# Patient Record
Sex: Male | Born: 1945
Health system: Southern US, Community
[De-identification: ages and names within clinical notes are randomized; demographics above are authoritative.]

## PROBLEM LIST (undated history)

## (undated) DIAGNOSIS — G629 Polyneuropathy, unspecified: Secondary | ICD-10-CM

## (undated) DIAGNOSIS — E7800 Pure hypercholesterolemia, unspecified: Secondary | ICD-10-CM

## (undated) DIAGNOSIS — R809 Proteinuria, unspecified: Secondary | ICD-10-CM

## (undated) DIAGNOSIS — E11319 Type 2 diabetes mellitus with unspecified diabetic retinopathy without macular edema: Secondary | ICD-10-CM

## (undated) DIAGNOSIS — E119 Type 2 diabetes mellitus without complications: Secondary | ICD-10-CM

## (undated) DIAGNOSIS — H269 Unspecified cataract: Secondary | ICD-10-CM

## (undated) DIAGNOSIS — K635 Polyp of colon: Secondary | ICD-10-CM

## (undated) DIAGNOSIS — E039 Hypothyroidism, unspecified: Secondary | ICD-10-CM

## (undated) DIAGNOSIS — I350 Nonrheumatic aortic (valve) stenosis: Secondary | ICD-10-CM

## (undated) DIAGNOSIS — N189 Chronic kidney disease, unspecified: Secondary | ICD-10-CM

## (undated) DIAGNOSIS — I10 Essential (primary) hypertension: Secondary | ICD-10-CM

## (undated) DIAGNOSIS — H35039 Hypertensive retinopathy, unspecified eye: Secondary | ICD-10-CM

## (undated) DIAGNOSIS — K219 Gastro-esophageal reflux disease without esophagitis: Secondary | ICD-10-CM

## (undated) HISTORY — DX: Chronic kidney disease, unspecified: N18.9

## (undated) HISTORY — DX: Pure hypercholesterolemia, unspecified: E78.00

## (undated) HISTORY — DX: Type 2 diabetes mellitus without complications: E11.9

## (undated) HISTORY — DX: Nonrheumatic aortic (valve) stenosis: I35.0

## (undated) HISTORY — DX: Hypertensive retinopathy, unspecified eye: H35.039

## (undated) HISTORY — DX: Hypothyroidism, unspecified: E03.9

## (undated) HISTORY — DX: Essential (primary) hypertension: I10

## (undated) HISTORY — DX: Polyneuropathy, unspecified: G62.9

## (undated) HISTORY — DX: Proteinuria, unspecified: R80.9

## (undated) HISTORY — DX: Type 2 diabetes mellitus with unspecified diabetic retinopathy without macular edema: E11.319

## (undated) HISTORY — DX: Unspecified cataract: H26.9

## (undated) HISTORY — DX: Gastro-esophageal reflux disease without esophagitis: K21.9

## (undated) HISTORY — PX: CARDIAC SURGERY: SHX584

## (undated) HISTORY — DX: Polyp of colon: K63.5

---

## 2002-03-20 ENCOUNTER — Ambulatory Visit (HOSPITAL_COMMUNITY): Admission: RE | Admit: 2002-03-20 | Discharge: 2002-03-20 | Payer: Self-pay | Admitting: Gastroenterology

## 2014-10-11 ENCOUNTER — Other Ambulatory Visit: Payer: Self-pay | Admitting: Nephrology

## 2014-10-11 DIAGNOSIS — R809 Proteinuria, unspecified: Secondary | ICD-10-CM

## 2014-10-15 ENCOUNTER — Other Ambulatory Visit: Payer: Self-pay

## 2014-10-18 ENCOUNTER — Ambulatory Visit
Admission: RE | Admit: 2014-10-18 | Discharge: 2014-10-18 | Disposition: A | Discharge status: Home / Self Care | Payer: Medicare Other | Source: Ambulatory Visit | Attending: Nephrology | Admitting: Nephrology

## 2014-10-18 DIAGNOSIS — R809 Proteinuria, unspecified: Secondary | ICD-10-CM

## 2015-03-15 ENCOUNTER — Other Ambulatory Visit: Payer: Self-pay | Admitting: Nephrology

## 2015-03-15 DIAGNOSIS — R809 Proteinuria, unspecified: Secondary | ICD-10-CM

## 2015-03-21 ENCOUNTER — Other Ambulatory Visit: Payer: Medicare Other

## 2016-08-19 DIAGNOSIS — E039 Hypothyroidism, unspecified: Secondary | ICD-10-CM | POA: Diagnosis not present

## 2016-10-01 DIAGNOSIS — E038 Other specified hypothyroidism: Secondary | ICD-10-CM | POA: Diagnosis not present

## 2016-10-14 DIAGNOSIS — M79645 Pain in left finger(s): Secondary | ICD-10-CM | POA: Diagnosis not present

## 2016-10-14 DIAGNOSIS — M65322 Trigger finger, left index finger: Secondary | ICD-10-CM | POA: Diagnosis not present

## 2016-11-17 ENCOUNTER — Encounter: Payer: Self-pay | Admitting: Neurology

## 2016-12-14 DIAGNOSIS — L821 Other seborrheic keratosis: Secondary | ICD-10-CM | POA: Diagnosis not present

## 2016-12-14 DIAGNOSIS — L814 Other melanin hyperpigmentation: Secondary | ICD-10-CM | POA: Diagnosis not present

## 2016-12-14 DIAGNOSIS — D225 Melanocytic nevi of trunk: Secondary | ICD-10-CM | POA: Diagnosis not present

## 2016-12-17 DIAGNOSIS — J069 Acute upper respiratory infection, unspecified: Secondary | ICD-10-CM | POA: Diagnosis not present

## 2017-01-15 ENCOUNTER — Ambulatory Visit (INDEPENDENT_AMBULATORY_CARE_PROVIDER_SITE_OTHER): Payer: PPO | Admitting: Neurology

## 2017-01-15 ENCOUNTER — Other Ambulatory Visit (INDEPENDENT_AMBULATORY_CARE_PROVIDER_SITE_OTHER): Payer: PPO

## 2017-01-15 ENCOUNTER — Encounter: Payer: Self-pay | Admitting: Neurology

## 2017-01-15 VITALS — BP 120/78 | HR 66 | Ht 69.0 in | Wt 220.0 lb

## 2017-01-15 DIAGNOSIS — E1142 Type 2 diabetes mellitus with diabetic polyneuropathy: Secondary | ICD-10-CM

## 2017-01-15 HISTORY — DX: Type 2 diabetes mellitus with diabetic polyneuropathy: E11.42

## 2017-01-15 LAB — FOLATE: Folate: >24 ng/mL (ref >5.9)

## 2017-01-15 LAB — VITAMIN B12: VITAMIN B 12: 389 pg/mL (ref 211–911)

## 2017-01-15 MED ORDER — PREGABALIN 200 MG PO CAPS: 200.0000 mg | ORAL_CAPSULE | Freq: Two times a day (BID) | ORAL | 5 refills | Status: DC

## 2017-01-15 NOTE — Patient Instructions (Signed)
Start Lyrica 200mg  twice daily Check labs   Return to clinic 6 months

## 2017-01-15 NOTE — Progress Notes (Signed)
[facility] Clinic Note - Initial Visit   Date: 01/15/17  Chad [name] MRN: [id number] DOB: [birth year]   Dear Dr. Delfina Redwood:  Thank you for your kind referral of Chad [name] [facility] for consultation of neuropathy. Although his history is well known to you, please allow Chad [name] to reiterate it for the purpose of our medical record. The patient was accompanied to the clinic by wife who also provides collateral information.     History of Present Illness: Chad [name] is a 71 y.o. right-handed Caucasian male with well-controlled diabetes mellitus, hypertension, hypothyroidism, and hyperlipidemia presenting for evaluation of bilateral foot paresthesias.    Starting around 2016, he starting having pins and needle sensation over the soles and toes.  Symptoms are constant and worse with walking.  He is not aware of symptoms during the day, but is most bothered by it in the morning and night time.  He denies any weakness or imbalance.  He walks unassisted and has not had any falls. He has NCS/EMG of the legs performed by Dr. Domingo Cocking in June 2017 which showed severe sensorimotor polyneuropathy.  He has previously tried gabapentin, but does not recall the dose.  He takes Lyrica 75mg  twice daily and denies any side effects.   He was diagnosed with diabetes in 2008 and has been managed with oral medications.  His last HbA1c 6.2.    There is no personal history of alcoholism or family history of neuropathy.    Out-side paper records, electronic medical record, and images have been reviewed where available and summarized as:  NCS/EMG of the legs 01/09/2016:  Severe sensory and motor polyneuropathy with chronic denervation consistent with patient's known history of diabetes mellitus.     Past Medical History:  Diagnosis Date  . CKD (chronic kidney disease)   . Colon polyps   . Diabetes ([place])   . GERD (gastroesophageal reflux disease)   . Hypercholesterolemia   . Hypertension   .  Polyneuropathy   . Proteinuria    Past Surgeries: None  Medications:  Outpatient Encounter Prescriptions as of 01/15/2017  Medication Sig  . glimepiride (AMARYL) 1 MG tablet   . hydrochlorothiazide (HYDRODIURIL) 25 MG tablet   . levothyroxine (SYNTHROID, LEVOTHROID) 75 MCG tablet   . lisinopril (PRINIVIL,ZESTRIL) 40 MG tablet TAKE 1 TABLET BY MOUTH DAILY  . metFORMIN (GLUCOPHAGE) 1000 MG tablet   . simvastatin (ZOCOR) 80 MG tablet   . [DISCONTINUED] levothyroxine (SYNTHROID) 75 MCG tablet 1 tablet  . [DISCONTINUED] LYRICA 150 MG capsule   . [DISCONTINUED] pregabalin (LYRICA) 150 MG capsule 1 capsule  . pregabalin (LYRICA) 200 MG capsule Take 1 capsule (200 mg total) by mouth 2 (two) times daily.  . [DISCONTINUED] aspirin 81 MG chewable tablet 1 tablet  . [DISCONTINUED] aspirin 81 MG chewable tablet 1 tablet  . [DISCONTINUED] azithromycin (ZITHROMAX) 250 MG tablet   . [DISCONTINUED] Calcium Carbonate-Vitamin D (CALCIUM 500 + D) 500-125 MG-UNIT TABS 1 tablet  . [DISCONTINUED] Calcium Carbonate-Vitamin D (CALCIUM 500 + D) 500-125 MG-UNIT TABS 1 tablet  . [DISCONTINUED] lisinopril (PRINIVIL,ZESTRIL) 40 MG tablet   . [DISCONTINUED] metFORMIN (GLUCOPHAGE) 1000 MG tablet 1 tablet  . [DISCONTINUED] vitamin C (ASCORBIC ACID) 500 MG tablet as directed   No facility-administered encounter medications on file as of 01/15/2017.      Allergies: No Known Allergies  Family History: Father died from stroke and had diabetes. Mother died of natural causes. Sister is healthy.  Social History:   Social History  Substance  Use Topics  . Smoking status: Former Research scientist (life sciences)  . Smokeless tobacco: Never Used  . Alcohol use Yes   Social History   Social History Narrative   Patient lives with wife in a 2 story home.  Has 2 children.  Works as a Associate Professor.  Education: college.     Review of Systems:  CONSTITUTIONAL: No fevers, chills, night sweats, or weight loss.   EYES: No visual  changes or eye pain ENT: No hearing changes.  No history of nose bleeds.   RESPIRATORY: No cough, wheezing and shortness of breath.   CARDIOVASCULAR: Negative for chest pain, and palpitations.   GI: Negative for abdominal discomfort, blood in stools or black stools.  No recent change in bowel habits.   GU:  No history of incontinence.   MUSCLOSKELETAL: No history of joint pain or swelling.  No myalgias.   SKIN: Negative for lesions, rash, and itching.   HEMATOLOGY/ONCOLOGY: Negative for prolonged bleeding, bruising easily, and swollen nodes.  No history of cancer.   ENDOCRINE: Negative for cold or heat intolerance, polydipsia or goiter.   PSYCH:  No depression or anxiety symptoms.   NEURO: As Above.   Vital Signs:  BP 120/78   Pulse 66   Ht 5\' 9"  (1.753 m)   Wt 220 lb (99.8 kg)   SpO2 97%   BMI 32.49 kg/m    General Medical Exam:   General:  Well appearing, comfortable.   Eyes/ENT: see cranial nerve examination.   Neck: No masses appreciated.  Full range of motion without tenderness.  No carotid bruits. Respiratory:  Clear to auscultation, good air entry bilaterally.   Cardiac:  Regular rate and rhythm, no murmur.   Extremities:  No deformities, edema, or skin discoloration.  Skin:  No rashes or lesions.  Neurological Exam: MENTAL STATUS including orientation to time, place, person, recent and remote memory, attention span and concentration, language, and fund of knowledge is normal.  Speech is not dysarthric.  CRANIAL NERVES: II:  No visual field defects.  Unremarkable fundi.   III-IV-VI: Pupils equal round and reactive to light.  Normal conjugate, extra-ocular eye movements in all directions of gaze.  No nystagmus.  No ptosis.   V:  Normal facial sensation.   VII:  Normal facial symmetry and movements.  VIII:  Normal hearing and vestibular function.   IX-X:  Normal palatal movement.   XI:  Normal shoulder shrug and head rotation.   XII:  Normal tongue strength and range  of motion, no deviation or fasciculation.  MOTOR:  No atrophy, fasciculations or abnormal movements.  No pronator drift.  Tone is normal.    Right Upper Extremity:    Left Upper Extremity:    Deltoid  5/5   Deltoid  5/5   Biceps  5/5   Biceps  5/5   Triceps  5/5   Triceps  5/5   Wrist extensors  5/5   Wrist extensors  5/5   Wrist flexors  5/5   Wrist flexors  5/5   Finger extensors  5/5   Finger extensors  5/5   Finger flexors  5/5   Finger flexors  5/5   Dorsal interossei  5/5   Dorsal interossei  5/5   Abductor pollicis  5/5   Abductor pollicis  5/5   Tone (Ashworth scale)  0  Tone (Ashworth scale)  0   Right Lower Extremity:    Left Lower Extremity:    Hip flexors  5/5  Hip flexors  5/5   Hip extensors  5/5   Hip extensors  5/5   Knee flexors  5/5   Knee flexors  5/5   Knee extensors  5/5   Knee extensors  5/5   Dorsiflexors  5/5   Dorsiflexors  5/5   Plantarflexors  5/5   Plantarflexors  5/5   Toe extensors  5/5   Toe extensors  5/5   Toe flexors  5/5   Toe flexors  5/5   Tone (Ashworth scale)  0  Tone (Ashworth scale)  0   MSRs: Reflexes are 1+/4 throughout and absent at the ankles.  Plantars are mute.  SENSORY:  Reduced temperature over the feet; vibration is trace at the ankles and absent at the great toe.  Proprioception and pin prick intact.  Romberg's sign shows very mild sway.   COORDINATION/GAIT: Normal finger-to- nose-finger.  Intact rapid alternating movements bilaterally.  Able to rise from a chair without using arms.  Gait narrow based and stable. Toe and tandem gait intact.   IMPRESSION: Peripheral neuropathy contributed by diabetes mellitus which is well-controlled. His neurological examination shows a distal predominant large fiber peripheral neuropathy. I had extensive discussion with the patient regarding the pathogenesis, etiology, management, and natural course of neuropathy. Neuropathy tends to be slowly progressive, even with well-controlled diabetes. To  be complete, I would like to test for other treatable causes of neuropathy. It was explained that management is symptomatic with pain control.  There is no associated weakness or imbalance.  He had many questions which I answered to the best of my ability.   - Increase Lyrica to 200mg  twice daily  - Check vitamin B12, copper, SPEP with IFE, folate  Return to clinic in 6 months.   The duration of this appointment visit was 45 minutes of face-to-face time with the patient.  Greater than 50% of this time was spent in counseling, explanation of diagnosis, planning of further management, and coordination of care.   Thank you for allowing me to participate in patient's care.  If I can answer any additional questions, I would be pleased to do so.    Sincerely,    Sue Fernicola K. Posey Pronto, DO

## 2017-01-19 LAB — PROTEIN ELECTROPHORESIS, SERUM
ALBUMIN ELP: 3.9 g/dL (ref 3.8–4.8)
ALPHA-2-GLOBULIN: 0.9 g/dL (ref 0.5–0.9)
Alpha-1-Globulin: 0.3 g/dL (ref 0.2–0.3)
BETA 2: 0.3 g/dL (ref 0.2–0.5)
BETA GLOBULIN: 0.4 g/dL (ref 0.4–0.6)
Gamma Globulin: 0.6 g/dL — ABNORMAL LOW (ref 0.8–1.7)
Total Protein, Serum Electrophoresis: 6.4 g/dL (ref 6.1–8.1)

## 2017-01-19 LAB — IMMUNOFIXATION ELECTROPHORESIS
IGM, SERUM: 42 mg/dL — AB (ref 48–271)
IgA: 110 mg/dL (ref 81–463)
IgG (Immunoglobin G), Serum: 648 mg/dL — ABNORMAL LOW (ref 694–1618)

## 2017-01-19 LAB — COPPER, SERUM: COPPER: 95 ug/dL (ref 70–175)

## 2017-02-02 DIAGNOSIS — Z125 Encounter for screening for malignant neoplasm of prostate: Secondary | ICD-10-CM | POA: Diagnosis not present

## 2017-02-02 DIAGNOSIS — E039 Hypothyroidism, unspecified: Secondary | ICD-10-CM | POA: Diagnosis not present

## 2017-02-02 DIAGNOSIS — E7800 Pure hypercholesterolemia, unspecified: Secondary | ICD-10-CM | POA: Diagnosis not present

## 2017-02-02 DIAGNOSIS — E0840 Diabetes mellitus due to underlying condition with diabetic neuropathy, unspecified: Secondary | ICD-10-CM | POA: Diagnosis not present

## 2017-02-02 DIAGNOSIS — Z0000 Encounter for general adult medical examination without abnormal findings: Secondary | ICD-10-CM | POA: Diagnosis not present

## 2017-02-02 DIAGNOSIS — E1140 Type 2 diabetes mellitus with diabetic neuropathy, unspecified: Secondary | ICD-10-CM | POA: Diagnosis not present

## 2017-02-02 DIAGNOSIS — N182 Chronic kidney disease, stage 2 (mild): Secondary | ICD-10-CM | POA: Diagnosis not present

## 2017-02-02 DIAGNOSIS — Z1389 Encounter for screening for other disorder: Secondary | ICD-10-CM | POA: Diagnosis not present

## 2017-02-02 DIAGNOSIS — I10 Essential (primary) hypertension: Secondary | ICD-10-CM | POA: Diagnosis not present

## 2017-07-08 NOTE — Progress Notes (Signed)
Follow-up Visit   Date: 07/09/17    Chad [name] MRN: [id number] DOB: 14-May-1946   [name] is a 71 y.o. right-handed Caucasian male with well-controlled diabetes mellitus, hypertension, hypothyroidism, and hyperlipidemia returning to the clinic for follow-up of neuropathy.  The patient was accompanied to the clinic by self.  History of present illness: Initial visit 12/2016:  Starting around 2016, he starting having pins and needle sensation over the soles and toes.  Symptoms are constant and worse with walking.  He is not aware of symptoms during the day, but is most bothered by it in the morning and night time.  He denies any weakness or imbalance.  He walks unassisted and has not had any falls. He has NCS/EMG of the legs performed by Dr. Domingo Cocking in June 2017 which showed severe sensorimotor polyneuropathy.  He has previously tried gabapentin, but does not recall the dose.  He takes Lyrica 75mg  twice daily and denies any side effects.   He was diagnosed with diabetes in 2008 and has been managed with oral medications.  His last HbA1c 6.2.    There is no personal history of alcoholism or family history of neuropathy.   UPDATE 07/09/2017:  He is here for follow-up visit and overall has been in good health since he was last here.  For his neuropathy, he was taking Lyrica 200mg  twice daily, but is only taking it once daily due to being in the donut hole.  Unfortunately, there has been no significant benefit with being on this dose and he continues to have tingling sensation of the feet. He is tolerating Lyrica well without any side effects.  He has tried about 9 sessions of accupuncture since he was last here and had transient benefit with this.  He tripped on his kitchen counter while changing the lightbulb, and otherwise has not had any falls.  He is heading to [facility] for the weekend with his friends.    Medications:  Current Outpatient Medications on File  Prior to Visit  Medication Sig Dispense Refill  . glimepiride (AMARYL) 1 MG tablet     . hydrochlorothiazide (HYDRODIURIL) 25 MG tablet     . levothyroxine (SYNTHROID, LEVOTHROID) 75 MCG tablet     . lisinopril (PRINIVIL,ZESTRIL) 40 MG tablet TAKE 1 TABLET BY MOUTH DAILY    . metFORMIN (GLUCOPHAGE) 1000 MG tablet     . pregabalin (LYRICA) 200 MG capsule Take 1 capsule (200 mg total) by mouth 2 (two) times daily. 60 capsule 5  . simvastatin (ZOCOR) 80 MG tablet      No current facility-administered medications on file prior to visit.     Allergies: No Known Allergies  Review of Systems:  CONSTITUTIONAL: No fevers, chills, night sweats, or weight loss.  EYES: No visual changes or eye pain ENT: No hearing changes.  No history of nose bleeds.   RESPIRATORY: No cough, wheezing and shortness of breath.   CARDIOVASCULAR: Negative for chest pain, and palpitations.   GI: Negative for abdominal discomfort, blood in stools or black stools.  No recent change in bowel habits.   GU:  No history of incontinence.   MUSCLOSKELETAL: No history of joint pain or swelling.  No myalgias.   SKIN: Negative for lesions, rash, and itching.   ENDOCRINE: Negative for cold or heat intolerance, polydipsia or goiter.   PSYCH:  No depression or anxiety symptoms.   NEURO: As Above.   Vital Signs:  BP 128/78  Pulse 80   Ht 5\' 9"  (1.753 m)   Wt 219 lb 2 oz (99.4 kg)   SpO2 98%   BMI 32.36 kg/m   General: Well appearing, comfortable  Neurological Exam: MENTAL STATUS including orientation to time, place, person, recent and remote memory, attention span and concentration, language, and fund of knowledge is normal.  Speech is not dysarthric.  CRANIAL NERVES:  Pupils equal round and reactive to light.  Normal conjugate, extra-ocular eye movements in all directions of gaze.    Face is symmetric.   MOTOR:  Motor strength is 5/5 in all extremities.  No pronator drift.  Tone is normal.    MSRs:  Reflexes are  2+/4 throughout, except absent Achilles.  SENSORY:  Reduced vibration distal to ankles bilaterally.  Rhomberg is negative.  COORDINATION/GAIT:  Normal finger-to- nose-finger.  Gait narrow based and stable, unassisted.  Tandem gait intact.  Data: NCS/EMG of the legs 01/09/2016 performed elsewhere:  Severe sensory and motor polyneuropathy with chronic denervation consistent with patient's known history of diabetes mellitus.   Labs 12/2016:  vitamin B12 298, copper 95, SPEP with IFE A poorly-defined area of restricted protein mobility is detected and is reactive with IgG and Kappa antisera, folate >24.0  IMPRESSION/PLAN: Painful peripheral neuropathy contributed by diabetes mellitus, despite being well-controlled. Clinically, he continues to have painful paresthesias, no evidence of weakness, sensory ataxia, or imbalance. He has previously tried gabapentin without any benefit.      Recheck SPEP and UPEP with IFE due to faint band at IgG and Kappa on last SPEP, although explained that my overall suspicion for paraproteinemia is low.   Continue Lyrica 200 mg twice daily.  Samples for 50mg  and 75mg  provided to adjust dose of Lyrica to offset him being in the donut hole.  Check EKG for QTc interval, if we need to try TCA going forward  Patient instructed to send me a MyChart message with an update in 4-6 weeks.  Return to clinic in 6 months  Greater than 50% of this 30 minute visit was spent in counseling, explanation of diagnosis, planning of further management, and coordination of care.   Thank you for allowing me to participate in patient's care.  If I can answer any additional questions, I would be pleased to do so.    Sincerely,    Aneira Cavitt K. Posey Pronto, DO

## 2017-07-09 ENCOUNTER — Encounter: Payer: Self-pay | Admitting: Neurology

## 2017-07-09 ENCOUNTER — Ambulatory Visit (INDEPENDENT_AMBULATORY_CARE_PROVIDER_SITE_OTHER): Payer: PPO | Admitting: Neurology

## 2017-07-09 ENCOUNTER — Other Ambulatory Visit: Payer: PPO

## 2017-07-09 VITALS — BP 128/78 | HR 80 | Ht 69.0 in | Wt 219.1 lb

## 2017-07-09 DIAGNOSIS — E1142 Type 2 diabetes mellitus with diabetic polyneuropathy: Secondary | ICD-10-CM | POA: Diagnosis not present

## 2017-07-09 DIAGNOSIS — Z79899 Other long term (current) drug therapy: Secondary | ICD-10-CM | POA: Diagnosis not present

## 2017-07-09 MED ORDER — PREGABALIN 50 MG PO CAPS: 50.0000 mg | ORAL_CAPSULE | Freq: Two times a day (BID) | ORAL | 2 refills | Status: DC

## 2017-07-09 MED ORDER — PREGABALIN 75 MG PO CAPS
75.0000 mg | ORAL_CAPSULE | Freq: Two times a day (BID) | ORAL | 0 refills | Status: DC
Start: 2017-07-09 — End: 2017-09-27

## 2017-07-09 NOTE — Addendum Note (Signed)
Addended by: Chester Holstein on: 07/09/2017 10:34 AM   Modules accepted: Orders

## 2017-07-09 NOTE — Patient Instructions (Addendum)
Check EKG Check labs Continue Lyrica 200mg  twice daily Send me a MyChart message in 4-6 weeks with an update.  Please go to [email or web address] to log in to your account  Return to clinic in 6 months

## 2017-07-09 NOTE — Addendum Note (Signed)
Addended by: Chester Holstein on: 07/09/2017 10:38 AM   Modules accepted: Orders

## 2017-07-13 ENCOUNTER — Encounter: Payer: Self-pay | Admitting: Neurology

## 2017-07-14 LAB — PROTEIN ELECTROPHORESIS, SERUM
ALPHA 1: 0.3 g/dL (ref 0.2–0.3)
ALPHA 2: 0.9 g/dL (ref 0.5–0.9)
Abnormal Protein Band1: 0.3 g/dL — ABNORMAL HIGH
Albumin ELP: 3.9 g/dL (ref 3.8–4.8)
Beta 2: 0.3 g/dL (ref 0.2–0.5)
Beta Globulin: 0.4 g/dL (ref 0.4–0.6)
Gamma Globulin: 0.6 g/dL — ABNORMAL LOW (ref 0.8–1.7)
Total Protein: 6.5 g/dL (ref 6.1–8.1)

## 2017-07-14 LAB — PROTEIN ELECTROPHORESIS,RANDOM URN
ALBUMIN UR 24 HR ELECTRO: 77 %
ALPHA-1-GLOBULIN, U: 2 %
Alpha-2-Globulin, U: 5 %
Beta Globulin, U: 10 %
Creatinine, Urine: 86 mg/dL (ref 20–320)
Gamma Globulin, U: 6 %
PROTEIN/CREAT RATIO: 349 mg/g{creat} — AB (ref 22–128)
Total Protein, Urine: 30 mg/dL — ABNORMAL HIGH (ref 5–25)

## 2017-07-14 LAB — IMMUNOFIXATION ELECTROPHORESIS
IMMUNOGLOBULIN A: 120 mg/dL (ref 81–463)
IgG (Immunoglobin G), Serum: 636 mg/dL — ABNORMAL LOW (ref 694–1618)
IgM, Serum: 41 mg/dL — ABNORMAL LOW (ref 48–271)

## 2017-07-14 LAB — IMMUNOFIXATION INTE

## 2017-08-04 DIAGNOSIS — N182 Chronic kidney disease, stage 2 (mild): Secondary | ICD-10-CM | POA: Diagnosis not present

## 2017-08-04 DIAGNOSIS — E1140 Type 2 diabetes mellitus with diabetic neuropathy, unspecified: Secondary | ICD-10-CM | POA: Diagnosis not present

## 2017-08-04 DIAGNOSIS — E039 Hypothyroidism, unspecified: Secondary | ICD-10-CM | POA: Diagnosis not present

## 2017-08-04 DIAGNOSIS — Z7984 Long term (current) use of oral hypoglycemic drugs: Secondary | ICD-10-CM | POA: Diagnosis not present

## 2017-08-04 DIAGNOSIS — E7800 Pure hypercholesterolemia, unspecified: Secondary | ICD-10-CM | POA: Diagnosis not present

## 2017-08-11 ENCOUNTER — Telehealth: Payer: Self-pay

## 2017-08-11 NOTE — Telephone Encounter (Signed)
Requested notes from [place], Dr. Lina Sar office.

## 2017-08-20 DIAGNOSIS — E119 Type 2 diabetes mellitus without complications: Secondary | ICD-10-CM | POA: Diagnosis not present

## 2017-08-24 NOTE — Progress Notes (Signed)
Cardiology Office Note   Date:  08/27/2017   ID:  Chad [name], DOB 1946/01/18, MRN [id number]  PCP:  Seward Carol, MD  Cardiologist:   Jenkins Rouge, MD   No chief complaint on file.     History of Present Illness: Chad [name] is a 72 y.o. male who presents for consultation regarding CAD risk.  Referred by Dr Delfina Redwood Reviewed his office note from 08/04/17 History of DM, HTN, hypothyroidism and HLD Has polyneuropathy especially feet not helped with gabapentin. On lyrica as well Been diabetic for 11 years on oral meds last A1c 6.2 Neurologist considering starting TCA for his neuropathy. Needs QT checked   Labs LDL 71 A1c 7.0 Cr 1.1 K 4.4   His activity is limited by neuropathy in feet No chest pain Exertional dyspnea No palpitations or dypspnea Wife indicates history of murmur patient not aware. Has not had echo or stress testing in over a decade    Past Medical History:  Diagnosis Date  . CKD (chronic kidney disease)   . Colon polyps   . Diabetes ([place])   . GERD (gastroesophageal reflux disease)   . Hypercholesterolemia   . Hypertension   . Hypothyroidism   . Polyneuropathy   . Proteinuria     History reviewed. No pertinent surgical history.   Current Outpatient Medications  Medication Sig Dispense Refill  . glimepiride (AMARYL) 1 MG tablet     . hydrochlorothiazide (HYDRODIURIL) 25 MG tablet     . levothyroxine (SYNTHROID, LEVOTHROID) 75 MCG tablet     . lisinopril (PRINIVIL,ZESTRIL) 40 MG tablet TAKE 1 TABLET BY MOUTH DAILY    . metFORMIN (GLUCOPHAGE) 1000 MG tablet     . pregabalin (LYRICA) 200 MG capsule Take 1 capsule (200 mg total) by mouth 2 (two) times daily. 60 capsule 5  . pregabalin (LYRICA) 50 MG capsule Take 1 capsule (50 mg total) by mouth 2 (two) times daily. Lot: [id number] Exp: 04-2019 90 capsule 2  . pregabalin (LYRICA) 75 MG capsule Take 1 capsule (75 mg total) by mouth 2 (two) times daily. 20 capsule 0  . simvastatin (ZOCOR) 80 MG tablet      No  current facility-administered medications for this visit.     Allergies:   Patient has no known allergies.    Social History:  The patient  reports that he has quit smoking. he has never used smokeless tobacco. He reports that he drinks alcohol. He reports that he does not use drugs.   Family History:  The patient's family history is not on file.    ROS:  Please see the history of present illness.   Otherwise, review of systems are positive for none.   All other systems are reviewed and negative.    PHYSICAL EXAM: VS:  There were no vitals taken for this visit. , BMI There is no height or weight on file to calculate BMI. Affect appropriate Healthy:  appears stated age 6: normal Neck supple with no adenopathy JVP normal no bruits no thyromegaly Lungs clear with no wheezing and good diaphragmatic motion Heart:  S1/S2 no murmur, no rub, gallop or click PMI normal Abdomen: benighn, BS positve, no tenderness, no AAA no bruit.  No HSM or HJR Distal pulses intact with no bruits No edema Neuro non-focal Skin warm and dry No muscular weakness    EKG:  NSR normal ECG including QT    Recent Labs: No results found for requested labs within last 8760 hours.  Lipid Panel No results found for: CHOL, TRIG, HDL, CHOLHDL, VLDL, LDLCALC, LDLDIRECT    Wt Readings from Last 3 Encounters:  07/09/17 219 lb 2 oz (99.4 kg)  01/15/17 220 lb (99.8 kg)      Other studies Reviewed: Additional studies/ records that were reviewed today include: Office notes neurology and primary .    ASSESSMENT AND PLAN:  1.  HTN Well controlled.  Continue current medications and low sodium Dash type diet.   2. DM Discussed low carb diet.  Target hemoglobin A1c is 6.5 or less.  Continue current medications. 3. HLD on statin labs with primary  4. Neuropathy f/u neuro encouraged him to get head CT to r/o NPH or other pathology  5. Thyroid on replacement TSH normal  6. CAD:  Risk given age and long  standing DM with secondary end organ damge. ECG is normal including QT so ok to use TCA for Rx neuropathy. Will order calcium score and ETT to r/o CAD 7. Murmur: AV sclerosis murmur will order echo    Current medicines are reviewed at length with the patient today.  The patient does not have concerns regarding medicines.  The following changes have been made:  no change  Labs/ tests ordered today include: Calcium score, Echo and ETT  No orders of the defined types were placed in this encounter.    Disposition:   FU with cardiology PRN      Signed, Jenkins Rouge, MD  08/27/2017 8:30 AM    [address], [place], [place]  [address] Phone: [phone]; Fax: [phone]

## 2017-08-26 ENCOUNTER — Encounter: Payer: Self-pay | Admitting: Cardiovascular Disease

## 2017-08-27 ENCOUNTER — Encounter: Payer: Self-pay | Admitting: Cardiovascular Disease

## 2017-08-27 ENCOUNTER — Ambulatory Visit: Payer: PPO | Admitting: Cardiovascular Disease

## 2017-08-27 ENCOUNTER — Ambulatory Visit (INDEPENDENT_AMBULATORY_CARE_PROVIDER_SITE_OTHER)
Admission: RE | Admit: 2017-08-27 | Discharge: 2017-08-27 | Disposition: A | Discharge status: Home / Self Care | Payer: Self-pay | Source: Ambulatory Visit | Attending: Cardiovascular Disease | Admitting: Cardiovascular Disease

## 2017-08-27 VITALS — BP 152/74 | HR 66 | Ht 68.0 in | Wt 221.2 lb

## 2017-08-27 DIAGNOSIS — I2510 Atherosclerotic heart disease of native coronary artery without angina pectoris: Secondary | ICD-10-CM

## 2017-08-27 DIAGNOSIS — E785 Hyperlipidemia, unspecified: Secondary | ICD-10-CM | POA: Diagnosis not present

## 2017-08-27 DIAGNOSIS — R011 Cardiac murmur, unspecified: Secondary | ICD-10-CM

## 2017-08-27 DIAGNOSIS — I10 Essential (primary) hypertension: Secondary | ICD-10-CM

## 2017-08-27 NOTE — Patient Instructions (Addendum)
Medication Instructions:  Your physician recommends that you continue on your current medications as directed. Please refer to the Current Medication list given to you today.  Labwork: NONE  Testing/Procedures: Cardiac CT scanning for calcium score today, (CAT scanning), is a noninvasive, special x-ray that produces cross-sectional images of the body using x-rays and a computer.   Your physician has requested that you have an echocardiogram. Echocardiography is a painless test that uses sound waves to create images of your heart. It provides your doctor with information about the size and shape of your heart and how well your heart's chambers and valves are working. This procedure takes approximately one hour. There are no restrictions for this procedure.  Your physician has requested that you have an exercise tolerance test. For further information please visit [email or web address]. Please also follow instruction sheet, as given.  Follow-Up: Your physician wants you to follow-up as needed with Dr. Johnsie Cancel.   If you need a refill on your cardiac medications before your next appointment, please call your pharmacy.

## 2017-09-03 ENCOUNTER — Ambulatory Visit (INDEPENDENT_AMBULATORY_CARE_PROVIDER_SITE_OTHER): Payer: PPO

## 2017-09-03 ENCOUNTER — Other Ambulatory Visit: Payer: Self-pay

## 2017-09-03 ENCOUNTER — Ambulatory Visit (HOSPITAL_COMMUNITY): Payer: PPO | Attending: Cardiovascular Disease

## 2017-09-03 DIAGNOSIS — R011 Cardiac murmur, unspecified: Secondary | ICD-10-CM | POA: Diagnosis not present

## 2017-09-03 DIAGNOSIS — I2510 Atherosclerotic heart disease of native coronary artery without angina pectoris: Secondary | ICD-10-CM

## 2017-09-03 DIAGNOSIS — E785 Hyperlipidemia, unspecified: Secondary | ICD-10-CM | POA: Insufficient documentation

## 2017-09-03 DIAGNOSIS — I10 Essential (primary) hypertension: Secondary | ICD-10-CM | POA: Insufficient documentation

## 2017-09-03 LAB — EXERCISE TOLERANCE TEST
CHL CUP MPHR: 150 {beats}/min
CHL CUP RESTING HR STRESS: 64 {beats}/min
CSEPEDS: 44 s
CSEPEW: 6.6 METS
CSEPHR: 73 %
CSEPPHR: 110 {beats}/min
Exercise duration (min): 4 min
RPE: 15

## 2017-09-06 ENCOUNTER — Encounter: Payer: Self-pay | Admitting: Neurology

## 2017-09-09 ENCOUNTER — Other Ambulatory Visit: Payer: Self-pay | Admitting: *Deleted

## 2017-09-09 MED ORDER — NORTRIPTYLINE HCL 10 MG PO CAPS: 10.0000 mg | ORAL_CAPSULE | Freq: Every day | ORAL | 3 refills | Status: DC

## 2017-09-15 ENCOUNTER — Telehealth: Payer: Self-pay

## 2017-09-15 DIAGNOSIS — I350 Nonrheumatic aortic (valve) stenosis: Secondary | ICD-10-CM

## 2017-09-15 NOTE — Telephone Encounter (Signed)
-----   Message from Josue Hector, MD sent at 09/03/2017  5:02 PM EST ----- EF normal mild AS f/u echo in a year

## 2017-09-15 NOTE — Telephone Encounter (Signed)
Order in for follow-up echo in one year. Patient aware of echo results.

## 2017-09-25 ENCOUNTER — Encounter: Payer: Self-pay | Admitting: Neurology

## 2017-09-27 ENCOUNTER — Encounter: Payer: Self-pay | Admitting: Neurology

## 2017-09-27 ENCOUNTER — Other Ambulatory Visit: Payer: Self-pay | Admitting: Neurology

## 2017-09-27 ENCOUNTER — Other Ambulatory Visit: Payer: Self-pay | Admitting: *Deleted

## 2017-09-27 MED ORDER — PREGABALIN 200 MG PO CAPS: 200.0000 mg | ORAL_CAPSULE | Freq: Two times a day (BID) | ORAL | 5 refills | Status: DC

## 2017-09-27 MED ORDER — NORTRIPTYLINE HCL 10 MG PO CAPS: 20.0000 mg | ORAL_CAPSULE | Freq: Every day | ORAL | 3 refills | Status: DC

## 2017-10-03 ENCOUNTER — Encounter: Payer: Self-pay | Admitting: Neurology

## 2017-12-10 DIAGNOSIS — J069 Acute upper respiratory infection, unspecified: Secondary | ICD-10-CM | POA: Diagnosis not present

## 2017-12-10 DIAGNOSIS — R5383 Other fatigue: Secondary | ICD-10-CM | POA: Diagnosis not present

## 2017-12-10 DIAGNOSIS — J029 Acute pharyngitis, unspecified: Secondary | ICD-10-CM | POA: Diagnosis not present

## 2017-12-14 DIAGNOSIS — L821 Other seborrheic keratosis: Secondary | ICD-10-CM | POA: Diagnosis not present

## 2017-12-14 DIAGNOSIS — D1801 Hemangioma of skin and subcutaneous tissue: Secondary | ICD-10-CM | POA: Diagnosis not present

## 2017-12-14 DIAGNOSIS — L814 Other melanin hyperpigmentation: Secondary | ICD-10-CM | POA: Diagnosis not present

## 2017-12-14 DIAGNOSIS — D229 Melanocytic nevi, unspecified: Secondary | ICD-10-CM | POA: Diagnosis not present

## 2018-01-07 ENCOUNTER — Ambulatory Visit: Payer: PPO | Admitting: Neurology

## 2018-01-19 ENCOUNTER — Other Ambulatory Visit: Payer: PPO

## 2018-01-19 ENCOUNTER — Encounter: Payer: Self-pay | Admitting: Neurology

## 2018-01-19 ENCOUNTER — Ambulatory Visit (INDEPENDENT_AMBULATORY_CARE_PROVIDER_SITE_OTHER): Payer: PPO | Admitting: Neurology

## 2018-01-19 VITALS — BP 138/70 | HR 77 | Ht 68.0 in | Wt 220.0 lb

## 2018-01-19 DIAGNOSIS — Z79899 Other long term (current) drug therapy: Secondary | ICD-10-CM | POA: Diagnosis not present

## 2018-01-19 DIAGNOSIS — E1142 Type 2 diabetes mellitus with diabetic polyneuropathy: Secondary | ICD-10-CM

## 2018-01-19 MED ORDER — PREGABALIN 75 MG PO CAPS: 75.0000 mg | ORAL_CAPSULE | Freq: Two times a day (BID) | ORAL | 0 refills | Status: DC

## 2018-01-19 MED ORDER — GABAPENTIN 300 MG PO CAPS: 300.0000 mg | ORAL_CAPSULE | Freq: Three times a day (TID) | ORAL | 5 refills | Status: DC

## 2018-01-19 NOTE — Patient Instructions (Addendum)
Increase nortriptyline 30mg  at bedtime   Start to take gabapentin and reduce Lyrica as follows:  Gabapentin 300 mg tablets    Morning       Afternoon        Evening  Week 1                                  1 tab              Week 2 1 tab                   1 tab              Continue  1 tab          1 tab            1 tab         As you are doing this, also make changes to Lyrica as follows:       Lyrica  Morning Afternoon Evening  Week 1 200mg     75mg   Week 2 75mg   75mg   75mg   Week 3 75mg     75mg   Week 4 0    75mg , then stop   Send me a MyChart message in 4-6 weeks with an update  Return to clinic in 6 months

## 2018-01-19 NOTE — Progress Notes (Signed)
Follow-up Visit   Date: 01/19/18    Chad [name] MRN: [id number] DOB: May 12, 1946   [name] is a 72 y.o. right-handed Caucasian male with well-controlled diabetes mellitus, hypertension, hypothyroidism, and hyperlipidemia returning to the clinic for follow-up of neuropathy.  The patient was accompanied to the clinic by self.  History of present illness: Initial visit 12/2016:  Starting around 2016, he starting having pins and needle sensation over the soles and toes.  Symptoms are constant and worse with walking.  He is not aware of symptoms during the day, but is most bothered by it in the morning and night time.  He denies any weakness or imbalance.  He walks unassisted and has not had any falls. He has NCS/EMG of the legs performed by Dr. Domingo Cocking in June 2017 which showed severe sensorimotor polyneuropathy.  He has previously tried gabapentin, but does not recall the dose.  He takes Lyrica 75mg  twice daily and denies any side effects.   He was diagnosed with diabetes in 2008 and has been managed with oral medications.  His last HbA1c 6.2.    There is no personal history of alcoholism or family history of neuropathy.   UPDATE 07/09/2017:  He is here for follow-up visit and overall has been in good health since he was last here.  For his neuropathy, he was taking Lyrica 200mg  twice daily, but is only taking it once daily due to being in the donut hole.  Unfortunately, there has been no significant benefit with being on this dose and he continues to have tingling sensation of the feet. He is tolerating Lyrica well without any side effects.  He has tried about 9 sessions of accupuncture since he was last here and had transient benefit with this.  He tripped on his kitchen counter while changing the lightbulb, and otherwise has not had any falls.  He is heading to [facility] for the weekend with his friends.    UPDATE 01/19/2018:  He is here for 6 month follow-up  visit.  He continues to have pins and needle sensation of the right foot which is constant.  The shooting pain in the feet has significantly subsided, since starting nortriptyline.  Because his pain is exacerbated when his foot hits the floor when walking, he has started to use a stationary bike for exercise.  He has no suffered any falls or hospitalizations.  He takes Lyrica 200mg  twice daily and nortriptyline 20mg  at bedtime.  He tells me that he will be in the donut hole in August and Lyrica will cost of $1500 so would like to explore other options.  He has some constipation which is alleviated with miralax.    Medications:  Current Outpatient Medications on File Prior to Visit  Medication Sig Dispense Refill  . glimepiride (AMARYL) 1 MG tablet     . hydrochlorothiazide (HYDRODIURIL) 25 MG tablet     . levothyroxine (SYNTHROID, LEVOTHROID) 75 MCG tablet     . lisinopril (PRINIVIL,ZESTRIL) 40 MG tablet TAKE 1 TABLET BY MOUTH DAILY    . metFORMIN (GLUCOPHAGE) 1000 MG tablet     . nortriptyline (PAMELOR) 10 MG capsule Take 2 capsules (20 mg total) by mouth at bedtime. 180 capsule 3  . pregabalin (LYRICA) 200 MG capsule Take 1 capsule (200 mg total) by mouth 2 (two) times daily. 60 capsule 5  . simvastatin (ZOCOR) 80 MG tablet      No current facility-administered medications on file prior to  visit.     Allergies: No Known Allergies  Review of Systems:  CONSTITUTIONAL: No fevers, chills, night sweats, or weight loss.  EYES: No visual changes or eye pain ENT: No hearing changes.  No history of nose bleeds.   RESPIRATORY: No cough, wheezing and shortness of breath.   CARDIOVASCULAR: Negative for chest pain, and palpitations.   GI: Negative for abdominal discomfort, blood in stools or black stools.  No recent change in bowel habits.   GU:  No history of incontinence.   MUSCLOSKELETAL: No history of joint pain or swelling.  No myalgias.   SKIN: Negative for lesions, rash, and itching.     ENDOCRINE: Negative for cold or heat intolerance, polydipsia or goiter.   PSYCH:  No depression or anxiety symptoms.   NEURO: As Above.   Vital Signs:  BP 138/70   Pulse 77   Ht 5\' 8"  (1.727 m)   Wt 220 lb (99.8 kg)   SpO2 96%   BMI 33.45 kg/m   General Medical Exam:   General:  Well appearing, comfortable  Eyes/ENT: see cranial nerve examination.   Neck: No masses appreciated.  Full range of motion without tenderness.  No carotid bruits. Respiratory:  Clear to auscultation, good air entry bilaterally.   Cardiac:  Regular rate and rhythm, there is a systolic murmur.   Ext:  No edema  Neurological Exam: MENTAL STATUS including orientation to time, place, person, recent and remote memory, attention span and concentration, language, and fund of knowledge is normal.  Speech is not dysarthric.  CRANIAL NERVES:  Pupils equal round and reactive to light.  Normal conjugate, extra-ocular eye movements in all directions of gaze.    Face is symmetric.   MOTOR:  Motor strength is 5/5 in all extremities.  No pronator drift.  Tone is normal.    MSRs:  Reflexes are 2+/4 throughout, except absent Achilles.  SENSORY:  Reduced vibration distal to ankles bilaterally.  Rhomberg is negative.  COORDINATION/GAIT:  Normal finger-to- nose-finger.  Gait narrow based and stable, unassisted.  Tandem gait intact.  DATA: NCS/EMG of the legs 01/09/2016 performed elsewhere:  Severe sensory and motor polyneuropathy with chronic denervation consistent with patient's known history of diabetes mellitus.   Labs 12/2016:  vitamin B12 298, copper 95, SPEP with IFE A poorly-defined area of restricted protein mobility is detected and is reactive with IgG and Kappa antisera, folate >24.0   IMPRESSION/PLAN: Painful peripheral neuropathy contributed by diabetes mellitus.  He continues to have painful paresthesias without evidence of weakness, sensory ataxia, or imbalance.  He will be in the donut hole by August and  Lyrica will cost him $1500, so I will transition him to gabapentin and see if we can control his pain on generic medication Start gabapentin 300mg  at bedtime and increase to 300mg  TID over 3 weeks. As his gabapentin is being titrated, he will taper Lyrica slowly (schedule provided in AVS) Increase nortriptyline to 30mg  at bedtime  SPEP and UPEP with IFE, which continues to show faint IgG kappa monoclonal protein  Patient instructed to send me a MyChart message with an update in 4-6 weeks.  Return to clinic in 6 months  Greater than 50% of this 35 minute visit was spent in counseling, explanation of diagnosis, planning of further management, and coordination of care.   Thank you for allowing me to participate in patient's care.  If I can answer any additional questions, I would be pleased to do so.    Sincerely,  Jani Ploeger K. Posey Pronto, DO

## 2018-01-21 ENCOUNTER — Encounter: Payer: Self-pay | Admitting: Neurology

## 2018-01-21 LAB — IMMUNOFIXATION ELECTROPHORESIS
IgG (Immunoglobin G), Serum: 655 mg/dL — ABNORMAL LOW (ref 694–1618)
IgM, Serum: 37 mg/dL — ABNORMAL LOW (ref 48–271)
Immunoglobulin A: 115 mg/dL (ref 81–463)

## 2018-01-21 LAB — PROTEIN ELECTROPHORESIS, SERUM
ALBUMIN ELP: 3.9 g/dL (ref 3.8–4.8)
ALPHA 1: 0.3 g/dL (ref 0.2–0.3)
ALPHA 2: 0.9 g/dL (ref 0.5–0.9)
BETA 2: 0.3 g/dL (ref 0.2–0.5)
BETA GLOBULIN: 0.4 g/dL (ref 0.4–0.6)
GAMMA GLOBULIN: 0.6 g/dL — AB (ref 0.8–1.7)
Total Protein: 6.5 g/dL (ref 6.1–8.1)

## 2018-01-21 LAB — PROTEIN ELECTROPHORESIS,RANDOM URN
ALPHA-2-GLOBULIN, U: 4 %
Albumin: 79 %
Alpha-1-Globulin, U: 4 %
Beta Globulin, U: 8 %
Creatinine, Urine: 80 mg/dL (ref 20–320)
Gamma Globulin, U: 5 %
Protein/Creat Ratio: 363 mg/g creat — ABNORMAL HIGH (ref 22–128)
Total Protein, Urine: 29 mg/dL — ABNORMAL HIGH (ref 5–25)

## 2018-01-21 LAB — IMMUNOFIXATION INTE

## 2018-01-21 LAB — EXTRA URINE SPECIMEN

## 2018-02-23 DIAGNOSIS — I10 Essential (primary) hypertension: Secondary | ICD-10-CM | POA: Diagnosis not present

## 2018-02-23 DIAGNOSIS — E1165 Type 2 diabetes mellitus with hyperglycemia: Secondary | ICD-10-CM | POA: Diagnosis not present

## 2018-02-23 DIAGNOSIS — N182 Chronic kidney disease, stage 2 (mild): Secondary | ICD-10-CM | POA: Diagnosis not present

## 2018-02-23 DIAGNOSIS — E1140 Type 2 diabetes mellitus with diabetic neuropathy, unspecified: Secondary | ICD-10-CM | POA: Diagnosis not present

## 2018-02-23 DIAGNOSIS — E785 Hyperlipidemia, unspecified: Secondary | ICD-10-CM | POA: Diagnosis not present

## 2018-02-23 DIAGNOSIS — E039 Hypothyroidism, unspecified: Secondary | ICD-10-CM | POA: Diagnosis not present

## 2018-02-23 DIAGNOSIS — E1129 Type 2 diabetes mellitus with other diabetic kidney complication: Secondary | ICD-10-CM | POA: Diagnosis not present

## 2018-02-24 DIAGNOSIS — E039 Hypothyroidism, unspecified: Secondary | ICD-10-CM | POA: Diagnosis not present

## 2018-02-24 DIAGNOSIS — E1140 Type 2 diabetes mellitus with diabetic neuropathy, unspecified: Secondary | ICD-10-CM | POA: Diagnosis not present

## 2018-02-24 DIAGNOSIS — I350 Nonrheumatic aortic (valve) stenosis: Secondary | ICD-10-CM | POA: Diagnosis not present

## 2018-02-24 DIAGNOSIS — Z0000 Encounter for general adult medical examination without abnormal findings: Secondary | ICD-10-CM | POA: Diagnosis not present

## 2018-02-24 DIAGNOSIS — E1129 Type 2 diabetes mellitus with other diabetic kidney complication: Secondary | ICD-10-CM | POA: Diagnosis not present

## 2018-02-24 DIAGNOSIS — E663 Overweight: Secondary | ICD-10-CM | POA: Diagnosis not present

## 2018-02-24 DIAGNOSIS — R809 Proteinuria, unspecified: Secondary | ICD-10-CM | POA: Diagnosis not present

## 2018-02-24 DIAGNOSIS — Z1389 Encounter for screening for other disorder: Secondary | ICD-10-CM | POA: Diagnosis not present

## 2018-02-24 DIAGNOSIS — Z125 Encounter for screening for malignant neoplasm of prostate: Secondary | ICD-10-CM | POA: Diagnosis not present

## 2018-02-24 DIAGNOSIS — I10 Essential (primary) hypertension: Secondary | ICD-10-CM | POA: Diagnosis not present

## 2018-02-24 DIAGNOSIS — E7800 Pure hypercholesterolemia, unspecified: Secondary | ICD-10-CM | POA: Diagnosis not present

## 2018-02-24 DIAGNOSIS — N182 Chronic kidney disease, stage 2 (mild): Secondary | ICD-10-CM | POA: Diagnosis not present

## 2018-02-25 DIAGNOSIS — E871 Hypo-osmolality and hyponatremia: Secondary | ICD-10-CM | POA: Diagnosis not present

## 2018-02-26 DIAGNOSIS — J029 Acute pharyngitis, unspecified: Secondary | ICD-10-CM | POA: Diagnosis not present

## 2018-02-26 DIAGNOSIS — J069 Acute upper respiratory infection, unspecified: Secondary | ICD-10-CM | POA: Diagnosis not present

## 2018-02-28 DIAGNOSIS — E785 Hyperlipidemia, unspecified: Secondary | ICD-10-CM | POA: Diagnosis not present

## 2018-02-28 DIAGNOSIS — Z7984 Long term (current) use of oral hypoglycemic drugs: Secondary | ICD-10-CM | POA: Diagnosis not present

## 2018-02-28 DIAGNOSIS — E1121 Type 2 diabetes mellitus with diabetic nephropathy: Secondary | ICD-10-CM | POA: Diagnosis not present

## 2018-02-28 DIAGNOSIS — E039 Hypothyroidism, unspecified: Secondary | ICD-10-CM | POA: Diagnosis not present

## 2018-02-28 DIAGNOSIS — I10 Essential (primary) hypertension: Secondary | ICD-10-CM | POA: Diagnosis not present

## 2018-02-28 DIAGNOSIS — E1165 Type 2 diabetes mellitus with hyperglycemia: Secondary | ICD-10-CM | POA: Diagnosis not present

## 2018-02-28 DIAGNOSIS — N182 Chronic kidney disease, stage 2 (mild): Secondary | ICD-10-CM | POA: Diagnosis not present

## 2018-04-11 MED ORDER — NORTRIPTYLINE HCL 10 MG PO CAPS: 30.0000 mg | ORAL_CAPSULE | Freq: Every day | ORAL | 3 refills | Status: DC

## 2018-05-05 DIAGNOSIS — I10 Essential (primary) hypertension: Secondary | ICD-10-CM | POA: Diagnosis not present

## 2018-05-05 DIAGNOSIS — Z23 Encounter for immunization: Secondary | ICD-10-CM | POA: Diagnosis not present

## 2018-05-23 DIAGNOSIS — E1121 Type 2 diabetes mellitus with diabetic nephropathy: Secondary | ICD-10-CM | POA: Diagnosis not present

## 2018-05-23 DIAGNOSIS — N182 Chronic kidney disease, stage 2 (mild): Secondary | ICD-10-CM | POA: Diagnosis not present

## 2018-05-23 DIAGNOSIS — E0840 Diabetes mellitus due to underlying condition with diabetic neuropathy, unspecified: Secondary | ICD-10-CM | POA: Diagnosis not present

## 2018-05-23 DIAGNOSIS — E039 Hypothyroidism, unspecified: Secondary | ICD-10-CM | POA: Diagnosis not present

## 2018-05-23 DIAGNOSIS — E785 Hyperlipidemia, unspecified: Secondary | ICD-10-CM | POA: Diagnosis not present

## 2018-05-23 DIAGNOSIS — E1129 Type 2 diabetes mellitus with other diabetic kidney complication: Secondary | ICD-10-CM | POA: Diagnosis not present

## 2018-05-23 DIAGNOSIS — E1165 Type 2 diabetes mellitus with hyperglycemia: Secondary | ICD-10-CM | POA: Diagnosis not present

## 2018-05-23 DIAGNOSIS — I10 Essential (primary) hypertension: Secondary | ICD-10-CM | POA: Diagnosis not present

## 2018-05-23 DIAGNOSIS — E1140 Type 2 diabetes mellitus with diabetic neuropathy, unspecified: Secondary | ICD-10-CM | POA: Diagnosis not present

## 2018-06-03 ENCOUNTER — Encounter: Payer: Self-pay | Admitting: Cardiovascular Disease

## 2018-07-04 DIAGNOSIS — N182 Chronic kidney disease, stage 2 (mild): Secondary | ICD-10-CM | POA: Diagnosis not present

## 2018-07-04 DIAGNOSIS — E1121 Type 2 diabetes mellitus with diabetic nephropathy: Secondary | ICD-10-CM | POA: Diagnosis not present

## 2018-07-04 DIAGNOSIS — Z7984 Long term (current) use of oral hypoglycemic drugs: Secondary | ICD-10-CM | POA: Diagnosis not present

## 2018-07-04 DIAGNOSIS — E0840 Diabetes mellitus due to underlying condition with diabetic neuropathy, unspecified: Secondary | ICD-10-CM | POA: Diagnosis not present

## 2018-07-04 DIAGNOSIS — E1140 Type 2 diabetes mellitus with diabetic neuropathy, unspecified: Secondary | ICD-10-CM | POA: Diagnosis not present

## 2018-07-04 DIAGNOSIS — E1165 Type 2 diabetes mellitus with hyperglycemia: Secondary | ICD-10-CM | POA: Diagnosis not present

## 2018-07-04 DIAGNOSIS — E039 Hypothyroidism, unspecified: Secondary | ICD-10-CM | POA: Diagnosis not present

## 2018-07-04 DIAGNOSIS — I10 Essential (primary) hypertension: Secondary | ICD-10-CM | POA: Diagnosis not present

## 2018-07-04 DIAGNOSIS — E1129 Type 2 diabetes mellitus with other diabetic kidney complication: Secondary | ICD-10-CM | POA: Diagnosis not present

## 2018-07-04 DIAGNOSIS — E785 Hyperlipidemia, unspecified: Secondary | ICD-10-CM | POA: Diagnosis not present

## 2018-07-08 ENCOUNTER — Ambulatory Visit (INDEPENDENT_AMBULATORY_CARE_PROVIDER_SITE_OTHER): Payer: PPO | Admitting: Neurology

## 2018-07-08 ENCOUNTER — Encounter: Payer: Self-pay | Admitting: Neurology

## 2018-07-08 VITALS — BP 140/70 | HR 76 | Ht 68.0 in | Wt 211.0 lb

## 2018-07-08 DIAGNOSIS — E1142 Type 2 diabetes mellitus with diabetic polyneuropathy: Secondary | ICD-10-CM | POA: Diagnosis not present

## 2018-07-08 DIAGNOSIS — I951 Orthostatic hypotension: Secondary | ICD-10-CM | POA: Diagnosis not present

## 2018-07-08 NOTE — Patient Instructions (Signed)
Continue your medications as you are taking.  If you would like to reduce nortriptyline because of constipation or dry mouth, please call my office and we can make changes.  Use a cane for uneven surfaces.  Check the soles of your feet daily  Stay well hydrated and make positional changes slowly  Return to clinic in 6 months

## 2018-07-08 NOTE — Progress Notes (Signed)
Follow-up Visit   Date: 07/08/18    JAIKOB BORGWARDT MRN: [id number] DOB: 03-01-1946   [name] is a 72 y.o. right-handed Caucasian male with well-controlled diabetes mellitus, hypertension, hypothyroidism, and hyperlipidemia returning to the clinic for follow-up of neuropathy.    History of present illness: Initial visit 12/2016:  Starting around 2016, he starting having pins and needle sensation over the soles and toes.  Symptoms are constant and worse with walking. He walks unassisted and has not had any falls. He has NCS/EMG of the legs performed by Dr. Domingo Cocking in June 2017 which showed severe sensorimotor polyneuropathy.  He has previously tried gabapentin, but does not recall the dose.  He takes Lyrica 200mg  twice daily and denies any side effects. He was diagnosed with diabetes in 2008 and has been managed with oral medications.  His last HbA1c 6.2.  He has also tried accupuncture.     UPDATE 01/19/2018:  He continues to have pins and needle sensation of the right foot which is constant.  The shooting pain in the feet has significantly subsided, since starting nortriptyline.  Because his pain is exacerbated when his foot hits the floor when walking, he has started to use a stationary bike for exercise.  He takes Lyrica 200mg  twice daily and nortriptyline 20mg  at bedtime.    UPDATE 07/08/2018:  He is here for follow-up visit.  He takes gabapentin 300mg  TID and nortriptyline 30mg  at bedtime which has helped reduce the intensity of his stinging pain. He does not have sharp stinging pain when the bed sheets touch his feet.   He still has tingling discomfort, which is worse when walking when his feet hit the ground.  He has not suffered any falls.   He has constipation and dry mouth which may be due to nortriptyline, he does not want to adjust the dose.  Wife has noticed a few times he was abrupt with applying the brake, but this has not occurred often.  A few times, he has  brief episodes of lightheadedness which mostly occurs when standing up too quickly.  This lasts only a few seconds and resolves.   Medications:  Current Outpatient Medications on File Prior to Visit  Medication Sig Dispense Refill  . gabapentin (NEURONTIN) 300 MG capsule Take 1 capsule (300 mg total) by mouth 3 (three) times daily. 90 capsule 5  . glimepiride (AMARYL) 1 MG tablet     . hydrochlorothiazide (HYDRODIURIL) 25 MG tablet     . levothyroxine (SYNTHROID, LEVOTHROID) 75 MCG tablet     . lisinopril (PRINIVIL,ZESTRIL) 40 MG tablet TAKE 1 TABLET BY MOUTH DAILY    . metFORMIN (GLUCOPHAGE) 1000 MG tablet     . nortriptyline (PAMELOR) 10 MG capsule Take 3 capsules (30 mg total) by mouth at bedtime. 270 capsule 3  . simvastatin (ZOCOR) 80 MG tablet      No current facility-administered medications on file prior to visit.     Allergies: No Known Allergies  Review of Systems:  CONSTITUTIONAL: No fevers, chills, night sweats, or weight loss.  EYES: No visual changes or eye pain ENT: No hearing changes.  No history of nose bleeds.   RESPIRATORY: No cough, wheezing and shortness of breath.   CARDIOVASCULAR: Negative for chest pain, and palpitations.   GI: Negative for abdominal discomfort, blood in stools or black stools.  No recent change in bowel habits.  +constipation GU:  No history of incontinence.   MUSCLOSKELETAL: No history of  joint pain or swelling.  No myalgias.   SKIN: Negative for lesions, rash, and itching.   ENDOCRINE: Negative for cold or heat intolerance, polydipsia or goiter.   PSYCH:  No depression or anxiety symptoms.   NEURO: As Above.   Vital Signs:  BP 140/70   Pulse 76   Ht 5\' 8"  (1.727 m)   Wt 211 lb (95.7 kg)   SpO2 97%   BMI 32.08 kg/m   General Medical Exam:   General:  Well appearing, comfortable  Eyes/ENT: see cranial nerve examination.   Neck: No masses appreciated.  Full range of motion without tenderness.  No carotid bruits. Respiratory:   Clear to auscultation, good air entry bilaterally.   Cardiac:  Regular rate and rhythm, there is a systolic murmur.   Ext:  No edema  Neurological Exam: MENTAL STATUS including orientation to time, place, person, recent and remote memory, attention span and concentration, language, and fund of knowledge is normal.  Speech is not dysarthric.  CRANIAL NERVES:  Pupils equal round and reactive to light.  Normal conjugate, extra-ocular eye movements in all directions of gaze.    Face is symmetric.   MOTOR:  Motor strength is 5/5 in all extremities.  No pronator drift.  Tone is normal.    MSRs:  Reflexes are 2+/4 throughout, except absent Achilles.  SENSORY:  Reduced vibration distal to ankles bilaterally.    COORDINATION/GAIT: Gait narrow based and stable, unassisted.    DATA: NCS/EMG of the legs 01/09/2016 performed elsewhere:  Severe sensory and motor polyneuropathy with chronic denervation consistent with patient's known history of diabetes mellitus.   Labs 12/2016:  vitamin B12 298, copper 95, SPEP with IFE A poorly-defined area of restricted protein mobility is detected and is reactive with IgG and Kappa antisera, folate >24.0   IMPRESSION/PLAN: Painful peripheral neuropathy contributed by diabetes mellitus.  SPEP shows faint IgG kappa monoclonal protein, no M protein.   He continues to have paresthesias without evidence of weakness, sensory ataxia, or imbalance. Painful paresthesias has significantly improved on gabapentin, as compared to Lyrica. He is having some constipation and dry mouth which may be caused by nortriptyline.  I offered to reduced nortriptyline to 20mg  and try increasing bedtime gabapentin to 600mg  at bedtime.  He would like to stay on his current regimen for now.  Continue gabapentin 300mg  three times daily Continue nortriptyline 30mg  at bedtime Recheck SPEP and UPEP with IFE in 6 months Encouraged to use a collapsible cane for uneven ground Driving precautions  discussed  Orthostasis.  Recommend making postional changes slowly and staying well hydrated  Return to clinic in 6 months   Thank you for allowing me to participate in patient's care.  If I can answer any additional questions, I would be pleased to do so.    Sincerely,    Harbert Fitterer K. Posey Pronto, DO

## 2018-07-25 ENCOUNTER — Other Ambulatory Visit: Payer: Self-pay | Admitting: Neurology

## 2018-08-12 DIAGNOSIS — L821 Other seborrheic keratosis: Secondary | ICD-10-CM | POA: Diagnosis not present

## 2018-08-12 DIAGNOSIS — L570 Actinic keratosis: Secondary | ICD-10-CM | POA: Diagnosis not present

## 2018-08-12 DIAGNOSIS — D1801 Hemangioma of skin and subcutaneous tissue: Secondary | ICD-10-CM | POA: Diagnosis not present

## 2018-08-12 DIAGNOSIS — D229 Melanocytic nevi, unspecified: Secondary | ICD-10-CM | POA: Diagnosis not present

## 2018-08-12 DIAGNOSIS — L819 Disorder of pigmentation, unspecified: Secondary | ICD-10-CM | POA: Diagnosis not present

## 2018-08-17 DIAGNOSIS — E039 Hypothyroidism, unspecified: Secondary | ICD-10-CM | POA: Diagnosis not present

## 2018-08-17 DIAGNOSIS — E1140 Type 2 diabetes mellitus with diabetic neuropathy, unspecified: Secondary | ICD-10-CM | POA: Diagnosis not present

## 2018-08-17 DIAGNOSIS — N182 Chronic kidney disease, stage 2 (mild): Secondary | ICD-10-CM | POA: Diagnosis not present

## 2018-08-17 DIAGNOSIS — E1129 Type 2 diabetes mellitus with other diabetic kidney complication: Secondary | ICD-10-CM | POA: Diagnosis not present

## 2018-08-17 DIAGNOSIS — I10 Essential (primary) hypertension: Secondary | ICD-10-CM | POA: Diagnosis not present

## 2018-08-17 DIAGNOSIS — E1165 Type 2 diabetes mellitus with hyperglycemia: Secondary | ICD-10-CM | POA: Diagnosis not present

## 2018-08-17 DIAGNOSIS — E1121 Type 2 diabetes mellitus with diabetic nephropathy: Secondary | ICD-10-CM | POA: Diagnosis not present

## 2018-08-17 DIAGNOSIS — E785 Hyperlipidemia, unspecified: Secondary | ICD-10-CM | POA: Diagnosis not present

## 2018-08-29 DIAGNOSIS — E1140 Type 2 diabetes mellitus with diabetic neuropathy, unspecified: Secondary | ICD-10-CM | POA: Diagnosis not present

## 2018-08-29 DIAGNOSIS — E039 Hypothyroidism, unspecified: Secondary | ICD-10-CM | POA: Diagnosis not present

## 2018-08-29 DIAGNOSIS — E7800 Pure hypercholesterolemia, unspecified: Secondary | ICD-10-CM | POA: Diagnosis not present

## 2018-08-29 DIAGNOSIS — I350 Nonrheumatic aortic (valve) stenosis: Secondary | ICD-10-CM | POA: Diagnosis not present

## 2018-08-29 DIAGNOSIS — I10 Essential (primary) hypertension: Secondary | ICD-10-CM | POA: Diagnosis not present

## 2018-08-29 DIAGNOSIS — E1121 Type 2 diabetes mellitus with diabetic nephropathy: Secondary | ICD-10-CM | POA: Diagnosis not present

## 2018-08-29 DIAGNOSIS — N182 Chronic kidney disease, stage 2 (mild): Secondary | ICD-10-CM | POA: Diagnosis not present

## 2018-09-05 DIAGNOSIS — D124 Benign neoplasm of descending colon: Secondary | ICD-10-CM | POA: Diagnosis not present

## 2018-09-05 DIAGNOSIS — Z1211 Encounter for screening for malignant neoplasm of colon: Secondary | ICD-10-CM | POA: Diagnosis not present

## 2018-09-05 DIAGNOSIS — D123 Benign neoplasm of transverse colon: Secondary | ICD-10-CM | POA: Diagnosis not present

## 2018-09-05 DIAGNOSIS — K648 Other hemorrhoids: Secondary | ICD-10-CM | POA: Diagnosis not present

## 2018-09-05 DIAGNOSIS — K635 Polyp of colon: Secondary | ICD-10-CM | POA: Diagnosis not present

## 2018-09-05 DIAGNOSIS — Z86010 Personal history of colonic polyps: Secondary | ICD-10-CM | POA: Diagnosis not present

## 2018-09-07 DIAGNOSIS — D123 Benign neoplasm of transverse colon: Secondary | ICD-10-CM | POA: Diagnosis not present

## 2018-09-13 NOTE — Progress Notes (Deleted)
Cardiology Office Note   Date:  09/13/2018   ID:  Chad [name], DOB [birth year], MRN [id number]  PCP:  Seward Carol, MD  Cardiologist:   Jenkins Rouge, MD   No chief complaint on file.     History of Present Illness: Chad [name] is a 73 y.o. male who presents for F/U CAD and AS. First seen 08/27/17 History of DM, HTN, hypothyroidism and HLD Has polyneuropathy especially feet not helped with gabapentin. On lyrica as well Been diabetic for 11 years on oral meds last A1c 6.2 Neurologist considering starting TCA for his neuropathy. ECG ok and QT normal   Labs LDL 71 A1c 7.0 Cr 1.1 K 4.4   His activity is limited by neuropathy in feet No chest pain Exertional dyspnea No palpitations or dypspnea Wife indicates history of murmur  TTE:  09/15/18   ***  Normal ETT 09/03/17 Calcium score 795 81 st percentile 08/27/17  ***    Past Medical History:  Diagnosis Date  . CKD (chronic kidney disease)   . Colon polyps   . Diabetes ([place])   . GERD (gastroesophageal reflux disease)   . Hypercholesterolemia   . Hypertension   . Hypothyroidism   . Polyneuropathy   . Proteinuria     No past surgical history on file.   Current Outpatient Medications  Medication Sig Dispense Refill  . gabapentin (NEURONTIN) 300 MG capsule TAKE ONE CAPSULE BY MOUTH THREE TIMES A DAY 90 capsule 1  . glimepiride (AMARYL) 1 MG tablet     . hydrochlorothiazide (HYDRODIURIL) 25 MG tablet     . levothyroxine (SYNTHROID, LEVOTHROID) 75 MCG tablet     . lisinopril (PRINIVIL,ZESTRIL) 40 MG tablet TAKE 1 TABLET BY MOUTH DAILY    . metFORMIN (GLUCOPHAGE) 1000 MG tablet     . nortriptyline (PAMELOR) 10 MG capsule Take 3 capsules (30 mg total) by mouth at bedtime. 270 capsule 3  . simvastatin (ZOCOR) 80 MG tablet      No current facility-administered medications for this visit.     Allergies:   Patient has no known allergies.    Social History:  The patient  reports that he has quit smoking. He has never used  smokeless tobacco. He reports current alcohol use. He reports that he does not use drugs.   Family History:  The patient's family history is not on file.    ROS:  Please see the history of present illness.   Otherwise, review of systems are positive for none.   All other systems are reviewed and negative.    PHYSICAL EXAM: VS:  There were no vitals taken for this visit. , BMI There is no height or weight on file to calculate BMI. Affect appropriate Healthy:  appears stated age 89: normal Neck supple with no adenopathy JVP normal no bruits no thyromegaly Lungs clear with no wheezing and good diaphragmatic motion Heart:  S1/S2 no murmur, no rub, gallop or click PMI normal Abdomen: benighn, BS positve, no tenderness, no AAA no bruit.  No HSM or HJR Distal pulses intact with no bruits No edema Neuro non-focal Skin warm and dry No muscular weakness    EKG:  NSR normal ECG including QT    Recent Labs: No results found for requested labs within last 8760 hours.    Lipid Panel No results found for: CHOL, TRIG, HDL, CHOLHDL, VLDL, LDLCALC, LDLDIRECT    Wt Readings from Last 3 Encounters:  07/08/18 95.7 kg  01/19/18 99.8 kg  08/27/17 100.4 kg      Other studies Reviewed: Additional studies/ records that were reviewed today include: Office notes neurology and primary .    ASSESSMENT AND PLAN:  1.  HTN Well controlled.  Continue current medications and low sodium Dash type diet.   2. DM Discussed low carb diet.  Target hemoglobin A1c is 6.5 or less.  Continue current medications. 3. HLD on statin labs with primary  4. Neuropathy f/u neuro encouraged him to get head CT to r/o NPH or other pathology  5. Thyroid on replacement TSH normal  6. CAD: Calcium score 795 81 st % for age Normal ETT 09/03/17  *** 7. AS:  Murmur with mean/peak gradients 18/31 mmHg TTE 09/03/17    Current medicines are reviewed at length with the patient today.  The patient does not have concerns  regarding medicines.  The following changes have been made:  no change  Labs/ tests ordered today include: *** No orders of the defined types were placed in this encounter.    Disposition:   FU with cardiology in a year     Signed, Jenkins Rouge, MD  09/13/2018 9:55 AM    [address], [place], [place]  [address] Phone: [phone]; Fax: [phone]

## 2018-09-16 ENCOUNTER — Ambulatory Visit: Payer: PPO | Admitting: Cardiovascular Disease

## 2018-09-16 ENCOUNTER — Other Ambulatory Visit (HOSPITAL_COMMUNITY): Payer: PPO

## 2018-10-31 ENCOUNTER — Telehealth: Payer: Self-pay | Admitting: Cardiovascular Disease

## 2018-10-31 ENCOUNTER — Other Ambulatory Visit: Payer: Self-pay | Admitting: Neurology

## 2018-10-31 NOTE — Telephone Encounter (Signed)
Patient called left voicemail  and discussed cancelling elective echocardiogram due to concerns for Covid19 and need for social distancing . Patient stable with no urgent symptoms.  TTE scheduled for 4/16  can be rescheduled for 3-6 months

## 2018-11-03 MED ORDER — GABAPENTIN 300 MG PO CAPS: ORAL_CAPSULE | ORAL | 3 refills | Status: DC

## 2018-11-10 ENCOUNTER — Other Ambulatory Visit (HOSPITAL_COMMUNITY): Payer: PPO

## 2018-11-10 ENCOUNTER — Ambulatory Visit: Payer: PPO | Admitting: Cardiovascular Disease

## 2018-11-18 DIAGNOSIS — E1165 Type 2 diabetes mellitus with hyperglycemia: Secondary | ICD-10-CM | POA: Diagnosis not present

## 2018-11-18 DIAGNOSIS — N182 Chronic kidney disease, stage 2 (mild): Secondary | ICD-10-CM | POA: Diagnosis not present

## 2018-11-18 DIAGNOSIS — E039 Hypothyroidism, unspecified: Secondary | ICD-10-CM | POA: Diagnosis not present

## 2018-11-18 DIAGNOSIS — I10 Essential (primary) hypertension: Secondary | ICD-10-CM | POA: Diagnosis not present

## 2018-11-18 DIAGNOSIS — E7800 Pure hypercholesterolemia, unspecified: Secondary | ICD-10-CM | POA: Diagnosis not present

## 2019-01-05 ENCOUNTER — Telehealth: Payer: Self-pay | Admitting: Neurology

## 2019-01-05 NOTE — Telephone Encounter (Signed)
Pt returned your call, pls call him again. °

## 2019-01-05 NOTE — Telephone Encounter (Signed)
Updated chart for appt 01/06/19

## 2019-01-06 ENCOUNTER — Other Ambulatory Visit: Payer: Self-pay

## 2019-01-06 ENCOUNTER — Telehealth (INDEPENDENT_AMBULATORY_CARE_PROVIDER_SITE_OTHER): Payer: PPO | Admitting: Neurology

## 2019-01-06 ENCOUNTER — Encounter: Payer: Self-pay | Admitting: Neurology

## 2019-01-06 VITALS — Ht 68.0 in | Wt 215.0 lb

## 2019-01-06 DIAGNOSIS — E1142 Type 2 diabetes mellitus with diabetic polyneuropathy: Secondary | ICD-10-CM

## 2019-01-06 NOTE — Progress Notes (Signed)
   Virtual Visit via Video Note The purpose of this virtual visit is to provide medical care while limiting exposure to the novel coronavirus.    Consent was obtained for video visit:  Yes.   Answered questions that patient had about telehealth interaction:  Yes.   I discussed the limitations, risks, security and privacy concerns of performing an evaluation and management service by telemedicine. I also discussed with the patient that there may be a patient responsible charge related to this service. The patient expressed understanding and agreed to proceed.  Pt location: Home Physician Location: office Name of referring provider:  Seward Carol, MD I connected with Chad [name] at patients initiation/request on 01/06/2019 at  3:30 PM EDT by video enabled telemedicine application and verified that I am speaking with the correct person using two identifiers. Pt MRN:  [id number] Pt DOB:  12/28/1945 Video Participants:  Chad [name]   History of Present Illness: This is a 73 y.o. male returning for follow-up of diabetic neuropathy.  At his last visit, I had recommended to increase his nortriptyline to 30 mg at bedtime, however, he developed constipation at this dose and reduced it back to 20 mg at bedtime.  His neuropathy was better controlled at the higher dose.  He also continues to take gabapentin 300 mg 3 times daily.  For his constipation, he started MiraLAX daily, which has improved his constipation.  He would like to consider going back up on nortriptyline to 30 mg at bedtime now that his bowel regimen is more regular.  He denies any worsening paresthesias in the feet.  He has very mild imbalance but has not had any falls and walks unassisted.  Observations/Objective:   Vitals:   01/06/19 0904  Weight: 215 lb (97.5 kg)  Height: 5\' 8"  (1.727 m)   Patient is awake, alert, and appears comfortable.  Oriented x 4.   Extraocular muscles are intact. No ptosis.  Face is symmetric.  Speech is not  dysarthric. Tongue is midline. Antigravity in all extremities.  No pronator drift. Gait appears normal.  He is able to stand up from low chair with arms crossed.    Assessment and Plan:  1. Painful peripheral neuropathy contributed by diabetes.  Stable  -Previously tried:  Lyrica  -Continue gabapentin 300 mg 3 times daily  -Increase nortriptyline 30 mg at bedtime.  If constipation returns, reduced to 25 mg  -Check EKG at next visit  -Fall precautions discussed, use cane as needed for uneven ground  2. Faint IgG kappa monoclonal protein, no M protein.  -Check SPEP and UPEP with IFE at next visit   Follow Up Instructions:   I discussed the assessment and treatment plan with the patient. The patient was provided an opportunity to ask questions and all were answered. The patient agreed with the plan and demonstrated an understanding of the instructions.   The patient was advised to call back or seek an in-person evaluation if the symptoms worsen or if the condition fails to improve as anticipated.  Follow-up in 6 months  Total time spent:  25 minutes     Alda Berthold, DO

## 2019-01-09 ENCOUNTER — Ambulatory Visit: Payer: PPO | Admitting: Neurology

## 2019-02-15 DIAGNOSIS — E7800 Pure hypercholesterolemia, unspecified: Secondary | ICD-10-CM | POA: Diagnosis not present

## 2019-02-15 DIAGNOSIS — E785 Hyperlipidemia, unspecified: Secondary | ICD-10-CM | POA: Diagnosis not present

## 2019-02-15 DIAGNOSIS — I10 Essential (primary) hypertension: Secondary | ICD-10-CM | POA: Diagnosis not present

## 2019-02-15 DIAGNOSIS — E1121 Type 2 diabetes mellitus with diabetic nephropathy: Secondary | ICD-10-CM | POA: Diagnosis not present

## 2019-02-15 DIAGNOSIS — E1165 Type 2 diabetes mellitus with hyperglycemia: Secondary | ICD-10-CM | POA: Diagnosis not present

## 2019-02-15 DIAGNOSIS — N182 Chronic kidney disease, stage 2 (mild): Secondary | ICD-10-CM | POA: Diagnosis not present

## 2019-02-15 DIAGNOSIS — E039 Hypothyroidism, unspecified: Secondary | ICD-10-CM | POA: Diagnosis not present

## 2019-02-15 DIAGNOSIS — E1129 Type 2 diabetes mellitus with other diabetic kidney complication: Secondary | ICD-10-CM | POA: Diagnosis not present

## 2019-02-15 DIAGNOSIS — E0840 Diabetes mellitus due to underlying condition with diabetic neuropathy, unspecified: Secondary | ICD-10-CM | POA: Diagnosis not present

## 2019-02-15 DIAGNOSIS — E1140 Type 2 diabetes mellitus with diabetic neuropathy, unspecified: Secondary | ICD-10-CM | POA: Diagnosis not present

## 2019-03-15 NOTE — Progress Notes (Signed)
Cardiology Office Note   Date:  03/28/2019   ID:  JOAL EAKLE, DOB [birth year], MRN [id number]  PCP:  Seward Carol, MD  Cardiologist:   Jenkins Rouge, MD   No chief complaint on file.     History of Present Illness: THESEUS [name] is a 73 y.o. male first seen April 2019 regarding CAD risk.  Referred by Dr Delfina Redwood Reviewed his office note from 08/04/17 History of DM, HTN, hypothyroidism and HLD Has polyneuropathy especially feet not helped with gabapentin. On lyrica as well Been diabetic for 11 years on oral meds last A1c 6.2 Neurologist wanted to start TCA for his neuropathy. QT ok   Labs LDL 71 A1c 7.0 Cr 1.1 K 4.4   His activity is limited by neuropathy in feet No chest pain Exertional dyspnea No palpitations or dypspnea Wife indicates history of murmur patient not aware. Has not had echo or stress testing in over a decade  ECG 08/27/17 normal sinus QT 398  Echo 09/03/17  EF 60-65% mild AS mean gradient 18 mmHg peak 31 mmHg Normal ETT 09/03/17 maximum HR was only 110 73% PMHR Calcium score 795 81 st percentile dense in LAD  Started on Pamelor for depression   [facility] with [name] one of my patients Met at [facility]    Past Medical History:  Diagnosis Date  . CKD (chronic kidney disease)   . Colon polyps   . Diabetes ([place])   . GERD (gastroesophageal reflux disease)   . Hypercholesterolemia   . Hypertension   . Hypothyroidism   . Polyneuropathy   . Proteinuria     History reviewed. No pertinent surgical history.   Current Outpatient Medications  Medication Sig Dispense Refill  . gabapentin (NEURONTIN) 300 MG capsule Take 1 tab in the morning, 1 tab in the afternoon, and 2 at bedtime. 360 capsule 3  . glimepiride (AMARYL) 1 MG tablet Take 1 mg by mouth daily with breakfast.     . hydrochlorothiazide (HYDRODIURIL) 25 MG tablet Take 25 mg by mouth daily.     Marland Kitchen levothyroxine (SYNTHROID, LEVOTHROID) 75 MCG tablet Take 75 mcg by mouth daily before breakfast.     . lisinopril  (PRINIVIL,ZESTRIL) 40 MG tablet TAKE 1 TABLET BY MOUTH DAILY    . metFORMIN (GLUCOPHAGE) 1000 MG tablet Take 1,000 mg by mouth 2 (two) times daily with a meal.     . nortriptyline (PAMELOR) 10 MG capsule Take 3 capsules (30 mg total) by mouth at bedtime. 270 capsule 3  . simvastatin (ZOCOR) 80 MG tablet Take 80 mg by mouth daily at 6 PM.     . metoprolol succinate (TOPROL-XL) 25 MG 24 hr tablet Take 1 tablet (25 mg total) by mouth daily. 90 tablet 3   No current facility-administered medications for this visit.     Allergies:   Patient has no known allergies.    Social History:  The patient  reports that he has quit smoking. He has never used smokeless tobacco. He reports current alcohol use. He reports that he does not use drugs.   Family History:  The patient's family history is not on file.    ROS:  Please see the history of present illness.   Otherwise, review of systems are positive for none.   All other systems are reviewed and negative.    PHYSICAL EXAM: VS:  BP (!) 162/82   Pulse 77   Ht '5\' 8"'  (1.727 m)   Wt 205 lb 6.4 oz (  93.2 kg)   SpO2 98%   BMI 31.23 kg/m  , BMI Body mass index is 31.23 kg/m. Affect appropriate Healthy:  appears stated age 89: normal Neck supple with no adenopathy JVP normal no bruits no thyromegaly Lungs clear with no wheezing and good diaphragmatic motion Heart:  S1/S2 AS  murmur, no rub, gallop or click PMI normal Abdomen: benighn, BS positve, no tenderness, no AAA no bruit.  No HSM or HJR Distal pulses intact with no bruits No edema Neuro non-focal Skin warm and dry No muscular weakness   EKG:  03/28/19 SR rate 77 QT 378    Recent Labs: No results found for requested labs within last 8760 hours.    Lipid Panel No results found for: CHOL, TRIG, HDL, CHOLHDL, VLDL, LDLCALC, LDLDIRECT    Wt Readings from Last 3 Encounters:  03/28/19 205 lb 6.4 oz (93.2 kg)  01/06/19 215 lb (97.5 kg)  07/08/18 211 lb (95.7 kg)      Other  studies Reviewed: Additional studies/ records that were reviewed today include: Office notes neurology and primary .    ASSESSMENT AND PLAN:  1.  HTN elevated add Toprol 25 mg daily f/u 3 months  2. DM Discussed low carb diet.  Target hemoglobin A1c is 6.5 or less.  Continue current medications. 3. HLD on statin labs with primary  4. Neuropathy f/u neuro encouraged him to get head CT to r/o NPH or other pathology QT normal on Pamelor with improvement  5. Thyroid on replacement TSH normal  6. CAD:  Risk given age and long standing DM with secondary end organ damge. ECG is normal including QT so ok to use TCA for Rx neuropathy. Normal ETT high calcium socre 795 especially I LAD observe  7. Murmur: Mild AS will order f/u echo 3 months with next visit     Current medicines are reviewed at length with the patient today.  The patient does not have concerns regarding medicines.  The following changes have been made:  no change  Labs/ tests ordered today include:   Echo for AS    Disposition:   FU with cardiology in 3 months     Signed, Jenkins Rouge, MD  03/28/2019 3:56 PM    [address], [place], [place]  [address] Phone: [phone]; Fax: [phone]

## 2019-03-24 DIAGNOSIS — N183 Chronic kidney disease, stage 3 (moderate): Secondary | ICD-10-CM | POA: Diagnosis not present

## 2019-03-24 DIAGNOSIS — I350 Nonrheumatic aortic (valve) stenosis: Secondary | ICD-10-CM | POA: Diagnosis not present

## 2019-03-24 DIAGNOSIS — Z125 Encounter for screening for malignant neoplasm of prostate: Secondary | ICD-10-CM | POA: Diagnosis not present

## 2019-03-24 DIAGNOSIS — E1121 Type 2 diabetes mellitus with diabetic nephropathy: Secondary | ICD-10-CM | POA: Diagnosis not present

## 2019-03-24 DIAGNOSIS — E0840 Diabetes mellitus due to underlying condition with diabetic neuropathy, unspecified: Secondary | ICD-10-CM | POA: Diagnosis not present

## 2019-03-24 DIAGNOSIS — Z1389 Encounter for screening for other disorder: Secondary | ICD-10-CM | POA: Diagnosis not present

## 2019-03-24 DIAGNOSIS — E1129 Type 2 diabetes mellitus with other diabetic kidney complication: Secondary | ICD-10-CM | POA: Diagnosis not present

## 2019-03-24 DIAGNOSIS — I10 Essential (primary) hypertension: Secondary | ICD-10-CM | POA: Diagnosis not present

## 2019-03-24 DIAGNOSIS — E7800 Pure hypercholesterolemia, unspecified: Secondary | ICD-10-CM | POA: Diagnosis not present

## 2019-03-24 DIAGNOSIS — E1140 Type 2 diabetes mellitus with diabetic neuropathy, unspecified: Secondary | ICD-10-CM | POA: Diagnosis not present

## 2019-03-24 DIAGNOSIS — E785 Hyperlipidemia, unspecified: Secondary | ICD-10-CM | POA: Diagnosis not present

## 2019-03-24 DIAGNOSIS — E871 Hypo-osmolality and hyponatremia: Secondary | ICD-10-CM | POA: Diagnosis not present

## 2019-03-24 DIAGNOSIS — E1165 Type 2 diabetes mellitus with hyperglycemia: Secondary | ICD-10-CM | POA: Diagnosis not present

## 2019-03-24 DIAGNOSIS — Z0000 Encounter for general adult medical examination without abnormal findings: Secondary | ICD-10-CM | POA: Diagnosis not present

## 2019-03-24 DIAGNOSIS — N182 Chronic kidney disease, stage 2 (mild): Secondary | ICD-10-CM | POA: Diagnosis not present

## 2019-03-24 DIAGNOSIS — E039 Hypothyroidism, unspecified: Secondary | ICD-10-CM | POA: Diagnosis not present

## 2019-03-27 DIAGNOSIS — D649 Anemia, unspecified: Secondary | ICD-10-CM | POA: Diagnosis not present

## 2019-03-28 ENCOUNTER — Ambulatory Visit: Payer: PPO | Admitting: Cardiovascular Disease

## 2019-03-28 ENCOUNTER — Encounter: Payer: Self-pay | Admitting: Cardiovascular Disease

## 2019-03-28 ENCOUNTER — Other Ambulatory Visit: Payer: Self-pay

## 2019-03-28 VITALS — BP 162/82 | HR 77 | Ht 68.0 in | Wt 205.4 lb

## 2019-03-28 DIAGNOSIS — I350 Nonrheumatic aortic (valve) stenosis: Secondary | ICD-10-CM | POA: Diagnosis not present

## 2019-03-28 MED ORDER — METOPROLOL SUCCINATE ER 25 MG PO TB24: 25.0000 mg | ORAL_TABLET | Freq: Every day | ORAL | 3 refills | Status: DC

## 2019-03-28 NOTE — Patient Instructions (Addendum)
Your physician has recommended you make the following change in your medication: METOPROLOL 25 MG EVERY AM LISINOPRIL MID DAY HYDROCHLOROTHIAZIDE  PM Your physician has requested that you have an echocardiogram. Echocardiography is a painless test that uses sound waves to create images of your heart. It provides your doctor with information about the size and shape of your heart and how well your heart's chambers and valves are working. This procedure takes approximately one hour. There are no restrictions for this procedure.   3  MONTHS ECHO AND SEE DR Cade   Your physician wants you to follow-up in: [place] [facility] [place] will receive a reminder letter in the mail two months in advance. If you don't receive a letter, please call our office to schedule the follow-up appointment.

## 2019-04-05 DIAGNOSIS — L570 Actinic keratosis: Secondary | ICD-10-CM | POA: Diagnosis not present

## 2019-04-05 DIAGNOSIS — D485 Neoplasm of uncertain behavior of skin: Secondary | ICD-10-CM | POA: Diagnosis not present

## 2019-04-05 DIAGNOSIS — D044 Carcinoma in situ of skin of scalp and neck: Secondary | ICD-10-CM | POA: Diagnosis not present

## 2019-04-05 DIAGNOSIS — L814 Other melanin hyperpigmentation: Secondary | ICD-10-CM | POA: Diagnosis not present

## 2019-04-05 DIAGNOSIS — D229 Melanocytic nevi, unspecified: Secondary | ICD-10-CM | POA: Diagnosis not present

## 2019-04-05 DIAGNOSIS — L821 Other seborrheic keratosis: Secondary | ICD-10-CM | POA: Diagnosis not present

## 2019-04-05 DIAGNOSIS — D1801 Hemangioma of skin and subcutaneous tissue: Secondary | ICD-10-CM | POA: Diagnosis not present

## 2019-04-06 DIAGNOSIS — C4442 Squamous cell carcinoma of skin of scalp and neck: Secondary | ICD-10-CM | POA: Diagnosis not present

## 2019-04-07 DIAGNOSIS — E871 Hypo-osmolality and hyponatremia: Secondary | ICD-10-CM | POA: Diagnosis not present

## 2019-04-19 DIAGNOSIS — D225 Melanocytic nevi of trunk: Secondary | ICD-10-CM | POA: Diagnosis not present

## 2019-04-19 DIAGNOSIS — D485 Neoplasm of uncertain behavior of skin: Secondary | ICD-10-CM | POA: Diagnosis not present

## 2019-04-23 ENCOUNTER — Other Ambulatory Visit: Payer: Self-pay | Admitting: Neurology

## 2019-04-26 DIAGNOSIS — E1165 Type 2 diabetes mellitus with hyperglycemia: Secondary | ICD-10-CM | POA: Diagnosis not present

## 2019-04-26 DIAGNOSIS — E0840 Diabetes mellitus due to underlying condition with diabetic neuropathy, unspecified: Secondary | ICD-10-CM | POA: Diagnosis not present

## 2019-04-26 DIAGNOSIS — E1129 Type 2 diabetes mellitus with other diabetic kidney complication: Secondary | ICD-10-CM | POA: Diagnosis not present

## 2019-04-26 DIAGNOSIS — I10 Essential (primary) hypertension: Secondary | ICD-10-CM | POA: Diagnosis not present

## 2019-04-26 DIAGNOSIS — N182 Chronic kidney disease, stage 2 (mild): Secondary | ICD-10-CM | POA: Diagnosis not present

## 2019-04-26 DIAGNOSIS — E785 Hyperlipidemia, unspecified: Secondary | ICD-10-CM | POA: Diagnosis not present

## 2019-04-26 DIAGNOSIS — E7800 Pure hypercholesterolemia, unspecified: Secondary | ICD-10-CM | POA: Diagnosis not present

## 2019-04-26 DIAGNOSIS — E039 Hypothyroidism, unspecified: Secondary | ICD-10-CM | POA: Diagnosis not present

## 2019-04-26 DIAGNOSIS — N183 Chronic kidney disease, stage 3 (moderate): Secondary | ICD-10-CM | POA: Diagnosis not present

## 2019-04-26 DIAGNOSIS — E1121 Type 2 diabetes mellitus with diabetic nephropathy: Secondary | ICD-10-CM | POA: Diagnosis not present

## 2019-04-26 DIAGNOSIS — E1140 Type 2 diabetes mellitus with diabetic neuropathy, unspecified: Secondary | ICD-10-CM | POA: Diagnosis not present

## 2019-05-20 DIAGNOSIS — Z23 Encounter for immunization: Secondary | ICD-10-CM | POA: Diagnosis not present

## 2019-05-30 IMAGING — CT CT HEART SCORING
2 series · 16 of 20 positions shown, 18 images · non-contrast
Comparison: None.

CLINICAL DATA: Risk stratification

EXAM:
Coronary Calcium Score
TECHNIQUE: The patient was scanned on a Siemens Somatom 64 slice scanner. Axial
non-contrast 3 mm slices were carried out through the heart. The
data set was analyzed on a dedicated work station and scored using
the Agatson method.
CLINICAL DATA: Hyperlipidemia

[Series 2: casc 3.0 i36f 2 bestdiast 69 % · axial · 0.39mm/px · z∈[-218,-134]mm · 8 of 36 slices shown, 10 images]
[im 4/36  vessel]
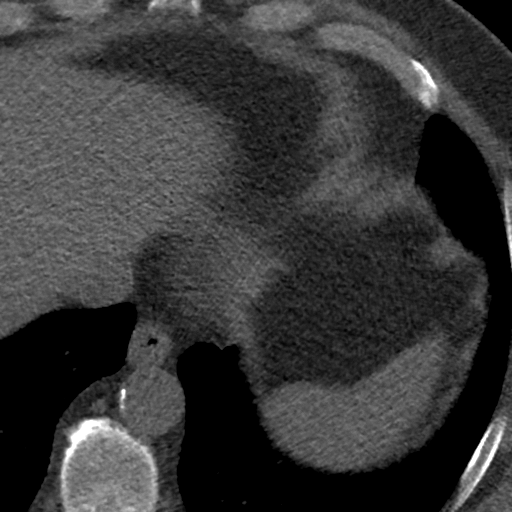
[im 4/36  lung]
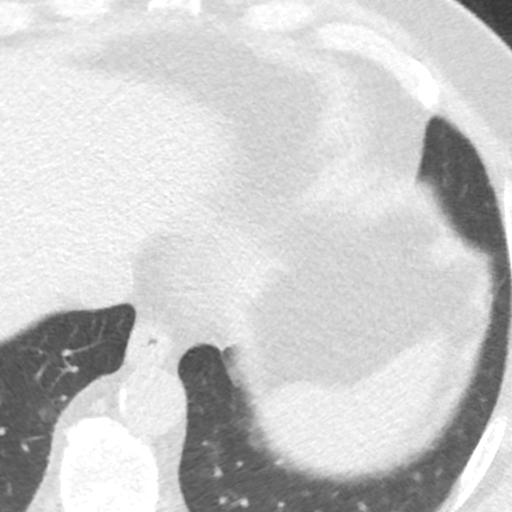
[im 8/36  vessel]
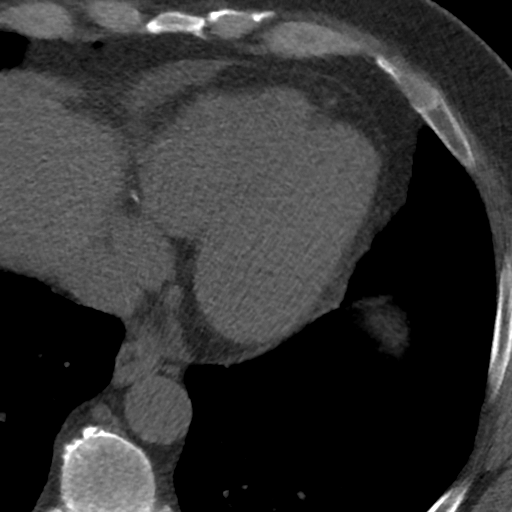
[im 12/36  vessel]
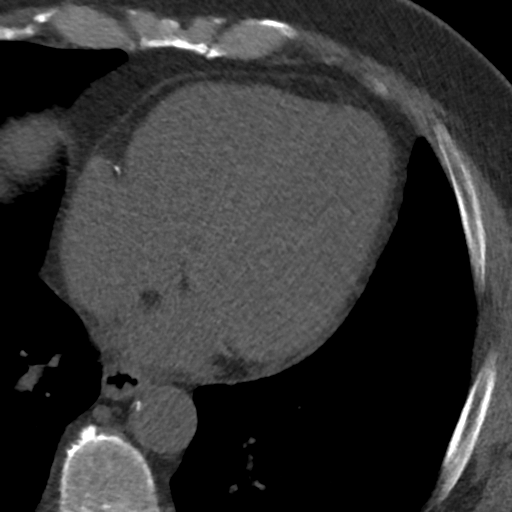
[im 16/36  vessel]
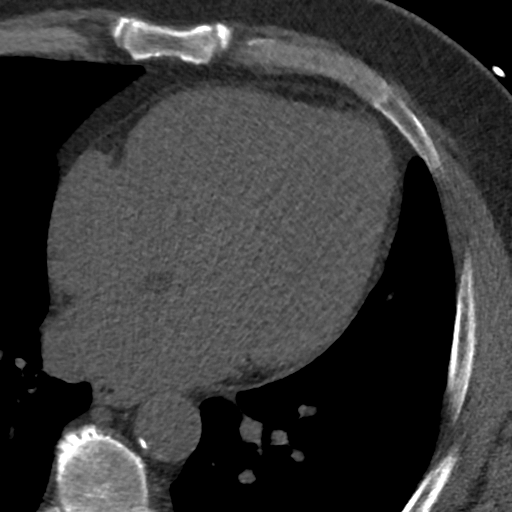
[im 20/36  vessel]
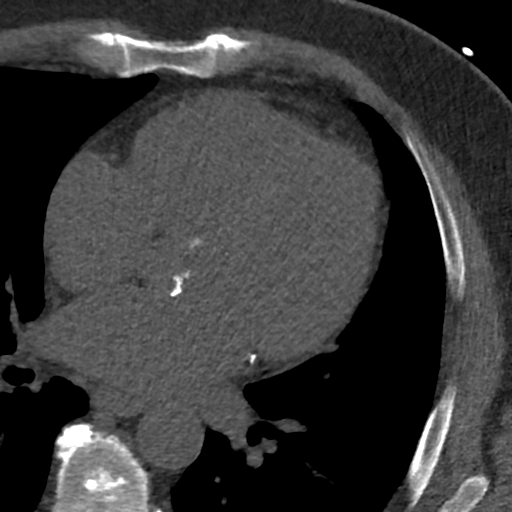
[im 20/36  lung]
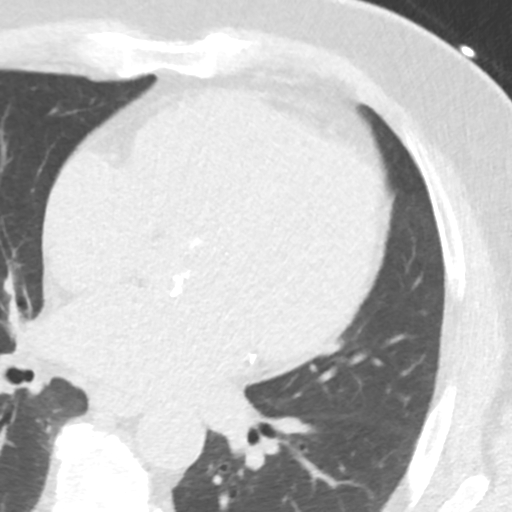
[im 24/36  vessel]
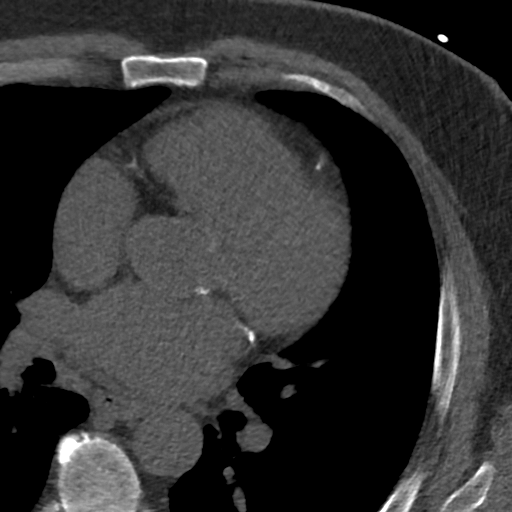
[im 28/36  vessel]
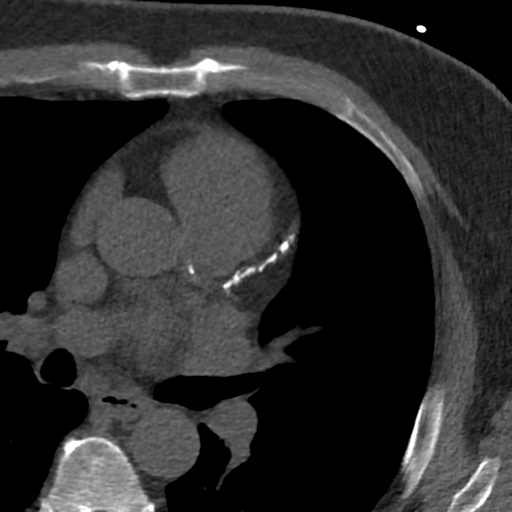
[im 32/36  vessel]
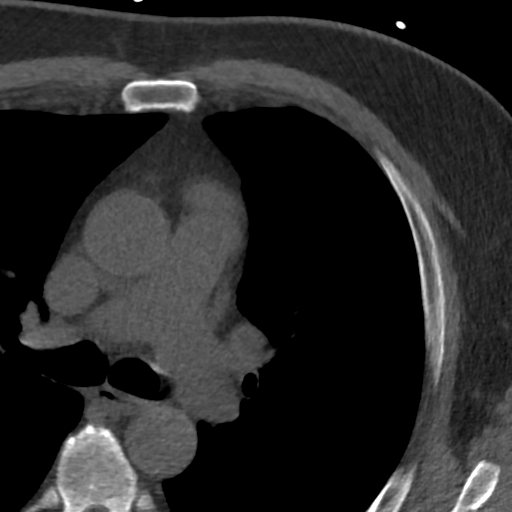

[Series 4: lung st 68 % · axial · 0.70mm/px · z∈[-218,-134]mm · 8 of 36 slices shown]
[im 4/36  lung]
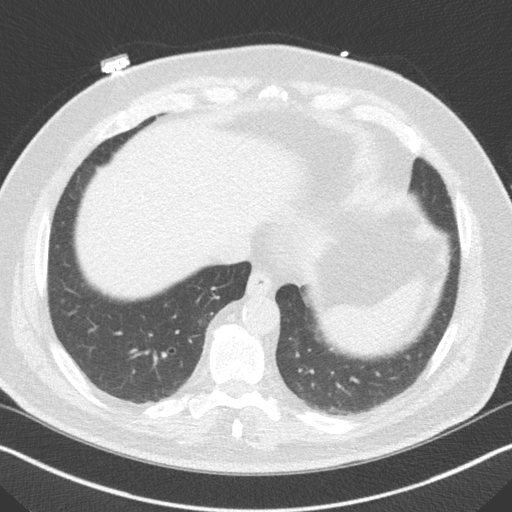
[im 8/36  lung]
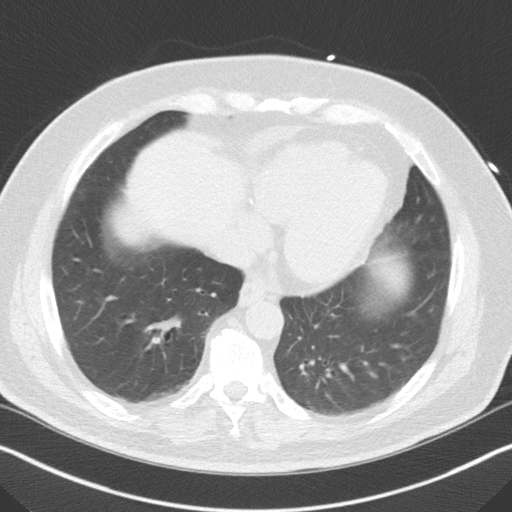
[im 12/36  lung]
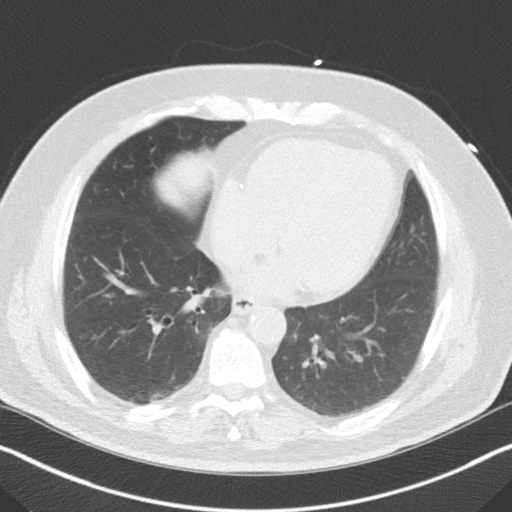
[im 16/36  lung]
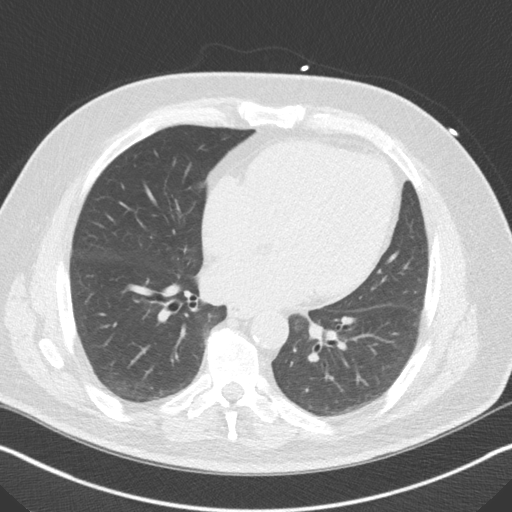
[im 20/36  lung]
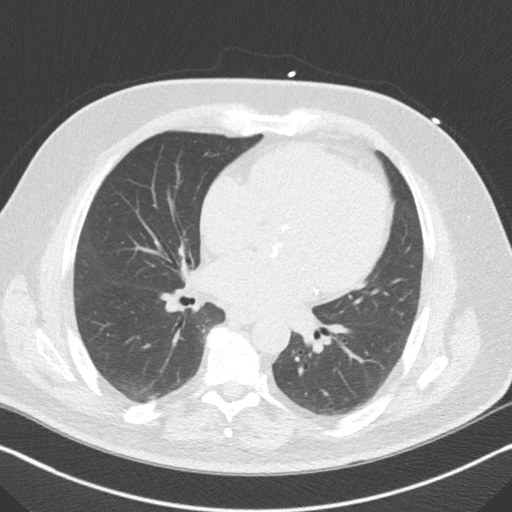
[im 24/36  lung]
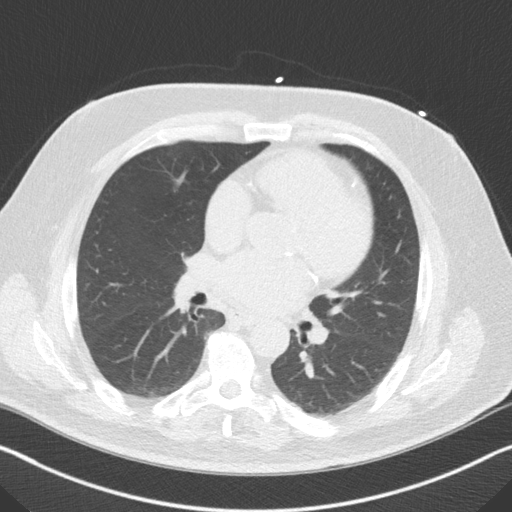
[im 28/36  lung]
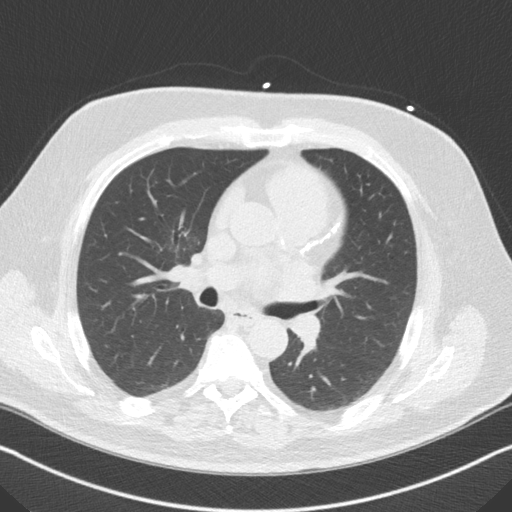
[im 32/36  lung]
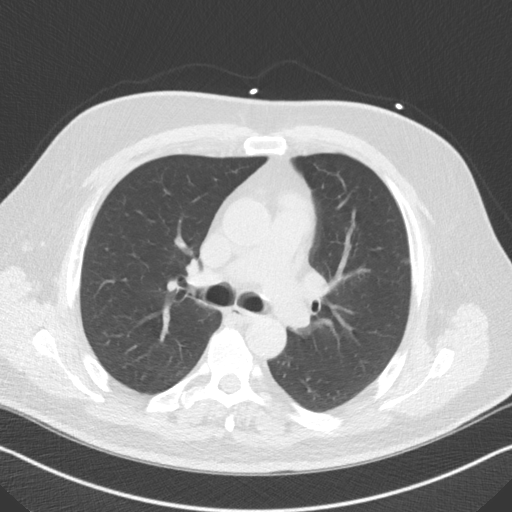

[16 of 20 positions shown; findings below may reference images not displayed]

FINDINGS: Non-cardiac: See separate report from [REDACTED].

Ascending Aorta: Aortic atherosclerosis diameter 3.4 cm

Pericardium: Normal

Coronary arteries: 3 vessel coronary calcium especially dense in LAD
distribution
IMPRESSION: Coronary calcium score of 795 . This was 81 st percentile for age
and sex matched control.

Shanaya Clegg

EXAM:
OVER-READ INTERPRETATION  CT CHEST

The following report is an over-read performed by radiologist Dr.
Borqna Tunev [REDACTED] on 08/27/2017. This
over-read does not include interpretation of cardiac or coronary
anatomy or pathology. The coronary calcium score interpretation by
the cardiologist is attached.
FINDINGS: Limited view of the lung parenchyma demonstrates no suspicious
nodularity. Airways are normal.

Limited view of the mediastinum demonstrates no adenopathy.
Esophagus normal.

Limited view of the upper abdomen unremarkable.

Limited view of the skeleton and chest wall is unremarkable.
IMPRESSION: No significant extracardiac findings.

## 2019-06-01 DIAGNOSIS — L821 Other seborrheic keratosis: Secondary | ICD-10-CM | POA: Diagnosis not present

## 2019-06-01 DIAGNOSIS — Z85828 Personal history of other malignant neoplasm of skin: Secondary | ICD-10-CM | POA: Diagnosis not present

## 2019-06-01 DIAGNOSIS — L905 Scar conditions and fibrosis of skin: Secondary | ICD-10-CM | POA: Diagnosis not present

## 2019-06-01 DIAGNOSIS — L570 Actinic keratosis: Secondary | ICD-10-CM | POA: Diagnosis not present

## 2019-06-28 NOTE — Progress Notes (Signed)
Cardiology Office Note   Date:  06/30/2019   ID:  JAQUISE FAUX, DOB 1945-12-24, MRN [id number]  PCP:  Seward Carol, MD  Cardiologist:   Jenkins Rouge, MD   No chief complaint on file.     History of Present Illness: Chad [name] is a 73 y.o. male first seen April 2019 regarding CAD risk.  Referred by Dr Delfina Redwood Reviewed his office note from 08/04/17 History of DM, HTN, hypothyroidism and HLD Has polyneuropathy especially feet not helped with gabapentin. On lyrica as well Been diabetic for 11 years on oral meds last A1c 6.2 Neurologist wanted to start TCA for his neuropathy. QT ok   Labs LDL 71 A1c 7.0 Cr 1.1 K 4.4   His activity is limited by neuropathy in feet No chest pain Exertional dyspnea No palpitations or dypspnea Wife indicates history of murmur patient not aware.    ECG 08/27/17 normal sinus QT 398  Echo 09/03/17  EF 60-65% mild AS mean gradient 18 mmHg peak 31 mmHg Normal ETT 09/03/17 maximum HR was only 110 73% PMHR Calcium score 795 81 st percentile dense in LAD he is on statin   Started on Pamelor for depression   [facility]riends with [name] one of my patients Met at [facility]   F/U echo done 06/30/19 with mean gradient similar 19 mmHg and peak 35 mmHg   No cardiac complaints Since COVID not exercising and eating poorly    Past Medical History:  Diagnosis Date  . CKD (chronic kidney disease)   . Colon polyps   . Diabetes ([place])   . GERD (gastroesophageal reflux disease)   . Hypercholesterolemia   . Hypertension   . Hypothyroidism   . Polyneuropathy   . Proteinuria     No past surgical history on file.   Current Outpatient Medications  Medication Sig Dispense Refill  . gabapentin (NEURONTIN) 300 MG capsule Take 1 tab in the morning, 1 tab in the afternoon, and 2 at bedtime. 360 capsule 3  . glimepiride (AMARYL) 1 MG tablet Take 1 mg by mouth daily with breakfast.     . hydrochlorothiazide (HYDRODIURIL) 25 MG tablet Take 25 mg by mouth daily.     Marland Kitchen  levothyroxine (SYNTHROID, LEVOTHROID) 75 MCG tablet Take 75 mcg by mouth daily before breakfast.     . lisinopril (PRINIVIL,ZESTRIL) 40 MG tablet TAKE 1 TABLET BY MOUTH DAILY    . metFORMIN (GLUCOPHAGE) 1000 MG tablet Take 1,000 mg by mouth 2 (two) times daily with a meal.     . metoprolol succinate (TOPROL-XL) 25 MG 24 hr tablet Take 1 tablet (25 mg total) by mouth daily. 90 tablet 3  . nortriptyline (PAMELOR) 10 MG capsule TAKE 3 CAPSULE(S) BY MOUTH EVERY NIGHT AT BEDTIME 270 capsule 1  . simvastatin (ZOCOR) 80 MG tablet Take 80 mg by mouth daily at 6 PM.      No current facility-administered medications for this visit.    Facility-Administered Medications Ordered in Other Visits  Medication Dose Route Frequency Provider Last Rate Last Dose  . perflutren lipid microspheres (DEFINITY) IV suspension  1-10 mL Intravenous PRN Josue Hector, MD   2 mL at 06/30/19 1501    Allergies:   Patient has no known allergies.    Social History:  The patient  reports that he has quit smoking. He has never used smokeless tobacco. He reports current alcohol use. He reports that he does not use drugs.   Family History:  The patient's  family history is not on file.    ROS:  Please see the history of present illness.   Otherwise, review of systems are positive for none.   All other systems are reviewed and negative.    PHYSICAL EXAM: VS:  BP (!) 146/78 (BP Location: Right Arm, Patient Position: Sitting, Cuff Size: Normal)   Ht '5\' 8"'  (1.727 m)   Wt 206 lb 12.8 oz (93.8 kg)   BMI 31.44 kg/m  , BMI Body mass index is 31.44 kg/m. Affect appropriate Healthy:  appears stated age 73: normal Neck supple with no adenopathy JVP normal no bruits no thyromegaly Lungs clear with no wheezing and good diaphragmatic motion Heart:  S1/S2 AS  murmur, no rub, gallop or click PMI normal Abdomen: benighn, BS positve, no tenderness, no AAA no bruit.  No HSM or HJR Distal pulses intact with no bruits No edema  Neuro non-focal Skin warm and dry No muscular weakness   EKG:  03/28/19 SR rate 77 QT 378    Recent Labs: No results found for requested labs within last 8760 hours.    Lipid Panel No results found for: CHOL, TRIG, HDL, CHOLHDL, VLDL, LDLCALC, LDLDIRECT    Wt Readings from Last 3 Encounters:  06/30/19 206 lb 12.8 oz (93.8 kg)  03/28/19 205 lb 6.4 oz (93.2 kg)  01/06/19 215 lb (97.5 kg)      Other studies Reviewed: Additional studies/ records that were reviewed today include: Office notes neurology and primary .    ASSESSMENT AND PLAN:  1.  HTN Toprol added September 2020 improved needs to get back to pre covid exercise routine  2. DM Discussed low carb diet.  Target hemoglobin A1c is 6.5 or less.  Continue current medications. 3. HLD on statin labs with primary  4. Neuropathy f/u neuro encouraged him to get head CT to r/o NPH or other pathology QT normal on Pamelor with improvement  5. Thyroid on replacement TSH normal  6. CAD:  Risk given age and long standing DM with secondary end organ damge. ECG is normal including QT so ok to use TCA for Rx neuropathy. Normal ETT high calcium socre 795 especially I LAD observe  7. Murmur: Mild AS Echo 03/28/19 reviewed similar gradients on echo today 06/30/19 although I would grade this as mild to moderate f/u echo December 2021     Current medicines are reviewed at length with the patient today.  The patient does not have concerns regarding medicines.  The following changes have been made:  no change  Labs/ tests ordered today include:   Echo in a year for AS    Disposition:   FU with cardiology in a year     Signed, Jenkins Rouge, MD  06/30/2019 3:24 PM    [address], [place], [place]  [address] Phone: [phone]; Fax: [phone]

## 2019-06-30 ENCOUNTER — Ambulatory Visit (HOSPITAL_COMMUNITY): Payer: PPO | Attending: Cardiology

## 2019-06-30 ENCOUNTER — Ambulatory Visit (INDEPENDENT_AMBULATORY_CARE_PROVIDER_SITE_OTHER): Payer: PPO | Admitting: Cardiovascular Disease

## 2019-06-30 ENCOUNTER — Other Ambulatory Visit: Payer: Self-pay

## 2019-06-30 VITALS — BP 146/78 | HR 67 | Ht 68.0 in | Wt 206.8 lb

## 2019-06-30 DIAGNOSIS — I350 Nonrheumatic aortic (valve) stenosis: Secondary | ICD-10-CM | POA: Insufficient documentation

## 2019-06-30 MED ORDER — PERFLUTREN LIPID MICROSPHERE
1.0000 mL | INTRAVENOUS | Status: AC | PRN
  Administered 2019-06-30: 2 mL via INTRAVENOUS

## 2019-06-30 NOTE — Patient Instructions (Addendum)

## 2019-08-25 ENCOUNTER — Ambulatory Visit: Payer: PPO

## 2019-09-02 ENCOUNTER — Ambulatory Visit: Payer: PPO

## 2019-09-05 DIAGNOSIS — E1121 Type 2 diabetes mellitus with diabetic nephropathy: Secondary | ICD-10-CM | POA: Diagnosis not present

## 2019-09-05 DIAGNOSIS — E785 Hyperlipidemia, unspecified: Secondary | ICD-10-CM | POA: Diagnosis not present

## 2019-09-05 DIAGNOSIS — E039 Hypothyroidism, unspecified: Secondary | ICD-10-CM | POA: Diagnosis not present

## 2019-09-05 DIAGNOSIS — E1165 Type 2 diabetes mellitus with hyperglycemia: Secondary | ICD-10-CM | POA: Diagnosis not present

## 2019-09-05 DIAGNOSIS — E1129 Type 2 diabetes mellitus with other diabetic kidney complication: Secondary | ICD-10-CM | POA: Diagnosis not present

## 2019-09-05 DIAGNOSIS — N182 Chronic kidney disease, stage 2 (mild): Secondary | ICD-10-CM | POA: Diagnosis not present

## 2019-09-05 DIAGNOSIS — E7800 Pure hypercholesterolemia, unspecified: Secondary | ICD-10-CM | POA: Diagnosis not present

## 2019-09-05 DIAGNOSIS — E0840 Diabetes mellitus due to underlying condition with diabetic neuropathy, unspecified: Secondary | ICD-10-CM | POA: Diagnosis not present

## 2019-09-05 DIAGNOSIS — E1140 Type 2 diabetes mellitus with diabetic neuropathy, unspecified: Secondary | ICD-10-CM | POA: Diagnosis not present

## 2019-09-05 DIAGNOSIS — I10 Essential (primary) hypertension: Secondary | ICD-10-CM | POA: Diagnosis not present

## 2019-09-15 ENCOUNTER — Ambulatory Visit: Payer: PPO

## 2019-09-19 DIAGNOSIS — E1140 Type 2 diabetes mellitus with diabetic neuropathy, unspecified: Secondary | ICD-10-CM | POA: Diagnosis not present

## 2019-09-19 DIAGNOSIS — E1121 Type 2 diabetes mellitus with diabetic nephropathy: Secondary | ICD-10-CM | POA: Diagnosis not present

## 2019-09-19 DIAGNOSIS — E7800 Pure hypercholesterolemia, unspecified: Secondary | ICD-10-CM | POA: Diagnosis not present

## 2019-09-19 DIAGNOSIS — E039 Hypothyroidism, unspecified: Secondary | ICD-10-CM | POA: Diagnosis not present

## 2019-09-19 DIAGNOSIS — N1831 Chronic kidney disease, stage 3a: Secondary | ICD-10-CM | POA: Diagnosis not present

## 2019-09-19 DIAGNOSIS — I350 Nonrheumatic aortic (valve) stenosis: Secondary | ICD-10-CM | POA: Diagnosis not present

## 2019-09-19 DIAGNOSIS — I10 Essential (primary) hypertension: Secondary | ICD-10-CM | POA: Diagnosis not present

## 2019-09-19 DIAGNOSIS — E663 Overweight: Secondary | ICD-10-CM | POA: Diagnosis not present

## 2019-10-04 ENCOUNTER — Other Ambulatory Visit: Payer: Self-pay | Admitting: Neurology

## 2019-10-20 ENCOUNTER — Other Ambulatory Visit: Payer: Self-pay | Admitting: Neurology

## 2019-11-07 DIAGNOSIS — R7989 Other specified abnormal findings of blood chemistry: Secondary | ICD-10-CM | POA: Diagnosis not present

## 2019-11-07 DIAGNOSIS — R946 Abnormal results of thyroid function studies: Secondary | ICD-10-CM | POA: Diagnosis not present

## 2019-11-29 DIAGNOSIS — L821 Other seborrheic keratosis: Secondary | ICD-10-CM | POA: Diagnosis not present

## 2019-11-29 DIAGNOSIS — L738 Other specified follicular disorders: Secondary | ICD-10-CM | POA: Diagnosis not present

## 2019-11-29 DIAGNOSIS — L570 Actinic keratosis: Secondary | ICD-10-CM | POA: Diagnosis not present

## 2019-11-29 DIAGNOSIS — D1801 Hemangioma of skin and subcutaneous tissue: Secondary | ICD-10-CM | POA: Diagnosis not present

## 2019-11-29 DIAGNOSIS — D225 Melanocytic nevi of trunk: Secondary | ICD-10-CM | POA: Diagnosis not present

## 2019-12-04 NOTE — Telephone Encounter (Signed)
A1c                  6.2      09/19/19 HLD                  42      09/19/19 LDL                   98      09/19/19 Triglycerides    150     09/19/19 T-cholesterol    166     09/19/19 TSH                  3.95    11/07/19

## 2019-12-15 DIAGNOSIS — E1129 Type 2 diabetes mellitus with other diabetic kidney complication: Secondary | ICD-10-CM | POA: Diagnosis not present

## 2019-12-15 DIAGNOSIS — E1165 Type 2 diabetes mellitus with hyperglycemia: Secondary | ICD-10-CM | POA: Diagnosis not present

## 2019-12-15 DIAGNOSIS — N183 Chronic kidney disease, stage 3 unspecified: Secondary | ICD-10-CM | POA: Diagnosis not present

## 2019-12-15 DIAGNOSIS — E7800 Pure hypercholesterolemia, unspecified: Secondary | ICD-10-CM | POA: Diagnosis not present

## 2019-12-15 DIAGNOSIS — E1140 Type 2 diabetes mellitus with diabetic neuropathy, unspecified: Secondary | ICD-10-CM | POA: Diagnosis not present

## 2019-12-15 DIAGNOSIS — E785 Hyperlipidemia, unspecified: Secondary | ICD-10-CM | POA: Diagnosis not present

## 2019-12-15 DIAGNOSIS — E1121 Type 2 diabetes mellitus with diabetic nephropathy: Secondary | ICD-10-CM | POA: Diagnosis not present

## 2019-12-15 DIAGNOSIS — E039 Hypothyroidism, unspecified: Secondary | ICD-10-CM | POA: Diagnosis not present

## 2019-12-15 DIAGNOSIS — E0840 Diabetes mellitus due to underlying condition with diabetic neuropathy, unspecified: Secondary | ICD-10-CM | POA: Diagnosis not present

## 2019-12-15 DIAGNOSIS — N182 Chronic kidney disease, stage 2 (mild): Secondary | ICD-10-CM | POA: Diagnosis not present

## 2019-12-15 DIAGNOSIS — I10 Essential (primary) hypertension: Secondary | ICD-10-CM | POA: Diagnosis not present

## 2020-01-06 ENCOUNTER — Other Ambulatory Visit: Payer: Self-pay | Admitting: Neurology

## 2020-01-09 ENCOUNTER — Telehealth: Payer: Self-pay | Admitting: Neurology

## 2020-01-09 NOTE — Telephone Encounter (Signed)
The following message was left with [employer] on 01/08/20 at 5:00 PM.  Caller states he needs a refill of his Gabapentin. Please call him, he states issue with pharmacy.

## 2020-01-09 NOTE — Telephone Encounter (Signed)
Left voicemail requesting pt to call back if he still needs assistance with his gabapentin refill.

## 2020-01-09 NOTE — Telephone Encounter (Signed)
Attempted to contact pt, went to voicemail, mailbox full.

## 2020-01-09 NOTE — Telephone Encounter (Signed)
Attempted to call patient about his pharmacy but his voice mail is full.

## 2020-01-16 NOTE — Progress Notes (Signed)
Cardiology Office Note   Date:  01/18/2020   ID:  ANDREJ SPAGNOLI, DOB 1946/07/01, MRN [id number]  PCP:  Seward Carol, MD  Cardiologist:   Jenkins Rouge, MD   No chief complaint on file.     History of Present Illness: Chad [name] is a 74 y.o. male first seen April 2019 regarding CAD risk.  Referred by Dr Delfina Redwood Reviewed his office note from 08/04/17 History of DM, HTN, hypothyroidism and HLD Has polyneuropathy especially feet not helped with gabapentin. On lyrica as well Been diabetic for 11 years on oral meds last A1c 6.2 Neurologist wanted to start TCA for his neuropathy. QT ok   LDL 98 A1c 6.2   His activity is limited by neuropathy in feet No chest pain Exertional dyspnea No palpitations or dypspnea Wife indicates history of murmur patient not aware.    ECG 08/27/17 normal sinus QT 398  Echo 09/03/17  EF 60-65% mild AS mean gradient 18 mmHg peak 31 mmHg Normal ETT 09/03/17 maximum HR was only 110 73% PMHR Calcium score 795 81 st percentile dense in LAD he is on statin   Started on Pamelor for depression    [facility]riends with Chad [name] one of my patients Met at [facility]   F/U echo done 06/30/19 with mean gradient similar 19 mmHg and peak 35 mmHg   Some exertional dyspnea and chest can be tight with this  Has high BS still and some neuropathy in feet    Past Medical History:  Diagnosis Date   CKD (chronic kidney disease)    Colon polyps    Diabetes ([place])    Diabetic polyneuropathy associated with type 2 diabetes mellitus ([place]) 01/15/2017   GERD (gastroesophageal reflux disease)    Hypercholesterolemia    Hypertension    Hypothyroidism    Polyneuropathy    Proteinuria     History reviewed. No pertinent surgical history.   Current Outpatient Medications  Medication Sig Dispense Refill   gabapentin (NEURONTIN) 300 MG capsule Take 1 tab in the morning, 1 tab in the afternoon, and 2 at bedtime. 360 capsule 3   glimepiride (AMARYL) 1 MG tablet Take 1 mg by mouth  daily with breakfast.      hydrochlorothiazide (HYDRODIURIL) 25 MG tablet Take 25 mg by mouth daily.      levothyroxine (SYNTHROID, LEVOTHROID) 75 MCG tablet Take 75 mcg by mouth daily before breakfast.      lisinopril (PRINIVIL,ZESTRIL) 40 MG tablet TAKE 1 TABLET BY MOUTH DAILY     metFORMIN (GLUCOPHAGE) 1000 MG tablet Take 1,000 mg by mouth 2 (two) times daily with a meal.      metoprolol succinate (TOPROL-XL) 25 MG 24 hr tablet Take 1 tablet (25 mg total) by mouth daily. 90 tablet 3   nortriptyline (PAMELOR) 10 MG capsule TAKE THREE CAPSULES BY MOUTH EVERY NIGHT AT BEDTIME 270 capsule 0   rosuvastatin (CRESTOR) 40 MG tablet Take 1 tablet by mouth daily.     No current facility-administered medications for this visit.    Allergies:   Patient has no known allergies.    Social History:  The patient  reports that he has quit smoking. He has never used smokeless tobacco. He reports current alcohol use. He reports that he does not use drugs.   Family History:  The patient's family history is not on file.    ROS:  Please see the history of present illness.   Otherwise, review of systems are positive for none.  All other systems are reviewed and negative.    PHYSICAL EXAM: VS:  BP 134/72    Pulse 69    Ht '5\' 8"'  (1.727 m)    Wt 203 lb (92.1 kg)    SpO2 98%    BMI 30.87 kg/m  , BMI Body mass index is 30.87 kg/m. Affect appropriate Healthy:  appears stated age 38: normal Neck supple with no adenopathy JVP normal no bruits no thyromegaly Lungs clear with no wheezing and good diaphragmatic motion Heart:  S1/S2 AS  murmur, no rub, gallop or click PMI normal Abdomen: benighn, BS positve, no tenderness, no AAA no bruit.  No HSM or HJR Distal pulses intact with no bruits No edema Neuro non-focal Skin warm and dry No muscular weakness   EKG:  03/28/19 SR rate 77 QT 378    Recent Labs: No results found for requested labs within last 8760 hours.    Lipid Panel No results  found for: CHOL, TRIG, HDL, CHOLHDL, VLDL, LDLCALC, LDLDIRECT    Wt Readings from Last 3 Encounters:  01/18/20 203 lb (92.1 kg)  06/30/19 206 lb 12.8 oz (93.8 kg)  03/28/19 205 lb 6.4 oz (93.2 kg)      Other studies Reviewed: Additional studies/ records that were reviewed today include: Office notes neurology and primary .    ASSESSMENT AND PLAN:  1.  HTN Toprol added September 2020 improved needs to get back to pre covid exercise routine  2. DM Discussed low carb diet.  Target hemoglobin A1c is 6.5 or less.  Continue current medications. A1c 6.2 3. HLD on statin labs with primary LDL 98  4. Neuropathy f/u neuro encouraged him to get head CT to r/o NPH or other pathology QT normal on Pamelor with improvement  5. Thyroid on replacement TSH normal  6. CAD:  Risk given age and long standing DM with secondary end organ damge. ECG is normal including QT so ok to use TCA for Rx neuropathy. Normal ETT  2019 high calcium socre 795 especially I LAD  Will order lexiscan myovue to further risk stratify   7. Murmur: Mild AS Echo 03/28/19 reviewed similar gradients on echo 06/30/19 although I would grade this as mild to moderate f/u echo December 2021     Current medicines are reviewed at length with the patient today.  The patient does not have concerns regarding medicines.  The following changes have been made:  no change  Labs/ tests ordered today include:   Echo December 2021  Lexiscan Myovue now    Disposition:   FU with cardiology December if stress test ok     Signed, Jenkins Rouge, MD  01/18/2020 2:51 PM    [facility] [address], [place], [place]  [address] Phone: [phone]; Fax: [phone]

## 2020-01-18 ENCOUNTER — Encounter: Payer: Self-pay | Admitting: Cardiovascular Disease

## 2020-01-18 ENCOUNTER — Ambulatory Visit (INDEPENDENT_AMBULATORY_CARE_PROVIDER_SITE_OTHER): Payer: PPO | Admitting: Cardiovascular Disease

## 2020-01-18 ENCOUNTER — Other Ambulatory Visit: Payer: Self-pay

## 2020-01-18 VITALS — BP 134/72 | HR 69 | Ht 68.0 in | Wt 203.0 lb

## 2020-01-18 DIAGNOSIS — I2510 Atherosclerotic heart disease of native coronary artery without angina pectoris: Secondary | ICD-10-CM | POA: Diagnosis not present

## 2020-01-18 DIAGNOSIS — R079 Chest pain, unspecified: Secondary | ICD-10-CM | POA: Diagnosis not present

## 2020-01-18 DIAGNOSIS — R011 Cardiac murmur, unspecified: Secondary | ICD-10-CM | POA: Diagnosis not present

## 2020-01-18 NOTE — Patient Instructions (Addendum)
Medication Instructions:  *If you need a refill on your cardiac medications before your next appointment, please call your pharmacy*  Lab Work: If you have labs (blood work) drawn today and your tests are completely normal, you will receive your results only by: Marland Kitchen MyChart Message (if you have MyChart) OR . A paper copy in the mail If you have any lab test that is abnormal or we need to change your treatment, we will call you to review the results.  Testing/Procedures: Your physician has requested that you have a lexiscan myoview. For further information please visit [email or web address]. Please follow instruction sheet, as given.  Your physician has requested that you have an echocardiogram in December same day as office visit.  Echocardiography is a painless test that uses sound waves to create images of your heart. It provides your doctor with information about the size and shape of your heart and how well your heart's chambers and valves are working. This procedure takes approximately one hour. There are no restrictions for this procedure.   Follow-Up: At [facility], you and your health needs are our priority.  As part of our continuing mission to provide you with exceptional heart care, we have created designated Provider Care Teams.  These Care Teams include your primary Cardiologist (physician) and Advanced Practice Providers (APPs -  Physician Assistants and Nurse Practitioners) who all work together to provide you with the care you need, when you need it.  We recommend signing up for the patient portal called "MyChart".  Sign up information is provided on this After Visit Summary.  MyChart is used to connect with patients for Virtual Visits (Telemedicine).  Patients are able to view lab/test results, encounter notes, upcoming appointments, etc.  Non-urgent messages can be sent to your provider as well.   To learn more about what you can do with MyChart, go to [email or web address].     Your next appointment:   6 months  The format for your next appointment:   In Person  Provider:   You may see Jenkins Rouge, MD or one of the following Advanced Practice Providers on your designated Care Team:    Truitt Merle, NP  Cecilie Kicks, NP  Kathyrn Drown, NP

## 2020-01-23 DIAGNOSIS — E119 Type 2 diabetes mellitus without complications: Secondary | ICD-10-CM | POA: Diagnosis not present

## 2020-01-23 DIAGNOSIS — H524 Presbyopia: Secondary | ICD-10-CM | POA: Diagnosis not present

## 2020-01-23 DIAGNOSIS — H2513 Age-related nuclear cataract, bilateral: Secondary | ICD-10-CM | POA: Diagnosis not present

## 2020-01-24 ENCOUNTER — Telehealth (HOSPITAL_COMMUNITY): Payer: Self-pay | Admitting: *Deleted

## 2020-01-24 ENCOUNTER — Encounter (HOSPITAL_COMMUNITY): Payer: Self-pay | Admitting: *Deleted

## 2020-01-24 NOTE — Telephone Encounter (Signed)
Patient given detailed instructions per Myocardial Perfusion Study Information Sheet for the test on 01/31/2020 at 1045. Patient notified to arrive 15 minutes early and that it is imperative to arrive on time for appointment to keep from having the test rescheduled.  If you need to cancel or reschedule your appointment, please call the office within 24 hours of your appointment. . Patient verbalized understanding.[facility] Mychart letter sent with instructions.

## 2020-01-31 ENCOUNTER — Other Ambulatory Visit: Payer: Self-pay

## 2020-01-31 ENCOUNTER — Ambulatory Visit (HOSPITAL_COMMUNITY): Payer: PPO | Attending: Cardiology

## 2020-01-31 DIAGNOSIS — R079 Chest pain, unspecified: Secondary | ICD-10-CM | POA: Diagnosis not present

## 2020-01-31 DIAGNOSIS — R011 Cardiac murmur, unspecified: Secondary | ICD-10-CM | POA: Diagnosis not present

## 2020-01-31 DIAGNOSIS — I2510 Atherosclerotic heart disease of native coronary artery without angina pectoris: Secondary | ICD-10-CM | POA: Insufficient documentation

## 2020-01-31 LAB — MYOCARDIAL PERFUSION IMAGING
LV dias vol: 83 mL (ref 62–150)
LV sys vol: 34 mL
Peak HR: 75 {beats}/min
Rest HR: 59 {beats}/min
SDS: 1
SRS: 0
SSS: 1
TID: 1.13

## 2020-01-31 MED ORDER — REGADENOSON 0.4 MG/5ML IV SOLN
0.4000 mg | Freq: Once | INTRAVENOUS | Status: AC
  Administered 2020-01-31: 0.4 mg via INTRAVENOUS

## 2020-01-31 MED ORDER — TECHNETIUM TC 99M TETROFOSMIN IV KIT
10.8000 | PACK | Freq: Once | INTRAVENOUS | Status: AC | PRN
  Administered 2020-01-31: 10.8 via INTRAVENOUS
  Filled 2020-01-31: qty 11

## 2020-01-31 MED ORDER — TECHNETIUM TC 99M TETROFOSMIN IV KIT
32.0000 | PACK | Freq: Once | INTRAVENOUS | Status: AC | PRN
  Administered 2020-01-31: 32 via INTRAVENOUS
  Filled 2020-01-31: qty 32

## 2020-03-19 ENCOUNTER — Other Ambulatory Visit: Payer: Self-pay | Admitting: Cardiovascular Disease

## 2020-03-26 DIAGNOSIS — N182 Chronic kidney disease, stage 2 (mild): Secondary | ICD-10-CM | POA: Diagnosis not present

## 2020-03-26 DIAGNOSIS — E1121 Type 2 diabetes mellitus with diabetic nephropathy: Secondary | ICD-10-CM | POA: Diagnosis not present

## 2020-03-26 DIAGNOSIS — N183 Chronic kidney disease, stage 3 unspecified: Secondary | ICD-10-CM | POA: Diagnosis not present

## 2020-03-26 DIAGNOSIS — E039 Hypothyroidism, unspecified: Secondary | ICD-10-CM | POA: Diagnosis not present

## 2020-03-26 DIAGNOSIS — E7800 Pure hypercholesterolemia, unspecified: Secondary | ICD-10-CM | POA: Diagnosis not present

## 2020-03-26 DIAGNOSIS — E785 Hyperlipidemia, unspecified: Secondary | ICD-10-CM | POA: Diagnosis not present

## 2020-03-26 DIAGNOSIS — E1140 Type 2 diabetes mellitus with diabetic neuropathy, unspecified: Secondary | ICD-10-CM | POA: Diagnosis not present

## 2020-03-26 DIAGNOSIS — E1165 Type 2 diabetes mellitus with hyperglycemia: Secondary | ICD-10-CM | POA: Diagnosis not present

## 2020-03-26 DIAGNOSIS — I10 Essential (primary) hypertension: Secondary | ICD-10-CM | POA: Diagnosis not present

## 2020-03-26 DIAGNOSIS — E1129 Type 2 diabetes mellitus with other diabetic kidney complication: Secondary | ICD-10-CM | POA: Diagnosis not present

## 2020-03-26 DIAGNOSIS — E0840 Diabetes mellitus due to underlying condition with diabetic neuropathy, unspecified: Secondary | ICD-10-CM | POA: Diagnosis not present

## 2020-04-07 ENCOUNTER — Other Ambulatory Visit: Payer: Self-pay | Admitting: Neurology

## 2020-04-08 ENCOUNTER — Telehealth: Payer: Self-pay | Admitting: Neurology

## 2020-04-08 DIAGNOSIS — I358 Other nonrheumatic aortic valve disorders: Secondary | ICD-10-CM | POA: Diagnosis not present

## 2020-04-08 DIAGNOSIS — Z0000 Encounter for general adult medical examination without abnormal findings: Secondary | ICD-10-CM | POA: Diagnosis not present

## 2020-04-08 DIAGNOSIS — E663 Overweight: Secondary | ICD-10-CM | POA: Diagnosis not present

## 2020-04-08 DIAGNOSIS — Z7984 Long term (current) use of oral hypoglycemic drugs: Secondary | ICD-10-CM | POA: Diagnosis not present

## 2020-04-08 DIAGNOSIS — Z125 Encounter for screening for malignant neoplasm of prostate: Secondary | ICD-10-CM | POA: Diagnosis not present

## 2020-04-08 DIAGNOSIS — H6122 Impacted cerumen, left ear: Secondary | ICD-10-CM | POA: Diagnosis not present

## 2020-04-08 DIAGNOSIS — E1140 Type 2 diabetes mellitus with diabetic neuropathy, unspecified: Secondary | ICD-10-CM | POA: Diagnosis not present

## 2020-04-08 DIAGNOSIS — I10 Essential (primary) hypertension: Secondary | ICD-10-CM | POA: Diagnosis not present

## 2020-04-08 DIAGNOSIS — E1122 Type 2 diabetes mellitus with diabetic chronic kidney disease: Secondary | ICD-10-CM | POA: Diagnosis not present

## 2020-04-08 DIAGNOSIS — E7800 Pure hypercholesterolemia, unspecified: Secondary | ICD-10-CM | POA: Diagnosis not present

## 2020-04-08 DIAGNOSIS — N1832 Chronic kidney disease, stage 3b: Secondary | ICD-10-CM | POA: Diagnosis not present

## 2020-04-08 NOTE — Telephone Encounter (Signed)
Returned patients call and left message for a call back.

## 2020-04-08 NOTE — Telephone Encounter (Signed)
Patient left message on the VM stating he need to speak to someone about medication that was denied please call

## 2020-04-09 NOTE — Telephone Encounter (Signed)
Called patient and left message for a call back. Need to inform patient that refill was denied due to patient needing a f/u appt. Patient's last appt with Dr. Posey Pronto was 01/06/19 and patient was instructed to f/u in 6 months which he has not.

## 2020-04-09 NOTE — Telephone Encounter (Signed)
Patient returned call to Mahina. 

## 2020-04-09 NOTE — Telephone Encounter (Signed)
Called patient and left a message for call back  

## 2020-04-10 MED ORDER — GABAPENTIN 300 MG PO CAPS
ORAL_CAPSULE | ORAL | 0 refills | Status: DC
Start: 2020-04-10 — End: 2020-04-26

## 2020-04-10 NOTE — Telephone Encounter (Signed)
Called patient and informed him that a refill has been sent to his pharmacy and that he needs to schedule a f/u. Patient was transferred to the front to schedule a f.u with Dr. Posey Pronto.

## 2020-04-10 NOTE — Telephone Encounter (Signed)
The following message was left with [employer] on 04/10/20 at 12:14 PM.  Caller states he is calling because he is returning a call from the office.

## 2020-04-10 NOTE — Telephone Encounter (Signed)
Please inform patient I have sent 90-day supply of gabapentin, but to call the office for follow-up.  Thanks.

## 2020-04-15 DIAGNOSIS — R7989 Other specified abnormal findings of blood chemistry: Secondary | ICD-10-CM | POA: Diagnosis not present

## 2020-04-22 DIAGNOSIS — E1121 Type 2 diabetes mellitus with diabetic nephropathy: Secondary | ICD-10-CM | POA: Diagnosis not present

## 2020-04-22 DIAGNOSIS — N179 Acute kidney failure, unspecified: Secondary | ICD-10-CM | POA: Diagnosis not present

## 2020-04-22 DIAGNOSIS — Z7984 Long term (current) use of oral hypoglycemic drugs: Secondary | ICD-10-CM | POA: Diagnosis not present

## 2020-04-22 DIAGNOSIS — N1832 Chronic kidney disease, stage 3b: Secondary | ICD-10-CM | POA: Diagnosis not present

## 2020-04-26 ENCOUNTER — Encounter: Payer: Self-pay | Admitting: Neurology

## 2020-04-26 ENCOUNTER — Other Ambulatory Visit (INDEPENDENT_AMBULATORY_CARE_PROVIDER_SITE_OTHER): Payer: PPO

## 2020-04-26 ENCOUNTER — Ambulatory Visit (INDEPENDENT_AMBULATORY_CARE_PROVIDER_SITE_OTHER): Payer: PPO | Admitting: Neurology

## 2020-04-26 ENCOUNTER — Other Ambulatory Visit: Payer: Self-pay

## 2020-04-26 VITALS — BP 130/74 | HR 60 | Ht 68.0 in | Wt 201.0 lb

## 2020-04-26 DIAGNOSIS — E1142 Type 2 diabetes mellitus with diabetic polyneuropathy: Secondary | ICD-10-CM

## 2020-04-26 DIAGNOSIS — Z20828 Contact with and (suspected) exposure to other viral communicable diseases: Secondary | ICD-10-CM | POA: Diagnosis not present

## 2020-04-26 DIAGNOSIS — D472 Monoclonal gammopathy: Secondary | ICD-10-CM

## 2020-04-26 MED ORDER — NORTRIPTYLINE HCL 10 MG PO CAPS
ORAL_CAPSULE | ORAL | 3 refills | Status: DC
Start: 2020-04-26 — End: 2021-04-22

## 2020-04-26 MED ORDER — GABAPENTIN 300 MG PO CAPS
ORAL_CAPSULE | ORAL | 3 refills | Status: DC
Start: 2020-04-26 — End: 2020-08-19

## 2020-04-26 NOTE — Patient Instructions (Addendum)
Check labs  Continue medications as you are taking   Return to clinic in 1 year

## 2020-04-26 NOTE — Progress Notes (Signed)
Follow-up Visit   Date: 04/26/20    PRIMUS GRITTON MRN: [id number] DOB: 02-02-1946   [name] is a 74 y.o. right-handed Caucasian male with well-controlled diabetes mellitus, hypertension, hypothyroidism, and hyperlipidemia returning to the clinic for follow-up of neuropathy.    History of present illness: Initial visit 12/2016:  Starting around 2016, he starting having pins and needle sensation over the soles and toes.  Symptoms are constant and worse with walking. He walks unassisted and has not had any falls. He has NCS/EMG of the legs performed by Dr. Domingo Cocking in June 2017 which showed severe sensorimotor polyneuropathy.  He has previously tried gabapentin, but does not recall the dose.  He takes Lyrica 200mg  twice daily and denies any side effects. He was diagnosed with diabetes in 2008 and has been managed with oral medications.  His last HbA1c 6.2.  He has also tried accupuncture.     UPDATE 04/26/2020:  He is here for 1 year follow-up visit.  His neuropathy has largely been stable and well-controlled on gabapentin 300mg  TID and nortriptyline 30mg  at bedtime. At his last visit, I increased nortriptyline back to 30mg  due to worsening paresthesias, which has helped.  He did not experience constipation with medication change.  He continues to have constant low grade tingling, but it is manageable.  No weakness, falls, or worsening tingling.  He is now on insulin and tolerating this well, last HbA1c ~ 7.  Medications:  Current Outpatient Medications on File Prior to Visit  Medication Sig Dispense Refill  . gabapentin (NEURONTIN) 300 MG capsule Take 1 tab in the morning, 1 tab in the afternoon, and 2 at bedtime. 360 capsule 0  . hydrochlorothiazide (HYDRODIURIL) 25 MG tablet Take 25 mg by mouth daily.     . insulin glargine (LANTUS) 100 UNIT/ML injection Inject 10 Units into the skin daily.    Marland Kitchen levothyroxine (SYNTHROID, LEVOTHROID) 75 MCG tablet Take 75 mcg by mouth  daily before breakfast.     . lisinopril (PRINIVIL,ZESTRIL) 40 MG tablet TAKE 1 TABLET BY MOUTH DAILY    . metoprolol succinate (TOPROL-XL) 25 MG 24 hr tablet TAKE ONE TABLET BY MOUTH DAILY 90 tablet 3  . nortriptyline (PAMELOR) 10 MG capsule TAKE THREE CAPSULES BY MOUTH EVERY NIGHT AT BEDTIME 270 capsule 0  . rosuvastatin (CRESTOR) 40 MG tablet Take 1 tablet by mouth daily.    Marland Kitchen glimepiride (AMARYL) 1 MG tablet Take 1 mg by mouth daily with breakfast.  (Patient not taking: Reported on 04/26/2020)    . metFORMIN (GLUCOPHAGE) 1000 MG tablet Take 1,000 mg by mouth 2 (two) times daily with a meal.  (Patient not taking: Reported on 04/26/2020)     No current facility-administered medications on file prior to visit.    Allergies: No Known Allergies   Vital Signs:  BP 130/74   Pulse 60   Ht 5\' 8"  (1.727 m)   Wt 201 lb (91.2 kg)   SpO2 99%   BMI 30.56 kg/m    Neurological Exam: MENTAL STATUS including orientation to time, place, person, recent and remote memory, attention span and concentration, language, and fund of knowledge is normal.  Speech is not dysarthric.  CRANIAL NERVES:  Pupils equal round and reactive to light.  Normal conjugate, extra-ocular eye movements in all directions of gaze.     MOTOR:  Motor strength is 5/5 in all extremities.  No pronator drift.  Tone is normal.    MSRs:  Reflexes are  2+/4 throughout, except absent Achilles.  SENSORY:  Reduced vibration distal to ankles bilaterally.  Hyperesthesia with pin prick and light touch in the feet.   COORDINATION/GAIT: Gait appears stable, stooped, unassisted.  Stressed and tandem gait intact.  DATA: NCS/EMG of the legs 01/09/2016 performed elsewhere:  Severe sensory and motor polyneuropathy with chronic denervation consistent with patient's known history of diabetes mellitus.   Labs 12/2016:  vitamin B12 298, copper 95, SPEP with IFE A poorly-defined area of restricted protein mobility is detected and is reactive with IgG  and Kappa antisera, folate >24.0    IMPRESSION/PLAN: 1.  Peripheral neuropathy contributed by diabetes, HbA1c ~7, now on insulin.  Clinically stable.   - Previously tried:  Lyrica  - Continue gabapentin 300mg  TID  - Continue nortriptyline 30mg  at bedtime  - Patient educated on daily foot inspection, fall prevention, and safety precautions around the home.  2. Faint IgG kappa monoclonal protein, no M protein  - Check SPEP and UPEP protein with IFE   Return to clinic in 1 year   Thank you for allowing me to participate in patient's care.  If I can answer any additional questions, I would be pleased to do so.    Sincerely,    Chozen Latulippe K. Posey Pronto, DO

## 2020-04-29 DIAGNOSIS — N1831 Chronic kidney disease, stage 3a: Secondary | ICD-10-CM | POA: Diagnosis not present

## 2020-04-30 LAB — IMMUNOFIXATION ELECTROPHORESIS
IgG (Immunoglobin G), Serum: 671 mg/dL (ref 600–1540)
IgM, Serum: 57 mg/dL (ref 50–300)
Immunoglobulin A: 128 mg/dL (ref 70–320)

## 2020-04-30 LAB — PROTEIN ELECTROPHORESIS, SERUM
Albumin ELP: 3.7 g/dL — ABNORMAL LOW (ref 3.8–4.8)
Alpha 1: 0.4 g/dL — ABNORMAL HIGH (ref 0.2–0.3)
Alpha 2: 1 g/dL — ABNORMAL HIGH (ref 0.5–0.9)
Beta 2: 0.3 g/dL (ref 0.2–0.5)
Beta Globulin: 0.4 g/dL (ref 0.4–0.6)
Gamma Globulin: 0.6 g/dL — ABNORMAL LOW (ref 0.8–1.7)
Total Protein: 6.4 g/dL (ref 6.1–8.1)

## 2020-04-30 LAB — PROTEIN ELECTRO, RANDOM URINE
Albumin ELP, Urine: 62.5 %
Alpha-1-Globulin, U: 5.8 %
Alpha-2-Globulin, U: 8 %
Beta Globulin, U: 16.8 %
Gamma Globulin, U: 6.8 %
Protein, Ur: 26.1 mg/dL

## 2020-04-30 LAB — IMMUNOFIXATION, URINE

## 2020-05-20 DIAGNOSIS — E1140 Type 2 diabetes mellitus with diabetic neuropathy, unspecified: Secondary | ICD-10-CM | POA: Diagnosis not present

## 2020-05-20 DIAGNOSIS — E039 Hypothyroidism, unspecified: Secondary | ICD-10-CM | POA: Diagnosis not present

## 2020-05-20 DIAGNOSIS — R011 Cardiac murmur, unspecified: Secondary | ICD-10-CM | POA: Diagnosis not present

## 2020-05-20 DIAGNOSIS — E1122 Type 2 diabetes mellitus with diabetic chronic kidney disease: Secondary | ICD-10-CM | POA: Diagnosis not present

## 2020-05-20 DIAGNOSIS — N189 Chronic kidney disease, unspecified: Secondary | ICD-10-CM | POA: Diagnosis not present

## 2020-05-20 DIAGNOSIS — E785 Hyperlipidemia, unspecified: Secondary | ICD-10-CM | POA: Diagnosis not present

## 2020-05-20 DIAGNOSIS — N179 Acute kidney failure, unspecified: Secondary | ICD-10-CM | POA: Diagnosis not present

## 2020-05-20 DIAGNOSIS — I10 Essential (primary) hypertension: Secondary | ICD-10-CM | POA: Diagnosis not present

## 2020-05-23 ENCOUNTER — Other Ambulatory Visit: Payer: Self-pay | Admitting: Internal Medicine

## 2020-05-23 DIAGNOSIS — E1140 Type 2 diabetes mellitus with diabetic neuropathy, unspecified: Secondary | ICD-10-CM

## 2020-05-23 DIAGNOSIS — Z7984 Long term (current) use of oral hypoglycemic drugs: Secondary | ICD-10-CM | POA: Diagnosis not present

## 2020-05-23 DIAGNOSIS — E1122 Type 2 diabetes mellitus with diabetic chronic kidney disease: Secondary | ICD-10-CM

## 2020-05-23 DIAGNOSIS — E1121 Type 2 diabetes mellitus with diabetic nephropathy: Secondary | ICD-10-CM | POA: Diagnosis not present

## 2020-05-23 DIAGNOSIS — N179 Acute kidney failure, unspecified: Secondary | ICD-10-CM

## 2020-06-03 DIAGNOSIS — N1832 Chronic kidney disease, stage 3b: Secondary | ICD-10-CM | POA: Diagnosis not present

## 2020-06-03 DIAGNOSIS — I10 Essential (primary) hypertension: Secondary | ICD-10-CM | POA: Diagnosis not present

## 2020-06-03 DIAGNOSIS — E1122 Type 2 diabetes mellitus with diabetic chronic kidney disease: Secondary | ICD-10-CM | POA: Diagnosis not present

## 2020-06-06 ENCOUNTER — Ambulatory Visit
Admission: RE | Admit: 2020-06-06 | Discharge: 2020-06-06 | Disposition: A | Discharge status: Home / Self Care | Payer: PPO | Source: Ambulatory Visit | Attending: Internal Medicine | Admitting: Internal Medicine

## 2020-06-06 DIAGNOSIS — N200 Calculus of kidney: Secondary | ICD-10-CM | POA: Diagnosis not present

## 2020-06-06 DIAGNOSIS — N281 Cyst of kidney, acquired: Secondary | ICD-10-CM | POA: Diagnosis not present

## 2020-06-06 DIAGNOSIS — E1140 Type 2 diabetes mellitus with diabetic neuropathy, unspecified: Secondary | ICD-10-CM

## 2020-06-06 DIAGNOSIS — N179 Acute kidney failure, unspecified: Secondary | ICD-10-CM | POA: Diagnosis not present

## 2020-06-06 DIAGNOSIS — E1122 Type 2 diabetes mellitus with diabetic chronic kidney disease: Secondary | ICD-10-CM

## 2020-06-06 DIAGNOSIS — N189 Chronic kidney disease, unspecified: Secondary | ICD-10-CM | POA: Diagnosis not present

## 2020-06-17 DIAGNOSIS — N1832 Chronic kidney disease, stage 3b: Secondary | ICD-10-CM | POA: Diagnosis not present

## 2020-06-25 DIAGNOSIS — N184 Chronic kidney disease, stage 4 (severe): Secondary | ICD-10-CM | POA: Diagnosis not present

## 2020-06-25 DIAGNOSIS — E1140 Type 2 diabetes mellitus with diabetic neuropathy, unspecified: Secondary | ICD-10-CM | POA: Diagnosis not present

## 2020-06-25 DIAGNOSIS — E1121 Type 2 diabetes mellitus with diabetic nephropathy: Secondary | ICD-10-CM | POA: Diagnosis not present

## 2020-06-25 DIAGNOSIS — E7800 Pure hypercholesterolemia, unspecified: Secondary | ICD-10-CM | POA: Diagnosis not present

## 2020-06-25 DIAGNOSIS — E039 Hypothyroidism, unspecified: Secondary | ICD-10-CM | POA: Diagnosis not present

## 2020-06-25 DIAGNOSIS — N182 Chronic kidney disease, stage 2 (mild): Secondary | ICD-10-CM | POA: Diagnosis not present

## 2020-06-25 DIAGNOSIS — I10 Essential (primary) hypertension: Secondary | ICD-10-CM | POA: Diagnosis not present

## 2020-06-25 DIAGNOSIS — E1129 Type 2 diabetes mellitus with other diabetic kidney complication: Secondary | ICD-10-CM | POA: Diagnosis not present

## 2020-06-25 DIAGNOSIS — E1165 Type 2 diabetes mellitus with hyperglycemia: Secondary | ICD-10-CM | POA: Diagnosis not present

## 2020-06-25 DIAGNOSIS — E785 Hyperlipidemia, unspecified: Secondary | ICD-10-CM | POA: Diagnosis not present

## 2020-06-25 DIAGNOSIS — E1122 Type 2 diabetes mellitus with diabetic chronic kidney disease: Secondary | ICD-10-CM | POA: Diagnosis not present

## 2020-06-25 DIAGNOSIS — N183 Chronic kidney disease, stage 3 unspecified: Secondary | ICD-10-CM | POA: Diagnosis not present

## 2020-06-28 ENCOUNTER — Ambulatory Visit: Payer: PPO | Admitting: Neurology

## 2020-06-28 NOTE — Progress Notes (Signed)
Cardiology Office Note   Date:  07/02/2020   ID:  Chad [name], DOB 03-27-1946, MRN [id number]  PCP:  Chad Carol, MD  Cardiologist:   Chad Rouge, MD   No chief complaint on file.     History of Present Illness:  74 y.o. with DM, HTN, HLD, hypothyroidism and polyneuropathy   ECG 08/27/17 normal sinus QT 398  Echo 06/30/19  EF 60-65% mild AS mean gradient 19 mmHg peak 35 mmHg Normal ETT 09/03/17 maximum HR was only 110 73% PMHR Calcium score 795 81 st percentile dense in LAD he is on statin  Myovue Lexiscan normal 01/31/20 no ischemia EF 60%   Started on Pamelor for depression    [facility] with Chad [name] one of my patients Met at [facility]   Echo 07/02/2020 reviewed progression of AS to moderate mean gradient 29 mmHg peak 50 mmHg  And DVI 0.28 Discussed progression of dx and f/u echo in 6 months He is not symptomatic Also Long discussion about likely need for TAVR evaluation within next 2-3 years   Had diuretic and ace held for stage 3 CRF Cr now around 1.6   BP up discussed starting norvasc    Past Medical History:  Diagnosis Date  . CKD (chronic kidney disease)   . Colon polyps   . Diabetes ([place])   . Diabetic polyneuropathy associated with type 2 diabetes mellitus ([place]) 01/15/2017  . GERD (gastroesophageal reflux disease)   . Hypercholesterolemia   . Hypertension   . Hypothyroidism   . Polyneuropathy   . Proteinuria     No past surgical history on file.   Current Outpatient Medications  Medication Sig Dispense Refill  . gabapentin (NEURONTIN) 300 MG capsule Take 1 tab in the morning, 1 tab in the afternoon, and 1 at bedtime. 360 capsule 3  . glimepiride (AMARYL) 1 MG tablet Take 1 mg by mouth daily with breakfast.  (Patient not taking: Reported on 04/26/2020)    . hydrochlorothiazide (HYDRODIURIL) 25 MG tablet Take 25 mg by mouth daily.     . insulin glargine (LANTUS) 100 UNIT/ML injection Inject 10 Units into the skin daily.    Marland Kitchen levothyroxine (SYNTHROID,  LEVOTHROID) 75 MCG tablet Take 75 mcg by mouth daily before breakfast.     . lisinopril (PRINIVIL,ZESTRIL) 40 MG tablet TAKE 1 TABLET BY MOUTH DAILY    . metFORMIN (GLUCOPHAGE) 1000 MG tablet Take 1,000 mg by mouth 2 (two) times daily with a meal.  (Patient not taking: Reported on 04/26/2020)    . metoprolol succinate (TOPROL-XL) 25 MG 24 hr tablet TAKE ONE TABLET BY MOUTH DAILY 90 tablet 3  . nortriptyline (PAMELOR) 10 MG capsule TAKE THREE CAPSULES BY MOUTH EVERY NIGHT AT BEDTIME 270 capsule 3  . rosuvastatin (CRESTOR) 40 MG tablet Take 1 tablet by mouth daily.     No current facility-administered medications for this visit.    Allergies:   Patient has no known allergies.    Social History:  The patient  reports that he has quit smoking. He has never used smokeless tobacco. He reports current alcohol use. He reports that he does not use drugs.   Family History:  The patient's family history is not on file.    ROS:  Please see the history of present illness.   Otherwise, review of systems are positive for none.   All other systems are reviewed and negative.    PHYSICAL EXAM: VS:  There were no vitals taken for this visit. ,  BMI There is no height or weight on file to calculate BMI. Affect appropriate Healthy:  appears stated age 43: normal Neck supple with no adenopathy JVP normal no bruits no thyromegaly Lungs clear with no wheezing and good diaphragmatic motion Heart:  S1/S2 AS  murmur, no rub, gallop or click PMI normal Abdomen: benighn, BS positve, no tenderness, no AAA no bruit.  No HSM or HJR Distal pulses intact with no bruits No edema Neuro sensory neuropathy in arms feet  Skin warm and dry No muscular weakness   EKG:  03/28/19 SR rate 77 QT 378    Recent Labs: No results found for requested labs within last 8760 hours.    Lipid Panel No results found for: CHOL, TRIG, HDL, CHOLHDL, VLDL, LDLCALC, LDLDIRECT    Wt Readings from Last 3 Encounters:  04/26/20  91.2 kg  01/31/20 92.1 kg  01/18/20 92.1 kg      Other studies Reviewed: Additional studies/ records that were reviewed today include: Office notes neurology and primary .    ASSESSMENT AND PLAN:  1.  HTN: diuretic and ACE held due to CRF Add norvasc 5 mg and f/u with Chad [name]  2. DM Discussed low carb diet.  Target hemoglobin A1c is 6.5 or less.  Continue current medications. A1c 6.2 3. HLD on statin labs with primary  4. Neuropathy f/u neuro Chad [name] severe sensorimotor polyneuropathy Rx with gabapentin and nortriptyline  5. Thyroid on replacement TSH normal  6. CAD:  High calcium score 08/27/17 795 81 st percentile dense in LAD Normal lexiscan myovue 01/31/20 stable  7. Murmur: moderate AS with progression of dx f/u echo in 6 months discussed possible future need of TAVR    Current medicines are reviewed at length with the patient today.  The patient does not have concerns regarding medicines.  The following changes have been made:  no change  Labs/ tests ordered today include:   Echo for AS 6 months    Disposition:   FU with cardiology  6 months     Signed, Chad Rouge, MD  07/02/2020 10:51 AM    [facility] [address] [place], [place], [place]  [address] Phone: [phone]; Fax: [phone]

## 2020-07-01 DIAGNOSIS — E1122 Type 2 diabetes mellitus with diabetic chronic kidney disease: Secondary | ICD-10-CM | POA: Diagnosis not present

## 2020-07-01 DIAGNOSIS — I10 Essential (primary) hypertension: Secondary | ICD-10-CM | POA: Diagnosis not present

## 2020-07-01 DIAGNOSIS — N1832 Chronic kidney disease, stage 3b: Secondary | ICD-10-CM | POA: Diagnosis not present

## 2020-07-01 DIAGNOSIS — I358 Other nonrheumatic aortic valve disorders: Secondary | ICD-10-CM | POA: Diagnosis not present

## 2020-07-02 ENCOUNTER — Ambulatory Visit (HOSPITAL_COMMUNITY): Payer: PPO | Attending: Cardiovascular Disease

## 2020-07-02 ENCOUNTER — Ambulatory Visit: Payer: PPO | Admitting: Cardiovascular Disease

## 2020-07-02 ENCOUNTER — Encounter: Payer: Self-pay | Admitting: Cardiovascular Disease

## 2020-07-02 ENCOUNTER — Other Ambulatory Visit: Payer: Self-pay

## 2020-07-02 VITALS — BP 144/80 | HR 54 | Ht 67.0 in | Wt 194.6 lb

## 2020-07-02 DIAGNOSIS — I350 Nonrheumatic aortic (valve) stenosis: Secondary | ICD-10-CM | POA: Diagnosis not present

## 2020-07-02 DIAGNOSIS — R011 Cardiac murmur, unspecified: Secondary | ICD-10-CM | POA: Insufficient documentation

## 2020-07-02 DIAGNOSIS — I2510 Atherosclerotic heart disease of native coronary artery without angina pectoris: Secondary | ICD-10-CM

## 2020-07-02 LAB — ECHOCARDIOGRAM COMPLETE
AR max vel: 0.91 cm2
AV Area VTI: 0.87 cm2
AV Area mean vel: 0.9 cm2
AV Mean grad: 29 mmHg
AV Peak grad: 50.4 mmHg
Ao pk vel: 3.55 m/s
Area-P 1/2: 2.59 cm2
S' Lateral: 2.6 cm

## 2020-07-02 MED ORDER — AMLODIPINE BESYLATE 5 MG PO TABS: 5.0000 mg | ORAL_TABLET | Freq: Every day | ORAL | 3 refills | Status: DC

## 2020-07-02 NOTE — Patient Instructions (Addendum)
Medication Instructions:  Your physician has recommended you make the following change in your medication:  1-START amlodipine 5 mg by mouth daily.  *If you need a refill on your cardiac medications before your next appointment, please call your pharmacy*  Lab Work: If you have labs (blood work) drawn today and your tests are completely normal, you will receive your results only by: Marland Kitchen MyChart Message (if you have MyChart) OR . A paper copy in the mail If you have any lab test that is abnormal or we need to change your treatment, we will call you to review the results.  Testing/Procedures: Your physician has requested that you have an echocardiogram in 6 months. Echocardiography is a painless test that uses sound waves to create images of your heart. It provides your doctor with information about the size and shape of your heart and how well your heart's chambers and valves are working. This procedure takes approximately one hour. There are no restrictions for this procedure.  Follow-Up: At [facility], you and your health needs are our priority.  As part of our continuing mission to provide you with exceptional heart care, we have created designated Provider Care Teams.  These Care Teams include your primary Cardiologist (physician) and Advanced Practice Providers (APPs -  Physician Assistants and Nurse Practitioners) who all work together to provide you with the care you need, when you need it.  We recommend signing up for the patient portal called "MyChart".  Sign up information is provided on this After Visit Summary.  MyChart is used to connect with patients for Virtual Visits (Telemedicine).  Patients are able to view lab/test results, encounter notes, upcoming appointments, etc.  Non-urgent messages can be sent to your provider as well.   To learn more about what you can do with MyChart, go to [email or web address].    Your next appointment:   6 month(s)  The format for your next  appointment:   In Person  Provider:   You may see Jenkins Rouge, MD or one of the following Advanced Practice Providers on your designated Care Team:    Truitt Merle, NP  Cecilie Kicks, NP  Kathyrn Drown, NP

## 2020-07-07 ENCOUNTER — Other Ambulatory Visit: Payer: Self-pay | Admitting: Neurology

## 2020-07-15 DIAGNOSIS — N1832 Chronic kidney disease, stage 3b: Secondary | ICD-10-CM | POA: Diagnosis not present

## 2020-07-15 DIAGNOSIS — E1122 Type 2 diabetes mellitus with diabetic chronic kidney disease: Secondary | ICD-10-CM | POA: Diagnosis not present

## 2020-08-12 DIAGNOSIS — I10 Essential (primary) hypertension: Secondary | ICD-10-CM | POA: Diagnosis not present

## 2020-08-12 DIAGNOSIS — E1165 Type 2 diabetes mellitus with hyperglycemia: Secondary | ICD-10-CM | POA: Diagnosis not present

## 2020-08-12 DIAGNOSIS — E1121 Type 2 diabetes mellitus with diabetic nephropathy: Secondary | ICD-10-CM | POA: Diagnosis not present

## 2020-08-12 DIAGNOSIS — N182 Chronic kidney disease, stage 2 (mild): Secondary | ICD-10-CM | POA: Diagnosis not present

## 2020-08-12 DIAGNOSIS — E1140 Type 2 diabetes mellitus with diabetic neuropathy, unspecified: Secondary | ICD-10-CM | POA: Diagnosis not present

## 2020-08-12 DIAGNOSIS — N183 Chronic kidney disease, stage 3 unspecified: Secondary | ICD-10-CM | POA: Diagnosis not present

## 2020-08-12 DIAGNOSIS — E785 Hyperlipidemia, unspecified: Secondary | ICD-10-CM | POA: Diagnosis not present

## 2020-08-12 DIAGNOSIS — E0840 Diabetes mellitus due to underlying condition with diabetic neuropathy, unspecified: Secondary | ICD-10-CM | POA: Diagnosis not present

## 2020-08-12 DIAGNOSIS — E7800 Pure hypercholesterolemia, unspecified: Secondary | ICD-10-CM | POA: Diagnosis not present

## 2020-08-12 DIAGNOSIS — E1129 Type 2 diabetes mellitus with other diabetic kidney complication: Secondary | ICD-10-CM | POA: Diagnosis not present

## 2020-08-12 DIAGNOSIS — E039 Hypothyroidism, unspecified: Secondary | ICD-10-CM | POA: Diagnosis not present

## 2020-08-18 ENCOUNTER — Other Ambulatory Visit: Payer: Self-pay | Admitting: Neurology

## 2020-08-23 DIAGNOSIS — L218 Other seborrheic dermatitis: Secondary | ICD-10-CM | POA: Diagnosis not present

## 2020-08-23 DIAGNOSIS — Z85828 Personal history of other malignant neoplasm of skin: Secondary | ICD-10-CM | POA: Diagnosis not present

## 2020-08-23 DIAGNOSIS — L718 Other rosacea: Secondary | ICD-10-CM | POA: Diagnosis not present

## 2020-08-23 DIAGNOSIS — L814 Other melanin hyperpigmentation: Secondary | ICD-10-CM | POA: Diagnosis not present

## 2020-08-23 DIAGNOSIS — D229 Melanocytic nevi, unspecified: Secondary | ICD-10-CM | POA: Diagnosis not present

## 2020-08-23 DIAGNOSIS — L819 Disorder of pigmentation, unspecified: Secondary | ICD-10-CM | POA: Diagnosis not present

## 2020-08-23 DIAGNOSIS — L821 Other seborrheic keratosis: Secondary | ICD-10-CM | POA: Diagnosis not present

## 2020-08-23 DIAGNOSIS — L905 Scar conditions and fibrosis of skin: Secondary | ICD-10-CM | POA: Diagnosis not present

## 2020-08-23 DIAGNOSIS — L570 Actinic keratosis: Secondary | ICD-10-CM | POA: Diagnosis not present

## 2020-08-23 DIAGNOSIS — D1801 Hemangioma of skin and subcutaneous tissue: Secondary | ICD-10-CM | POA: Diagnosis not present

## 2020-09-02 DIAGNOSIS — R011 Cardiac murmur, unspecified: Secondary | ICD-10-CM | POA: Diagnosis not present

## 2020-09-02 DIAGNOSIS — E1122 Type 2 diabetes mellitus with diabetic chronic kidney disease: Secondary | ICD-10-CM | POA: Diagnosis not present

## 2020-09-02 DIAGNOSIS — N179 Acute kidney failure, unspecified: Secondary | ICD-10-CM | POA: Diagnosis not present

## 2020-09-02 DIAGNOSIS — N1832 Chronic kidney disease, stage 3b: Secondary | ICD-10-CM | POA: Diagnosis not present

## 2020-09-02 DIAGNOSIS — I10 Essential (primary) hypertension: Secondary | ICD-10-CM | POA: Diagnosis not present

## 2020-09-02 DIAGNOSIS — E1140 Type 2 diabetes mellitus with diabetic neuropathy, unspecified: Secondary | ICD-10-CM | POA: Diagnosis not present

## 2020-10-07 DIAGNOSIS — I10 Essential (primary) hypertension: Secondary | ICD-10-CM | POA: Diagnosis not present

## 2020-10-07 DIAGNOSIS — N1832 Chronic kidney disease, stage 3b: Secondary | ICD-10-CM | POA: Diagnosis not present

## 2020-10-07 DIAGNOSIS — I350 Nonrheumatic aortic (valve) stenosis: Secondary | ICD-10-CM | POA: Diagnosis not present

## 2020-10-07 DIAGNOSIS — Z7984 Long term (current) use of oral hypoglycemic drugs: Secondary | ICD-10-CM | POA: Diagnosis not present

## 2020-10-07 DIAGNOSIS — E039 Hypothyroidism, unspecified: Secondary | ICD-10-CM | POA: Diagnosis not present

## 2020-10-07 DIAGNOSIS — E7800 Pure hypercholesterolemia, unspecified: Secondary | ICD-10-CM | POA: Diagnosis not present

## 2020-10-07 DIAGNOSIS — E1122 Type 2 diabetes mellitus with diabetic chronic kidney disease: Secondary | ICD-10-CM | POA: Diagnosis not present

## 2020-10-07 DIAGNOSIS — I739 Peripheral vascular disease, unspecified: Secondary | ICD-10-CM | POA: Diagnosis not present

## 2020-10-07 DIAGNOSIS — E1140 Type 2 diabetes mellitus with diabetic neuropathy, unspecified: Secondary | ICD-10-CM | POA: Diagnosis not present

## 2020-10-15 ENCOUNTER — Other Ambulatory Visit: Payer: Self-pay | Admitting: Internal Medicine

## 2020-10-15 DIAGNOSIS — I739 Peripheral vascular disease, unspecified: Secondary | ICD-10-CM

## 2020-10-23 ENCOUNTER — Ambulatory Visit
Admission: RE | Admit: 2020-10-23 | Discharge: 2020-10-23 | Disposition: A | Discharge status: Home / Self Care | Payer: PPO | Source: Ambulatory Visit | Attending: Internal Medicine | Admitting: Internal Medicine

## 2020-10-23 DIAGNOSIS — I739 Peripheral vascular disease, unspecified: Secondary | ICD-10-CM

## 2020-10-23 DIAGNOSIS — R202 Paresthesia of skin: Secondary | ICD-10-CM | POA: Diagnosis not present

## 2020-10-28 DIAGNOSIS — U071 COVID-19: Secondary | ICD-10-CM | POA: Diagnosis not present

## 2020-10-28 DIAGNOSIS — Z20822 Contact with and (suspected) exposure to covid-19: Secondary | ICD-10-CM | POA: Diagnosis not present

## 2020-12-10 DIAGNOSIS — L905 Scar conditions and fibrosis of skin: Secondary | ICD-10-CM | POA: Diagnosis not present

## 2020-12-10 DIAGNOSIS — Z85828 Personal history of other malignant neoplasm of skin: Secondary | ICD-10-CM | POA: Diagnosis not present

## 2020-12-10 DIAGNOSIS — L570 Actinic keratosis: Secondary | ICD-10-CM | POA: Diagnosis not present

## 2021-01-08 NOTE — Progress Notes (Signed)
Cardiology Office Note   Date:  01/14/2021   ID:  Chad [name], DOB Jun 23, 1946, MRN [id number]  PCP:  Seward Carol, MD  Cardiologist:   Jenkins Rouge, MD   No chief complaint on file.     History of Present Illness:  75 y.o. with DM, HTN, HLD, hypothyroidism and polyneuropathy Seen in f/u for AS  Echo 06/30/19  EF 60-65% mild AS mean gradient 19 mmHg peak 35 mmHg Echo 12/7/21reviewed progression of AS to moderate mean gradient 29 mmHg peak 50 mmHg  And DVI 0.28  Echo 01/14/2021 mean gradient 31 mmHg peak 56.9 mmHg DVI 0.28   Normal ETT 09/03/17 maximum HR was only 110 73% PMHR Calcium score 08/27/17  795 89 st percentile dense in LAD he is on statin  Myovue Lexiscan normal 01/31/20 no ischemia EF 60%   Started on Pamelor for depression    Friends with [name] one of my patients Met at [place] had CRF with Cr as high as 1.8 ACE held   Denies dyspnea, chest pain or syncope   Past Medical History:  Diagnosis Date   CKD (chronic kidney disease)    Colon polyps    Diabetes ([place])    Diabetic polyneuropathy associated with type 2 diabetes mellitus ([place]) 01/15/2017   GERD (gastroesophageal reflux disease)    Hypercholesterolemia    Hypertension    Hypothyroidism    Polyneuropathy    Proteinuria     No past surgical history on file.   Current Outpatient Medications  Medication Sig Dispense Refill   amLODipine (NORVASC) 5 MG tablet Take 1 tablet (5 mg total) by mouth daily. 90 tablet 3   Calcium Carb-Cholecalciferol (CALCIUM 500 + D) 500-125 MG-UNIT TABS Take 1 tablet by mouth daily.     gabapentin (NEURONTIN) 300 MG capsule TAKE ONE CAPSULE BY MOUTH EVERY MORNING , TAKE ONE CAPSULE BY MOUTH DAILY IN THE AFTERNOON, AND TAKE TWO CAPSULES BY MOUTH EVERY NIGHT AT BEDTIME 360 capsule 1   insulin glargine (LANTUS) 100 UNIT/ML injection Inject 10 Units into the skin daily.     JARDIANCE 10 MG TABS tablet Take 10 mg by mouth daily.     levothyroxine (SYNTHROID,  LEVOTHROID) 75 MCG tablet Take 75 mcg by mouth daily before breakfast.      metoprolol succinate (TOPROL-XL) 25 MG 24 hr tablet TAKE ONE TABLET BY MOUTH DAILY 90 tablet 3   nortriptyline (PAMELOR) 10 MG capsule TAKE THREE CAPSULES BY MOUTH EVERY NIGHT AT BEDTIME 270 capsule 3   rosuvastatin (CRESTOR) 40 MG tablet Take 1 tablet by mouth daily.     No current facility-administered medications for this visit.    Allergies:   Patient has no known allergies.    Social History:  The patient  reports that he has quit smoking. He has never used smokeless tobacco. He reports current alcohol use. He reports that he does not use drugs.   Family History:  The patient's family history is not on file.    ROS:  Please see the history of present illness.   Otherwise, review of systems are positive for none.   All other systems are reviewed and negative.    PHYSICAL EXAM: VS:  BP 138/70   Pulse (!) 54   Ht '5\' 8"'  (1.727 m)   Wt 83.1 kg   SpO2 98%   BMI 27.86 kg/m  , BMI Body mass index is 27.86 kg/m. Affect appropriate Healthy:  appears stated age 39:  normal Neck supple with no adenopathy JVP normal no bruits no thyromegaly Lungs clear with no wheezing and good diaphragmatic motion Heart:  S1/S2 AS  murmur, no rub, gallop or click PMI normal Abdomen: benighn, BS positve, no tenderness, no AAA no bruit.  No HSM or HJR Distal pulses intact with no bruits No edema Neuro sensory neuropathy in arms feet  Skin warm and dry No muscular weakness   EKG:  03/28/19 SR rate 77 QT 378    Recent Labs: No results found for requested labs within last 8760 hours.    Lipid Panel No results found for: CHOL, TRIG, HDL, CHOLHDL, VLDL, LDLCALC, LDLDIRECT    Wt Readings from Last 3 Encounters:  01/14/21 83.1 kg  07/02/20 88.3 kg  04/26/20 91.2 kg      Other studies Reviewed: Additional studies/ records that were reviewed today include: Office notes neurology and primary .    ASSESSMENT  AND PLAN:  1.  HTN: on Norvasc ACE held due to renal issues  2. DM Discussed low carb diet.  Target hemoglobin A1c is 6.5 or less.  Continue current medications. A1c 6.2 3. HLD on statin labs with primary  4. Neuropathy f/u neuro Dr Posey Pronto severe sensorimotor polyneuropathy Rx with gabapentin and nortriptyline  5. Thyroid on replacement TSH normal  6. CAD:  High calcium score 08/27/17 795 81 st percentile dense in LAD Normal lexiscan myovue 01/31/20 stable  7. AS:  TTE 01/14/2021 with mean gradient 31 mmHg peak 56.9 mmHg DVI 0.28 again some progression Discussed likely need for TAVR in next 2 years f/u with me with echo 6 months     Current medicines are reviewed at length with the patient today.  The patient does not have concerns regarding medicines.  The following changes have been made:  no change  Labs/ tests ordered today include:   Echo for AS  December 2022    Disposition:   FU with cardiology  6 months     Signed, Jenkins Rouge, MD  01/14/2021 3:40 PM    [facility] [place], [place], [place]  [address] Phone: [phone]; Fax: [phone]

## 2021-01-14 ENCOUNTER — Ambulatory Visit (HOSPITAL_COMMUNITY): Payer: PPO | Attending: Cardiovascular Disease

## 2021-01-14 ENCOUNTER — Encounter: Payer: Self-pay | Admitting: Cardiovascular Disease

## 2021-01-14 ENCOUNTER — Ambulatory Visit: Payer: PPO | Admitting: Cardiovascular Disease

## 2021-01-14 ENCOUNTER — Other Ambulatory Visit: Payer: Self-pay

## 2021-01-14 VITALS — BP 138/70 | HR 54 | Ht 68.0 in | Wt 183.2 lb

## 2021-01-14 DIAGNOSIS — E782 Mixed hyperlipidemia: Secondary | ICD-10-CM | POA: Diagnosis not present

## 2021-01-14 DIAGNOSIS — I10 Essential (primary) hypertension: Secondary | ICD-10-CM | POA: Diagnosis not present

## 2021-01-14 DIAGNOSIS — I350 Nonrheumatic aortic (valve) stenosis: Secondary | ICD-10-CM

## 2021-01-14 DIAGNOSIS — I2510 Atherosclerotic heart disease of native coronary artery without angina pectoris: Secondary | ICD-10-CM | POA: Insufficient documentation

## 2021-01-14 LAB — ECHOCARDIOGRAM COMPLETE
AR max vel: 0.87 cm2
AV Area VTI: 0.88 cm2
AV Area mean vel: 0.9 cm2
AV Mean grad: 31 mmHg
AV Peak grad: 56.9 mmHg
Ao pk vel: 3.77 m/s
Area-P 1/2: 2.36 cm2
S' Lateral: 2.6 cm

## 2021-01-14 NOTE — Patient Instructions (Signed)
Medication Instructions:  *If you need a refill on your cardiac medications before your next appointment, please call your pharmacy*  Lab Work: If you have labs (blood work) drawn today and your tests are completely normal, you will receive your results only by: [place] (if you have MyChart) OR A paper copy in the mail If you have any lab test that is abnormal or we need to change your treatment, we will call you to review the results.  Testing/Procedures: Your physician has requested that you have an echocardiogram in 6 months at same day as office visit. Echocardiography is a painless test that uses sound waves to create images of your heart. It provides your doctor with information about the size and shape of your heart and how well your heart's chambers and valves are working. This procedure takes approximately one hour. There are no restrictions for this procedure.  Follow-Up: At [facility], you and your health needs are our priority.  As part of our continuing mission to provide you with exceptional heart care, we have created designated Provider Care Teams.  These Care Teams include your primary Cardiologist (physician) and Advanced Practice Providers (APPs -  Physician Assistants and Nurse Practitioners) who all work together to provide you with the care you need, when you need it.  We recommend signing up for the patient portal called "MyChart".  Sign up information is provided on this After Visit Summary.  MyChart is used to connect with patients for Virtual Visits (Telemedicine).  Patients are able to view lab/test results, encounter notes, upcoming appointments, etc.  Non-urgent messages can be sent to your provider as well.   To learn more about what you can do with MyChart, go to [email or web address].    Your next appointment:   6 month(s)  The format for your next appointment:   In Person  Provider:   You may see Jenkins Rouge, MD or one of the following Advanced  Practice Providers on your designated Care Team:   Kathyrn Drown, NP

## 2021-01-22 DIAGNOSIS — N184 Chronic kidney disease, stage 4 (severe): Secondary | ICD-10-CM | POA: Diagnosis not present

## 2021-01-22 DIAGNOSIS — E7800 Pure hypercholesterolemia, unspecified: Secondary | ICD-10-CM | POA: Diagnosis not present

## 2021-01-22 DIAGNOSIS — E1122 Type 2 diabetes mellitus with diabetic chronic kidney disease: Secondary | ICD-10-CM | POA: Diagnosis not present

## 2021-01-22 DIAGNOSIS — E1165 Type 2 diabetes mellitus with hyperglycemia: Secondary | ICD-10-CM | POA: Diagnosis not present

## 2021-01-22 DIAGNOSIS — E1129 Type 2 diabetes mellitus with other diabetic kidney complication: Secondary | ICD-10-CM | POA: Diagnosis not present

## 2021-01-22 DIAGNOSIS — E785 Hyperlipidemia, unspecified: Secondary | ICD-10-CM | POA: Diagnosis not present

## 2021-01-22 DIAGNOSIS — E1140 Type 2 diabetes mellitus with diabetic neuropathy, unspecified: Secondary | ICD-10-CM | POA: Diagnosis not present

## 2021-01-22 DIAGNOSIS — E039 Hypothyroidism, unspecified: Secondary | ICD-10-CM | POA: Diagnosis not present

## 2021-01-22 DIAGNOSIS — N183 Chronic kidney disease, stage 3 unspecified: Secondary | ICD-10-CM | POA: Diagnosis not present

## 2021-01-22 DIAGNOSIS — E1121 Type 2 diabetes mellitus with diabetic nephropathy: Secondary | ICD-10-CM | POA: Diagnosis not present

## 2021-01-22 DIAGNOSIS — N182 Chronic kidney disease, stage 2 (mild): Secondary | ICD-10-CM | POA: Diagnosis not present

## 2021-01-22 DIAGNOSIS — I10 Essential (primary) hypertension: Secondary | ICD-10-CM | POA: Diagnosis not present

## 2021-01-23 DIAGNOSIS — E113293 Type 2 diabetes mellitus with mild nonproliferative diabetic retinopathy without macular edema, bilateral: Secondary | ICD-10-CM | POA: Diagnosis not present

## 2021-01-23 DIAGNOSIS — H5203 Hypermetropia, bilateral: Secondary | ICD-10-CM | POA: Diagnosis not present

## 2021-02-14 ENCOUNTER — Other Ambulatory Visit: Payer: Self-pay | Admitting: Neurology

## 2021-03-03 DIAGNOSIS — I129 Hypertensive chronic kidney disease with stage 1 through stage 4 chronic kidney disease, or unspecified chronic kidney disease: Secondary | ICD-10-CM | POA: Diagnosis not present

## 2021-03-03 DIAGNOSIS — E039 Hypothyroidism, unspecified: Secondary | ICD-10-CM | POA: Diagnosis not present

## 2021-03-03 DIAGNOSIS — E1140 Type 2 diabetes mellitus with diabetic neuropathy, unspecified: Secondary | ICD-10-CM | POA: Diagnosis not present

## 2021-03-03 DIAGNOSIS — N1832 Chronic kidney disease, stage 3b: Secondary | ICD-10-CM | POA: Diagnosis not present

## 2021-03-03 DIAGNOSIS — E1122 Type 2 diabetes mellitus with diabetic chronic kidney disease: Secondary | ICD-10-CM | POA: Diagnosis not present

## 2021-03-05 ENCOUNTER — Telehealth: Payer: Self-pay | Admitting: Cardiovascular Disease

## 2021-03-05 NOTE — Telephone Encounter (Signed)
Pt c/o medication issue:  1. Name of Medication: amLODipine (NORVASC) 5 MG tablet , JARDIANCE 10 MG TABS tablet  2. How are you currently taking this medication (dosage and times per day)? Take 1 tablet (5 mg total) by mouth daily., Take 10 mg by mouth daily.  3. Are you having a reaction (difficulty breathing--STAT)? No   4. What is your medication issue?  Kidney doctor Dr. Joylene Grapes at [facility] is wanting the pt to start taking Losartan and stop Amlodopine, pt is taking this along with Jardiance and would like to inform.

## 2021-03-05 NOTE — Telephone Encounter (Signed)
Patient wanted to let  Dr. Johnsie Cancel know and see if he is okay with Dr. Joylene Grapes, nephrologist, changing patient from amlodipine 5 mg to Losartan 25 mg daily. Will forward to Dr. Johnsie Cancel.

## 2021-03-05 NOTE — Telephone Encounter (Signed)
Per Dr. Johnsie Cancel, that is fine. Called patient with response. Patient verbalized understanding.

## 2021-03-12 ENCOUNTER — Other Ambulatory Visit: Payer: Self-pay | Admitting: Cardiovascular Disease

## 2021-03-17 DIAGNOSIS — E1122 Type 2 diabetes mellitus with diabetic chronic kidney disease: Secondary | ICD-10-CM | POA: Diagnosis not present

## 2021-03-17 DIAGNOSIS — E785 Hyperlipidemia, unspecified: Secondary | ICD-10-CM | POA: Diagnosis not present

## 2021-03-17 DIAGNOSIS — E11319 Type 2 diabetes mellitus with unspecified diabetic retinopathy without macular edema: Secondary | ICD-10-CM | POA: Diagnosis not present

## 2021-03-17 DIAGNOSIS — E1140 Type 2 diabetes mellitus with diabetic neuropathy, unspecified: Secondary | ICD-10-CM | POA: Diagnosis not present

## 2021-03-17 DIAGNOSIS — I10 Essential (primary) hypertension: Secondary | ICD-10-CM | POA: Diagnosis not present

## 2021-03-17 DIAGNOSIS — E039 Hypothyroidism, unspecified: Secondary | ICD-10-CM | POA: Diagnosis not present

## 2021-03-17 DIAGNOSIS — E1165 Type 2 diabetes mellitus with hyperglycemia: Secondary | ICD-10-CM | POA: Diagnosis not present

## 2021-03-17 DIAGNOSIS — E1121 Type 2 diabetes mellitus with diabetic nephropathy: Secondary | ICD-10-CM | POA: Diagnosis not present

## 2021-03-17 DIAGNOSIS — N184 Chronic kidney disease, stage 4 (severe): Secondary | ICD-10-CM | POA: Diagnosis not present

## 2021-03-17 DIAGNOSIS — E7800 Pure hypercholesterolemia, unspecified: Secondary | ICD-10-CM | POA: Diagnosis not present

## 2021-03-17 DIAGNOSIS — E0840 Diabetes mellitus due to underlying condition with diabetic neuropathy, unspecified: Secondary | ICD-10-CM | POA: Diagnosis not present

## 2021-03-17 DIAGNOSIS — E1129 Type 2 diabetes mellitus with other diabetic kidney complication: Secondary | ICD-10-CM | POA: Diagnosis not present

## 2021-03-20 DIAGNOSIS — N1832 Chronic kidney disease, stage 3b: Secondary | ICD-10-CM | POA: Diagnosis not present

## 2021-04-15 DIAGNOSIS — L814 Other melanin hyperpigmentation: Secondary | ICD-10-CM | POA: Diagnosis not present

## 2021-04-15 DIAGNOSIS — L738 Other specified follicular disorders: Secondary | ICD-10-CM | POA: Diagnosis not present

## 2021-04-20 ENCOUNTER — Other Ambulatory Visit: Payer: Self-pay | Admitting: Neurology

## 2021-04-28 ENCOUNTER — Other Ambulatory Visit (INDEPENDENT_AMBULATORY_CARE_PROVIDER_SITE_OTHER): Payer: PPO

## 2021-04-28 ENCOUNTER — Ambulatory Visit: Payer: PPO | Admitting: Neurology

## 2021-04-28 ENCOUNTER — Encounter: Payer: Self-pay | Admitting: Neurology

## 2021-04-28 ENCOUNTER — Other Ambulatory Visit: Payer: Self-pay

## 2021-04-28 VITALS — BP 153/68 | HR 49 | Ht 70.0 in | Wt 186.4 lb

## 2021-04-28 DIAGNOSIS — D472 Monoclonal gammopathy: Secondary | ICD-10-CM | POA: Diagnosis not present

## 2021-04-28 DIAGNOSIS — E1142 Type 2 diabetes mellitus with diabetic polyneuropathy: Secondary | ICD-10-CM

## 2021-04-28 NOTE — Progress Notes (Signed)
Follow-up Visit   Date: 04/28/21    Chad [name] MRN: [id number] DOB: 1946-06-26   [name] is a 75 y.o. right-handed Caucasian male with well-controlled diabetes mellitus, hypertension, hypothyroidism, and hyperlipidemia returning to the clinic for follow-up of neuropathy.    History of present illness: Initial visit 12/2016:  Starting around 2016, he starting having pins and needle sensation over the soles and toes.  Symptoms are constant and worse with walking. He walks unassisted and has not had any falls. He has NCS/EMG of the legs performed by Dr. Domingo Cocking in June 2017 which showed severe sensorimotor polyneuropathy.  He has previously tried gabapentin, but does not recall the dose.  He takes Lyrica 200mg  twice daily and denies any side effects. He was diagnosed with diabetes in 2008 and has been managed with oral medications.  His last HbA1c 6.2.  He has also tried accupuncture.     UPDATE 04/26/2020:    His neuropathy has largely been stable and well-controlled on gabapentin 300mg  TID and nortriptyline 30mg  at bedtime. At his last visit, I increased nortriptyline back to 30mg  due to worsening paresthesias, which has helped.  He did not experience constipation with medication change.  He continues to have constant low grade tingling, but it is manageable.    He is now on insulin and tolerating this well, last HbA1c ~ 7.  UPDATE 04/28/2021:  He is here for 1 year follow-up visit.  Neuropathy remains stable and restricted to the feet.  No weakness.   He takes gabapentin 300mg  TID and nortriptyline 30mg  at bedtime.  He no longer hand any pain, only numbness and tingling. He had one mechanical fall while walking hand-in-hand with his wife and she tripped, pulling him down.  Fortunately, they did not injure themselves.  Last HbA1c 5.9.  Medications:  Current Outpatient Medications on File Prior to Visit  Medication Sig Dispense Refill   Calcium Carb-Cholecalciferol  (CALCIUM 500 + D) 500-125 MG-UNIT TABS Take 1 tablet by mouth daily.     gabapentin (NEURONTIN) 300 MG capsule TAKE 1 CAPSULE BY MOUTH EVERY MORNING, TAKE 1 CAPSULE BY MOUTH DAILY IN THE AFTERNOON, AND TAKE 2 CAPSULES BY MOUTH AT BEDTIME 120 capsule 1   insulin glargine (LANTUS) 100 UNIT/ML injection Inject 10 Units into the skin daily.     JARDIANCE 10 MG TABS tablet Take 10 mg by mouth daily.     levothyroxine (SYNTHROID, LEVOTHROID) 75 MCG tablet Take 75 mcg by mouth daily before breakfast.      losartan (COZAAR) 25 MG tablet Take 25 mg by mouth daily.     metoprolol succinate (TOPROL-XL) 25 MG 24 hr tablet TAKE ONE TABLET BY MOUTH DAILY 90 tablet 3   nortriptyline (PAMELOR) 10 MG capsule TAKE THREE CAPSULES BY MOUTH EVERY NIGHT AT BEDTIME 270 capsule 3   rosuvastatin (CRESTOR) 40 MG tablet Take 1 tablet by mouth daily.     No current facility-administered medications on file prior to visit.    Allergies: No Known Allergies   Vital Signs:  There were no vitals taken for this visit.   Neurological Exam: MENTAL STATUS including orientation to time, place, person, recent and remote memory, attention span and concentration, language, and fund of knowledge is normal.  Speech is not dysarthric.  CRANIAL NERVES:  Pupils equal round and reactive to light.  Normal conjugate, extra-ocular eye movements in all directions of gaze.      MOTOR:  Motor strength is 5/5 in  all extremities.  No pronator drift.  Tone is normal.    MSRs:  Reflexes are 2+/4 throughout, except absent Achilles.  SENSORY:  Reduced vibration distal to ankles bilaterally.  Mildly reduced pin prick and light touch in the feet.   COORDINATION/GAIT: Gait appears stable, stooped, unassisted.   DATA: NCS/EMG of the legs 01/09/2016 performed elsewhere:  Severe sensory and motor polyneuropathy with chronic denervation consistent with patient's known history of diabetes mellitus.   Labs 12/2016:  vitamin B12 298, copper 95, SPEP  with IFE A poorly-defined area of restricted protein mobility is detected and is reactive with IgG and Kappa antisera, folate >24.0    IMPRESSION/PLAN: 1.  Peripheral neuropathy contributed by diabetes, diabetes is very well-controlled with last HbA1c 5.9.  Clinically, stable.  - Previously tried:  Lyrica  - Continue gabapentin 300mg  TID and nortriptyline 30mg  at bedtime  2. Faint IgG kappa monoclonal protein, no M protein  - Check SPEP with IFE  Return to clinic in 1 year   Thank you for allowing me to participate in patient's care.  If I can answer any additional questions, I would be pleased to do so.    Sincerely,    Valori Hollenkamp K. Posey Pronto, DO

## 2021-04-28 NOTE — Patient Instructions (Addendum)
Your provider has requested that you have labwork completed today. The lab is located on the Second floor at [place], within the [facility] office. When you get off the elevator, turn right and go in the [facility] [address]; the first brown door on the left.  Tell the ladies behind the desk that you are there for lab work. If you are not called within 15 minutes please check with the front desk.   Once you complete your labs you are free to go. You will receive a call or message via MyChart with your lab results.    Check labs  Return to clinic in 1 year

## 2021-05-01 LAB — PROTEIN ELECTROPHORESIS, SERUM
Albumin ELP: 3.9 g/dL (ref 3.8–4.8)
Alpha 1: 0.4 g/dL — ABNORMAL HIGH (ref 0.2–0.3)
Alpha 2: 0.9 g/dL (ref 0.5–0.9)
Beta 2: 0.3 g/dL (ref 0.2–0.5)
Beta Globulin: 0.4 g/dL (ref 0.4–0.6)
Gamma Globulin: 0.8 g/dL (ref 0.8–1.7)
Total Protein: 6.7 g/dL (ref 6.1–8.1)

## 2021-05-01 LAB — IMMUNOFIXATION ELECTROPHORESIS
IgG (Immunoglobin G), Serum: 811 mg/dL (ref 600–1540)
IgM, Serum: 70 mg/dL (ref 50–300)
Immunofix Electr Int: NOT DETECTED
Immunoglobulin A: 141 mg/dL (ref 70–320)

## 2021-05-27 DIAGNOSIS — E113293 Type 2 diabetes mellitus with mild nonproliferative diabetic retinopathy without macular edema, bilateral: Secondary | ICD-10-CM | POA: Diagnosis not present

## 2021-05-27 DIAGNOSIS — N1832 Chronic kidney disease, stage 3b: Secondary | ICD-10-CM | POA: Diagnosis not present

## 2021-06-03 DIAGNOSIS — I129 Hypertensive chronic kidney disease with stage 1 through stage 4 chronic kidney disease, or unspecified chronic kidney disease: Secondary | ICD-10-CM | POA: Diagnosis not present

## 2021-06-03 DIAGNOSIS — N1832 Chronic kidney disease, stage 3b: Secondary | ICD-10-CM | POA: Diagnosis not present

## 2021-06-03 DIAGNOSIS — R011 Cardiac murmur, unspecified: Secondary | ICD-10-CM | POA: Diagnosis not present

## 2021-06-03 DIAGNOSIS — E1140 Type 2 diabetes mellitus with diabetic neuropathy, unspecified: Secondary | ICD-10-CM | POA: Diagnosis not present

## 2021-06-03 DIAGNOSIS — E039 Hypothyroidism, unspecified: Secondary | ICD-10-CM | POA: Diagnosis not present

## 2021-06-03 DIAGNOSIS — E1122 Type 2 diabetes mellitus with diabetic chronic kidney disease: Secondary | ICD-10-CM | POA: Diagnosis not present

## 2021-06-09 ENCOUNTER — Ambulatory Visit (INDEPENDENT_AMBULATORY_CARE_PROVIDER_SITE_OTHER): Payer: PPO | Admitting: Ophthalmology

## 2021-06-09 ENCOUNTER — Encounter (INDEPENDENT_AMBULATORY_CARE_PROVIDER_SITE_OTHER): Payer: Self-pay | Admitting: Ophthalmology

## 2021-06-09 VITALS — BP 154/81

## 2021-06-09 DIAGNOSIS — H35033 Hypertensive retinopathy, bilateral: Secondary | ICD-10-CM

## 2021-06-09 DIAGNOSIS — E113313 Type 2 diabetes mellitus with moderate nonproliferative diabetic retinopathy with macular edema, bilateral: Secondary | ICD-10-CM | POA: Diagnosis not present

## 2021-06-09 DIAGNOSIS — H35012 Changes in retinal vascular appearance, left eye: Secondary | ICD-10-CM | POA: Diagnosis not present

## 2021-06-09 DIAGNOSIS — H25813 Combined forms of age-related cataract, bilateral: Secondary | ICD-10-CM

## 2021-06-09 DIAGNOSIS — H3581 Retinal edema: Secondary | ICD-10-CM

## 2021-06-09 DIAGNOSIS — I10 Essential (primary) hypertension: Secondary | ICD-10-CM

## 2021-06-09 MED ORDER — BEVACIZUMAB CHEMO INJECTION 1.25MG/0.05ML SYRINGE FOR KALEIDOSCOPE
1.2500 mg | INTRAVITREAL | Status: AC | PRN
  Administered 2021-06-09: 1.25 mg via INTRAVITREAL

## 2021-06-09 NOTE — Progress Notes (Signed)
[facility] Note  06/09/2021     CHIEF COMPLAINT Patient presents for Retina Evaluation   HISTORY OF PRESENT ILLNESS: Chad [name] is a 75 y.o. male who presents to the clinic today for:   HPI     Retina Evaluation   In both eyes.  Associated Symptoms Floaters.  I, the attending physician,  performed the HPI with the patient and updated documentation appropriately.        Comments   Retina eval per Dr. Delman Cheadle for Diabetic Retinopathy- On occasion he will have very small floaters that look like hair.  He wears readers. IDDM x15 years (just started insulin 1-1 1/2 years)  BS 120 this am, A1C 5.7      Last edited by Bernarda Caffey, MD on 06/09/2021 10:13 PM.    Pt is here on the referral of Dr. Delman Cheadle for concern of DR, pt saw Dr. Delman Cheadle 6 months ago and he noticed bleeding in his eye, he saw him again a couple weeks ago and was referred here, pt has been diabetic for about 15 years, he took metofrmin for about 10 years and was started on Lantus about 1.5 years ago, pt states he has also lost 30lbs and watches what he eats, pts last A1c was 5.0, pt has been seeing a kidney specialist for about 2 years and just had an appt with him, he states everything looked good at that appointment, pt also has neuropathy in his feet  Referring physician: Sharyne Peach, MD [address] [place],  [place] [address]  HISTORICAL INFORMATION:   Selected notes from the MEDICAL RECORD NUMBER Referred by Dr. Delman Cheadle for diabetic retinopathy with DME OU [name]:  Ocular Hx- PMH-    CURRENT MEDICATIONS: No current outpatient medications on file. (Ophthalmic Drugs)   No current facility-administered medications for this visit. (Ophthalmic Drugs)   Current Outpatient Medications (Other)  Medication Sig   Calcium Carb-Cholecalciferol (CALCIUM 500 + D) 500-125 MG-UNIT TABS Take 1 tablet by mouth daily.   gabapentin (NEURONTIN) 300 MG capsule TAKE 1 CAPSULE BY MOUTH EVERY MORNING,  TAKE 1 CAPSULE BY MOUTH DAILY IN THE AFTERNOON, AND TAKE 2 CAPSULES BY MOUTH AT BEDTIME   insulin glargine (LANTUS) 100 UNIT/ML injection Inject 10 Units into the skin daily.   JARDIANCE 10 MG TABS tablet Take 10 mg by mouth daily.   levothyroxine (SYNTHROID, LEVOTHROID) 75 MCG tablet Take 75 mcg by mouth daily before breakfast.    losartan (COZAAR) 25 MG tablet Take 25 mg by mouth daily.   metoprolol succinate (TOPROL-XL) 25 MG 24 hr tablet TAKE ONE TABLET BY MOUTH DAILY   nortriptyline (PAMELOR) 10 MG capsule TAKE THREE CAPSULES BY MOUTH EVERY NIGHT AT BEDTIME   rosuvastatin (CRESTOR) 40 MG tablet Take 1 tablet by mouth daily.   No current facility-administered medications for this visit. (Other)   REVIEW OF SYSTEMS: ROS   Positive for: Genitourinary, Endocrine, Eyes, Respiratory Negative for: Constitutional, Gastrointestinal, Neurological, Skin, Musculoskeletal, HENT, Cardiovascular, Psychiatric, Allergic/Imm, Heme/Lymph Last edited by Leonie Douglas, COA on 06/09/2021  9:39 AM.     ALLERGIES No Known Allergies  PAST MEDICAL HISTORY Past Medical History:  Diagnosis Date   CKD (chronic kidney disease)    Colon polyps    Diabetes ([place])    Diabetic polyneuropathy associated with type 2 diabetes mellitus ([place]) 01/15/2017   GERD (gastroesophageal reflux disease)    Hypercholesterolemia    Hypertension    Hypothyroidism    Polyneuropathy  Proteinuria    History reviewed. No pertinent surgical history.  FAMILY HISTORY Family History  Problem Relation Age of Onset   Stroke Father    Glaucoma Father     SOCIAL HISTORY Social History   Tobacco Use   Smoking status: Former   Smokeless tobacco: Never  Scientific laboratory technician Use: Never used  Substance Use Topics   Alcohol use: Yes   Drug use: No       OPHTHALMIC EXAM: Base Eye Exam     Visual Acuity (Snellen - Linear)       Right Left   Dist [place] 20/40 20/50   Dist ph [place] NI 20/40 -2         Tonometry (Tonopen,  9:51 AM)       Right Left   Pressure 17 18         Pupils       Dark Light Shape React APD   Right 3 2 Round Brisk None   Left 3 2 Round Slow None         Visual Fields (Counting fingers)       Left Right    Full Full         Extraocular Movement       Right Left    Full Full         Neuro/Psych     Oriented x3: Yes   Mood/Affect: Normal           Slit Lamp and Fundus Exam     External Exam       Right Left   External Normal Normal         Slit Lamp Exam       Right Left   Lids/Lashes Dermatochalasis - upper lid, mild Telangiectasia Dermatochalasis - upper lid, Meibomian gland dysfunction   Conjunctiva/Sclera White and quiet White and quiet   Cornea 2+ fine, inferior Punctate epithelial erosions 1+ Punctate epithelial erosions, mild tear film debris   Anterior Chamber deep, clear, narrow angles deep, clear, narrow angles   Iris Round and dilated, mild anterior bowing, No NVI Round and dilated, mild anterior bowing, No NVI   Lens 2-3+ Nuclear sclerosis, 2+ Cortical cataract 2-3+ Nuclear sclerosis, 2+ Cortical cataract   Vitreous Vitreous syneresis Vitreous syneresis         Fundus Exam       Right Left   Disc Pink and Sharp, no NVD Pink and Sharp, no NVD   C/D Ratio 0.3 0.3   Macula Blunted foveal reflex, mild ERM, scattered MA and exudate, central edema Flat, Blunted foveal reflex, scattered MA and focal exudate, central edema   Vessels attenuated, Tortuous attenuated, Tortuous, Copper wiring, Macroaneurysm along IT arcades   Periphery Attached, scattered MA/DBH    Attached, scattered MA/DBH              Refraction     Manifest Refraction       Sphere Cylinder Axis Dist VA   Right +0.50 +0.75 015 20/30-   Left [place]   NI            IMAGING AND PROCEDURES  Imaging and Procedures for 06/09/2021  OCT, Retina - OU - Both Eyes       Right Eye Quality was good. Central Foveal Thickness: 450. Progression has no prior data.  Findings include abnormal foveal contour, intraretinal fluid, no SRF, intraretinal hyper-reflective material, macular pucker, epiretinal membrane.   Left Eye Quality was good. Central  Foveal Thickness: 351. Progression has no prior data. Findings include intraretinal fluid, no SRF, intraretinal hyper-reflective material, abnormal foveal contour (Focal IRHM and retinal thickening along IT arcades -- likely macroaneurysm).   Notes *Images captured and stored on drive  Diagnosis / Impression:  +DME OU OS: Focal IRHM and retinal thickening along IT arcades -- +macroaneurysm  Clinical management:  See below  Abbreviations: NFP - Normal foveal profile. CME - cystoid macular edema. PED - pigment epithelial detachment. IRF - intraretinal fluid. SRF - subretinal fluid. EZ - ellipsoid zone. ERM - epiretinal membrane. ORA - outer retinal atrophy. ORT - outer retinal tubulation. SRHM - subretinal hyper-reflective material. IRHM - intraretinal hyper-reflective material      Fluorescein Angiography Optos (Transit OS)       Right Eye Progression has no prior data. Early phase findings include microaneurysm, vascular perfusion defect. Mid/Late phase findings include microaneurysm, vascular perfusion defect, leakage.   Left Eye Progression has no prior data. Early phase findings include microaneurysm, vascular perfusion defect, blockage. Mid/Late phase findings include blockage, leakage, microaneurysm, vascular perfusion defect (No NV, focal blockage along IT arcade corresponding to macroaneurysm).   Notes **Images stored on drive**  Impression: Moderate NPDR OU Vascular perfusion defects OU (OS>OD) No NV OU OS: Focal macroaneurysm along IT arcades      Intravitreal Injection, Pharmacologic Agent - OS - Left Eye       Time Out 06/09/2021. 12:29 PM. Confirmed correct patient, procedure, site, and patient consented.   Anesthesia Topical anesthesia was used. Anesthetic medications  included Lidocaine 2%, Proparacaine 0.5%.   Procedure A supplied needle was used.   Injection: 1.25 mg Bevacizumab 1.25mg /0.75ml   Route: Intravitreal, Site: Left Eye   NDC: 16109-604-54, Lot: [id number] , Expiration date: 07/27/2021, Waste: 0 mL   Post-op Post injection exam found visual acuity of at least counting fingers. The patient tolerated the procedure well. There were no complications. The patient received written and verbal post procedure care education. Post injection medications were not given.            ASSESSMENT/PLAN:    ICD-10-CM   1. Moderate nonproliferative diabetic retinopathy of both eyes with macular edema associated with type 2 diabetes mellitus (HCC)  14/07/2021 Intravitreal Injection, Pharmacologic Agent - OS - Left Eye    Bevacizumab (AVASTIN) SOLN 1.25 mg    2. Retinal edema  H35.81 OCT, Retina - OU - Both Eyes    3. Retinal macroaneurysm of left eye  H35.012     4. Essential hypertension  I10     5. Hypertensive retinopathy of both eyes  H35.033 Fluorescein Angiography Optos (Transit OS)    6. Combined forms of age-related cataract of both eyes  H25.813       1,2. Moderate Non-proliferative diabetic retinopathy w/ DME OU - The incidence, risk factors for progression, natural history and treatment options for diabetic retinopathy were discussed with patient.   - The need for close monitoring of blood glucose, blood pressure, and serum lipids, avoiding cigarette or any type of tobacco, and the need for long term follow up was also discussed with patient. - exam shows scattered MA/DBH OU, +edema and exudates - FA (11.14.22) shows vascular perfusion defects OU, no NV OU, OS with focal macroaneurysm along IT arcades - OCT shows diabetic macular edema, both eyes  - The natural history, pathology, and characteristics of diabetic macular edema discussed with patient.  A generalized discussion of the major clinical trials concerning treatment of diabetic  macular edema (ETDRS,  DCT, SCORE, RISE / RIDE, and ongoing DRCR net studies) was completed.  This discussion included mention of the various approaches to treating diabetic macular edema (observation, laser photocoagulation, anti-VEGF injections with lucentis / Avastin / Eylea, steroid injections with Kenalog / Ozurdex, and intraocular surgery with vitrectomy).  The goal hemoglobin A1C of 6-7 was discussed, as well as importance of smoking cessation and hypertension control.  Need for ongoing treatment and monitoring were specifically discussed with reference to chronic nature of diabetic macular edema. - recommend IVA OU, but OS #1 first today, 11.14.22 - pt wishes to proceed with injection - RBA of procedure discussed, questions answered - IVA informed consent obtained and signed OU, 11.14.22 - see procedure note - f/u in 3 days for IVA OD  3. Retinal macroaneurysm OS  - focal lesion along IT arcades  - s/p IVA OS #1 today, 11.14.22, as above  4,5. Hypertensive retinopathy OU - discussed importance of tight BP control - monitor  6.  Mixed Cataract OU - The symptoms of cataract, surgical options, and treatments and risks were discussed with patient. - discussed diagnosis and progression - approaching visual significance   Ophthalmic Meds Ordered this visit:  Meds ordered this encounter  Medications   Bevacizumab (AVASTIN) SOLN 1.25 mg      Return in about 3 days (around 06/12/2021) for +DME; Possible Injxn.  There are no Patient Instructions on file for this visit.   Explained the diagnoses, plan, and follow up with the patient and they expressed understanding.  Patient expressed understanding of the importance of proper follow up care.   Gardiner Sleeper, M.D., Ph.D. Diseases & Surgery of the Retina and Vitreous [facility]  I have reviewed the above documentation for accuracy and completeness, and I agree with the above. Gardiner Sleeper, M.D., Ph.D.  06/09/21 10:42 PM   Abbreviations: M myopia (nearsighted); A astigmatism; H hyperopia (farsighted); P presbyopia; Mrx spectacle prescription;  CTL contact lenses; OD right eye; OS left eye; OU both eyes  XT exotropia; ET esotropia; PEK punctate epithelial keratitis; PEE punctate epithelial erosions; DES dry eye syndrome; MGD meibomian gland dysfunction; ATs artificial tears; PFAT's preservative free artificial tears; [place] nuclear sclerotic cataract; PSC posterior subcapsular cataract; ERM epi-retinal membrane; PVD posterior vitreous detachment; RD retinal detachment; DM diabetes mellitus; DR diabetic retinopathy; NPDR non-proliferative diabetic retinopathy; PDR proliferative diabetic retinopathy; CSME clinically significant macular edema; DME diabetic macular edema; dbh dot blot hemorrhages; CWS cotton wool spot; POAG primary open angle glaucoma; C/D cup-to-disc ratio; HVF humphrey visual field; GVF goldmann visual field; OCT optical coherence tomography; IOP intraocular pressure; BRVO Branch retinal vein occlusion; CRVO central retinal vein occlusion; CRAO central retinal artery occlusion; BRAO branch retinal artery occlusion; RT retinal tear; SB scleral buckle; PPV pars plana vitrectomy; VH Vitreous hemorrhage; PRP panretinal laser photocoagulation; IVK intravitreal kenalog; VMT vitreomacular traction; MH Macular hole;  NVD neovascularization of the disc; NVE neovascularization elsewhere; AREDS age related eye disease study; ARMD age related macular degeneration; POAG primary open angle glaucoma; EBMD epithelial/anterior basement membrane dystrophy; ACIOL anterior chamber intraocular lens; IOL intraocular lens; PCIOL posterior chamber intraocular lens; Phaco/IOL phacoemulsification with intraocular lens placement; [place] photorefractive keratectomy; LASIK laser assisted in situ keratomileusis; HTN hypertension; DM diabetes mellitus; COPD chronic obstructive pulmonary disease

## 2021-06-10 NOTE — Progress Notes (Signed)
[facility] Note  06/12/2021    CHIEF COMPLAINT Patient presents for Retina Follow Up   HISTORY OF PRESENT ILLNESS: Chad [name] is a 75 y.o. male who presents to the clinic today for:   HPI     Retina Follow Up   Patient presents with  Diabetic Retinopathy.  In both eyes.  This started days ago.  Severity is moderate.  Duration of 3 days.  Since onset it is stable.  I, the attending physician,  performed the HPI with the patient and updated documentation appropriately.        Comments   75 y/o male pt here for 3 day f/u for mod NPDR w/DME OU.  S/p IVA OS #1 11.14.22.  Here for IVA OD #1 today.  No change in [place] OU noticed.  Denies pain, FOL, floaters.      Last edited by Bernarda Caffey, MD on 06/12/2021  9:41 AM.    Pt is here for IVA OD #1 today  Referring physician: Seward Carol, MD [address],  [place] [id number]  HISTORICAL INFORMATION:   Selected notes from the MEDICAL RECORD NUMBER Referred by Dr. Delman Cheadle for diabetic retinopathy with DME OU [name]:  Ocular Hx- PMH-    CURRENT MEDICATIONS: No current outpatient medications on file. (Ophthalmic Drugs)   No current facility-administered medications for this visit. (Ophthalmic Drugs)   Current Outpatient Medications (Other)  Medication Sig   B-D ULTRAFINE III SHORT PEN 31G X 8 MM MISC SMARTSIG:1 Each SUB-Q Daily   Calcium Carb-Cholecalciferol (CALCIUM 500 + D) 500-125 MG-UNIT TABS Take 1 tablet by mouth daily.   FLUZONE HIGH-DOSE QUADRIVALENT 0.7 ML SUSY    gabapentin (NEURONTIN) 300 MG capsule TAKE 1 CAPSULE BY MOUTH EVERY MORNING, TAKE 1 CAPSULE BY MOUTH DAILY IN THE AFTERNOON, AND TAKE 2 CAPSULES BY MOUTH AT BEDTIME   insulin glargine (LANTUS) 100 UNIT/ML injection Inject 10 Units into the skin daily.   JARDIANCE 10 MG TABS tablet Take 10 mg by mouth daily.   levothyroxine (SYNTHROID, LEVOTHROID) 75 MCG tablet Take 75 mcg by mouth daily before breakfast.     losartan (COZAAR) 25 MG tablet Take 25 mg by mouth daily.   metoprolol succinate (TOPROL-XL) 25 MG 24 hr tablet TAKE ONE TABLET BY MOUTH DAILY   nortriptyline (PAMELOR) 10 MG capsule TAKE THREE CAPSULES BY MOUTH EVERY NIGHT AT BEDTIME   ONETOUCH VERIO test strip    rosuvastatin (CRESTOR) 40 MG tablet Take 1 tablet by mouth daily.   No current facility-administered medications for this visit. (Other)   REVIEW OF SYSTEMS: ROS   Positive for: Gastrointestinal, Genitourinary, Endocrine, Eyes Negative for: Constitutional, Neurological, Skin, Musculoskeletal, HENT, Cardiovascular, Respiratory, Psychiatric, Allergic/Imm, Heme/Lymph Last edited by Matthew Folks, COA on 06/12/2021  9:18 AM.     ALLERGIES No Known Allergies  PAST MEDICAL HISTORY Past Medical History:  Diagnosis Date   Cataract    CKD (chronic kidney disease)    Colon polyps    Diabetes ([place])    Diabetic polyneuropathy associated with type 2 diabetes mellitus ([place]) 01/15/2017   Diabetic retinopathy ([place])    GERD (gastroesophageal reflux disease)    Hypercholesterolemia    Hypertension    Hypertensive retinopathy    Hypothyroidism    Polyneuropathy    Proteinuria    History reviewed. No pertinent surgical history.  FAMILY HISTORY Family History  Problem Relation Age of Onset   Stroke Father  Glaucoma Father    SOCIAL HISTORY Social History   Tobacco Use   Smoking status: Former   Smokeless tobacco: Never  Scientific laboratory technician Use: Never used  Substance Use Topics   Alcohol use: Yes   Drug use: No       OPHTHALMIC EXAM: Base Eye Exam     Visual Acuity (Snellen - Linear)       Right Left   Dist [place] 20/40 +2 20/40 -2   Dist ph [place] 20/25 -2         Tonometry (Tonopen, 9:21 AM)       Right Left   Pressure 18 19         Pupils       Dark Light Shape React APD   Right 3 2 Round Brisk None   Left 3 2 Round Slow None         Visual Fields (Counting fingers)       Left Right     Full Full         Extraocular Movement       Right Left    Full Full         Neuro/Psych     Oriented x3: Yes   Mood/Affect: Normal           Slit Lamp and Fundus Exam     External Exam       Right Left   External Normal Normal         Slit Lamp Exam       Right Left   Lids/Lashes Dermatochalasis - upper lid, mild Telangiectasia Dermatochalasis - upper lid, Meibomian gland dysfunction   Conjunctiva/Sclera White and quiet White and quiet   Cornea 2+ fine, inferior Punctate epithelial erosions 1+ Punctate epithelial erosions, mild tear film debris   Anterior Chamber deep, clear, narrow angles deep, clear, narrow angles   Iris Round and dilated, mild anterior bowing, No NVI Round and dilated, mild anterior bowing, No NVI   Lens 2-3+ Nuclear sclerosis, 2+ Cortical cataract 2-3+ Nuclear sclerosis, 2+ Cortical cataract   Anterior Vitreous Vitreous syneresis Vitreous syneresis         Fundus Exam       Right Left   Disc Pink and Sharp, no NVD Pink and Sharp, no NVD   C/D Ratio 0.3 0.3   Macula Blunted foveal reflex, mild ERM, scattered MA and exudate, central edema Flat, Blunted foveal reflex, scattered MA and focal exudate, central edema   Vessels attenuated, Tortuous attenuated, Tortuous, Copper wiring, Macroaneurysm along IT arcades   Periphery Attached, scattered MA/DBH    Attached, scattered MA/DBH              IMAGING AND PROCEDURES  Imaging and Procedures for 06/12/2021  OCT, Retina - OU - Both Eyes       Right Eye Quality was good. Central Foveal Thickness: 447. Progression has been stable. Findings include abnormal foveal contour, intraretinal fluid, no SRF, intraretinal hyper-reflective material, macular pucker, epiretinal membrane.   Left Eye Quality was good. Central Foveal Thickness: 320. Progression has improved. Findings include intraretinal fluid, no SRF, intraretinal hyper-reflective material, abnormal foveal contour (Focal IRHM and  retinal thickening along IT arcades -- likely macroaneurysm; interval improvement in IRF).   Notes *Images captured and stored on drive  Diagnosis / Impression:  +DME OU OS: Focal IRHM and retinal thickening along IT arcades -- +macroaneurysm; interval improvement in IRF  Clinical management:  See below  Abbreviations: NFP - Normal foveal profile. CME - cystoid macular edema. PED - pigment epithelial detachment. IRF - intraretinal fluid. SRF - subretinal fluid. EZ - ellipsoid zone. ERM - epiretinal membrane. ORA - outer retinal atrophy. ORT - outer retinal tubulation. SRHM - subretinal hyper-reflective material. IRHM - intraretinal hyper-reflective material      Intravitreal Injection, Pharmacologic Agent - OD - Right Eye       Time Out 06/12/2021. 9:37 AM. Confirmed correct patient, procedure, site, and patient consented.   Anesthesia Topical anesthesia was used. Anesthetic medications included Lidocaine 2%, Proparacaine 0.5%.   Procedure A (32g) needle was used.   Injection: 1.25 mg Bevacizumab 1.25mg /0.80ml   Route: Intravitreal, Site: Right Eye   NDC: H061816, Lot: [id number], Expiration date: 07/30/2021, Waste: 0.05 mL   Post-op Post injection exam found visual acuity of at least counting fingers. The patient tolerated the procedure well. There were no complications. The patient received written and verbal post procedure care education.            ASSESSMENT/PLAN:    ICD-10-CM   1. Moderate nonproliferative diabetic retinopathy of both eyes with macular edema associated with type 2 diabetes mellitus (HCC)  O75.6433 Intravitreal Injection, Pharmacologic Agent - OD - Right Eye    Bevacizumab (AVASTIN) SOLN 1.25 mg    2. Retinal edema  H35.81 OCT, Retina - OU - Both Eyes    3. Retinal macroaneurysm of left eye  H35.012     4. Essential hypertension  I10     5. Hypertensive retinopathy of both eyes  H35.033     6. Combined forms of age-related cataract of both  eyes  H25.813      1,2. Moderate Non-proliferative diabetic retinopathy w/ DME OU  - s/p IVA OS #1 (11.14.22) - exam shows scattered MA/DBH OU, +edema and exudates - FA (11.14.22) shows vascular perfusion defects OU, no NV OU, OS with focal macroaneurysm along IT arcades - OCT shows OS: Focal IRHM and retinal thickening along IT arcades -- likely macroaneurysm; interval improvement in IRF - recommend IVA OD #1 today, 11.17.22 - pt wishes to proceed with injection - RBA of procedure discussed, questions answered - IVA informed consent obtained and signed OU, 11.14.22 - see procedure note - f/u in 4 weeks, DFE, OCT, possible injection(s)  3. Retinal macroaneurysm OS  - focal lesion along IT arcades  - s/p IVA OS #1, 11.14.22, as above  4,5. Hypertensive retinopathy OU - discussed importance of tight BP control - monitor  6.  Mixed Cataract OU - The symptoms of cataract, surgical options, and treatments and risks were discussed with patient. - discussed diagnosis and progression - approaching visual significance   Ophthalmic Meds Ordered this visit:  Meds ordered this encounter  Medications   Bevacizumab (AVASTIN) SOLN 1.25 mg     Return in about 4 weeks (around 07/10/2021) for f/u NPDR OU, DFE, OCT.  There are no Patient Instructions on file for this visit.   Explained the diagnoses, plan, and follow up with the patient and they expressed understanding.  Patient expressed understanding of the importance of proper follow up care.   This document serves as a record of services personally performed by Gardiner Sleeper, MD, PhD. It was created on their behalf by San Jetty. Owens Shark, OA an ophthalmic technician. The creation of this record is the provider's dictation and/or activities during the visit.    Electronically signed by: San Jetty. Teller, [place] 11.15.2022 9:54 AM   Gardiner Sleeper,  M.D., Ph.D. Diseases & Surgery of the Retina and [place]  I have reviewed the above documentation for accuracy and completeness, and I agree with the above. Gardiner Sleeper, M.D., Ph.D. 06/12/21 9:54 AM   Abbreviations: M myopia (nearsighted); A astigmatism; H hyperopia (farsighted); P presbyopia; Mrx spectacle prescription;  CTL contact lenses; OD right eye; OS left eye; OU both eyes  XT exotropia; ET esotropia; PEK punctate epithelial keratitis; PEE punctate epithelial erosions; DES dry eye syndrome; MGD meibomian gland dysfunction; ATs artificial tears; PFAT's preservative free artificial tears; [place] nuclear sclerotic cataract; PSC posterior subcapsular cataract; ERM epi-retinal membrane; PVD posterior vitreous detachment; RD retinal detachment; DM diabetes mellitus; DR diabetic retinopathy; NPDR non-proliferative diabetic retinopathy; PDR proliferative diabetic retinopathy; CSME clinically significant macular edema; DME diabetic macular edema; dbh dot blot hemorrhages; CWS cotton wool spot; POAG primary open angle glaucoma; C/D cup-to-disc ratio; HVF humphrey visual field; GVF goldmann visual field; OCT optical coherence tomography; IOP intraocular pressure; BRVO Branch retinal vein occlusion; CRVO central retinal vein occlusion; CRAO central retinal artery occlusion; BRAO branch retinal artery occlusion; RT retinal tear; SB scleral buckle; PPV pars plana vitrectomy; VH Vitreous hemorrhage; PRP panretinal laser photocoagulation; IVK intravitreal kenalog; VMT vitreomacular traction; MH Macular hole;  NVD neovascularization of the disc; NVE neovascularization elsewhere; AREDS age related eye disease study; ARMD age related macular degeneration; POAG primary open angle glaucoma; EBMD epithelial/anterior basement membrane dystrophy; ACIOL anterior chamber intraocular lens; IOL intraocular lens; PCIOL posterior chamber intraocular lens; Phaco/IOL phacoemulsification with intraocular lens placement; [place] photorefractive keratectomy; LASIK laser assisted in situ  keratomileusis; HTN hypertension; DM diabetes mellitus; COPD chronic obstructive pulmonary disease

## 2021-06-12 ENCOUNTER — Other Ambulatory Visit: Payer: Self-pay

## 2021-06-12 ENCOUNTER — Encounter (INDEPENDENT_AMBULATORY_CARE_PROVIDER_SITE_OTHER): Payer: Self-pay | Admitting: Ophthalmology

## 2021-06-12 ENCOUNTER — Ambulatory Visit (INDEPENDENT_AMBULATORY_CARE_PROVIDER_SITE_OTHER): Payer: PPO | Admitting: Ophthalmology

## 2021-06-12 DIAGNOSIS — E113313 Type 2 diabetes mellitus with moderate nonproliferative diabetic retinopathy with macular edema, bilateral: Secondary | ICD-10-CM

## 2021-06-12 DIAGNOSIS — H3581 Retinal edema: Secondary | ICD-10-CM

## 2021-06-12 DIAGNOSIS — I10 Essential (primary) hypertension: Secondary | ICD-10-CM

## 2021-06-12 DIAGNOSIS — H35033 Hypertensive retinopathy, bilateral: Secondary | ICD-10-CM | POA: Diagnosis not present

## 2021-06-12 DIAGNOSIS — H35012 Changes in retinal vascular appearance, left eye: Secondary | ICD-10-CM

## 2021-06-12 DIAGNOSIS — H25813 Combined forms of age-related cataract, bilateral: Secondary | ICD-10-CM

## 2021-06-12 MED ORDER — BEVACIZUMAB CHEMO INJECTION 1.25MG/0.05ML SYRINGE FOR KALEIDOSCOPE
1.2500 mg | INTRAVITREAL | Status: AC | PRN
Start: 2021-06-12 — End: 2021-06-12
  Administered 2021-06-12: 10:00:00 1.25 mg via INTRAVITREAL

## 2021-06-13 ENCOUNTER — Encounter (INDEPENDENT_AMBULATORY_CARE_PROVIDER_SITE_OTHER): Payer: Self-pay | Admitting: Ophthalmology

## 2021-06-16 DIAGNOSIS — N1832 Chronic kidney disease, stage 3b: Secondary | ICD-10-CM | POA: Diagnosis not present

## 2021-06-23 NOTE — Progress Notes (Signed)
Cardiology Office Note   Date:  06/27/2021   ID:  Chad [name], DOB [birth year], MRN [id number]  PCP:  Seward Carol, MD  Cardiologist:   Jenkins Rouge, MD   No chief complaint on file.     History of Present Illness:  75 y.o. with DM, HTN, HLD, hypothyroidism and polyneuropathy Seen in f/u for AS  Echo 06/30/19  EF 60-65% mild AS mean gradient 19 mmHg peak 35 mmHg Echo 12/7/21reviewed progression of AS to moderate mean gradient 29 mmHg peak 50 mmHg  And DVI 0.28  Echo 06/27/2021 mean gradient 31 mmHg peak 56.9 mmHg DVI 0.28   Normal ETT 09/03/17 maximum HR was only 110 73% PMHR Calcium score 08/27/17  795 81 st percentile dense in LAD he is on statin  Myovue Lexiscan normal 01/31/20 no ischemia EF 60%   Started on Pamelor for depression   Seeing neurology for peripheral neuropathy in feet On gabapentin and nortriptyline   Friends with [name] one of my patients Met at [facility] had CRF with Cr as high as 1.8 on ARB per nephrology   Denies dyspnea, chest pain or syncope   He has a son who is a GI doctor in [facility]. Moving to [place] in Spring   Past Medical History:  Diagnosis Date   Cataract    CKD (chronic kidney disease)    Colon polyps    Diabetes ([place])    Diabetic polyneuropathy associated with type 2 diabetes mellitus ([place]) 01/15/2017   Diabetic retinopathy ([place])    GERD (gastroesophageal reflux disease)    Hypercholesterolemia    Hypertension    Hypertensive retinopathy    Hypothyroidism    Polyneuropathy    Proteinuria     No past surgical history on file.   Current Outpatient Medications  Medication Sig Dispense Refill   B-D ULTRAFINE III SHORT PEN 31G X 8 MM MISC SMARTSIG:1 Each SUB-Q Daily     Calcium Carb-Cholecalciferol (CALCIUM 500 + D) 500-125 MG-UNIT TABS Take 1 tablet by mouth daily.     FLUZONE HIGH-DOSE QUADRIVALENT 0.7 ML SUSY      gabapentin (NEURONTIN) 300 MG capsule TAKE 1 CAPSULE BY MOUTH EVERY MORNING, TAKE 1 CAPSULE BY MOUTH  DAILY IN THE AFTERNOON, AND TAKE 2 CAPSULES BY MOUTH AT BEDTIME 120 capsule 1   insulin glargine (LANTUS) 100 UNIT/ML injection Inject 10 Units into the skin daily.     JARDIANCE 10 MG TABS tablet Take 10 mg by mouth daily.     levothyroxine (SYNTHROID, LEVOTHROID) 75 MCG tablet Take 75 mcg by mouth daily before breakfast.      losartan (COZAAR) 25 MG tablet Take 25 mg by mouth daily.     metoprolol succinate (TOPROL-XL) 25 MG 24 hr tablet TAKE ONE TABLET BY MOUTH DAILY 90 tablet 3   nortriptyline (PAMELOR) 10 MG capsule TAKE THREE CAPSULES BY MOUTH EVERY NIGHT AT BEDTIME 270 capsule 3   ONETOUCH VERIO test strip      rosuvastatin (CRESTOR) 40 MG tablet Take 1 tablet by mouth daily.     No current facility-administered medications for this visit.    Allergies:   Patient has no known allergies.    Social History:  The patient  reports that he has quit smoking. He has never used smokeless tobacco. He reports current alcohol use. He reports that he does not use drugs.   Family History:  The patient's family history includes Glaucoma in his father; Stroke in his  father.    ROS:  Please see the history of present illness.   Otherwise, review of systems are positive for none.   All other systems are reviewed and negative.    PHYSICAL EXAM: VS:  There were no vitals taken for this visit. , BMI There is no height or weight on file to calculate BMI. Affect appropriate Healthy:  appears stated age 28: normal Neck supple with no adenopathy JVP normal no bruits no thyromegaly Lungs clear with no wheezing and good diaphragmatic motion Heart:  S1/S2 AS  murmur, no rub, gallop or click PMI normal Abdomen: benighn, BS positve, no tenderness, no AAA no bruit.  No HSM or HJR Distal pulses intact with no bruits No edema Neuro sensory neuropathy in arms feet  Skin warm and dry No muscular weakness   EKG:  03/28/19 SR rate 77 QT 378    Recent Labs: No results found for requested labs  within last 8760 hours.    Lipid Panel No results found for: CHOL, TRIG, HDL, CHOLHDL, VLDL, LDLCALC, LDLDIRECT    Wt Readings from Last 3 Encounters:  04/28/21 186 lb 6.4 oz (84.6 kg)  01/14/21 183 lb 3.2 oz (83.1 kg)  07/02/20 194 lb 9.6 oz (88.3 kg)      Other studies Reviewed: Additional studies/ records that were reviewed today include: Office notes neurology and primary .    ASSESSMENT AND PLAN:  1.  HTN: nephrology changed norvasc to ARB stable  2. DM Discussed low carb diet.  Target hemoglobin A1c is 6.5 or less.  Continue current medications. A1c 6.2 3. HLD on statin labs with primary  4. Neuropathy f/u neuro Dr Posey Pronto severe sensorimotor polyneuropathy Rx with gabapentin and nortriptyline  5. Thyroid on replacement TSH normal  6. CAD:  High calcium score 08/27/17 795 81 st percentile dense in LAD Normal lexiscan myovue 01/31/20 stable  7. AS:  TTE 06/27/2021 with mean gradient 31 mmHg peak 56.9 mmHg DVI 0.28 again some progression Discussed likely need for TAVR in next 2 years f/u with me with echo 6 months  8. Neuropathy :  F/U Dr Posey Pronto isolated to feet DM well controlled continue gabapentin and nortriptyline     Current medicines are reviewed at length with the patient today.  The patient does not have concerns regarding medicines.  The following changes have been made:  no change  Labs/ tests ordered today include:   Echo for AS 6 months    Disposition:   FU with cardiology  6 months     Signed, Jenkins Rouge, MD  06/27/2021 2:11 PM    [facility] [place], [place], [place]  [address] Phone: [phone]; Fax: [phone]

## 2021-06-27 ENCOUNTER — Encounter: Payer: Self-pay | Admitting: Cardiovascular Disease

## 2021-06-27 ENCOUNTER — Ambulatory Visit (HOSPITAL_COMMUNITY): Payer: PPO | Attending: Cardiovascular Disease

## 2021-06-27 ENCOUNTER — Other Ambulatory Visit: Payer: Self-pay

## 2021-06-27 ENCOUNTER — Ambulatory Visit: Payer: PPO | Admitting: Cardiovascular Disease

## 2021-06-27 VITALS — BP 128/72 | HR 57 | Ht 68.0 in | Wt 183.0 lb

## 2021-06-27 DIAGNOSIS — I2510 Atherosclerotic heart disease of native coronary artery without angina pectoris: Secondary | ICD-10-CM

## 2021-06-27 DIAGNOSIS — I10 Essential (primary) hypertension: Secondary | ICD-10-CM | POA: Diagnosis not present

## 2021-06-27 DIAGNOSIS — I350 Nonrheumatic aortic (valve) stenosis: Secondary | ICD-10-CM | POA: Diagnosis not present

## 2021-06-27 DIAGNOSIS — E782 Mixed hyperlipidemia: Secondary | ICD-10-CM

## 2021-06-27 NOTE — Patient Instructions (Addendum)
Medication Instructions:  Your physician recommends that you continue on your current medications as directed. Please refer to the Current Medication list given to you today.  *If you need a refill on your cardiac medications before your next appointment, please call your pharmacy*   Lab Work: NONE If you have labs (blood work) drawn today and your tests are completely normal, you will receive your results only by: [place] (if you have MyChart) OR A paper copy in the mail If you have any lab test that is abnormal or we need to change your treatment, we will call you to review the results.   Testing/Procedures: Your physician has requested that you have an echocardiogram. Echocardiography is a painless test that uses sound waves to create images of your heart. It provides your doctor with information about the size and shape of your heart and how well your heart's chambers and valves are working. This procedure takes approximately one hour. There are no restrictions for this procedure. TO BE DONE IN 6 MONTHS   Follow-Up: At [facility], you and your health needs are our priority.  As part of our continuing mission to provide you with exceptional heart care, we have created designated Provider Care Teams.  These Care Teams include your primary Cardiologist (physician) and Advanced Practice Providers (APPs -  Physician Assistants and Nurse Practitioners) who all work together to provide you with the care you need, when you need it.  We recommend signing up for the patient portal called "MyChart".  Sign up information is provided on this After Visit Summary.  MyChart is used to connect with patients for Virtual Visits (Telemedicine).  Patients are able to view lab/test results, encounter notes, upcoming appointments, etc.  Non-urgent messages can be sent to your provider as well.   To learn more about what you can do with MyChart, go to [email or web address].    Your next appointment:    6 month(s)  The format for your next appointment:   In Person  Provider:   Jenkins Rouge, MD   Other Instructions Echocardiogram An echocardiogram is a test that uses sound waves (ultrasound) to produce images of the heart. Images from an echocardiogram can provide important information about: Heart size and shape. The size and thickness and movement of your heart's walls. Heart muscle function and strength. Heart valve function or if you have stenosis. Stenosis is when the heart valves are too narrow. If blood is flowing backward through the heart valves (regurgitation). A tumor or infectious growth around the heart valves. Areas of heart muscle that are not working well because of poor blood flow or injury from a heart attack. Aneurysm detection. An aneurysm is a weak or damaged part of an artery wall. The wall bulges out from the normal force of blood pumping through the body. Tell a health care provider about: Any allergies you have. All medicines you are taking, including vitamins, herbs, eye drops, creams, and over-the-counter medicines. Any blood disorders you have. Any surgeries you have had. Any medical conditions you have. Whether you are pregnant or may be pregnant. What are the risks? Generally, this is a safe test. However, problems may occur, including an allergic reaction to dye (contrast) that may be used during the test. What happens before the test? No specific preparation is needed. You may eat and drink normally. What happens during the test?  You will take off your clothes from the waist up and put on a hospital gown. Electrodes or electrocardiogram (  ECG)patches may be placed on your chest. The electrodes or patches are then connected to a device that monitors your heart rate and rhythm. You will lie down on a table for an ultrasound exam. A gel will be applied to your chest to help sound waves pass through your skin. A handheld device, called a transducer,  will be pressed against your chest and moved over your heart. The transducer produces sound waves that travel to your heart and bounce back (or "echo" back) to the transducer. These sound waves will be captured in real-time and changed into images of your heart that can be viewed on a video monitor. The images will be recorded on a computer and reviewed by your health care provider. You may be asked to change positions or hold your breath for a short time. This makes it easier to get different views or better views of your heart. In some cases, you may receive contrast through an IV in one of your veins. This can improve the quality of the pictures from your heart. The procedure may vary among health care providers and hospitals. What can I expect after the test? You may return to your normal, everyday life, including diet, activities, and medicines, unless your health care provider tells you not to do that. Follow these instructions at home: It is up to you to get the results of your test. Ask your health care provider, or the department that is doing the test, when your results will be ready. Keep all follow-up visits. This is important. Summary An echocardiogram is a test that uses sound waves (ultrasound) to produce images of the heart. Images from an echocardiogram can provide important information about the size and shape of your heart, heart muscle function, heart valve function, and other possible heart problems. You do not need to do anything to prepare before this test. You may eat and drink normally. After the echocardiogram is completed, you may return to your normal, everyday life, unless your health care provider tells you not to do that. This information is not intended to replace advice given to you by your health care provider. Make sure you discuss any questions you have with your health care provider. Document Revised: 03/26/2021 Document Reviewed: 03/05/2020 Elsevier Patient  Education  2022 [employer].

## 2021-06-28 LAB — ECHOCARDIOGRAM COMPLETE
AR max vel: 0.71 cm2
AV Area VTI: 0.78 cm2
AV Area mean vel: 0.71 cm2
AV Mean grad: 39.5 mmHg
AV Peak grad: 69.9 mmHg
Ao pk vel: 4.18 m/s
Area-P 1/2: 2.59 cm2
S' Lateral: 2 cm

## 2021-07-08 NOTE — Progress Notes (Signed)
[facility] Note  07/10/2021    CHIEF COMPLAINT Patient presents for Retina Follow Up   HISTORY OF PRESENT ILLNESS: Chad [name] is a 75 y.o. male who presents to the clinic today for:   HPI     Retina Follow Up   Patient presents with  Diabetic Retinopathy.  In both eyes.  This started 4 weeks ago.  I, the attending physician,  performed the HPI with the patient and updated documentation appropriately.        Comments   Patient here for 4 weeks retina follow up for NPDR OU. Patient states vision about like it was. A tad better.      Last edited by Bernarda Caffey, MD on 07/10/2021 10:56 AM.     Pt is  Referring physician: Seward Carol, MD [address],  [place] [id number]  HISTORICAL INFORMATION:   Selected notes from the MEDICAL RECORD NUMBER Referred by Dr. Delman Cheadle for diabetic retinopathy with DME OU [name]:  Ocular Hx- PMH-    CURRENT MEDICATIONS: No current outpatient medications on file. (Ophthalmic Drugs)   No current facility-administered medications for this visit. (Ophthalmic Drugs)   Current Outpatient Medications (Other)  Medication Sig   B-D ULTRAFINE III SHORT PEN 31G X 8 MM MISC SMARTSIG:1 Each SUB-Q Daily   Calcium Carb-Cholecalciferol (CALCIUM 500 + D) 500-125 MG-UNIT TABS Take 1 tablet by mouth daily.   gabapentin (NEURONTIN) 300 MG capsule TAKE 1 CAPSULE BY MOUTH EVERY MORNING, TAKE 1 CAPSULE BY MOUTH DAILY IN THE AFTERNOON, AND TAKE 2 CAPSULES BY MOUTH AT BEDTIME   insulin glargine (LANTUS) 100 UNIT/ML injection Inject 10 Units into the skin daily.   JARDIANCE 10 MG TABS tablet Take 10 mg by mouth daily.   levothyroxine (SYNTHROID, LEVOTHROID) 75 MCG tablet Take 75 mcg by mouth daily before breakfast.    losartan (COZAAR) 25 MG tablet Take 25 mg by mouth daily. bid   metoprolol succinate (TOPROL-XL) 25 MG 24 hr tablet TAKE ONE TABLET BY MOUTH DAILY   nortriptyline (PAMELOR) 10 MG capsule TAKE  THREE CAPSULES BY MOUTH EVERY NIGHT AT BEDTIME   ONETOUCH VERIO test strip    rosuvastatin (CRESTOR) 40 MG tablet Take 1 tablet by mouth daily.   No current facility-administered medications for this visit. (Other)   REVIEW OF SYSTEMS: ROS   Positive for: Gastrointestinal, Genitourinary, Endocrine, Eyes Negative for: Constitutional, Neurological, Skin, Musculoskeletal, HENT, Cardiovascular, Respiratory, Psychiatric, Allergic/Imm, Heme/Lymph Last edited by Theodore Demark, COA on 07/10/2021  8:54 AM.      ALLERGIES No Known Allergies  PAST MEDICAL HISTORY Past Medical History:  Diagnosis Date   Cataract    CKD (chronic kidney disease)    Colon polyps    Diabetes ([place])    Diabetic polyneuropathy associated with type 2 diabetes mellitus ([place]) 01/15/2017   Diabetic retinopathy ([place])    GERD (gastroesophageal reflux disease)    Hypercholesterolemia    Hypertension    Hypertensive retinopathy    Hypothyroidism    Polyneuropathy    Proteinuria    History reviewed. No pertinent surgical history.  FAMILY HISTORY Family History  Problem Relation Age of Onset   Stroke Father    Glaucoma Father    SOCIAL HISTORY Social History   Tobacco Use   Smoking status: Former   Smokeless tobacco: Never  Scientific laboratory technician Use: Never used  Substance Use Topics   Alcohol use: Yes   Drug use:  No       OPHTHALMIC EXAM: Base Eye Exam     Visual Acuity (Snellen - Linear)       Right Left   Dist [place] 20/40 20/40 -2   Dist ph [place] NI 20/25 -2         Tonometry (Tonopen, 8:51 AM)       Right Left   Pressure 18 16         Pupils       Dark Light Shape React APD   Right 3 2 Round Brisk None   Left 3 2 Round Slow None         Visual Fields (Counting fingers)       Left Right    Full Full         Extraocular Movement       Right Left    Full, Ortho Full, Ortho         Neuro/Psych     Oriented x3: Yes   Mood/Affect: Normal         Dilation      Both eyes: 1.0% Mydriacyl, 2.5% Phenylephrine @ 8:51 AM           Slit Lamp and Fundus Exam     External Exam       Right Left   External Normal Normal         Slit Lamp Exam       Right Left   Lids/Lashes Dermatochalasis - upper lid, mild Telangiectasia Dermatochalasis - upper lid, Meibomian gland dysfunction   Conjunctiva/Sclera White and quiet White and quiet   Cornea 2+ fine, inferior Punctate epithelial erosions 1+ Punctate epithelial erosions, mild tear film debris   Anterior Chamber deep, clear, narrow angles deep, clear, narrow angles   Iris Round and dilated, mild anterior bowing, No NVI Round and dilated, mild anterior bowing, No NVI   Lens 2-3+ Nuclear sclerosis, 2+ Cortical cataract 2-3+ Nuclear sclerosis, 2+ Cortical cataract   Anterior Vitreous Vitreous syneresis Vitreous syneresis         Fundus Exam       Right Left   Disc Pink and Sharp, no NVD Pink and Sharp, no NVD   C/D Ratio 0.3 0.3   Macula Blunted foveal reflex, mild ERM, scattered MA and exudate, central edema - improved Flat, good foveal reflex, scattered MA and focal exudate, scattered cystic changes   Vessels attenuated, Tortuous attenuated, Tortuous, Copper wiring, Macroaneurysm along IT arcades - improving   Periphery Attached, scattered MA/DBH    Attached, scattered MA/DBH greatest posteriorly           IMAGING AND PROCEDURES  Imaging and Procedures for 07/10/2021  OCT, Retina - OU - Both Eyes       Right Eye Quality was good. Central Foveal Thickness: 426. Progression has improved. Findings include abnormal foveal contour, intraretinal fluid, no SRF, intraretinal hyper-reflective material, macular pucker, epiretinal membrane (Interval improvement in IRF).   Left Eye Quality was good. Central Foveal Thickness: 315. Progression has improved. Findings include intraretinal fluid, no SRF, intraretinal hyper-reflective material, abnormal foveal contour (interval improvement in IRF/IRHM  and foveal contour, Focal IRHM and retinal thickening along IT arcades -- macroaneurysm -- slightly improved).   Notes *Images captured and stored on drive  Diagnosis / Impression:  +DME OU OD: Interval improvement in IRF OS: interval improvement in IRF/IRHM and foveal contour, Focal IRHM and retinal thickening along IT arcades -- macroaneurysm -- slightly improved  Clinical management:  See  below  Abbreviations: NFP - Normal foveal profile. CME - cystoid macular edema. PED - pigment epithelial detachment. IRF - intraretinal fluid. SRF - subretinal fluid. EZ - ellipsoid zone. ERM - epiretinal membrane. ORA - outer retinal atrophy. ORT - outer retinal tubulation. SRHM - subretinal hyper-reflective material. IRHM - intraretinal hyper-reflective material      Intravitreal Injection, Pharmacologic Agent - OD - Right Eye       Time Out 07/10/2021. 9:22 AM. Confirmed correct patient, procedure, site, and patient consented.   Anesthesia Topical anesthesia was used. Anesthetic medications included Proparacaine 0.5%, Lidocaine 2%.   Procedure Preparation included 5% betadine to ocular surface, eyelid speculum. A supplied needle was used.   Injection: 1.25 mg Bevacizumab 1.25mg /0.20ml   Route: Intravitreal, Site: Right Eye   NDC: H061816, Lot: [id number] , Expiration date: 09/02/2021, Waste: 0 mL   Post-op Post injection exam found visual acuity of at least counting fingers. The patient tolerated the procedure well. There were no complications. The patient received written and verbal post procedure care education. Post injection medications were not given.      Intravitreal Injection, Pharmacologic Agent - OS - Left Eye       Time Out 07/10/2021. 9:22 AM. Confirmed correct patient, procedure, site, and patient consented.   Anesthesia Topical anesthesia was used. Anesthetic medications included Proparacaine 0.5%, Lidocaine 2%.   Procedure Preparation included 5% betadine to  ocular surface, eyelid speculum. A (32g) needle was used.   Injection: 1.25 mg Bevacizumab 1.25mg /0.21ml   Route: Intravitreal, Site: Left Eye   NDC: 80m, Lot: [id number], Expiration date: 07/30/2021, Waste: 0.5 mL   Post-op Post injection exam found visual acuity of at least counting fingers. The patient tolerated the procedure well. There were no complications. The patient received written and verbal post procedure care education. Post injection medications were not given.             ASSESSMENT/PLAN:    ICD-10-CM   1. Moderate nonproliferative diabetic retinopathy of both eyes with macular edema associated with type 2 diabetes mellitus (HCC)  E11.3313 OCT, Retina - OU - Both Eyes    Intravitreal Injection, Pharmacologic Agent - OD - Right Eye    Intravitreal Injection, Pharmacologic Agent - OS - Left Eye    Bevacizumab (AVASTIN) SOLN 1.25 mg    Bevacizumab (AVASTIN) SOLN 1.25 mg    2. 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 Essential hypertension  I10     4. Hypertensive retinopathy of both eyes  H35.033     5. Combined forms of age-related cataract of both eyes  H25.813      1. Moderate Non-proliferative diabetic retinopathy w/ DME OU  - s/p IVA OS #1 (11.14.22)  - s/p IVA OD #1 (11.17.22) - exam shows scattered MA/DBH OU, +edema and exudates - FA (11.14.22) shows vascular perfusion defects OU, no NV OU, OS with focal macroaneurysm along IT arcades - OCT shows OD: Interval improvement in IRF; OS: interval improvement in IRF/IRHM and foveal contour, Focal IRHM and retinal thickening along IT arcades -- macroaneurysm -- slightly improved - recommend IVA OU #2 today, 12.15.22 - pt wishes to proceed with injection - RBA of procedure discussed, questions answered - IVA informed consent obtained and signed OU, 11.14.22 - see procedure note - f/u in 4 weeks, DFE, OCT, possible injection(s)  2. Retinal macroaneurysm OS  - focal lesion along IT arcades  - s/p  IVA OS #1, 11.14.22, as above  - OCT shows  mild interval improvement  - recommend IVA OS #2 as above  3,4. Hypertensive retinopathy OU - discussed importance of tight BP control - monitor  5.  Mixed Cataract OU - The symptoms of cataract, surgical options, and treatments and risks were discussed with patient. - discussed diagnosis and progression - approaching visual significance   Ophthalmic Meds Ordered this visit:  Meds ordered this encounter  Medications   Bevacizumab (AVASTIN) SOLN 1.25 mg   Bevacizumab (AVASTIN) SOLN 1.25 mg      Return in about 4 weeks (around 08/07/2021) for f/u NPDR OU, DFE.  There are no Patient Instructions on file for this visit.   Explained the diagnoses, plan, and follow up with the patient and they expressed understanding.  Patient expressed understanding of the importance of proper follow up care.   This document serves as a record of services personally performed by Gardiner Sleeper, MD, PhD. It was created on their behalf by San Jetty. Owens Shark, OA an ophthalmic technician. The creation of this record is the provider's dictation and/or activities during the visit.    Electronically signed by: San Jetty. Owens Shark, [place] 12.13.2022 10:58 AM  Gardiner Sleeper, M.D., Ph.D. Diseases & Surgery of the Retina and Vitreous [facility]  I have reviewed the above documentation for accuracy and completeness, and I agree with the above. Gardiner Sleeper, M.D., Ph.D. 07/10/21 10:58 AM  Abbreviations: M myopia (nearsighted); A astigmatism; H hyperopia (farsighted); P presbyopia; Mrx spectacle prescription;  CTL contact lenses; OD right eye; OS left eye; OU both eyes  XT exotropia; ET esotropia; PEK punctate epithelial keratitis; PEE punctate epithelial erosions; DES dry eye syndrome; MGD meibomian gland dysfunction; ATs artificial tears; PFAT's preservative free artificial tears; [place] nuclear sclerotic cataract; PSC posterior subcapsular cataract;  ERM epi-retinal membrane; PVD posterior vitreous detachment; RD retinal detachment; DM diabetes mellitus; DR diabetic retinopathy; NPDR non-proliferative diabetic retinopathy; PDR proliferative diabetic retinopathy; CSME clinically significant macular edema; DME diabetic macular edema; dbh dot blot hemorrhages; CWS cotton wool spot; POAG primary open angle glaucoma; C/D cup-to-disc ratio; HVF humphrey visual field; GVF goldmann visual field; OCT optical coherence tomography; IOP intraocular pressure; BRVO Branch retinal vein occlusion; CRVO central retinal vein occlusion; CRAO central retinal artery occlusion; BRAO branch retinal artery occlusion; RT retinal tear; SB scleral buckle; PPV pars plana vitrectomy; VH Vitreous hemorrhage; PRP panretinal laser photocoagulation; IVK intravitreal kenalog; VMT vitreomacular traction; MH Macular hole;  NVD neovascularization of the disc; NVE neovascularization elsewhere; AREDS age related eye disease study; ARMD age related macular degeneration; POAG primary open angle glaucoma; EBMD epithelial/anterior basement membrane dystrophy; ACIOL anterior chamber intraocular lens; IOL intraocular lens; PCIOL posterior chamber intraocular lens; Phaco/IOL phacoemulsification with intraocular lens placement; [place] photorefractive keratectomy; LASIK laser assisted in situ keratomileusis; HTN hypertension; DM diabetes mellitus; COPD chronic obstructive pulmonary disease

## 2021-07-10 ENCOUNTER — Ambulatory Visit (INDEPENDENT_AMBULATORY_CARE_PROVIDER_SITE_OTHER): Payer: PPO | Admitting: Ophthalmology

## 2021-07-10 ENCOUNTER — Other Ambulatory Visit: Payer: Self-pay

## 2021-07-10 ENCOUNTER — Encounter (INDEPENDENT_AMBULATORY_CARE_PROVIDER_SITE_OTHER): Payer: Self-pay | Admitting: Ophthalmology

## 2021-07-10 DIAGNOSIS — H35033 Hypertensive retinopathy, bilateral: Secondary | ICD-10-CM

## 2021-07-10 DIAGNOSIS — H35012 Changes in retinal vascular appearance, left eye: Secondary | ICD-10-CM | POA: Diagnosis not present

## 2021-07-10 DIAGNOSIS — E113313 Type 2 diabetes mellitus with moderate nonproliferative diabetic retinopathy with macular edema, bilateral: Secondary | ICD-10-CM

## 2021-07-10 DIAGNOSIS — H25813 Combined forms of age-related cataract, bilateral: Secondary | ICD-10-CM

## 2021-07-10 DIAGNOSIS — I10 Essential (primary) hypertension: Secondary | ICD-10-CM | POA: Diagnosis not present

## 2021-07-10 DIAGNOSIS — H3581 Retinal edema: Secondary | ICD-10-CM

## 2021-07-10 MED ORDER — BEVACIZUMAB CHEMO INJECTION 1.25MG/0.05ML SYRINGE FOR KALEIDOSCOPE
1.2500 mg | INTRAVITREAL | Status: AC | PRN
  Administered 2021-07-10: 1.25 mg via INTRAVITREAL

## 2021-07-11 DIAGNOSIS — E1129 Type 2 diabetes mellitus with other diabetic kidney complication: Secondary | ICD-10-CM | POA: Diagnosis not present

## 2021-07-11 DIAGNOSIS — E785 Hyperlipidemia, unspecified: Secondary | ICD-10-CM | POA: Diagnosis not present

## 2021-07-11 DIAGNOSIS — E039 Hypothyroidism, unspecified: Secondary | ICD-10-CM | POA: Diagnosis not present

## 2021-07-11 DIAGNOSIS — E7800 Pure hypercholesterolemia, unspecified: Secondary | ICD-10-CM | POA: Diagnosis not present

## 2021-07-11 DIAGNOSIS — E1165 Type 2 diabetes mellitus with hyperglycemia: Secondary | ICD-10-CM | POA: Diagnosis not present

## 2021-07-11 DIAGNOSIS — E1121 Type 2 diabetes mellitus with diabetic nephropathy: Secondary | ICD-10-CM | POA: Diagnosis not present

## 2021-07-11 DIAGNOSIS — E0840 Diabetes mellitus due to underlying condition with diabetic neuropathy, unspecified: Secondary | ICD-10-CM | POA: Diagnosis not present

## 2021-07-11 DIAGNOSIS — N1832 Chronic kidney disease, stage 3b: Secondary | ICD-10-CM | POA: Diagnosis not present

## 2021-07-11 DIAGNOSIS — E1140 Type 2 diabetes mellitus with diabetic neuropathy, unspecified: Secondary | ICD-10-CM | POA: Diagnosis not present

## 2021-07-11 DIAGNOSIS — I10 Essential (primary) hypertension: Secondary | ICD-10-CM | POA: Diagnosis not present

## 2021-07-11 DIAGNOSIS — E1122 Type 2 diabetes mellitus with diabetic chronic kidney disease: Secondary | ICD-10-CM | POA: Diagnosis not present

## 2021-07-21 ENCOUNTER — Other Ambulatory Visit: Payer: Self-pay | Admitting: Neurology

## 2021-08-05 NOTE — Progress Notes (Signed)
[facility] Note  08/07/2021    CHIEF COMPLAINT Patient presents for Retina Follow Up    HISTORY OF PRESENT ILLNESS: Chad [name] is a 76 y.o. male who presents to the clinic today for:   HPI     Retina Follow Up   Patient presents with  Diabetic Retinopathy.  In both eyes.  Duration of 4 weeks.  I, the attending physician,  performed the HPI with the patient and updated documentation appropriately.        Comments   4 week follow up NPDR OU-  When watching TV, harder to read the captions on it.  They look fuzzy.   BS 130 this am A1C 5.7      Last edited by Bernarda Caffey, MD on 08/07/2021  7:27 PM.    Pt states not much change in vision, writing on the TV seems to be fuzzy  Referring physician: Seward Carol, MD [address] [place],  [place] [address]  HISTORICAL INFORMATION:   Selected notes from the MEDICAL RECORD NUMBER Referred by Dr. Delman Cheadle for diabetic retinopathy with DME OU [name]:  Ocular Hx- PMH-    CURRENT MEDICATIONS: No current outpatient medications on file. (Ophthalmic Drugs)   No current facility-administered medications for this visit. (Ophthalmic Drugs)   Current Outpatient Medications (Other)  Medication Sig   Calcium Carb-Cholecalciferol (CALCIUM 500 + D) 500-125 MG-UNIT TABS Take 1 tablet by mouth daily.   gabapentin (NEURONTIN) 300 MG capsule TAKE ONE CAPSULE BY MOUTH EVERY MORNING, ONE CAPSULE DAILY IN THE AFTERNOON AND ONE CAPSULE EVERY NIGHT AT BEDTIME   insulin glargine (LANTUS) 100 UNIT/ML injection Inject 10 Units into the skin daily.   JARDIANCE 10 MG TABS tablet Take 10 mg by mouth daily.   levothyroxine (SYNTHROID, LEVOTHROID) 75 MCG tablet Take 75 mcg by mouth daily before breakfast.    losartan (COZAAR) 25 MG tablet Take 25 mg by mouth daily. bid   metoprolol succinate (TOPROL-XL) 25 MG 24 hr tablet TAKE ONE TABLET BY MOUTH DAILY   nortriptyline (PAMELOR) 10 MG capsule TAKE THREE CAPSULES  BY MOUTH EVERY NIGHT AT BEDTIME   rosuvastatin (CRESTOR) 40 MG tablet Take 1 tablet by mouth daily.   B-D ULTRAFINE III SHORT PEN 31G X 8 MM MISC SMARTSIG:1 Each SUB-Q Daily   ONETOUCH VERIO test strip    No current facility-administered medications for this visit. (Other)   REVIEW OF SYSTEMS: ROS   Positive for: Gastrointestinal, Genitourinary, Endocrine, Eyes Negative for: Constitutional, Neurological, Skin, Musculoskeletal, HENT, Cardiovascular, Respiratory, Psychiatric, Allergic/Imm, Heme/Lymph Last edited by Leonie Douglas, COA on 08/07/2021  9:25 AM.     ALLERGIES No Known Allergies  PAST MEDICAL HISTORY Past Medical History:  Diagnosis Date   Cataract    CKD (chronic kidney disease)    Colon polyps    Diabetes ([place])    Diabetic polyneuropathy associated with type 2 diabetes mellitus ([place]) 01/15/2017   Diabetic retinopathy ([place])    GERD (gastroesophageal reflux disease)    Hypercholesterolemia    Hypertension    Hypertensive retinopathy    Hypothyroidism    Polyneuropathy    Proteinuria    History reviewed. No pertinent surgical history.  FAMILY HISTORY Family History  Problem Relation Age of Onset   Stroke Father    Glaucoma Father    SOCIAL HISTORY Social History   Tobacco Use   Smoking status: Former   Smokeless tobacco: Never  Scientific laboratory technician Use:  Never used  Substance Use Topics   Alcohol use: Yes   Drug use: No       OPHTHALMIC EXAM: Base Eye Exam     Visual Acuity (Snellen - Linear)       Right Left   Dist Page 20/40 20/60 +1   Dist ph [place] NI 20/40 +1         Tonometry (Tonopen, 9:34 AM)       Right Left   Pressure 13 15         Pupils       Dark Light Shape React APD   Right 3 2 Round Brisk None   Left 3 2 Round Brisk None         Visual Fields (Counting fingers)       Left Right    Full Full         Extraocular Movement       Right Left    Full Full         Neuro/Psych     Oriented x3: Yes    Mood/Affect: Normal         Dilation     Both eyes: 1.0% Mydriacyl, 2.5% Phenylephrine @ 9:34 AM           Slit Lamp and Fundus Exam     External Exam       Right Left   External Normal Normal         Slit Lamp Exam       Right Left   Lids/Lashes Dermatochalasis - upper lid, mild Telangiectasia Dermatochalasis - upper lid, Meibomian gland dysfunction   Conjunctiva/Sclera White and quiet White and quiet   Cornea 2+ fine, inferior Punctate epithelial erosions 1+ Punctate epithelial erosions, mild tear film debris   Anterior Chamber deep, clear, narrow angles deep, clear, narrow angles   Iris Round and dilated, mild anterior bowing, No NVI Round and dilated, mild anterior bowing, No NVI   Lens 2-3+ Nuclear sclerosis, 2+ Cortical cataract 2-3+ Nuclear sclerosis, 2+ Cortical cataract   Anterior Vitreous Vitreous syneresis Vitreous syneresis         Fundus Exam       Right Left   Disc Pink and Sharp, no NVD Pink and Sharp, no NVD   C/D Ratio 0.3 0.3   Macula Blunted foveal reflex, mild ERM, scattered MA and exudate, central cystic changes / edema - slightly increased Flat, good foveal reflex, scattered MA and focal exudate, scattered cystic changes   Vessels attenuated, Tortuous attenuated, Tortuous, Copper wiring, Macroaneurysm along IT arcades - improving   Periphery Attached, scattered MA greatest posteriorly, focal CWS nasal to disc Attached, scattered MA/DBH greatest posteriorly           Refraction     Manifest Refraction       Sphere Cylinder Axis Dist VA   Right       Left +1.25 +0.50 165 20/40+1           IMAGING AND PROCEDURES  Imaging and Procedures for 08/07/2021  OCT, Retina - OU - Both Eyes       Right Eye Quality was good. Central Foveal Thickness: 460. Progression has worsened. Findings include abnormal foveal contour, intraretinal fluid, no SRF, intraretinal hyper-reflective material, macular pucker, epiretinal membrane (Mild interval  increase in IRF/IRHM).   Left Eye Quality was good. Central Foveal Thickness: 317. Progression has worsened. Findings include intraretinal fluid, no SRF, intraretinal hyper-reflective material, abnormal foveal contour (persistent IRF/IRHM --  slightly increased temporal macula, Focal IRHM and retinal thickening along IT arcades -- macroaneurysm -- slightly improved).   Notes *Images captured and stored on drive  Diagnosis / Impression:  +DME OU OD: Mild interval increase in IRF/IRHM OS: persistent IRF/IRHM -- slightly increased temporal macula, Focal IRHM and retinal thickening along IT arcades -- macroaneurysm -- slightly improved  Clinical management:  See below  Abbreviations: NFP - Normal foveal profile. CME - cystoid macular edema. PED - pigment epithelial detachment. IRF - intraretinal fluid. SRF - subretinal fluid. EZ - ellipsoid zone. ERM - epiretinal membrane. ORA - outer retinal atrophy. ORT - outer retinal tubulation. SRHM - subretinal hyper-reflective material. IRHM - intraretinal hyper-reflective material      Intravitreal Injection, Pharmacologic Agent - OD - Right Eye       Time Out 08/07/2021. 9:57 AM. Confirmed correct patient, procedure, site, and patient consented.   Anesthesia Topical anesthesia was used. Anesthetic medications included Proparacaine 0.5%, Lidocaine 2%.   Procedure Preparation included 5% betadine to ocular surface, eyelid speculum. A supplied needle was used.   Injection: 1.25 mg Bevacizumab 1.25mg /0.37ml   Route: Intravitreal, Site: Right Eye   NDC: H061816, Lot: [id number] , Expiration date: 08/23/2021, Waste: 0 mL   Post-op Post injection exam found visual acuity of at least counting fingers. The patient tolerated the procedure well. There were no complications. The patient received written and verbal post procedure care education. Post injection medications were not given.      Intravitreal Injection, Pharmacologic Agent - OS - Left  Eye       Time Out 08/07/2021. 9:57 AM. Confirmed correct patient, procedure, site, and patient consented.   Anesthesia Topical anesthesia was used. Anesthetic medications included Proparacaine 0.5%, Lidocaine 2%.   Procedure Preparation included 5% betadine to ocular surface, eyelid speculum. A (32g) needle was used.   Injection: 1.25 mg Bevacizumab 1.25mg /0.73ml   Route: Intravitreal, Site: Left Eye   NDC: 80m, [id number], Expiration date: 09/04/2021, Waste: 0.05 mL   Post-op Post injection exam found visual acuity of at least counting fingers. The patient tolerated the procedure well. There were no complications. The patient received written and verbal post procedure care education. Post injection medications were not given.            ASSESSMENT/PLAN:    ICD-10-CM   1. Moderate nonproliferative diabetic retinopathy of both eyes with macular edema associated with type 2 diabetes mellitus (HCC)  E11.3313 OCT, Retina - OU - Both Eyes    Intravitreal Injection, Pharmacologic Agent - OD - Right Eye    Intravitreal Injection, Pharmacologic Agent - OS - Left Eye    Bevacizumab (AVASTIN) SOLN 1.25 mg    Bevacizumab (AVASTIN) SOLN 1.25 mg    2. 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 Essential hypertension  I10     4. Hypertensive retinopathy of both eyes  H35.033     5. Combined forms of age-related cataract of both eyes  H25.813      1. Moderate Non-proliferative diabetic retinopathy w/ DME OU  - s/p IVA OS #1 (11.14.22)  - s/p IVA OD #1 (11.17.22)  - s/p IVA OU #2 (12.15.22) - exam shows scattered MA/DBH OU, +edema and exudates - FA (11.14.22) shows vascular perfusion defects OU, no NV OU, OS with focal macroaneurysm along IT arcades - OCT shows OD: Mild interval increase in IRF/IRHM; OS: persistent IRF/IRHM -- slightly increased temporal macula, Focal IRHM and retinal thickening along IT arcades -- macroaneurysm --  slightly improved - recommend IVA  OU #3 today, 01.12.23 - pt wishes to proceed with injection - RBA of procedure discussed, questions answered - IVA informed consent obtained and signed OU, 11.14.22 - see procedure note - f/u in 4 weeks, DFE, OCT, possible injection(s)  2. Retinal macroaneurysm OS  - focal lesion along IT arcades  - s/p IVA OS #1, and #2 as above  - OCT shows mild interval improvement  - recommend IVA OS #3 as above  3,4. Hypertensive retinopathy OU - discussed importance of tight BP control - monitor  5.  Mixed Cataract OU - The symptoms of cataract, surgical options, and treatments and risks were discussed with patient. - discussed diagnosis and progression - approaching visual significance   Ophthalmic Meds Ordered this visit:  Meds ordered this encounter  Medications   Bevacizumab (AVASTIN) SOLN 1.25 mg   Bevacizumab (AVASTIN) SOLN 1.25 mg      Return in about 4 weeks (around 09/04/2021) for f/u NPDR OU, DFE, OCT.  There are no Patient Instructions on file for this visit.   Explained the diagnoses, plan, and follow up with the patient and they expressed understanding.  Patient expressed understanding of the importance of proper follow up care.   This document serves as a record of services personally performed by Gardiner Sleeper, MD, PhD. It was created on their behalf by San Jetty. Owens Shark, OA an ophthalmic technician. The creation of this record is the provider's dictation and/or activities during the visit.    Electronically signed by: San Jetty. Owens Shark, [place] 01.10.2023 7:30 PM   Gardiner Sleeper, M.D., Ph.D. Diseases & Surgery of the Retina and Vitreous [facility]  I have reviewed the above documentation for accuracy and completeness, and I agree with the above. Gardiner Sleeper, M.D., Ph.D. 08/07/21 7:32 PM   Abbreviations: M myopia (nearsighted); A astigmatism; H hyperopia (farsighted); P presbyopia; Mrx spectacle prescription;  CTL contact lenses; OD right  eye; OS left eye; OU both eyes  XT exotropia; ET esotropia; PEK punctate epithelial keratitis; PEE punctate epithelial erosions; DES dry eye syndrome; MGD meibomian gland dysfunction; ATs artificial tears; PFAT's preservative free artificial tears; [place] nuclear sclerotic cataract; PSC posterior subcapsular cataract; ERM epi-retinal membrane; PVD posterior vitreous detachment; RD retinal detachment; DM diabetes mellitus; DR diabetic retinopathy; NPDR non-proliferative diabetic retinopathy; PDR proliferative diabetic retinopathy; CSME clinically significant macular edema; DME diabetic macular edema; dbh dot blot hemorrhages; CWS cotton wool spot; POAG primary open angle glaucoma; C/D cup-to-disc ratio; HVF humphrey visual field; GVF goldmann visual field; OCT optical coherence tomography; IOP intraocular pressure; BRVO Branch retinal vein occlusion; CRVO central retinal vein occlusion; CRAO central retinal artery occlusion; BRAO branch retinal artery occlusion; RT retinal tear; SB scleral buckle; PPV pars plana vitrectomy; VH Vitreous hemorrhage; PRP panretinal laser photocoagulation; IVK intravitreal kenalog; VMT vitreomacular traction; MH Macular hole;  NVD neovascularization of the disc; NVE neovascularization elsewhere; AREDS age related eye disease study; ARMD age related macular degeneration; POAG primary open angle glaucoma; EBMD epithelial/anterior basement membrane dystrophy; ACIOL anterior chamber intraocular lens; IOL intraocular lens; PCIOL posterior chamber intraocular lens; Phaco/IOL phacoemulsification with intraocular lens placement; [place] photorefractive keratectomy; LASIK laser assisted in situ keratomileusis; HTN hypertension; DM diabetes mellitus; COPD chronic obstructive pulmonary disease

## 2021-08-07 ENCOUNTER — Other Ambulatory Visit: Payer: Self-pay

## 2021-08-07 ENCOUNTER — Encounter (INDEPENDENT_AMBULATORY_CARE_PROVIDER_SITE_OTHER): Payer: Self-pay | Admitting: Ophthalmology

## 2021-08-07 ENCOUNTER — Ambulatory Visit (INDEPENDENT_AMBULATORY_CARE_PROVIDER_SITE_OTHER): Payer: PPO | Admitting: Ophthalmology

## 2021-08-07 DIAGNOSIS — H25813 Combined forms of age-related cataract, bilateral: Secondary | ICD-10-CM

## 2021-08-07 DIAGNOSIS — I10 Essential (primary) hypertension: Secondary | ICD-10-CM | POA: Diagnosis not present

## 2021-08-07 DIAGNOSIS — H35033 Hypertensive retinopathy, bilateral: Secondary | ICD-10-CM | POA: Diagnosis not present

## 2021-08-07 DIAGNOSIS — E113313 Type 2 diabetes mellitus with moderate nonproliferative diabetic retinopathy with macular edema, bilateral: Secondary | ICD-10-CM | POA: Diagnosis not present

## 2021-08-07 DIAGNOSIS — H35012 Changes in retinal vascular appearance, left eye: Secondary | ICD-10-CM | POA: Diagnosis not present

## 2021-08-07 MED ORDER — BEVACIZUMAB CHEMO INJECTION 1.25MG/0.05ML SYRINGE FOR KALEIDOSCOPE
1.2500 mg | INTRAVITREAL | Status: AC | PRN
  Administered 2021-08-07: 1.25 mg via INTRAVITREAL

## 2021-08-28 DIAGNOSIS — N1832 Chronic kidney disease, stage 3b: Secondary | ICD-10-CM | POA: Diagnosis not present

## 2021-09-04 DIAGNOSIS — N1832 Chronic kidney disease, stage 3b: Secondary | ICD-10-CM | POA: Diagnosis not present

## 2021-09-04 DIAGNOSIS — E785 Hyperlipidemia, unspecified: Secondary | ICD-10-CM | POA: Diagnosis not present

## 2021-09-04 DIAGNOSIS — E1140 Type 2 diabetes mellitus with diabetic neuropathy, unspecified: Secondary | ICD-10-CM | POA: Diagnosis not present

## 2021-09-04 DIAGNOSIS — I129 Hypertensive chronic kidney disease with stage 1 through stage 4 chronic kidney disease, or unspecified chronic kidney disease: Secondary | ICD-10-CM | POA: Diagnosis not present

## 2021-09-04 DIAGNOSIS — E1122 Type 2 diabetes mellitus with diabetic chronic kidney disease: Secondary | ICD-10-CM | POA: Diagnosis not present

## 2021-09-04 NOTE — Progress Notes (Incomplete)
[facility] Note  09/08/2021    CHIEF COMPLAINT Patient presents for No chief complaint on file.    HISTORY OF PRESENT ILLNESS: Chad [name] is a 76 y.o. male who presents to the clinic today for:    Pt states not much change in vision, writing on the TV seems to be fuzzy  Referring physician: Seward Carol, MD [place],   [id number]  HISTORICAL INFORMATION:   Selected notes from the MEDICAL RECORD NUMBER Referred by Dr. Delman Cheadle for diabetic retinopathy with DME OU [name]:  Ocular Hx- PMH-    CURRENT MEDICATIONS: No current outpatient medications on file. (Ophthalmic Drugs)   No current facility-administered medications for this visit. (Ophthalmic Drugs)   Current Outpatient Medications (Other)  Medication Sig   B-D ULTRAFINE III SHORT PEN 31G X 8 MM MISC SMARTSIG:1 Each SUB-Q Daily   Calcium Carb-Cholecalciferol (CALCIUM 500 + D) 500-125 MG-UNIT TABS Take 1 tablet by mouth daily.   gabapentin (NEURONTIN) 300 MG capsule TAKE ONE CAPSULE BY MOUTH EVERY MORNING, ONE CAPSULE DAILY IN THE AFTERNOON AND ONE CAPSULE EVERY NIGHT AT BEDTIME   insulin glargine (LANTUS) 100 UNIT/ML injection Inject 10 Units into the skin daily.   JARDIANCE 10 MG TABS tablet Take 10 mg by mouth daily.   levothyroxine (SYNTHROID, LEVOTHROID) 75 MCG tablet Take 75 mcg by mouth daily before breakfast.    losartan (COZAAR) 25 MG tablet Take 25 mg by mouth daily. bid   metoprolol succinate (TOPROL-XL) 25 MG 24 hr tablet TAKE ONE TABLET BY MOUTH DAILY   nortriptyline (PAMELOR) 10 MG capsule TAKE THREE CAPSULES BY MOUTH EVERY NIGHT AT BEDTIME   ONETOUCH VERIO test strip    rosuvastatin (CRESTOR) 40 MG tablet Take 1 tablet by mouth daily.   No current facility-administered medications for this visit. (Other)   REVIEW OF SYSTEMS:   ALLERGIES No Known Allergies  PAST MEDICAL HISTORY Past Medical History:  Diagnosis Date   Cataract    CKD  (chronic kidney disease)    Colon polyps    Diabetes ([place])    Diabetic polyneuropathy associated with type 2 diabetes mellitus ([place]) 01/15/2017   Diabetic retinopathy (HCC)    GERD (gastroesophageal reflux disease)    Hypercholesterolemia    Hypertension    Hypertensive retinopathy    Hypothyroidism    Polyneuropathy    Proteinuria    No past surgical history on file.  FAMILY HISTORY Family History  Problem Relation Age of Onset   Stroke Father    Glaucoma Father    SOCIAL HISTORY Social History   Tobacco Use   Smoking status: Former   Smokeless tobacco: Never  Scientific laboratory technician Use: Never used  Substance Use Topics   Alcohol use: Yes   Drug use: No       OPHTHALMIC EXAM: Not recorded    IMAGING AND PROCEDURES  Imaging and Procedures for 09/08/2021          ASSESSMENT/PLAN:    ICD-10-CM   1. Moderate nonproliferative diabetic retinopathy of both eyes with macular edema associated with type 2 diabetes mellitus ([place])  V76.1607     2. 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 Essential hypertension  I10     4. Hypertensive retinopathy of both eyes  H35.033     5. Combined forms of age-related cataract of both eyes  H25.813       1. Moderate  Non-proliferative diabetic retinopathy w/ DME OU  - s/p IVA OS #1 (11.14.22)  - s/p IVA OD #1 (11.17.22)  - s/p IVA OU #2 (12.15.22), #3 (01.12.23) - exam shows scattered MA/DBH OU, +edema and exudates - FA (11.14.22) shows vascular perfusion defects OU, no NV OU, OS with focal macroaneurysm along IT arcades - OCT shows OD: Mild interval increase in IRF/IRHM; OS: persistent IRF/IRHM -- slightly increased temporal macula, Focal IRHM and retinal thickening along IT arcades -- macroaneurysm -- slightly improved - recommend IVA OU #4 today, 02.13.23 - pt wishes to proceed with injection - RBA of procedure discussed, questions answered - IVA informed consent obtained and signed OU, 11.14.22 - see procedure  note - f/u in 4 weeks, DFE, OCT, possible injection(s)  2. Retinal macroaneurysm OS  - focal lesion along IT arcades  - s/p IVA OS #1, #2, and #3 as above  - OCT shows mild interval improvement  - recommend IVA OS #4 as above  3,4. Hypertensive retinopathy OU - discussed importance of tight BP control - monitor  5.  Mixed Cataract OU - The symptoms of cataract, surgical options, and treatments and risks were discussed with patient. - discussed diagnosis and progression - approaching visual significance   Ophthalmic Meds Ordered this visit:  No orders of the defined types were placed in this encounter.     No follow-ups on file.  There are no Patient Instructions on file for this visit.   Explained the diagnoses, plan, and follow up with the patient and they expressed understanding.  Patient expressed understanding of the importance of proper follow up care.   This document serves as a record of services personally performed by Gardiner Sleeper, MD, PhD. It was created on their behalf by Roselee Nova, COMT. The creation of this record is the provider's dictation and/or activities during the visit.  Electronically signed by: Roselee Nova, COM[place] 09/04/21 3:40 PM     Gardiner Sleeper, M.D., Ph.D. Diseases & Surgery of the Retina and Vitreous [facility]     Abbreviations: M myopia (nearsighted); A astigmatism; H hyperopia (farsighted); P presbyopia; Mrx spectacle prescription;  CTL contact lenses; OD right eye; OS left eye; OU both eyes  XT exotropia; ET esotropia; PEK punctate epithelial keratitis; PEE punctate epithelial erosions; DES dry eye syndrome; MGD meibomian gland dysfunction; ATs artificial tears; PFAT's preservative free artificial tears; [place] nuclear sclerotic cataract; PSC posterior subcapsular cataract; ERM epi-retinal membrane; PVD posterior vitreous detachment; RD retinal detachment; DM diabetes mellitus; DR diabetic retinopathy; NPDR  non-proliferative diabetic retinopathy; PDR proliferative diabetic retinopathy; CSME clinically significant macular edema; DME diabetic macular edema; dbh dot blot hemorrhages; CWS cotton wool spot; POAG primary open angle glaucoma; C/D cup-to-disc ratio; HVF humphrey visual field; GVF goldmann visual field; OCT optical coherence tomography; IOP intraocular pressure; BRVO Branch retinal vein occlusion; CRVO central retinal vein occlusion; CRAO central retinal artery occlusion; BRAO branch retinal artery occlusion; RT retinal tear; SB scleral buckle; PPV pars plana vitrectomy; VH Vitreous hemorrhage; PRP panretinal laser photocoagulation; IVK intravitreal kenalog; VMT vitreomacular traction; MH Macular hole;  NVD neovascularization of the disc; NVE neovascularization elsewhere; AREDS age related eye disease study; ARMD age related macular degeneration; POAG primary open angle glaucoma; EBMD epithelial/anterior basement membrane dystrophy; ACIOL anterior chamber intraocular lens; IOL intraocular lens; PCIOL posterior chamber intraocular lens; Phaco/IOL phacoemulsification with intraocular lens placement; [place] photorefractive keratectomy; LASIK laser assisted in situ keratomileusis; HTN hypertension; DM diabetes mellitus; COPD chronic obstructive pulmonary disease

## 2021-09-05 ENCOUNTER — Encounter (INDEPENDENT_AMBULATORY_CARE_PROVIDER_SITE_OTHER): Payer: PPO | Admitting: Ophthalmology

## 2021-09-05 DIAGNOSIS — E113313 Type 2 diabetes mellitus with moderate nonproliferative diabetic retinopathy with macular edema, bilateral: Secondary | ICD-10-CM | POA: Diagnosis not present

## 2021-09-05 DIAGNOSIS — E1122 Type 2 diabetes mellitus with diabetic chronic kidney disease: Secondary | ICD-10-CM | POA: Diagnosis not present

## 2021-09-05 DIAGNOSIS — E11319 Type 2 diabetes mellitus with unspecified diabetic retinopathy without macular edema: Secondary | ICD-10-CM | POA: Diagnosis not present

## 2021-09-05 DIAGNOSIS — N184 Chronic kidney disease, stage 4 (severe): Secondary | ICD-10-CM | POA: Diagnosis not present

## 2021-09-05 DIAGNOSIS — I10 Essential (primary) hypertension: Secondary | ICD-10-CM | POA: Diagnosis not present

## 2021-09-05 DIAGNOSIS — E0840 Diabetes mellitus due to underlying condition with diabetic neuropathy, unspecified: Secondary | ICD-10-CM | POA: Diagnosis not present

## 2021-09-05 DIAGNOSIS — I739 Peripheral vascular disease, unspecified: Secondary | ICD-10-CM | POA: Diagnosis not present

## 2021-09-05 DIAGNOSIS — I350 Nonrheumatic aortic (valve) stenosis: Secondary | ICD-10-CM | POA: Diagnosis not present

## 2021-09-05 DIAGNOSIS — E1165 Type 2 diabetes mellitus with hyperglycemia: Secondary | ICD-10-CM | POA: Diagnosis not present

## 2021-09-05 DIAGNOSIS — E039 Hypothyroidism, unspecified: Secondary | ICD-10-CM | POA: Diagnosis not present

## 2021-09-05 DIAGNOSIS — Z0000 Encounter for general adult medical examination without abnormal findings: Secondary | ICD-10-CM | POA: Diagnosis not present

## 2021-09-08 ENCOUNTER — Other Ambulatory Visit: Payer: Self-pay

## 2021-09-08 ENCOUNTER — Ambulatory Visit (INDEPENDENT_AMBULATORY_CARE_PROVIDER_SITE_OTHER): Payer: PPO | Admitting: Ophthalmology

## 2021-09-08 ENCOUNTER — Encounter (INDEPENDENT_AMBULATORY_CARE_PROVIDER_SITE_OTHER): Payer: Self-pay | Admitting: Ophthalmology

## 2021-09-08 DIAGNOSIS — I10 Essential (primary) hypertension: Secondary | ICD-10-CM

## 2021-09-08 DIAGNOSIS — H35033 Hypertensive retinopathy, bilateral: Secondary | ICD-10-CM

## 2021-09-08 DIAGNOSIS — H35012 Changes in retinal vascular appearance, left eye: Secondary | ICD-10-CM | POA: Diagnosis not present

## 2021-09-08 DIAGNOSIS — H25813 Combined forms of age-related cataract, bilateral: Secondary | ICD-10-CM

## 2021-09-08 DIAGNOSIS — E113313 Type 2 diabetes mellitus with moderate nonproliferative diabetic retinopathy with macular edema, bilateral: Secondary | ICD-10-CM | POA: Diagnosis not present

## 2021-09-08 MED ORDER — BEVACIZUMAB CHEMO INJECTION 1.25MG/0.05ML SYRINGE FOR KALEIDOSCOPE
1.2500 mg | INTRAVITREAL | Status: AC | PRN
  Administered 2021-09-08: 1.25 mg via INTRAVITREAL

## 2021-09-08 NOTE — Progress Notes (Addendum)
[facility] Note  09/08/2021    CHIEF COMPLAINT Patient presents for Retina Follow Up  HISTORY OF PRESENT ILLNESS: Chad [name] is a 76 y.o. male who presents to the clinic today for:   HPI     Retina Follow Up   Patient presents with  Diabetic Retinopathy.  In both eyes.  Duration of 4 weeks.  I, the attending physician,  performed the HPI with the patient and updated documentation appropriately.        Comments   Pt states vision is about the same, no better or worse, no fol or floaters, no gtts, has incrased Losartan to 100mg , blood sugar was 150 this morning      Last edited by Bernarda Caffey, MD on 09/08/2021 11:37 PM.     Pt states he has not been able to tell any difference in vision  Referring physician: Seward Carol, MD [address],  [place] [address]  HISTORICAL INFORMATION:   Selected notes from the MEDICAL RECORD NUMBER Referred by Dr. Delman Cheadle for diabetic retinopathy with DME OU [name]:  Ocular Hx- PMH-    CURRENT MEDICATIONS: No current outpatient medications on file. (Ophthalmic Drugs)   No current facility-administered medications for this visit. (Ophthalmic Drugs)   Current Outpatient Medications (Other)  Medication Sig   B-D ULTRAFINE III SHORT PEN 31G X 8 MM MISC SMARTSIG:1 Each SUB-Q Daily   Calcium Carb-Cholecalciferol (CALCIUM 500 + D) 500-125 MG-UNIT TABS Take 1 tablet by mouth daily.   gabapentin (NEURONTIN) 300 MG capsule TAKE ONE CAPSULE BY MOUTH EVERY MORNING, ONE CAPSULE DAILY IN THE AFTERNOON AND ONE CAPSULE EVERY NIGHT AT BEDTIME   insulin glargine (LANTUS) 100 UNIT/ML injection Inject 10 Units into the skin daily.   JARDIANCE 10 MG TABS tablet Take 10 mg by mouth daily.   levothyroxine (SYNTHROID, LEVOTHROID) 75 MCG tablet Take 75 mcg by mouth daily before breakfast.    losartan (COZAAR) 25 MG tablet Take 25 mg by mouth daily. bid   metoprolol succinate (TOPROL-XL) 25 MG 24 hr tablet  TAKE ONE TABLET BY MOUTH DAILY   nortriptyline (PAMELOR) 10 MG capsule TAKE THREE CAPSULES BY MOUTH EVERY NIGHT AT BEDTIME   ONETOUCH VERIO test strip    rosuvastatin (CRESTOR) 40 MG tablet Take 1 tablet by mouth daily.   No current facility-administered medications for this visit. (Other)   REVIEW OF SYSTEMS: ROS   Positive for: Endocrine, Cardiovascular, Eyes Negative for: Constitutional, Gastrointestinal, Neurological, Skin, Genitourinary, Musculoskeletal, HENT, Respiratory, Psychiatric, Allergic/Imm, Heme/Lymph Last edited by Debbrah Alar, COT on 09/08/2021  1:16 PM.     ALLERGIES No Known Allergies  PAST MEDICAL HISTORY Past Medical History:  Diagnosis Date   Cataract    CKD (chronic kidney disease)    Colon polyps    Diabetes ([place])    Diabetic polyneuropathy associated with type 2 diabetes mellitus ([place]) 01/15/2017   Diabetic retinopathy ([place])    GERD (gastroesophageal reflux disease)    Hypercholesterolemia    Hypertension    Hypertensive retinopathy    Hypothyroidism    Polyneuropathy    Proteinuria    History reviewed. No pertinent surgical history.  FAMILY HISTORY Family History  Problem Relation Age of Onset   Stroke Father    Glaucoma Father    SOCIAL HISTORY Social History   Tobacco Use   Smoking status: Former   Smokeless tobacco: Never  Scientific laboratory technician Use: Never used  Substance  Use Topics   Alcohol use: Yes   Drug use: No       OPHTHALMIC EXAM: Base Eye Exam     Visual Acuity (Snellen - Linear)       Right Left   Dist [place] 20/40 -1 20/40 -2   Dist ph [place] 20/25 -1 20/25 -2         Tonometry (Tonopen, 1:25 PM)       Right Left   Pressure 16 16         Pupils       Dark Light Shape React APD   Right 4 2 Round Brisk None   Left 4 2 Round Brisk None         Visual Fields (Counting fingers)       Left Right    Full Full         Extraocular Movement       Right Left    Full, Ortho Full, Ortho          Neuro/Psych     Oriented x3: Yes   Mood/Affect: Normal         Dilation     Both eyes: 1.0% Mydriacyl, 2.5% Phenylephrine @ 1:25 PM           Slit Lamp and Fundus Exam     External Exam       Right Left   External Normal Normal         Slit Lamp Exam       Right Left   Lids/Lashes Dermatochalasis - upper lid, mild Telangiectasia Dermatochalasis - upper lid, Meibomian gland dysfunction   Conjunctiva/Sclera White and quiet White and quiet   Cornea 2+ fine, inferior Punctate epithelial erosions 1+ Punctate epithelial erosions, mild tear film debris   Anterior Chamber deep, clear, narrow angles deep, clear, narrow angles   Iris Round and dilated, mild anterior bowing, No NVI Round and dilated, mild anterior bowing, No NVI   Lens 2-3+ Nuclear sclerosis, 2+ Cortical cataract 2-3+ Nuclear sclerosis, 2+ Cortical cataract   Anterior Vitreous Vitreous syneresis Vitreous syneresis         Fundus Exam       Right Left   Disc Pink and Sharp, no NVD Pink and Sharp, no NVD   C/D Ratio 0.3 0.3   Macula Blunted foveal reflex, mild ERM, scattered MA and exudate, - improved central cystic changes / edema - improved Flat, good foveal reflex, scattered MA and focal exudate, scattered cystic changes -- improving   Vessels attenuated, Tortuous attenuated, Tortuous, Copper wiring, Macroaneurysm / CWS along IT arcades - improving   Periphery Attached, scattered MA greatest posteriorly, focal CWS nasal to disc Attached, scattered MA/DBH greatest posteriorly           IMAGING AND PROCEDURES  Imaging and Procedures for 09/08/2021  OCT, Retina - OU - Both Eyes       Right Eye Quality was good. Central Foveal Thickness: 420. Progression has improved. Findings include abnormal foveal contour, intraretinal fluid, no SRF, intraretinal hyper-reflective material, macular pucker, epiretinal membrane (interval improvement in central IRF/IRHM).   Left Eye Quality was good. Central Foveal  Thickness: 329. Progression has improved. Findings include intraretinal fluid, no SRF, intraretinal hyper-reflective material, abnormal foveal contour (persistent IRF/IRHM -- slightly improved temporal macula, focal increase in central cyst, Focal IRHM and retinal thickening along IT arcades -- macroaneurysm -- slightly improved).   Notes *Images captured and stored on drive  Diagnosis / Impression:  +DME  OU OD: interval improvement in central IRF/IRHM OS: persistent IRF/IRHM -- slightly improved temporal macula, focal increase in central cyst, Focal IRHM and retinal thickening along IT arcades -- macroaneurysm -- slightly improved  Clinical management:  See below  Abbreviations: NFP - Normal foveal profile. CME - cystoid macular edema. PED - pigment epithelial detachment. IRF - intraretinal fluid. SRF - subretinal fluid. EZ - ellipsoid zone. ERM - epiretinal membrane. ORA - outer retinal atrophy. ORT - outer retinal tubulation. SRHM - subretinal hyper-reflective material. IRHM - intraretinal hyper-reflective material      Intravitreal Injection, Pharmacologic Agent - OD - Right Eye       Time Out 09/08/2021. 1:41 PM. Confirmed correct patient, procedure, site, and patient consented.   Anesthesia Topical anesthesia was used. Anesthetic medications included Proparacaine 0.5%, Lidocaine 2%.   Procedure Preparation included 5% betadine to ocular surface, eyelid speculum. A supplied needle was used.   Injection: 1.25 mg Bevacizumab 1.25mg /0.56ml   Route: Intravitreal, Site: Right Eye   NDC: H061816, Lot: [id number] , Expiration date: 10/08/2021   Post-op Post injection exam found visual acuity of at least counting fingers. The patient tolerated the procedure well. There were no complications. The patient received written and verbal post procedure care education. Post injection medications were not given.      Intravitreal Injection, Pharmacologic Agent - OS - Left Eye        Time Out 09/08/2021. 1:41 PM. Confirmed correct patient, procedure, site, and patient consented.   Anesthesia Topical anesthesia was used. Anesthetic medications included Proparacaine 0.5%, Lidocaine 2%.   Procedure Preparation included 5% betadine to ocular surface, eyelid speculum. A (32g) needle was used.   Injection: 1.25 mg Bevacizumab 1.25mg /0.93ml   Route: Intravitreal, Site: Left Eye   NDC: 80m, Lot: [id number], Expiration date: 10/06/2021   Post-op Post injection exam found visual acuity of at least counting fingers. The patient tolerated the procedure well. There were no complications. The patient received written and verbal post procedure care education. Post injection medications were not given.            ASSESSMENT/PLAN:    ICD-10-CM   1. Moderate nonproliferative diabetic retinopathy of both eyes with macular edema associated with type 2 diabetes mellitus (HCC)  E11.3313 OCT, Retina - OU - Both Eyes    Intravitreal Injection, Pharmacologic Agent - OD - Right Eye    Intravitreal Injection, Pharmacologic Agent - OS - Left Eye    Bevacizumab (AVASTIN) SOLN 1.25 mg    Bevacizumab (AVASTIN) SOLN 1.25 mg    2. 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 Essential hypertension  I10     4. Hypertensive retinopathy of both eyes  H35.033     5. Combined forms of age-related cataract of both eyes  H25.813      1. Moderate Non-proliferative diabetic retinopathy w/ DME OU  - s/p IVA OS #1 (11.14.22)  - s/p IVA OD #1 (11.17.22)  - s/p IVA OU #2 (12.15.22), #3 (01.12.23) - exam shows scattered MA/DBH OU, +edema and exudates - FA (11.14.22) shows vascular perfusion defects OU, no NV OU, OS with focal macroaneurysm along IT arcades - OCT shows OD: interval improvement in central IRF/IRHM OS: persistent IRF/IRHM -- slightly improved temporal macula, focal increase in central cyst, Focal IRHM and retinal thickening along IT arcades -- macroaneurysm -- slightly  improved  - BCVA improved to 20/25 OU - recommend IVA OU #4 today, 02.13.23 - pt wishes to proceed with injection -  RBA of procedure discussed, questions answered - IVA informed consent obtained and signed OU, 11.14.22 - see procedure note - f/u in 4 weeks, DFE, OCT, possible injection(s)  2. Retinal macroaneurysm OS  - focal lesion along IT arcades  - s/p IVA OS #1, #2, and #3 as above  - OCT shows mild interval improvement  - recommend IVA OS #4 as above  3,4. Hypertensive retinopathy OU - discussed importance of tight BP control - monitor  5.  Mixed Cataract OU - The symptoms of cataract, surgical options, and treatments and risks were discussed with patient. - discussed diagnosis and progression - approaching visual significance   Ophthalmic Meds Ordered this visit:  Meds ordered this encounter  Medications   Bevacizumab (AVASTIN) SOLN 1.25 mg   Bevacizumab (AVASTIN) SOLN 1.25 mg      Return in about 4 weeks (around 10/06/2021) for f/u NPDR OU, DFE, OCT.  There are no Patient Instructions on file for this visit.   Explained the diagnoses, plan, and follow up with the patient and they expressed understanding.  Patient expressed understanding of the importance of proper follow up care.   This document serves as a record of services personally performed by Gardiner Sleeper, MD, PhD. It was created on their behalf by Roselee Nova, COMT. The creation of this record is the provider's dictation and/or activities during the visit.  Electronically signed by: Roselee Nova, COMT 09/08/21 11:39 PM  Gardiner Sleeper, M.D., Ph.D. Diseases & Surgery of the Retina and Vitreous [facility]  I have reviewed the above documentation for accuracy and completeness, and I agree with the above. Gardiner Sleeper, M.D., Ph.D. 09/08/21 11:39 PM   Abbreviations: M myopia (nearsighted); A astigmatism; H hyperopia (farsighted); P presbyopia; Mrx spectacle prescription;   CTL contact lenses; OD right eye; OS left eye; OU both eyes  XT exotropia; ET esotropia; PEK punctate epithelial keratitis; PEE punctate epithelial erosions; DES dry eye syndrome; MGD meibomian gland dysfunction; ATs artificial tears; PFAT's preservative free artificial tears; [place] nuclear sclerotic cataract; PSC posterior subcapsular cataract; ERM epi-retinal membrane; PVD posterior vitreous detachment; RD retinal detachment; DM diabetes mellitus; DR diabetic retinopathy; NPDR non-proliferative diabetic retinopathy; PDR proliferative diabetic retinopathy; CSME clinically significant macular edema; DME diabetic macular edema; dbh dot blot hemorrhages; CWS cotton wool spot; POAG primary open angle glaucoma; C/D cup-to-disc ratio; HVF humphrey visual field; GVF goldmann visual field; OCT optical coherence tomography; IOP intraocular pressure; BRVO Branch retinal vein occlusion; CRVO central retinal vein occlusion; CRAO central retinal artery occlusion; BRAO branch retinal artery occlusion; RT retinal tear; SB scleral buckle; PPV pars plana vitrectomy; VH Vitreous hemorrhage; PRP panretinal laser photocoagulation; IVK intravitreal kenalog; VMT vitreomacular traction; MH Macular hole;  NVD neovascularization of the disc; NVE neovascularization elsewhere; AREDS age related eye disease study; ARMD age related macular degeneration; POAG primary open angle glaucoma; EBMD epithelial/anterior basement membrane dystrophy; ACIOL anterior chamber intraocular lens; IOL intraocular lens; PCIOL posterior chamber intraocular lens; Phaco/IOL phacoemulsification with intraocular lens placement; [place] photorefractive keratectomy; LASIK laser assisted in situ keratomileusis; HTN hypertension; DM diabetes mellitus; COPD chronic obstructive pulmonary disease

## 2021-09-18 DIAGNOSIS — N1832 Chronic kidney disease, stage 3b: Secondary | ICD-10-CM | POA: Diagnosis not present

## 2021-09-29 DIAGNOSIS — H6123 Impacted cerumen, bilateral: Secondary | ICD-10-CM | POA: Diagnosis not present

## 2021-10-02 NOTE — Progress Notes (Shared)
?[facility] Note ? ?10/06/2021 ?  ? ?CHIEF COMPLAINT ?Patient presents for No chief complaint on file. ? ?HISTORY OF PRESENT ILLNESS: ?Chad [name] is a 76 y.o. male who presents to the clinic today for:  ? ? ? ?Pt states he has not been able to tell any difference in vision ? ?Referring physician: ?Seward Carol, MD ?[address] ?[address] ?[place],  [place] [address] ? ?HISTORICAL INFORMATION:  ? ?Selected notes from the [facility] ?Referred by Dr. Delman Cheadle for diabetic retinopathy with DME OU ?[name]:  ?Ocular Hx- ?PMH- ?  ? ?CURRENT MEDICATIONS: ?No current outpatient medications on file. (Ophthalmic Drugs)  ? ?No current facility-administered medications for this visit. (Ophthalmic Drugs)  ? ?Current Outpatient Medications (Other)  ?Medication Sig  ? B-D ULTRAFINE III SHORT PEN 31G X 8 MM MISC SMARTSIG:1 Each SUB-Q Daily  ? Calcium Carb-Cholecalciferol (CALCIUM 500 + D) 500-125 MG-UNIT TABS Take 1 tablet by mouth daily.  ? gabapentin (NEURONTIN) 300 MG capsule TAKE ONE CAPSULE BY MOUTH EVERY MORNING, ONE CAPSULE DAILY IN THE AFTERNOON AND ONE CAPSULE EVERY NIGHT AT BEDTIME  ? insulin glargine (LANTUS) 100 UNIT/ML injection Inject 10 Units into the skin daily.  ? JARDIANCE 10 MG TABS tablet Take 10 mg by mouth daily.  ? levothyroxine (SYNTHROID, LEVOTHROID) 75 MCG tablet Take 75 mcg by mouth daily before breakfast.   ? losartan (COZAAR) 25 MG tablet Take 25 mg by mouth daily. bid  ? metoprolol succinate (TOPROL-XL) 25 MG 24 hr tablet TAKE ONE TABLET BY MOUTH DAILY  ? nortriptyline (PAMELOR) 10 MG capsule TAKE THREE CAPSULES BY MOUTH EVERY NIGHT AT BEDTIME  ? ONETOUCH VERIO test strip   ? rosuvastatin (CRESTOR) 40 MG tablet Take 1 tablet by mouth daily.  ? ?No current facility-administered medications for this visit. (Other)  ? ?REVIEW OF SYSTEMS: ? ? ?ALLERGIES ?No Known Allergies ? ?PAST MEDICAL HISTORY ?Past Medical History:  ?Diagnosis Date  ? Cataract   ? CKD (chronic  kidney disease)   ? Colon polyps   ? Diabetes ([place])   ? Diabetic polyneuropathy associated with type 2 diabetes mellitus ([place]) 01/15/2017  ? Diabetic retinopathy ([place])   ? GERD (gastroesophageal reflux disease)   ? Hypercholesterolemia   ? Hypertension   ? Hypertensive retinopathy   ? Hypothyroidism   ? Polyneuropathy   ? Proteinuria   ? ?No past surgical history on file. ? ?FAMILY HISTORY ?Family History  ?Problem Relation Age of Onset  ? Stroke Father   ? Glaucoma Father   ? ?SOCIAL HISTORY ?Social History  ? ?Tobacco Use  ? Smoking status: Former  ? Smokeless tobacco: Never  ?Vaping Use  ? Vaping Use: Never used  ?Substance Use Topics  ? Alcohol use: Yes  ? Drug use: No  ?  ? ?  ?OPHTHALMIC EXAM: ?Not recorded ?  ? ?IMAGING AND PROCEDURES  ?Imaging and Procedures for 10/06/2021 ? ? ?  ?  ? ?  ?ASSESSMENT/PLAN: ? ?  ICD-10-CM   ?1. Moderate nonproliferative diabetic retinopathy of both eyes with macular edema associated with type 2 diabetes mellitus ([place])  C78.9381   ?  ?2. Retinal macroaneurysm of left eye  H35.012   ?  ?3. Essential hypertension  I10   ?  ?4. Hypertensive retinopathy of both eyes  H35.033   ?  ?5. Combined forms of age-related cataract of both eyes  H25.813   ?  ? ?1. Moderate Non-proliferative diabetic retinopathy w/  DME OU ? - s/p IVA OS #1 (11.14.22) ? - s/p IVA OD #1 (11.17.22) ? - s/p IVA OU #2 (12.15.22), #3 (01.12.23), #4 (02.13.23) ?- exam shows scattered MA/DBH OU, +edema and exudates ?- FA (11.14.22) shows vascular perfusion defects OU, no NV OU, OS with focal macroaneurysm along IT arcades ?- OCT shows OD: interval improvement in central IRF/IRHM ?OS: persistent IRF/IRHM -- slightly improved temporal macula, focal increase in central cyst, Focal IRHM and retinal thickening along IT arcades -- macroaneurysm -- slightly improved ? - BCVA improved to 20/25 OU ?- recommend IVA OU #5 today, 03.13.23 ?- pt wishes to proceed with injection ?- RBA of procedure discussed, questions answered ?-  IVA informed consent obtained and signed OU, 11.14.22 ?- see procedure note ?- f/u in 4 weeks, DFE, OCT, possible injection(s) ? ?2. Retinal macroaneurysm OS ? - focal lesion along IT arcades ? - s/p IVA OS #1, #2, and #3 as above ? - OCT shows mild interval improvement ? - recommend IVA OS #4 as above ? ?3,4. Hypertensive retinopathy OU ?- discussed importance of tight BP control ?- monitor ? ?5.  Mixed Cataract OU ?- The symptoms of cataract, surgical options, and treatments and risks were discussed with patient. ?- discussed diagnosis and progression ?- approaching visual significance ? ? ?Ophthalmic Meds Ordered this visit:  ?No orders of the defined types were placed in this encounter. ? ?  ? ?No follow-ups on file. ? ?There are no Patient Instructions on file for this visit. ? ? ?Explained the diagnoses, plan, and follow up with the patient and they expressed understanding.  Patient expressed understanding of the importance of proper follow up care.  ? ?This document serves as a record of services personally performed by Gardiner Sleeper, MD, PhD. It was created on their behalf by San Jetty. Owens Shark, OA an ophthalmic technician. The creation of this record is the provider's dictation and/or activities during the visit.   ? ?Electronically signed by: San Jetty. Owens Shark, OA 03.09.2023 9:00 AM ? ? ?Gardiner Sleeper, M.D., Ph.D. ?Diseases & Surgery of the Retina and Vitreous ?[place] ? ? ? ? ?Abbreviations: ?M myopia (nearsighted); A astigmatism; H hyperopia (farsighted); P presbyopia; Mrx spectacle prescription;  CTL contact lenses; OD right eye; OS left eye; OU both eyes  XT exotropia; ET esotropia; PEK punctate epithelial keratitis; PEE punctate epithelial erosions; DES dry eye syndrome; MGD meibomian gland dysfunction; ATs artificial tears; PFAT's preservative free artificial tears; [place] nuclear sclerotic cataract; PSC posterior subcapsular cataract; ERM epi-retinal membrane; PVD posterior  vitreous detachment; RD retinal detachment; DM diabetes mellitus; DR diabetic retinopathy; NPDR non-proliferative diabetic retinopathy; PDR proliferative diabetic retinopathy; CSME clinically significant macular edema; DME diabetic macular edema; dbh dot blot hemorrhages; CWS cotton wool spot; POAG primary open angle glaucoma; C/D cup-to-disc ratio; HVF humphrey visual field; GVF goldmann visual field; OCT optical coherence tomography; IOP intraocular pressure; BRVO Branch retinal vein occlusion; CRVO central retinal vein occlusion; CRAO central retinal artery occlusion; BRAO branch retinal artery occlusion; RT retinal tear; SB scleral buckle; PPV pars plana vitrectomy; VH Vitreous hemorrhage; PRP panretinal laser photocoagulation; IVK intravitreal kenalog; VMT vitreomacular traction; MH Macular hole;  NVD neovascularization of the disc; NVE neovascularization elsewhere; AREDS age related eye disease study; ARMD age related macular degeneration; POAG primary open angle glaucoma; EBMD epithelial/anterior basement membrane dystrophy; ACIOL anterior chamber intraocular lens; IOL intraocular lens; PCIOL posterior chamber intraocular lens; Phaco/IOL phacoemulsification with intraocular lens placement; PRK photorefractive keratectomy; LASIK  laser assisted in situ keratomileusis; HTN hypertension; DM diabetes mellitus; COPD chronic obstructive pulmonary disease ? ?

## 2021-10-06 ENCOUNTER — Encounter (INDEPENDENT_AMBULATORY_CARE_PROVIDER_SITE_OTHER): Payer: PPO | Admitting: Ophthalmology

## 2021-10-08 NOTE — Progress Notes (Signed)
?[facility] Note ? ?10/09/2021 ?  ? ?CHIEF COMPLAINT ?Patient presents for Retina Follow Up ? ?HISTORY OF PRESENT ILLNESS: ?Chad [name] is a 76 y.o. male who presents to the clinic today for:  ? ?HPI   ? ? Retina Follow Up   ?Patient presents with  Diabetic Retinopathy.  In both eyes.  Duration of 4 weeks.  Since onset it is stable.  I, the attending physician,  performed the HPI with the patient and updated documentation appropriately. ? ?  ?  ? ? Comments   ?4 week follow up NPDR OU- No changes in vision since last visit.   ?BS 130's  A1C 5.7 ? ?  ?  ?Last edited by Bernarda Caffey, MD on 10/09/2021  5:48 PM.  ?  ?Pt states he has not been able to tell any difference in vision ? ?Referring physician: ?Seward Carol, MD ?[address] ?[address] ?[place],  [place] [address] ? ?HISTORICAL INFORMATION:  ? ?Selected notes from the [place] ?Referred by Dr. Delman Cheadle for diabetic retinopathy with DME OU ?[name]:  ?Ocular Hx- ?PMH- ?  ? ?CURRENT MEDICATIONS: ?No current outpatient medications on file. (Ophthalmic Drugs)  ? ?No current facility-administered medications for this visit. (Ophthalmic Drugs)  ? ?Current Outpatient Medications (Other)  ?Medication Sig  ? Calcium Carb-Cholecalciferol (CALCIUM 500 + D) 500-125 MG-UNIT TABS Take 1 tablet by mouth daily.  ? gabapentin (NEURONTIN) 300 MG capsule TAKE ONE CAPSULE BY MOUTH EVERY MORNING, ONE CAPSULE DAILY IN THE AFTERNOON AND ONE CAPSULE EVERY NIGHT AT BEDTIME  ? insulin glargine (LANTUS) 100 UNIT/ML injection Inject 10 Units into the skin daily.  ? JARDIANCE 10 MG TABS tablet Take 10 mg by mouth daily.  ? levothyroxine (SYNTHROID, LEVOTHROID) 75 MCG tablet Take 75 mcg by mouth daily before breakfast.   ? losartan (COZAAR) 25 MG tablet Take 25 mg by mouth daily. bid  ? metoprolol succinate (TOPROL-XL) 25 MG 24 hr tablet TAKE ONE TABLET BY MOUTH DAILY  ? nortriptyline (PAMELOR) 10 MG capsule TAKE THREE CAPSULES BY MOUTH EVERY NIGHT AT  BEDTIME  ? rosuvastatin (CRESTOR) 40 MG tablet Take 1 tablet by mouth daily.  ? B-D ULTRAFINE III SHORT PEN 31G X 8 MM MISC SMARTSIG:1 Each SUB-Q Daily  ? ONETOUCH VERIO test strip   ? ?No current facility-administered medications for this visit. (Other)  ? ?REVIEW OF SYSTEMS: ?ROS   ?Positive for: Endocrine, Cardiovascular, Eyes ?Negative for: Constitutional, Gastrointestinal, Neurological, Skin, Genitourinary, Musculoskeletal, HENT, Respiratory, Psychiatric, Allergic/Imm, Heme/Lymph ?Last edited by Leonie Douglas, COA on 10/09/2021  1:38 PM.  ?  ? ?ALLERGIES ?No Known Allergies ? ?PAST MEDICAL HISTORY ?Past Medical History:  ?Diagnosis Date  ? Cataract   ? CKD (chronic kidney disease)   ? Colon polyps   ? Diabetes ([place])   ? Diabetic polyneuropathy associated with type 2 diabetes mellitus ([place]) 01/15/2017  ? Diabetic retinopathy ([place])   ? GERD (gastroesophageal reflux disease)   ? Hypercholesterolemia   ? Hypertension   ? Hypertensive retinopathy   ? Hypothyroidism   ? Polyneuropathy   ? Proteinuria   ? ?History reviewed. No pertinent surgical history. ? ?FAMILY HISTORY ?Family History  ?Problem Relation Age of Onset  ? Stroke Father   ? Glaucoma Father   ? ?SOCIAL HISTORY ?Social History  ? ?Tobacco Use  ? Smoking status: Former  ? Smokeless tobacco: Never  ?Vaping Use  ? Vaping Use: Never used  ?Substance Use Topics  ?  Alcohol use: Yes  ? Drug use: No  ?  ? ?  ?OPHTHALMIC EXAM: ?Base Eye Exam   ? ? Visual Acuity (Snellen - Linear)   ? ?   Right Left  ? Dist [place] 20/50-2+1 20/40  ? Dist ph [place] 20/30+ 20/25-  ? ?  ?  ? ? Tonometry (Tonopen, 1:47 PM)   ? ?   Right Left  ? Pressure 17 16  ? ?  ?  ? ? Pupils   ? ?   Dark Light Shape React APD  ? Right 3 2 Round Brisk None  ? Left 3 2 Round Brisk None  ? ?  ?  ? ? Visual Fields (Counting fingers)   ? ?   Left Right  ?  Full Full  ? ?  ?  ? ? Extraocular Movement   ? ?   Right Left  ?  Full Full  ? ?  ?  ? ? Neuro/Psych   ? ? Oriented x3: Yes  ? Mood/Affect: Normal  ? ?  ?   ? ? Dilation   ? ? Both eyes: 1.0% Mydriacyl, 2.5% Phenylephrine @ 1:47 PM  ? ?  ?  ? ?  ? ?Slit Lamp and Fundus Exam   ? ? External Exam   ? ?   Right Left  ? External Normal Normal  ? ?  ?  ? ? Slit Lamp Exam   ? ?   Right Left  ? Lids/Lashes Dermatochalasis - upper lid, mild Telangiectasia Dermatochalasis - upper lid, Meibomian gland dysfunction  ? Conjunctiva/Sclera White and quiet White and quiet  ? Cornea 2+ fine, inferior Punctate epithelial erosions 1+ Punctate epithelial erosions, mild tear film debris  ? Anterior Chamber deep, clear, narrow angles deep, clear, narrow angles  ? Iris Round and dilated, mild anterior bowing, No NVI Round and dilated, mild anterior bowing, No NVI  ? Lens 2-3+ Nuclear sclerosis, 2+ Cortical cataract 2-3+ Nuclear sclerosis, 2+ Cortical cataract  ? Anterior Vitreous Vitreous syneresis Vitreous syneresis  ? ?  ?  ? ? Fundus Exam   ? ?   Right Left  ? Disc Pink and Sharp, no NVD Pink and Sharp, no NVD  ? C/D Ratio 0.3 0.3  ? Macula Blunted foveal reflex, mild ERM, scattered MA and exudate, persistent central cystic changes / edema, prominent focal IRH nasal to fovea Flat, good foveal reflex, scattered MA and focal exudate, scattered cystic changes -- improving  ? Vessels attenuated, Tortuous attenuated, Tortuous, Copper wiring, Macroaneurysm / CWS along IT arcades - improving  ? Periphery Attached, scattered MA greatest posteriorly, focal CWS nasal to disc Attached, scattered MA/DBH greatest posteriorly  ? ?  ?  ? ?  ? ?IMAGING AND PROCEDURES  ?Imaging and Procedures for 10/09/2021 ? ?OCT, Retina - OU - Both Eyes   ? ?   ?Right Eye ?Quality was good. Central Foveal Thickness: 392. Progression has been stable. Findings include abnormal foveal contour, intraretinal fluid, no SRF, intraretinal hyper-reflective material, macular pucker, epiretinal membrane (Persistent central IRF/IRHM).  ? ?Left Eye ?Quality was good. Central Foveal Thickness: 317. Progression has improved. Findings  include intraretinal fluid, no SRF, intraretinal hyper-reflective material, abnormal foveal contour (Mild interval improvement in IRF/IRHM, Focal IRHM and retinal thickening along IT arcades -- macroaneurysm -- slightly improved).  ? ?Notes ?*Images captured and stored on drive ? ?Diagnosis / Impression:  ?+DME OU ?OD: persistent central IRF/IRHM ?OS: Mild interval improvement in IRF/IRHM, Focal IRHM and retinal  thickening along IT arcades -- macroaneurysm -- slightly improved ? ?Clinical management:  ?See below ? ?Abbreviations: NFP - Normal foveal profile. CME - cystoid macular edema. PED - pigment epithelial detachment. IRF - intraretinal fluid. SRF - subretinal fluid. EZ - ellipsoid zone. ERM - epiretinal membrane. ORA - outer retinal atrophy. ORT - outer retinal tubulation. SRHM - subretinal hyper-reflective material. IRHM - intraretinal hyper-reflective material ? ? ?  ? ?Intravitreal Injection, Pharmacologic Agent - OD - Right Eye   ? ?   ?Time Out ?10/09/2021. 2:47 PM. Confirmed correct patient, procedure, site, and patient consented.  ? ?Anesthesia ?Topical anesthesia was used. Anesthetic medications included Proparacaine 0.5%, Lidocaine 2%.  ? ?Procedure ?Preparation included 5% betadine to ocular surface, eyelid speculum. A supplied needle was used.  ? ?Injection: ?1.25 mg Bevacizumab 1.'25mg'$ /0.24m ?  Route: Intravitreal, Site: Right Eye ?  NDC: 593810-175-10 Lot: [id number]'$ , Expiration date: 12/03/2021  ? ?Post-op ?Post injection exam found visual acuity of at least counting fingers. The patient tolerated the procedure well. There were no complications. The patient received written and verbal post procedure care education. Post injection medications were not given.  ? ?  ? ?Intravitreal Injection, Pharmacologic Agent - OS - Left Eye   ? ?   ?Time Out ?10/09/2021. 2:47 PM. Confirmed correct patient, procedure, site, and patient consented.  ? ?Anesthesia ?Topical anesthesia was used. Anesthetic medications  included Proparacaine 0.5%, Lidocaine 2%.  ? ?Procedure ?Preparation included 5% betadine to ocular surface, eyelid speculum. A (32g) needle was used.  ? ?Injection: ?1.25 mg Bevacizumab 1.'25mg'$ /0.034m?

## 2021-10-09 ENCOUNTER — Encounter (INDEPENDENT_AMBULATORY_CARE_PROVIDER_SITE_OTHER): Payer: Self-pay | Admitting: Ophthalmology

## 2021-10-09 ENCOUNTER — Other Ambulatory Visit: Payer: Self-pay

## 2021-10-09 ENCOUNTER — Ambulatory Visit (INDEPENDENT_AMBULATORY_CARE_PROVIDER_SITE_OTHER): Payer: PPO | Admitting: Ophthalmology

## 2021-10-09 DIAGNOSIS — H35033 Hypertensive retinopathy, bilateral: Secondary | ICD-10-CM | POA: Diagnosis not present

## 2021-10-09 DIAGNOSIS — H35012 Changes in retinal vascular appearance, left eye: Secondary | ICD-10-CM

## 2021-10-09 DIAGNOSIS — I10 Essential (primary) hypertension: Secondary | ICD-10-CM | POA: Diagnosis not present

## 2021-10-09 DIAGNOSIS — H25813 Combined forms of age-related cataract, bilateral: Secondary | ICD-10-CM | POA: Diagnosis not present

## 2021-10-09 DIAGNOSIS — E113313 Type 2 diabetes mellitus with moderate nonproliferative diabetic retinopathy with macular edema, bilateral: Secondary | ICD-10-CM | POA: Diagnosis not present

## 2021-10-09 MED ORDER — BEVACIZUMAB CHEMO INJECTION 1.25MG/0.05ML SYRINGE FOR KALEIDOSCOPE
1.2500 mg | INTRAVITREAL | Status: AC | PRN
  Administered 2021-10-09: 1.25 mg via INTRAVITREAL

## 2021-10-24 DIAGNOSIS — E039 Hypothyroidism, unspecified: Secondary | ICD-10-CM | POA: Diagnosis not present

## 2021-11-05 NOTE — Progress Notes (Addendum)
?[facility] Note ? ?11/07/2021 ?  ? ?CHIEF COMPLAINT ?Patient presents for Retina Follow Up ? ?HISTORY OF PRESENT ILLNESS: ?Chad [name] is a 76 y.o. male who presents to the clinic today for:  ? ?HPI   ? ? Retina Follow Up   ?Patient presents with  Diabetic Retinopathy (IVA OU #5 10/09/21 ?HTN Retinopathy OU).  In both eyes.  Duration of 4 weeks.  Since onset it is stable.  I, the attending physician,  performed the HPI with the patient and updated documentation appropriately. ? ?  ?  ? ? Comments   ?Patient feels that there has been no significant change in his vision. His blood sugar was 170 this morning at last check. His A1C is 5.7. He states that his blood pressure is controlled with medications.   ? ?  ?  ?Last edited by Bernarda Caffey, MD on 11/09/2021 10:47 PM.  ?  ?Pt states he has not seen much improvement in his vision ? ?Referring physician: ?Seward Carol, MD ?[address] ?[address] ?[place],  [place] [address] ? ?HISTORICAL INFORMATION:  ? ?Selected notes from the [place] [facility] ?Referred by Dr. Delman Cheadle for diabetic retinopathy with DME OU ?[name]:  ?Ocular Hx- ?PMH- ?  ? ?CURRENT MEDICATIONS: ?No current outpatient medications on file. (Ophthalmic Drugs)  ? ?No current facility-administered medications for this visit. (Ophthalmic Drugs)  ? ?Current Outpatient Medications (Other)  ?Medication Sig  ? B-D ULTRAFINE III SHORT PEN 31G X 8 MM MISC SMARTSIG:1 Each SUB-Q Daily  ? Calcium Carb-Cholecalciferol (CALCIUM 500 + D) 500-125 MG-UNIT TABS Take 1 tablet by mouth daily.  ? gabapentin (NEURONTIN) 300 MG capsule TAKE ONE CAPSULE BY MOUTH EVERY MORNING, ONE CAPSULE DAILY IN THE AFTERNOON AND ONE CAPSULE EVERY NIGHT AT BEDTIME  ? insulin glargine (LANTUS) 100 UNIT/ML injection Inject 10 Units into the skin daily.  ? JARDIANCE 10 MG TABS tablet Take 10 mg by mouth daily.  ? levothyroxine (SYNTHROID, LEVOTHROID) 75 MCG tablet Take 75 mcg by mouth daily before breakfast.   ?  losartan (COZAAR) 25 MG tablet Take 25 mg by mouth daily. bid  ? metoprolol succinate (TOPROL-XL) 25 MG 24 hr tablet TAKE ONE TABLET BY MOUTH DAILY  ? nortriptyline (PAMELOR) 10 MG capsule TAKE THREE CAPSULES BY MOUTH EVERY NIGHT AT BEDTIME  ? ONETOUCH VERIO test strip   ? rosuvastatin (CRESTOR) 40 MG tablet Take 1 tablet by mouth daily.  ? ?No current facility-administered medications for this visit. (Other)  ? ?REVIEW OF SYSTEMS: ?ROS   ?Positive for: Endocrine, Cardiovascular, Eyes ?Negative for: Constitutional, Gastrointestinal, Neurological, Skin, Genitourinary, Musculoskeletal, HENT, Respiratory, Psychiatric, Allergic/Imm, Heme/Lymph ?Last edited by Annie Paras, COT on 11/07/2021  1:42 PM.  ?  ? ?ALLERGIES ?No Known Allergies ? ?PAST MEDICAL HISTORY ?Past Medical History:  ?Diagnosis Date  ? Cataract   ? CKD (chronic kidney disease)   ? Colon polyps   ? Diabetes ([place])   ? Diabetic polyneuropathy associated with type 2 diabetes mellitus ([place]) 01/15/2017  ? Diabetic retinopathy ([place])   ? GERD (gastroesophageal reflux disease)   ? Hypercholesterolemia   ? Hypertension   ? Hypertensive retinopathy   ? Hypothyroidism   ? Polyneuropathy   ? Proteinuria   ? ?History reviewed. No pertinent surgical history. ? ?FAMILY HISTORY ?Family History  ?Problem Relation Age of Onset  ? Stroke Father   ? Glaucoma Father   ? ?SOCIAL HISTORY ?Social History  ? ?Tobacco Use  ?  Smoking status: Former  ? Smokeless tobacco: Never  ?Vaping Use  ? Vaping Use: Never used  ?Substance Use Topics  ? Alcohol use: Yes  ? Drug use: No  ?  ? ?  ?OPHTHALMIC EXAM: ?Base Eye Exam   ? ? Visual Acuity (Snellen - Linear)   ? ?   Right Left  ? Dist [place] 20/40 -1 20/30 +2  ? Dist ph [place] NI NI  ? ?  ?  ? ? Tonometry (Tonopen, 1:46 PM)   ? ?   Right Left  ? Pressure 16 16  ? ?  ?  ? ? Pupils   ? ?   Pupils Dark Light Shape React APD  ? Right PERRL 3 2 Round Brisk None  ? Left PERRL 3 2 Round Brisk None  ? ?  ?  ? ? Visual Fields   ? ?   Left Right   ?  Full Full  ? ?  ?  ? ? Extraocular Movement   ? ?   Right Left  ?  Full, Ortho Full, Ortho  ? ?  ?  ? ? Neuro/Psych   ? ? Oriented x3: Yes  ? Mood/Affect: Normal  ? ?  ?  ? ? Dilation   ? ? Both eyes: 2.5% Phenylephrine, 1.0% Mydriacyl @ 1:43 PM  ? ?  ?  ? ?  ? ?Slit Lamp and Fundus Exam   ? ? External Exam   ? ?   Right Left  ? External Normal Normal  ? ?  ?  ? ? Slit Lamp Exam   ? ?   Right Left  ? Lids/Lashes Dermatochalasis - upper lid, mild Telangiectasia Dermatochalasis - upper lid, Meibomian gland dysfunction  ? Conjunctiva/Sclera White and quiet White and quiet  ? Cornea 2+ fine, inferior Punctate epithelial erosions 1+ Punctate epithelial erosions, mild tear film debris  ? Anterior Chamber deep, clear, narrow angles deep, clear, narrow angles  ? Iris Round and dilated, mild anterior bowing, No NVI Round and dilated, mild anterior bowing, No NVI  ? Lens 2-3+ Nuclear sclerosis, 2+ Cortical cataract 2-3+ Nuclear sclerosis, 2+ Cortical cataract  ? Anterior Vitreous Vitreous syneresis Vitreous syneresis  ? ?  ?  ? ? Fundus Exam   ? ?   Right Left  ? Disc Pink and Sharp, no NVD Pink and Sharp, no NVD  ? C/D Ratio 0.3 0.3  ? Macula Blunted foveal reflex, mild ERM, scattered MA and exudate, persistent central cystic changes / edema -- slightly improved Flat, good foveal reflex, scattered MA and focal exudate, scattered cystic changes -- improving  ? Vessels attenuated, Tortuous attenuated, Tortuous, Copper wiring, Macroaneurysm / CWS along IT arcades - improving  ? Periphery Attached, scattered MA greatest posteriorly, focal CWS nasal to disc Attached, scattered MA/DBH greatest posteriorly  ? ?  ?  ? ?  ? ?IMAGING AND PROCEDURES  ?Imaging and Procedures for 11/07/2021 ? ?OCT, Retina - OU - Both Eyes   ? ?   ?Right Eye ?Quality was good. Central Foveal Thickness: 380. Progression has improved. Findings include abnormal foveal contour, intraretinal fluid, no SRF, intraretinal hyper-reflective material, macular  pucker, epiretinal membrane (Mild interval improvement in IRF).  ? ?Left Eye ?Quality was good. Central Foveal Thickness: 323. Progression has improved. Findings include intraretinal fluid, no SRF, intraretinal hyper-reflective material, abnormal foveal contour (Persistent IRF/IRHM, Focal IRHM and retinal thickening along IT arcades -- macroaneurysm -- slightly improved).  ? ?Notes ?*Images captured and stored  on drive ? ?Diagnosis / Impression:  ?+DME OU ?OD: Mild interval improvement in IRF ?OS: persistent IRF/IRHM, Focal IRHM and retinal thickening along IT arcades -- macroaneurysm -- slightly improved ? ?Clinical management:  ?See below ? ?Abbreviations: NFP - Normal foveal profile. CME - cystoid macular edema. PED - pigment epithelial detachment. IRF - intraretinal fluid. SRF - subretinal fluid. EZ - ellipsoid zone. ERM - epiretinal membrane. ORA - outer retinal atrophy. ORT - outer retinal tubulation. SRHM - subretinal hyper-reflective material. IRHM - intraretinal hyper-reflective material ? ? ?  ? ?Intravitreal Injection, Pharmacologic Agent - OD - Right Eye   ? ?   ?Time Out ?11/07/2021. 2:38 PM. Confirmed correct patient, procedure, site, and patient consented.  ? ?Anesthesia ?Topical anesthesia was used. Anesthetic medications included Proparacaine 0.5%, Lidocaine 2%.  ? ?Procedure ?Preparation included 5% betadine to ocular surface, eyelid speculum. A supplied needle was used.  ? ?Injection: ?1.25 mg Bevacizumab 1.'25mg'$ /0.46m ?  Route: Intravitreal, Site: Right Eye ?  NDC: [id number] Lot: [id number] , Expiration date: 12/03/2021  ? ?Post-op ?Post injection exam found visual acuity of at least counting fingers. The patient tolerated the procedure well. There were no complications. The patient received written and verbal post procedure care education. Post injection medications were not given.  ? ?  ? ?Intravitreal Injection, Pharmacologic Agent - OS - Left Eye   ? ?   ?Time Out ?11/07/2021. 2:38 PM.  Confirmed correct patient, procedure, site, and patient consented.  ? ?Anesthesia ?Topical anesthesia was used. Anesthetic medications included Proparacaine 0.5%, Lidocaine 2%.  ? ?Procedure ?Preparation included

## 2021-11-07 ENCOUNTER — Ambulatory Visit (INDEPENDENT_AMBULATORY_CARE_PROVIDER_SITE_OTHER): Payer: PPO | Admitting: Ophthalmology

## 2021-11-07 ENCOUNTER — Encounter (INDEPENDENT_AMBULATORY_CARE_PROVIDER_SITE_OTHER): Payer: Self-pay | Admitting: Ophthalmology

## 2021-11-07 DIAGNOSIS — I10 Essential (primary) hypertension: Secondary | ICD-10-CM

## 2021-11-07 DIAGNOSIS — H35012 Changes in retinal vascular appearance, left eye: Secondary | ICD-10-CM

## 2021-11-07 DIAGNOSIS — E113313 Type 2 diabetes mellitus with moderate nonproliferative diabetic retinopathy with macular edema, bilateral: Secondary | ICD-10-CM

## 2021-11-07 DIAGNOSIS — H35033 Hypertensive retinopathy, bilateral: Secondary | ICD-10-CM

## 2021-11-07 DIAGNOSIS — H25813 Combined forms of age-related cataract, bilateral: Secondary | ICD-10-CM

## 2021-11-09 ENCOUNTER — Encounter (INDEPENDENT_AMBULATORY_CARE_PROVIDER_SITE_OTHER): Payer: Self-pay | Admitting: Ophthalmology

## 2021-11-10 MED ORDER — BEVACIZUMAB CHEMO INJECTION 1.25MG/0.05ML SYRINGE FOR KALEIDOSCOPE
1.2500 mg | INTRAVITREAL | Status: AC | PRN
  Administered 2021-11-10: 1.25 mg via INTRAVITREAL

## 2021-11-25 NOTE — Progress Notes (Addendum)
?[facility] Note ? ?12/05/2021 ?  ? ?CHIEF COMPLAINT ?Patient presents for Retina Follow Up ? ?HISTORY OF PRESENT ILLNESS: ?Chad [name] is a 76 y.o. male who presents to the clinic today for:  ? ?HPI   ? ? Retina Follow Up   ?Patient presents with  Diabetic Retinopathy.  In both eyes.  This started 4 weeks ago.  I, the attending physician,  performed the HPI with the patient and updated documentation appropriately. ? ?  ?  ? ? Comments   ?Patient here for 4 weeks retina follow up for NPDR OU. Patient states vision is ok. No improvement. No worse. No eye pain. Started back on refresh QD OU.  ? ?  ?  ?Last edited by Bernarda Caffey, MD on 12/05/2021 12:30 PM.  ?  ?Pt states vision seems stable, he moved this week so he has been under a lot of stress, he states his blood sugar and BP have been okay ? ?Referring physician: ?Seward Carol, MD ?[address] ?[address] ?[place],  [place] [address] ? ?HISTORICAL INFORMATION:  ? ?Selected notes from the [facility] ?Referred by Dr. Delman Cheadle for diabetic retinopathy with DME OU ?[name]:  ?Ocular Hx- ?PMH- ?  ? ?CURRENT MEDICATIONS: ?No current outpatient medications on file. (Ophthalmic Drugs)  ? ?No current facility-administered medications for this visit. (Ophthalmic Drugs)  ? ?Current Outpatient Medications (Other)  ?Medication Sig  ? B-D ULTRAFINE III SHORT PEN 31G X 8 MM MISC SMARTSIG:1 Each SUB-Q Daily  ? Calcium Carb-Cholecalciferol (CALCIUM 500 + D) 500-125 MG-UNIT TABS Take 1 tablet by mouth daily.  ? gabapentin (NEURONTIN) 300 MG capsule TAKE ONE CAPSULE BY MOUTH EVERY MORNING, ONE CAPSULE DAILY IN THE AFTERNOON AND ONE CAPSULE EVERY NIGHT AT BEDTIME  ? insulin glargine (LANTUS) 100 UNIT/ML injection Inject 10 Units into the skin daily.  ? JARDIANCE 10 MG TABS tablet Take 10 mg by mouth daily.  ? levothyroxine (SYNTHROID, LEVOTHROID) 75 MCG tablet Take 75 mcg by mouth daily before breakfast.   ? losartan (COZAAR) 25 MG tablet Take 25  mg by mouth daily. bid  ? metoprolol succinate (TOPROL-XL) 25 MG 24 hr tablet TAKE ONE TABLET BY MOUTH DAILY  ? nortriptyline (PAMELOR) 10 MG capsule TAKE THREE CAPSULES BY MOUTH EVERY NIGHT AT BEDTIME  ? ONETOUCH VERIO test strip   ? rosuvastatin (CRESTOR) 40 MG tablet Take 1 tablet by mouth daily.  ? ?No current facility-administered medications for this visit. (Other)  ? ?REVIEW OF SYSTEMS: ?ROS   ?Positive for: Endocrine, Cardiovascular, Eyes ?Negative for: Constitutional, Gastrointestinal, Neurological, Skin, Genitourinary, Musculoskeletal, HENT, Respiratory, Psychiatric, Allergic/Imm, Heme/Lymph ?Last edited by Theodore Demark, COA on 12/05/2021 10:25 AM.  ?  ? ?ALLERGIES ?No Known Allergies ? ?PAST MEDICAL HISTORY ?Past Medical History:  ?Diagnosis Date  ? Cataract   ? CKD (chronic kidney disease)   ? Colon polyps   ? Diabetes ([place])   ? Diabetic polyneuropathy associated with type 2 diabetes mellitus ([place]) 01/15/2017  ? Diabetic retinopathy ([place])   ? GERD (gastroesophageal reflux disease)   ? Hypercholesterolemia   ? Hypertension   ? Hypertensive retinopathy   ? Hypothyroidism   ? Polyneuropathy   ? Proteinuria   ? ?History reviewed. No pertinent surgical history. ? ?FAMILY HISTORY ?Family History  ?Problem Relation Age of Onset  ? Stroke Father   ? Glaucoma Father   ? ?SOCIAL HISTORY ?Social History  ? ?Tobacco Use  ? Smoking status: Former  ?  Smokeless tobacco: Never  ?Vaping Use  ? Vaping Use: Never used  ?Substance Use Topics  ? Alcohol use: Yes  ? Drug use: No  ?  ? ?  ?OPHTHALMIC EXAM: ?Base Eye Exam   ? ? Visual Acuity (Snellen - Linear)   ? ?   Right Left  ? Dist [place] 20/50 -2 20/40 -1  ? Dist ph [place] 20/40 -2 20/30  ? ?  ?  ? ? Tonometry (Tonopen, 10:23 AM)   ? ?   Right Left  ? Pressure 17 16  ? ?  ?  ? ? Pupils   ? ?   Dark Light Shape React APD  ? Right 3 2 Round Brisk None  ? Left 3 2 Round Brisk None  ? ?  ?  ? ? Visual Fields (Counting fingers)   ? ?   Left Right  ?  Full Full  ? ?  ?  ? ?  Extraocular Movement   ? ?   Right Left  ?  Full, Ortho Full, Ortho  ? ?  ?  ? ? Neuro/Psych   ? ? Oriented x3: Yes  ? Mood/Affect: Normal  ? ?  ?  ? ? Dilation   ? ? Both eyes: 1.0% Mydriacyl, 2.5% Phenylephrine @ 10:23 AM  ? ?  ?  ? ?  ? ?Slit Lamp and Fundus Exam   ? ? External Exam   ? ?   Right Left  ? External Normal Normal  ? ?  ?  ? ? Slit Lamp Exam   ? ?   Right Left  ? Lids/Lashes Dermatochalasis - upper lid, mild Telangiectasia Dermatochalasis - upper lid, Meibomian gland dysfunction  ? Conjunctiva/Sclera White and quiet White and quiet  ? Cornea 2+ fine, inferior Punctate epithelial erosions 1+ Punctate epithelial erosions, mild tear film debris  ? Anterior Chamber deep, clear, narrow angles deep, clear, narrow angles  ? Iris Round and dilated, mild anterior bowing, No NVI Round and dilated, mild anterior bowing, No NVI  ? Lens 2-3+ Nuclear sclerosis, 2+ Cortical cataract 2-3+ Nuclear sclerosis, 2+ Cortical cataract  ? Anterior Vitreous Vitreous syneresis Vitreous syneresis  ? ?  ?  ? ? Fundus Exam   ? ?   Right Left  ? Disc Pink and Sharp, no NVD Pink and Sharp, no NVD  ? C/D Ratio 0.3 0.3  ? Macula Blunted foveal reflex, mild ERM, scattered MA and exudate, persistent central cystic changes / edema -- slightly increased Flat, good foveal reflex, scattered MA and focal exudate, scattered cystic changes -- slightly increased  ? Vessels attenuated, Tortuous attenuated, Tortuous, Copper wiring, Macroaneurysm / CWS along IT arcades - improving  ? Periphery Attached, scattered MA greatest posteriorly, focal CWS nasal to disc - improved Attached, scattered MA/DBH greatest posteriorly, focal CWS inferior to IT arcades  ? ?  ?  ? ?  ? ?IMAGING AND PROCEDURES  ?Imaging and Procedures for 12/05/2021 ? ?OCT, Retina - OU - Both Eyes   ? ?   ?Right Eye ?Quality was good. Central Foveal Thickness: 377. Progression has worsened. Findings include abnormal foveal contour, intraretinal fluid, no SRF, intraretinal  hyper-reflective material, macular pucker, epiretinal membrane (Mild interval increase in IRF / edema temporal macula and fovea).  ? ?Left Eye ?Quality was good. Central Foveal Thickness: 324. Progression has worsened. Findings include intraretinal fluid, no SRF, intraretinal hyper-reflective material, abnormal foveal contour (Mild interval increase in IRF/IRHM temporal fovea and macula, Focal IRHM and  retinal thickening along IT arcades -- macroaneurysm -- improved).  ? ?Notes ?*Images captured and stored on drive ? ?Diagnosis / Impression:  ?+DME OU ?OD: Mild interval increase in IRF / edema temporal macula and fovea ?OS: Mild interval increase in IRF/IRHM temporal fovea and macula, Focal IRHM and retinal thickening along IT arcades -- macroaneurysm -- improved ? ?Clinical management:  ?See below ? ?Abbreviations: NFP - Normal foveal profile. CME - cystoid macular edema. PED - pigment epithelial detachment. IRF - intraretinal fluid. SRF - subretinal fluid. EZ - ellipsoid zone. ERM - epiretinal membrane. ORA - outer retinal atrophy. ORT - outer retinal tubulation. SRHM - subretinal hyper-reflective material. IRHM - intraretinal hyper-reflective material ? ? ?  ? ?Intravitreal Injection, Pharmacologic Agent - OD - Right Eye   ? ?   ?Time Out ?12/05/2021. 11:11 AM. Confirmed correct patient, procedure, site, and patient consented.  ? ?Anesthesia ?Topical anesthesia was used. Anesthetic medications included Proparacaine 0.5%, Lidocaine 2%.  ? ?Procedure ?Preparation included 5% betadine to ocular surface, eyelid speculum. A (32g) needle was used.  ? ?Injection: ?2 mg aflibercept 2 MG/0.05ML ?  Route: Intravitreal, Site: Right Eye ?  Farm Loop: [id number], Lot: [id number], Expiration date: 11/24/2022, Waste: 0 mL  ? ?Post-op ?Post injection exam found visual acuity of at least counting fingers. The patient tolerated the procedure well. There were no complications. The patient received written and verbal post procedure care  education. Post injection medications were not given.  ? ?Notes ?**SAMPLE MEDICATION ADMINISTERED** ? ?  ? ?Intravitreal Injection, Pharmacologic Agent - OS - Left Eye   ? ?   ?Time Out ?12/05/2021. 11:11 AM. Confirmed

## 2021-12-03 DIAGNOSIS — E039 Hypothyroidism, unspecified: Secondary | ICD-10-CM | POA: Diagnosis not present

## 2021-12-05 ENCOUNTER — Ambulatory Visit (INDEPENDENT_AMBULATORY_CARE_PROVIDER_SITE_OTHER): Payer: PPO | Admitting: Ophthalmology

## 2021-12-05 ENCOUNTER — Encounter (INDEPENDENT_AMBULATORY_CARE_PROVIDER_SITE_OTHER): Payer: Self-pay | Admitting: Ophthalmology

## 2021-12-05 DIAGNOSIS — I10 Essential (primary) hypertension: Secondary | ICD-10-CM

## 2021-12-05 DIAGNOSIS — H35033 Hypertensive retinopathy, bilateral: Secondary | ICD-10-CM

## 2021-12-05 DIAGNOSIS — E113313 Type 2 diabetes mellitus with moderate nonproliferative diabetic retinopathy with macular edema, bilateral: Secondary | ICD-10-CM

## 2021-12-05 DIAGNOSIS — H25813 Combined forms of age-related cataract, bilateral: Secondary | ICD-10-CM

## 2021-12-05 DIAGNOSIS — H35012 Changes in retinal vascular appearance, left eye: Secondary | ICD-10-CM | POA: Diagnosis not present

## 2021-12-05 MED ORDER — BEVACIZUMAB CHEMO INJECTION 1.25MG/0.05ML SYRINGE FOR KALEIDOSCOPE
1.2500 mg | INTRAVITREAL | Status: AC | PRN
  Administered 2021-12-05: 1.25 mg via INTRAVITREAL

## 2021-12-05 MED ORDER — AFLIBERCEPT 2MG/0.05ML IZ SOLN FOR KALEIDOSCOPE
2.0000 mg | INTRAVITREAL | Status: AC | PRN
  Administered 2021-12-05: 2 mg via INTRAVITREAL

## 2021-12-08 DIAGNOSIS — N1832 Chronic kidney disease, stage 3b: Secondary | ICD-10-CM | POA: Diagnosis not present

## 2021-12-16 DIAGNOSIS — E785 Hyperlipidemia, unspecified: Secondary | ICD-10-CM | POA: Diagnosis not present

## 2021-12-16 DIAGNOSIS — I129 Hypertensive chronic kidney disease with stage 1 through stage 4 chronic kidney disease, or unspecified chronic kidney disease: Secondary | ICD-10-CM | POA: Diagnosis not present

## 2021-12-16 DIAGNOSIS — E1122 Type 2 diabetes mellitus with diabetic chronic kidney disease: Secondary | ICD-10-CM | POA: Diagnosis not present

## 2021-12-16 DIAGNOSIS — N1832 Chronic kidney disease, stage 3b: Secondary | ICD-10-CM | POA: Diagnosis not present

## 2021-12-16 DIAGNOSIS — E039 Hypothyroidism, unspecified: Secondary | ICD-10-CM | POA: Diagnosis not present

## 2021-12-23 NOTE — Progress Notes (Signed)
Cardiology Office Note   Date:  12/26/2021   ID:  Chad [name], DOB [birth year], MRN [id number]  PCP:  Seward Carol, MD  Cardiologist:   Jenkins Rouge, MD   No chief complaint on file.      History of Present Illness:  76 y.o. with DM, HTN, HLD, hypothyroidism and polyneuropathy Seen in f/u for AS  Echo 06/30/19  EF 60-65% mild AS mean gradient 19 mmHg peak 35 mmHg Echo 12/7/21reviewed progression of AS to moderate mean gradient 29 mmHg peak 50 mmHg  And DVI 0.28  Echo 06/28/21 mean gradient 39.5 peak 69.9 AVA 0.8 cm2 DVI 0.25  Echo:  today prelim mean gradient 44 severe AS   Normal ETT 09/03/17 maximum HR was only 110 73% PMHR Calcium score 08/27/17  795 76 st percentile dense in LAD he is on statin  Myovue Lexiscan normal 01/31/20 no ischemia EF 60%   Started on Pamelor for depression   Seeing neurology for peripheral neuropathy in feet On gabapentin and nortriptyline   [name] one of my patients Met at [facility]   Has had CRF with Cr as high as 1.8 on ARB per nephrology   Denies dyspnea, chest pain or syncope   He has a son who is a GI doctor in [facility].   He has severe asymptomatic AS. Moved to [place] and exercises regularly with no issues Discussed fact that asymptomatic severe AS usually develop symptoms within 2 years Discussed cath/CTA and choice between TAVR/SAVR. He prefers to wait 6 months and repeat echo before preceding with cath or intervention He is intelligent and reliable in regard to his health and symptoms and I think this is ok  Past Medical History:  Diagnosis Date   Cataract    CKD (chronic kidney disease)    Colon polyps    Diabetes ([place])    Diabetic polyneuropathy associated with type 2 diabetes mellitus ([place] [facility]) 01/15/2017   Diabetic retinopathy ([facility])    GERD (gastroesophageal reflux disease)    Hypercholesterolemia    Hypertension    Hypertensive retinopathy    Hypothyroidism    Polyneuropathy    Proteinuria     History  reviewed. No pertinent surgical history.   Current Outpatient Medications  Medication Sig Dispense Refill   B-D ULTRAFINE III SHORT PEN 31G X 8 MM MISC SMARTSIG:1 Each SUB-Q Daily     Calcium Carb-Cholecalciferol (CALCIUM 500 + D) 500-125 MG-UNIT TABS Take 1 tablet by mouth daily.     gabapentin (NEURONTIN) 300 MG capsule TAKE ONE CAPSULE BY MOUTH EVERY MORNING, ONE CAPSULE DAILY IN THE AFTERNOON AND ONE CAPSULE EVERY NIGHT AT BEDTIME 270 capsule 1   insulin glargine (LANTUS) 100 UNIT/ML injection Inject 10 Units into the skin daily.     JARDIANCE 10 MG TABS tablet Take 10 mg by mouth daily.     levothyroxine (SYNTHROID, LEVOTHROID) 75 MCG tablet Take 75 mcg by mouth daily before breakfast.      losartan (COZAAR) 25 MG tablet Take 25 mg by mouth daily. bid     metoprolol succinate (TOPROL-XL) 25 MG 24 hr tablet TAKE ONE TABLET BY MOUTH DAILY 90 tablet 3   nortriptyline (PAMELOR) 10 MG capsule TAKE THREE CAPSULES BY MOUTH EVERY NIGHT AT BEDTIME 270 capsule 3   ONETOUCH VERIO test strip      rosuvastatin (CRESTOR) 40 MG tablet Take 1 tablet by mouth daily.     losartan-hydrochlorothiazide (HYZAAR) 100-25 MG tablet Take 1 tablet by mouth  daily.     No current facility-administered medications for this visit.    Allergies:   Patient has no known allergies.    Social History:  The patient  reports that he has quit smoking. He has never used smokeless tobacco. He reports current alcohol use. He reports that he does not use drugs.   Family History:  The patient's family history includes Glaucoma in his father; Stroke in his father.    ROS:  Please see the history of present illness.   Otherwise, review of systems are positive for none.   All other systems are reviewed and negative.    PHYSICAL EXAM: VS:  BP 114/62   Pulse (!) 51   Ht 5' 8.5" (1.74 m)   Wt 179 lb (81.2 kg)   SpO2 98%   BMI 26.82 kg/m  , BMI Body mass index is 26.82 kg/m. Affect appropriate Healthy:  appears stated  age 62: normal Neck supple with no adenopathy JVP normal no bruits no thyromegaly Lungs clear with no wheezing and good diaphragmatic motion Heart:  S1/S2 AS  murmur, no rub, gallop or click PMI normal Abdomen: benighn, BS positve, no tenderness, no AAA no bruit.  No HSM or HJR Distal pulses intact with no bruits No edema Neuro sensory neuropathy in arms feet  Skin warm and dry No muscular weakness   EKG:  03/28/19 SR rate 77 QT 378    Recent Labs: No results found for requested labs within last 8760 hours.    Lipid Panel No results found for: CHOL, TRIG, HDL, CHOLHDL, VLDL, LDLCALC, LDLDIRECT    Wt Readings from Last 3 Encounters:  12/26/21 179 lb (81.2 kg)  06/27/21 183 lb (83 kg)  04/28/21 186 lb 6.4 oz (84.6 kg)      Other studies Reviewed: Additional studies/ records that were reviewed today include: Office notes neurology and primary .    ASSESSMENT AND PLAN:  1.  HTN: nephrology changed norvasc to ARB stable labs with primary  2. DM Discussed low carb diet.  Target hemoglobin A1c is 6.5 or less.  Continue current medications. A1c 6.2 3. HLD on statin labs with primary  4. Neuropathy f/u neuro Dr Posey Pronto severe sensorimotor polyneuropathy Rx with gabapentin and nortriptyline  5. Thyroid on replacement TSH normal  6. CAD:  High calcium score 08/27/17 795 81 st percentile dense in LAD Normal lexiscan myovue 01/31/20 stable  7. AS:  TTE 12/26/2021 with mean gradient  44 mHg AVA 0.68 cm2 slight progression from previous Asymptomatic F/U in 6 months with echo likely need CTA/Cath within next 2 years with intervention either SAVR/TAVR  8. Neuropathy :  F/U Dr Posey Pronto isolated to feet DM well controlled continue gabapentin and nortriptyline     Current medicines are reviewed at length with the patient today.  The patient does not have concerns regarding medicines.  The following changes have been made:  no change  Labs/ tests ordered today include:   Echo for AS 6 months     Disposition:   FU with cardiology  6 months     Signed, Jenkins Rouge, MD  12/26/2021 10:06 AM    [facility] [place], [place], [place]  [address] Phone: [phone]; Fax: [phone]

## 2021-12-24 DIAGNOSIS — E785 Hyperlipidemia, unspecified: Secondary | ICD-10-CM | POA: Diagnosis not present

## 2021-12-24 DIAGNOSIS — N184 Chronic kidney disease, stage 4 (severe): Secondary | ICD-10-CM | POA: Diagnosis not present

## 2021-12-24 DIAGNOSIS — E039 Hypothyroidism, unspecified: Secondary | ICD-10-CM | POA: Diagnosis not present

## 2021-12-24 DIAGNOSIS — E1122 Type 2 diabetes mellitus with diabetic chronic kidney disease: Secondary | ICD-10-CM | POA: Diagnosis not present

## 2021-12-24 DIAGNOSIS — I10 Essential (primary) hypertension: Secondary | ICD-10-CM | POA: Diagnosis not present

## 2021-12-26 ENCOUNTER — Ambulatory Visit: Payer: PPO | Admitting: Cardiovascular Disease

## 2021-12-26 ENCOUNTER — Encounter: Payer: Self-pay | Admitting: Cardiovascular Disease

## 2021-12-26 ENCOUNTER — Ambulatory Visit (HOSPITAL_COMMUNITY): Payer: PPO | Attending: Cardiology

## 2021-12-26 DIAGNOSIS — I350 Nonrheumatic aortic (valve) stenosis: Secondary | ICD-10-CM | POA: Diagnosis not present

## 2021-12-26 DIAGNOSIS — E782 Mixed hyperlipidemia: Secondary | ICD-10-CM | POA: Diagnosis not present

## 2021-12-26 DIAGNOSIS — I10 Essential (primary) hypertension: Secondary | ICD-10-CM | POA: Diagnosis not present

## 2021-12-26 NOTE — Patient Instructions (Signed)
Medication Instructions:  Your physician recommends that you continue on your current medications as directed. Please refer to the Current Medication list given to you today.  *If you need a refill on your cardiac medications before your next appointment, please call your pharmacy*  Lab Work: If you have labs (blood work) drawn today and your tests are completely normal, you will receive your results only by:  (if you have MyChart) OR A paper copy in the mail If you have any lab test that is abnormal or we need to change your treatment, we will call you to review the results.  Testing/Procedures: Your physician has requested that you have an echocardiogram in 6 months same day as office visit. Echocardiography is a painless test that uses sound waves to create images of your heart. It provides your doctor with information about the size and shape of your heart and how well your heart's chambers and valves are working. This procedure takes approximately one hour. There are no restrictions for this procedure.  Follow-Up: At [facility], you and your health needs are our priority.  As part of our continuing mission to provide you with exceptional heart care, we have created designated Provider Care Teams.  These Care Teams include your primary Cardiologist (physician) and Advanced Practice Providers (APPs -  Physician Assistants and Nurse Practitioners) who all work together to provide you with the care you need, when you need it.  We recommend signing up for the patient portal called "MyChart".  Sign up information is provided on this After Visit Summary.  MyChart is used to connect with patients for Virtual Visits (Telemedicine).  Patients are able to view lab/test results, encounter notes, upcoming appointments, etc.  Non-urgent messages can be sent to your provider as well.   To learn more about what you can do with MyChart, go to [email or web address].    Your next  appointment:   6 month(s)  The format for your next appointment:   In Person  Provider:   Jenkins Rouge, MD {    Important Information About Sugar

## 2021-12-30 ENCOUNTER — Other Ambulatory Visit: Payer: Self-pay | Admitting: Neurology

## 2021-12-30 LAB — ECHOCARDIOGRAM COMPLETE
AR max vel: 0.76 cm2
AV Area VTI: 0.64 cm2
AV Area mean vel: 0.71 cm2
AV Mean grad: 40.7 mmHg
AV Peak grad: 68.4 mmHg
Ao pk vel: 4.14 m/s
Area-P 1/2: 2.28 cm2
S' Lateral: 1.8 cm

## 2021-12-30 NOTE — Progress Notes (Signed)
[facility] Note  01/02/2022    CHIEF COMPLAINT Patient presents for Retina Follow Up  HISTORY OF PRESENT ILLNESS: Chad [name] is a 76 y.o. male who presents to the clinic today for:   HPI     Retina Follow Up   Patient presents with  Diabetic Retinopathy.  In both eyes.  Duration of 4 weeks.  Since onset it is stable.  I, the attending physician,  performed the HPI with the patient and updated documentation appropriately.        Comments   4 week follow up NPDR OU- No changes in vision OU.  BS 110 this morning A1C 5.7      Last edited by Bernarda Caffey, MD on 01/02/2022 11:46 AM.     Pt states   Referring physician: Seward Carol, MD [address] [place],  [place] [address]  HISTORICAL INFORMATION:   Selected notes from the MEDICAL RECORD NUMBER Referred by Dr. Delman Cheadle for diabetic retinopathy with DME OU [name]:  Ocular Hx- PMH-    CURRENT MEDICATIONS: No current outpatient medications on file. (Ophthalmic Drugs)   No current facility-administered medications for this visit. (Ophthalmic Drugs)   Current Outpatient Medications (Other)  Medication Sig   Calcium Carb-Cholecalciferol (CALCIUM 500 + D) 500-125 MG-UNIT TABS Take 1 tablet by mouth daily.   gabapentin (NEURONTIN) 300 MG capsule TAKE 1 CAPSULE BY MOUTH DAILY IN THE MORNING, TAKE 1 CAPSULE BY MOUTH DAILY IN THE AFTERNOON AND TAKE 2 CAPSULES BY MOUTH AT BEDTIME   insulin glargine (LANTUS) 100 UNIT/ML injection Inject 10 Units into the skin daily.   JARDIANCE 10 MG TABS tablet Take 10 mg by mouth daily.   levothyroxine (SYNTHROID, LEVOTHROID) 75 MCG tablet Take 75 mcg by mouth daily before breakfast.    losartan (COZAAR) 25 MG tablet Take 25 mg by mouth daily. bid   losartan-hydrochlorothiazide (HYZAAR) 100-25 MG tablet Take 1 tablet by mouth daily.   metoprolol succinate (TOPROL-XL) 25 MG 24 hr tablet TAKE ONE TABLET BY MOUTH DAILY   nortriptyline (PAMELOR) 10 MG  capsule TAKE THREE CAPSULES BY MOUTH EVERY NIGHT AT BEDTIME   rosuvastatin (CRESTOR) 40 MG tablet Take 1 tablet by mouth daily.   B-D ULTRAFINE III SHORT PEN 31G X 8 MM MISC SMARTSIG:1 Each SUB-Q Daily   ONETOUCH VERIO test strip    No current facility-administered medications for this visit. (Other)   REVIEW OF SYSTEMS: ROS   Positive for: Endocrine, Cardiovascular, Eyes Negative for: Constitutional, Gastrointestinal, Neurological, Skin, Genitourinary, Musculoskeletal, HENT, Respiratory, Psychiatric, Allergic/Imm, Heme/Lymph Last edited by Leonie Douglas, COA on 01/02/2022  7:55 AM.     ALLERGIES No Known Allergies  PAST MEDICAL HISTORY Past Medical History:  Diagnosis Date   Cataract    CKD (chronic kidney disease)    Colon polyps    Diabetes ([place])    Diabetic polyneuropathy associated with type 2 diabetes mellitus ([place]) 01/15/2017   Diabetic retinopathy ([place])    GERD (gastroesophageal reflux disease)    Hypercholesterolemia    Hypertension    Hypertensive retinopathy    Hypothyroidism    Polyneuropathy    Proteinuria    History reviewed. No pertinent surgical history.  FAMILY HISTORY Family History  Problem Relation Age of Onset   Stroke Father    Glaucoma Father    SOCIAL HISTORY Social History   Tobacco Use   Smoking status: Former   Smokeless tobacco: Never  Scientific laboratory technician Use:  Never used  Substance Use Topics   Alcohol use: Yes   Drug use: No       OPHTHALMIC EXAM: Base Eye Exam     Visual Acuity (Snellen - Linear)       Right Left   Dist [place] 20/50 -2 20/40 -2   Dist ph  20/40 -1 20/30 -2         Tonometry (Tonopen, 8:02 AM)       Right Left   Pressure 13 15         Pupils       Dark Light Shape React APD   Right 3 2 Round Brisk None   Left 3 2 Round Brisk None         Visual Fields (Counting fingers)       Left Right    Full Full         Extraocular Movement       Right Left    Full Full          Neuro/Psych     Oriented x3: Yes   Mood/Affect: Normal         Dilation     Both eyes: 1.0% Mydriacyl, 2.5% Phenylephrine @ 8:02 AM           Slit Lamp and Fundus Exam     External Exam       Right Left   External Normal Normal         Slit Lamp Exam       Right Left   Lids/Lashes Dermatochalasis - upper lid, mild Telangiectasia Dermatochalasis - upper lid, Meibomian gland dysfunction   Conjunctiva/Sclera White and quiet White and quiet   Cornea 2+ fine, inferior Punctate epithelial erosions 1+ Punctate epithelial erosions, mild tear film debris   Anterior Chamber deep, clear, narrow angles deep, clear, narrow angles   Iris Round and dilated, mild anterior bowing, No NVI Round and dilated, mild anterior bowing, No NVI   Lens 2-3+ Nuclear sclerosis, 2+ Cortical cataract 2-3+ Nuclear sclerosis, 2+ Cortical cataract   Anterior Vitreous Vitreous syneresis Vitreous syneresis         Fundus Exam       Right Left   Disc Pink and Sharp, no NVD Pink and Sharp, no NVD   C/D Ratio 0.3 0.3   Macula good foveal reflex, mild ERM, scattered MA and exudates, persistent central cystic changes / edema -- improved Flat, good foveal reflex, scattered MA and focal exudates, scattered cystic changes -- improved   Vessels attenuated, Tortuous attenuated, Tortuous, Copper wiring, Macroaneurysm / CWS along IT arcades - improving   Periphery Attached, scattered MA greatest posteriorly, focal CWS nasal to disc - improved Attached, scattered MA/DBH greatest posteriorly, focal CWS inferior to IT arcades           IMAGING AND PROCEDURES  Imaging and Procedures for 01/02/2022  OCT, Retina - OU - Both Eyes       Right Eye Quality was good. Central Foveal Thickness: 355. Progression has improved. Findings include no SRF, abnormal foveal contour, intraretinal hyper-reflective material, epiretinal membrane, intraretinal fluid, macular pucker (interval improvement in IRF / IRHM temporal  macula and fovea).   Left Eye Quality was good. Central Foveal Thickness: 312. Progression has improved. Findings include no SRF, abnormal foveal contour, intraretinal hyper-reflective material, intraretinal fluid (Mild interval improvement in IRF/IRHM temporal fovea and macula, Focal IRHM and retinal thickening along IT arcades -- macroaneurysm -- improved).   Notes *Images captured  and stored on drive  Diagnosis / Impression:  +DME OU OD: interval improvement in IRF / edema temporal macula and fovea OS: Mild interval improvement in IRF/IRHM temporal fovea and macula, Focal IRHM and retinal thickening along IT arcades -- macroaneurysm -- improved  Clinical management:  See below  Abbreviations: NFP - Normal foveal profile. CME - cystoid macular edema. PED - pigment epithelial detachment. IRF - intraretinal fluid. SRF - subretinal fluid. EZ - ellipsoid zone. ERM - epiretinal membrane. ORA - outer retinal atrophy. ORT - outer retinal tubulation. SRHM - subretinal hyper-reflective material. IRHM - intraretinal hyper-reflective material      Intravitreal Injection, Pharmacologic Agent - OD - Right Eye       Time Out 01/02/2022. 8:25 AM. Confirmed correct patient, procedure, site, and patient consented.   Anesthesia Topical anesthesia was used. Anesthetic medications included Lidocaine 2%, Proparacaine 0.5%.   Procedure Preparation included 5% betadine to ocular surface, eyelid speculum. A (32g) needle was used.   Injection: 2 mg aflibercept 2 MG/0.05ML   Route: Intravitreal, Site: Right Eye   NDC: [id number], Lot: [id number], Expiration date: 10/25/2022, Waste: 0 mL   Post-op Post injection exam found visual acuity of at least counting fingers. The patient tolerated the procedure well. There were no complications. The patient received written and verbal post procedure care education. Post injection medications were not given.      Intravitreal Injection, Pharmacologic Agent -  OS - Left Eye       Time Out 01/02/2022. 8:25 AM. Confirmed correct patient, procedure, site, and patient consented.   Anesthesia Topical anesthesia was used. Anesthetic medications included Lidocaine 2%, Proparacaine 0.5%.   Procedure Preparation included 5% betadine to ocular surface, eyelid speculum. A (32g) needle was used.   Injection: 1.25 mg Bevacizumab 1.'25mg'$ /0.52m   Route: Intravitreal, Site: Left Eye   NDC: [id number] Lot:: [id number] Expiration date: 02/08/2022   Post-op Post injection exam found visual acuity of at least counting fingers. The patient tolerated the procedure well. There were no complications. The patient received written and verbal post procedure care education. Post injection medications were not given.            ASSESSMENT/PLAN:    ICD-10-CM   1. Moderate nonproliferative diabetic retinopathy of both eyes with macular edema associated with type 2 diabetes mellitus (HCC)  E11.3313 OCT, Retina - OU - Both Eyes    Intravitreal Injection, Pharmacologic Agent - OD - Right Eye    Intravitreal Injection, Pharmacologic Agent - OS - Left Eye    Bevacizumab (AVASTIN) SOLN 1.25 mg    aflibercept (EYLEA) SOLN 2 mg    2. 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 Essential hypertension  I10     4. Hypertensive retinopathy of both eyes  H35.033     5. Combined forms of age-related cataract of both eyes  H25.813      1. Moderate Non-proliferative diabetic retinopathy w/ DME OU  - s/p IVA OD #1 (11.17.22) - s/p IVA OS #1 (11.14.22), #7 (05.12.23)  - s/p IVA OU #2 (12.15.22), #3 (01.12.23), #4 (02.13.23), #5 (03.16.23), #6 (04.12.23), #7 (05.12.23)  - s/p IVE OD #1 (05.12.23) -- sample - exam shows scattered MA/DBH OU, +edema and exudates - FA (11.14.22) shows vascular perfusion defects OU, no NV OU, OS with focal macroaneurysm along IT arcades - OCT shows OD: interval improvement in IRF / edema temporal macula and fovea; OS: Mild interval  improvement in IRF/IRHM temporal fovea and macula,  Focal IRHM and retinal thickening along IT arcades -- macroaneurysm -- improved  - BCVA 20/40 OD, 20/30 OS -- stable OU  - discussed possible IVA resistance - recommend IVE OD #2,  IVA OS #8 today, 06.09.23 - pt wishes to proceed with injections - RBA of procedure discussed, questions answered - IVA informed consent obtained and signed OU, 11.14.22 - IVE informed consent obtained and signed OU, 05.13.23 - see procedure note - f/u in 4 weeks, DFE, OCT, possible injection(s)  2. Retinal macroaneurysm OS  - focal lesion along IT arcades   - s/p IVA OS as above  - OCT shows mild interval improvement  - recommend IVA OS #7 as above  3,4. Hypertensive retinopathy OU - discussed importance of tight BP control - monitor   5.  Mixed Cataract OU  - being monitored by Dr. Delman Cheadle - The symptoms of cataract, surgical options, and treatments and risks were discussed with patient.  - discussed diagnosis and progression - approaching visual significance   Ophthalmic Meds Ordered this visit:  Meds ordered this encounter  Medications   Bevacizumab (AVASTIN) SOLN 1.25 mg   aflibercept (EYLEA) SOLN 2 mg     Return in about 4 weeks (around 01/30/2022) for f/u NPDR OU, DFE, OCT.  There are no Patient Instructions on file for this visit.   Explained the diagnoses, plan, and follow up with the patient and they expressed understanding.  Patient expressed understanding of the importance of proper follow up care.   This document serves as a record of services personally performed by Gardiner Sleeper, MD, PhD. It was created on their behalf by Leonie Douglas, an ophthalmic technician. The creation of this record is the provider's dictation and/or activities during the visit.    Electronically signed by: Leonie Douglas COA, 01/02/22  12:06 PM  This document serves as a record of services personally performed by Gardiner Sleeper, MD, PhD. It was created on  their behalf by San Jetty. Owens Shark, OA an ophthalmic technician. The creation of this record is the provider's dictation and/or activities during the visit.    Electronically signed by: San Jetty. Owens Shark, [place] 06.09.2023 12:06 PM  Gardiner Sleeper, M.D., Ph.D. Diseases & Surgery of the Retina and Vitreous [facility]  I have reviewed the above documentation for accuracy and completeness, and I agree with the above. Gardiner Sleeper, M.D., Ph.D. 01/02/22 12:06 PM   Abbreviations: M myopia (nearsighted); A astigmatism; H hyperopia (farsighted); P presbyopia; Mrx spectacle prescription;  CTL contact lenses; OD right eye; OS left eye; OU both eyes  XT exotropia; ET esotropia; PEK punctate epithelial keratitis; PEE punctate epithelial erosions; DES dry eye syndrome; MGD meibomian gland dysfunction; ATs artificial tears; PFAT's preservative free artificial tears; [place] nuclear sclerotic cataract; PSC posterior subcapsular cataract; ERM epi-retinal membrane; PVD posterior vitreous detachment; RD retinal detachment; DM diabetes mellitus; DR diabetic retinopathy; NPDR non-proliferative diabetic retinopathy; PDR proliferative diabetic retinopathy; CSME clinically significant macular edema; DME diabetic macular edema; dbh dot blot hemorrhages; CWS cotton wool spot; POAG primary open angle glaucoma; C/D cup-to-disc ratio; HVF humphrey visual field; GVF goldmann visual field; OCT optical coherence tomography; IOP intraocular pressure; BRVO Branch retinal vein occlusion; CRVO central retinal vein occlusion; CRAO central retinal artery occlusion; BRAO branch retinal artery occlusion; RT retinal tear; SB scleral buckle; PPV pars plana vitrectomy; VH Vitreous hemorrhage; PRP panretinal laser photocoagulation; IVK intravitreal kenalog; VMT vitreomacular traction; MH Macular hole;  NVD neovascularization of the disc; NVE  neovascularization elsewhere; AREDS age related eye disease study; ARMD age related macular  degeneration; POAG primary open angle glaucoma; EBMD epithelial/anterior basement membrane dystrophy; ACIOL anterior chamber intraocular lens; IOL intraocular lens; PCIOL posterior chamber intraocular lens; Phaco/IOL phacoemulsification with intraocular lens placement; [place] photorefractive keratectomy; LASIK laser assisted in situ keratomileusis; HTN hypertension; DM diabetes mellitus; COPD chronic obstructive pulmonary disease

## 2021-12-31 DIAGNOSIS — N1832 Chronic kidney disease, stage 3b: Secondary | ICD-10-CM | POA: Diagnosis not present

## 2022-01-02 ENCOUNTER — Encounter (INDEPENDENT_AMBULATORY_CARE_PROVIDER_SITE_OTHER): Payer: Self-pay | Admitting: Ophthalmology

## 2022-01-02 ENCOUNTER — Ambulatory Visit (INDEPENDENT_AMBULATORY_CARE_PROVIDER_SITE_OTHER): Payer: PPO | Admitting: Ophthalmology

## 2022-01-02 DIAGNOSIS — E113313 Type 2 diabetes mellitus with moderate nonproliferative diabetic retinopathy with macular edema, bilateral: Secondary | ICD-10-CM

## 2022-01-02 DIAGNOSIS — I10 Essential (primary) hypertension: Secondary | ICD-10-CM | POA: Diagnosis not present

## 2022-01-02 DIAGNOSIS — H35033 Hypertensive retinopathy, bilateral: Secondary | ICD-10-CM | POA: Diagnosis not present

## 2022-01-02 DIAGNOSIS — H35012 Changes in retinal vascular appearance, left eye: Secondary | ICD-10-CM

## 2022-01-02 DIAGNOSIS — H25813 Combined forms of age-related cataract, bilateral: Secondary | ICD-10-CM | POA: Diagnosis not present

## 2022-01-02 MED ORDER — BEVACIZUMAB CHEMO INJECTION 1.25MG/0.05ML SYRINGE FOR KALEIDOSCOPE
1.2500 mg | INTRAVITREAL | Status: AC | PRN
  Administered 2022-01-02: 1.25 mg via INTRAVITREAL

## 2022-01-02 MED ORDER — AFLIBERCEPT 2MG/0.05ML IZ SOLN FOR KALEIDOSCOPE
2.0000 mg | INTRAVITREAL | Status: AC | PRN
  Administered 2022-01-02: 2 mg via INTRAVITREAL

## 2022-01-21 DIAGNOSIS — N1832 Chronic kidney disease, stage 3b: Secondary | ICD-10-CM | POA: Diagnosis not present

## 2022-01-22 DIAGNOSIS — L538 Other specified erythematous conditions: Secondary | ICD-10-CM | POA: Diagnosis not present

## 2022-01-22 DIAGNOSIS — L578 Other skin changes due to chronic exposure to nonionizing radiation: Secondary | ICD-10-CM | POA: Diagnosis not present

## 2022-01-22 DIAGNOSIS — L820 Inflamed seborrheic keratosis: Secondary | ICD-10-CM | POA: Diagnosis not present

## 2022-01-25 ENCOUNTER — Other Ambulatory Visit: Payer: Self-pay | Admitting: Neurology

## 2022-01-28 NOTE — Progress Notes (Addendum)
[facility] Note  01/30/2022    CHIEF COMPLAINT Patient presents for Retina Follow Up  HISTORY OF PRESENT ILLNESS: Chad [name] is a 76 y.o. male who presents to the clinic today for:   HPI     Retina Follow Up   Patient presents with  Diabetic Retinopathy (IVE OU 06.09.23).  In both eyes.  This started weeks ago.  Duration of 4 weeks.  Since onset it is stable.  I, the attending physician,  performed the HPI with the patient and updated documentation appropriately.        Comments   Patient denies noticing any vision changes at this time. He admits to seeing floaters after the injections. His blood sugar was 155 this morning. His A1C is 5.7.      Last edited by Bernarda Caffey, MD on 01/30/2022  3:22 PM.    Pt states vision is the same.   Referring physician: Melissa Noon, OD [address],  [place] [id number]  HISTORICAL INFORMATION:   Selected notes from the MEDICAL RECORD NUMBER Referred by Dr. Delman Cheadle for diabetic retinopathy with DME OU [name]:  Ocular Hx- PMH-    CURRENT MEDICATIONS: No current outpatient medications on file. (Ophthalmic Drugs)   No current facility-administered medications for this visit. (Ophthalmic Drugs)   Current Outpatient Medications (Other)  Medication Sig   B-D ULTRAFINE III SHORT PEN 31G X 8 MM MISC SMARTSIG:1 Each SUB-Q Daily   Calcium Carb-Cholecalciferol (CALCIUM 500 + D) 500-125 MG-UNIT TABS Take 1 tablet by mouth daily.   gabapentin (NEURONTIN) 300 MG capsule TAKE 1 CAPSULE EVERY MORNING, 1 CAPSULE EVERY AFTERNOON AND 2 CAPSULES AT BEDTIME   insulin glargine (LANTUS) 100 UNIT/ML injection Inject 10 Units into the skin daily.   JARDIANCE 10 MG TABS tablet Take 10 mg by mouth daily.   levothyroxine (SYNTHROID, LEVOTHROID) 75 MCG tablet Take 75 mcg by mouth daily before breakfast.    losartan (COZAAR) 25 MG tablet Take 25 mg by mouth daily. bid   losartan-hydrochlorothiazide (HYZAAR) 100-25 MG tablet Take  1 tablet by mouth daily.   metoprolol succinate (TOPROL-XL) 25 MG 24 hr tablet TAKE ONE TABLET BY MOUTH DAILY   nortriptyline (PAMELOR) 10 MG capsule TAKE THREE CAPSULES BY MOUTH EVERY NIGHT AT BEDTIME   ONETOUCH VERIO test strip    rosuvastatin (CRESTOR) 40 MG tablet Take 1 tablet by mouth daily.   No current facility-administered medications for this visit. (Other)   REVIEW OF SYSTEMS: ROS   Positive for: Endocrine, Cardiovascular, Eyes Negative for: Constitutional, Gastrointestinal, Neurological, Skin, Genitourinary, Musculoskeletal, HENT, Respiratory, Psychiatric, Allergic/Imm, Heme/Lymph Last edited by Annie Paras, COT on 01/30/2022  9:18 AM.     ALLERGIES No Known Allergies  PAST MEDICAL HISTORY Past Medical History:  Diagnosis Date   Cataract    CKD (chronic kidney disease)    Colon polyps    Diabetes ([place])    Diabetic polyneuropathy associated with type 2 diabetes mellitus ([place]) 01/15/2017   Diabetic retinopathy ([place])    GERD (gastroesophageal reflux disease)    Hypercholesterolemia    Hypertension    Hypertensive retinopathy    Hypothyroidism    Polyneuropathy    Proteinuria    History reviewed. No pertinent surgical history.  FAMILY HISTORY Family History  Problem Relation Age of Onset   Stroke Father    Glaucoma Father    SOCIAL HISTORY Social History   Tobacco Use   Smoking status: Former   Smokeless  tobacco: Never  Vaping Use   Vaping Use: Never used  Substance Use Topics   Alcohol use: Yes   Drug use: No       OPHTHALMIC EXAM: Base Eye Exam     Visual Acuity (Snellen - Linear)       Right Left   Dist [place] 20/40 -2 20/40   Dist ph [place] NI NI         Tonometry (Tonopen, 9:23 AM)       Right Left   Pressure 14 15         Pupils       Pupils Dark Light Shape React APD   Right PERRL 3 2 Round Brisk None   Left PERRL 3 2 Round Brisk None         Visual Fields       Left Right    Full Full         Extraocular  Movement       Right Left    Full, Ortho Full, Ortho         Neuro/Psych     Oriented x3: Yes   Mood/Affect: Normal         Dilation     Both eyes: 1.0% Mydriacyl, 2.5% Phenylephrine @ 9:19 AM           Slit Lamp and Fundus Exam     External Exam       Right Left   External Normal Normal         Slit Lamp Exam       Right Left   Lids/Lashes Dermatochalasis - upper lid, mild Telangiectasia Dermatochalasis - upper lid, Meibomian gland dysfunction   Conjunctiva/Sclera White and quiet White and quiet   Cornea 2+ fine, inferior Punctate epithelial erosions 1+ Punctate epithelial erosions, mild tear film debris   Anterior Chamber deep, clear, narrow angles deep, clear, narrow angles   Iris Round and dilated, mild anterior bowing, No NVI Round and dilated, mild anterior bowing, No NVI   Lens 2-3+ Nuclear sclerosis, 2+ Cortical cataract 2-3+ Nuclear sclerosis, 2+ Cortical cataract   Anterior Vitreous Vitreous syneresis Vitreous syneresis         Fundus Exam       Right Left   Disc Pink and Sharp, no NVD Pink and Sharp, no NVD   C/D Ratio 0.3 0.3   Macula good foveal reflex, mild ERM, scattered MA and exudates, persistent central cystic changes / edema -- improved Flat, good foveal reflex, scattered MA and focal exudates, scattered cystic changes -- improved   Vessels attenuated, Tortuous attenuated, Tortuous, Copper wiring, Macroaneurysm / CWS along IT arcades - improving   Periphery Attached, scattered MA greatest posteriorly, focal CWS nasal to disc - improved Attached, scattered MA/DBH greatest posteriorly, focal CWS inferior to IT arcades           IMAGING AND PROCEDURES  Imaging and Procedures for 01/30/2022  OCT, Retina - OU - Both Eyes       Right Eye Quality was good. Central Foveal Thickness: 337. Progression has improved. Findings include no SRF, abnormal foveal contour, intraretinal hyper-reflective material, epiretinal membrane, intraretinal  fluid, macular pucker (Mild interval improvement in IRF / IRHM temporal macula and fovea).   Left Eye Quality was good. Central Foveal Thickness: 297. Progression has improved. Findings include no SRF, abnormal foveal contour, intraretinal hyper-reflective material, intraretinal fluid (Mild interval improvement in IRF/IRHM temporal fovea and macula, Focal IRHM and retinal thickening along IT  arcades -- macroaneurysm -- improved).   Notes *Images captured and stored on drive  Diagnosis / Impression:  +DME OU OD: mild interval improvement in IRF / edema temporal macula and fovea OS: Mild interval improvement in IRF/IRHM temporal fovea and macula, Focal IRHM and retinal thickening along IT arcades -- macroaneurysm -- improved  Clinical management:  See below  Abbreviations: NFP - Normal foveal profile. CME - cystoid macular edema. PED - pigment epithelial detachment. IRF - intraretinal fluid. SRF - subretinal fluid. EZ - ellipsoid zone. ERM - epiretinal membrane. ORA - outer retinal atrophy. ORT - outer retinal tubulation. SRHM - subretinal hyper-reflective material. IRHM - intraretinal hyper-reflective material      Intravitreal Injection, Pharmacologic Agent - OD - Right Eye       Time Out 01/30/2022. 10:11 AM. Confirmed correct patient, procedure, site, and patient consented.   Anesthesia Topical anesthesia was used. Anesthetic medications included Lidocaine 2%, Proparacaine 0.5%.   Procedure Preparation included 5% betadine to ocular surface, eyelid speculum. A (32g) needle was used.   Injection: 2 mg aflibercept 2 MG/0.05ML   Route: Intravitreal, Site: Right Eye   NDC: A3590391, Lot: [id number], Expiration date: 10/24/2022, Waste: 0 mL   Post-op Post injection exam found visual acuity of at least counting fingers. The patient tolerated the procedure well. There were no complications. The patient received written and verbal post procedure care education. Post injection  medications were not given.      Intravitreal Injection, Pharmacologic Agent - OS - Left Eye       Time Out 01/30/2022. 10:12 AM. Confirmed correct patient, procedure, site, and patient consented.   Anesthesia Topical anesthesia was used. Anesthetic medications included Lidocaine 2%, Proparacaine 0.5%.   Procedure Preparation included 5% betadine to ocular surface, eyelid speculum. A (32g) needle was used.   Injection: 2 mg aflibercept 2 MG/0.05ML   Route: Intravitreal, Site: Left Eye   NDC: A3590391, Lot: [id number], Expiration date: 11/24/2022, Waste: 0 mL   Post-op Post injection exam found visual acuity of at least counting fingers. The patient tolerated the procedure well. There were no complications. The patient received written and verbal post procedure care education. Post injection medications were not given.            ASSESSMENT/PLAN:    ICD-10-CM   1. 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 Essential hypertension  I10     4. Hypertensive retinopathy of both eyes  H35.033     5. Combined forms of age-related cataract of both eyes  H25.813      1. Moderate Non-proliferative diabetic retinopathy w/ DME OU  - s/p IVA OD #1 (11.17.22) - s/p IVA OS #1 (11.14.22), #7 (05.12.23), #8 (06.09.23)  - s/p IVA OU #2 (12.15.22), #3 (01.12.23), #4 (02.13.23), #5 (03.16.23), #6 (04.12.23)  - s/p IVE OD #1 (05.12.23) -- sample, #2 (06.09.23) - exam shows scattered MA/DBH OU, +edema and exudates - FA (11.14.22) shows vascular perfusion defects OU, no NV OU, OS with focal macroaneurysm along IT  arcades - OCT shows OD: Mild interval improvement  in IRF / edema temporal macula and fovea; OS: Mild interval improvement in IRF/IRHM temporal fovea and macula, Focal IRHM and retinal thickening along IT arcades -- macroaneurysm -- improved  - BCVA 20/40 OD, 20/40 OS -- stable OU  - discussed possible IVA resistance - recommend IVE OU today (OD #3, OS #1)  07.07.23 - pt wishes to proceed with injections - RBA of procedure discussed, questions answered - IVA informed consent obtained and signed OU, 11.14.22 - IVE informed consent obtained and signed OU, 05.13.23 - see procedure note - f/u in 4 weeks, DFE, OCT, possible injection(s)  2. Retinal macroaneurysm OS  - focal lesion along IT arcades   - s/p IVA OS x8 as above  - OCT shows mild interval improvement  - recommend IVE OS #1 as above  3,4. Hypertensive retinopathy OU - discussed importance of tight BP control - monitor  5.  Mixed Cataract OU  - being monitored by Dr. Delman Cheadle - The symptoms of cataract, surgical options, and treatments and risks were discussed with patient.  - discussed diagnosis and progression - approaching visual significance   Ophthalmic Meds Ordered this visit:  Meds ordered this encounter  Medications   aflibercept (EYLEA) SOLN 2 mg   aflibercept (EYLEA) SOLN 2 mg     Return in about 4 weeks (around 02/27/2022) for f/u NPDR OU, DFE, OCT.  There are no Patient Instructions on file for this visit.   Explained the diagnoses, plan, and follow up with the patient and they expressed understanding.  Patient expressed understanding of the importance of proper follow up care.   This document serves as a record of services personally performed by Gardiner Sleeper, MD, PhD. It was created on their behalf by Leonie Douglas, an ophthalmic technician. The creation of this record is the provider's dictation and/or activities during the visit.    Electronically signed by: Leonie Douglas COA, 01/30/22  3:26 PM  This  document serves as a record of services personally performed by Gardiner Sleeper, MD, PhD. It was created on their behalf by Renaldo Reel, [place] an ophthalmic technician. The creation of this record is the provider's dictation and/or activities during the visit.    Electronically signed by:  Renaldo Reel, COT  01/30/22 3:26 PM  Gardiner Sleeper, M.D., Ph.D. Diseases & Surgery of the Retina and Vitreous [facility]  I have reviewed the above documentation for accuracy and completeness, and I agree with the above. Gardiner Sleeper, M.D., Ph.D. 01/30/22 3:26 PM  Abbreviations: M myopia (nearsighted); A astigmatism; H hyperopia (farsighted); P presbyopia; Mrx spectacle prescription;  CTL contact lenses; OD right eye; OS left eye; OU both eyes  XT exotropia; ET esotropia; PEK punctate epithelial keratitis; PEE punctate epithelial erosions; DES dry eye syndrome; MGD meibomian gland dysfunction; ATs artificial tears; PFAT's preservative free artificial tears; [place] nuclear sclerotic cataract; PSC posterior subcapsular cataract; ERM epi-retinal membrane; PVD posterior vitreous detachment; RD retinal detachment; DM diabetes mellitus; DR diabetic retinopathy; NPDR non-proliferative diabetic retinopathy; PDR proliferative diabetic retinopathy; CSME clinically significant macular edema; DME diabetic macular edema; dbh dot blot hemorrhages; CWS cotton wool spot; POAG primary open angle glaucoma; C/D cup-to-disc ratio; HVF humphrey visual field; GVF goldmann visual field; OCT optical coherence tomography; IOP intraocular pressure; BRVO Branch retinal vein occlusion; CRVO central retinal vein occlusion; CRAO central retinal artery occlusion; BRAO branch retinal artery occlusion; RT retinal tear; SB scleral buckle; PPV pars plana vitrectomy; VH Vitreous hemorrhage; PRP panretinal laser  photocoagulation; IVK intravitreal kenalog; VMT vitreomacular traction; MH Macular hole;  NVD  neovascularization of the disc; NVE neovascularization elsewhere; AREDS age related eye disease study; ARMD age related macular degeneration; POAG primary open angle glaucoma; EBMD epithelial/anterior basement membrane dystrophy; ACIOL anterior chamber intraocular lens; IOL intraocular lens; PCIOL posterior chamber intraocular lens; Phaco/IOL phacoemulsification with intraocular lens placement; [place] photorefractive keratectomy; LASIK laser assisted in situ keratomileusis; HTN hypertension; DM diabetes mellitus; COPD chronic obstructive pulmonary disease

## 2022-01-30 ENCOUNTER — Encounter (INDEPENDENT_AMBULATORY_CARE_PROVIDER_SITE_OTHER): Payer: Self-pay | Admitting: Ophthalmology

## 2022-01-30 ENCOUNTER — Ambulatory Visit (INDEPENDENT_AMBULATORY_CARE_PROVIDER_SITE_OTHER): Payer: PPO | Admitting: Ophthalmology

## 2022-01-30 DIAGNOSIS — I10 Essential (primary) hypertension: Secondary | ICD-10-CM | POA: Diagnosis not present

## 2022-01-30 DIAGNOSIS — H35033 Hypertensive retinopathy, bilateral: Secondary | ICD-10-CM

## 2022-01-30 DIAGNOSIS — E113313 Type 2 diabetes mellitus with moderate nonproliferative diabetic retinopathy with macular edema, bilateral: Secondary | ICD-10-CM

## 2022-01-30 DIAGNOSIS — H35012 Changes in retinal vascular appearance, left eye: Secondary | ICD-10-CM | POA: Diagnosis not present

## 2022-01-30 DIAGNOSIS — H25813 Combined forms of age-related cataract, bilateral: Secondary | ICD-10-CM

## 2022-01-30 MED ORDER — AFLIBERCEPT 2MG/0.05ML IZ SOLN FOR KALEIDOSCOPE
2.0000 mg | INTRAVITREAL | Status: AC | PRN
  Administered 2022-01-30: 2 mg via INTRAVITREAL

## 2022-02-19 DIAGNOSIS — E113213 Type 2 diabetes mellitus with mild nonproliferative diabetic retinopathy with macular edema, bilateral: Secondary | ICD-10-CM | POA: Diagnosis not present

## 2022-02-19 DIAGNOSIS — H5203 Hypermetropia, bilateral: Secondary | ICD-10-CM | POA: Diagnosis not present

## 2022-02-19 DIAGNOSIS — H524 Presbyopia: Secondary | ICD-10-CM | POA: Diagnosis not present

## 2022-02-23 NOTE — Progress Notes (Signed)
[facility] Note  02/27/2022    CHIEF COMPLAINT Patient presents for Retina Follow Up  HISTORY OF PRESENT ILLNESS: Chad [name] is a 76 y.o. male who presents to the clinic today for:   HPI     Retina Follow Up   Patient presents with  Diabetic Retinopathy.  In both eyes.  This started 4 weeks ago.  I, the attending physician,  performed the HPI with the patient and updated documentation appropriately.        Comments   Patient here for 4 weeks retina follow up for NPDR OU. Patient states vision seems to be improving a little bit. No eye pain.       Last edited by Bernarda Caffey, MD on 02/27/2022  1:08 PM.    Pt states vision seems improved   Referring physician: Seward Carol, MD [address],  [place] [address]  HISTORICAL INFORMATION:   Selected notes from the MEDICAL RECORD NUMBER Referred by Dr. Delman Cheadle for diabetic retinopathy with DME OU [name]:  Ocular Hx- PMH-    CURRENT MEDICATIONS: No current outpatient medications on file. (Ophthalmic Drugs)   No current facility-administered medications for this visit. (Ophthalmic Drugs)   Current Outpatient Medications (Other)  Medication Sig   B-D ULTRAFINE III SHORT PEN 31G X 8 MM MISC SMARTSIG:1 Each SUB-Q Daily   Calcium Carb-Cholecalciferol (CALCIUM 500 + D) 500-125 MG-UNIT TABS Take 1 tablet by mouth daily.   gabapentin (NEURONTIN) 300 MG capsule TAKE 1 CAPSULE EVERY MORNING, 1 CAPSULE EVERY AFTERNOON AND 2 CAPSULES AT BEDTIME   insulin glargine (LANTUS) 100 UNIT/ML injection Inject 10 Units into the skin daily.   JARDIANCE 10 MG TABS tablet Take 10 mg by mouth daily.   levothyroxine (SYNTHROID, LEVOTHROID) 75 MCG tablet Take 75 mcg by mouth daily before breakfast.    losartan (COZAAR) 25 MG tablet Take 25 mg by mouth daily. bid   losartan-hydrochlorothiazide (HYZAAR) 100-25 MG tablet Take 1 tablet by mouth daily.   metoprolol succinate (TOPROL-XL) 25 MG 24 hr tablet  TAKE ONE TABLET BY MOUTH DAILY   nortriptyline (PAMELOR) 10 MG capsule TAKE THREE CAPSULES BY MOUTH EVERY NIGHT AT BEDTIME   ONETOUCH VERIO test strip    rosuvastatin (CRESTOR) 40 MG tablet Take 1 tablet by mouth daily.   No current facility-administered medications for this visit. (Other)   REVIEW OF SYSTEMS: ROS   Positive for: Endocrine, Cardiovascular, Eyes Negative for: Constitutional, Gastrointestinal, Neurological, Skin, Genitourinary, Musculoskeletal, HENT, Respiratory, Psychiatric, Allergic/Imm, Heme/Lymph Last edited by Theodore Demark, COA on 02/27/2022 10:21 AM.     ALLERGIES No Known Allergies  PAST MEDICAL HISTORY Past Medical History:  Diagnosis Date   Cataract    CKD (chronic kidney disease)    Colon polyps    Diabetes ([place])    Diabetic polyneuropathy associated with type 2 diabetes mellitus ([place]) 01/15/2017   Diabetic retinopathy ([place])    GERD (gastroesophageal reflux disease)    Hypercholesterolemia    Hypertension    Hypertensive retinopathy    Hypothyroidism    Polyneuropathy    Proteinuria    History reviewed. No pertinent surgical history.  FAMILY HISTORY Family History  Problem Relation Age of Onset   Stroke Father    Glaucoma Father    SOCIAL HISTORY Social History   Tobacco Use   Smoking status: Former   Smokeless tobacco: Never  Scientific laboratory technician Use: Never used  Substance Use Topics  Alcohol use: Yes   Drug use: No       OPHTHALMIC EXAM: Base Eye Exam     Visual Acuity (Snellen - Linear)       Right Left   Dist [place] 20/40 -2 20/50 -2   Dist ph [place] 20/30 -1 20/30 +2         Tonometry (Tonopen, 10:19 AM)       Right Left   Pressure 13 14         Pupils       Dark Light Shape React APD   Right 3 2 Round Brisk None   Left 3 2 Round Brisk None         Visual Fields (Counting fingers)       Left Right    Full Full         Extraocular Movement       Right Left    Full, Ortho Full, Ortho          Neuro/Psych     Oriented x3: Yes   Mood/Affect: Normal         Dilation     Both eyes: 1.0% Mydriacyl, 2.5% Phenylephrine @ 10:19 AM           Slit Lamp and Fundus Exam     External Exam       Right Left   External Normal Normal         Slit Lamp Exam       Right Left   Lids/Lashes Dermatochalasis - upper lid, mild Telangiectasia Dermatochalasis - upper lid, Meibomian gland dysfunction   Conjunctiva/Sclera White and quiet White and quiet   Cornea 2+ fine, inferior Punctate epithelial erosions 1+ Punctate epithelial erosions, mild tear film debris   Anterior Chamber deep, clear, narrow angles deep, clear, narrow angles   Iris Round and dilated, mild anterior bowing, No NVI Round and dilated, mild anterior bowing, No NVI   Lens 2-3+ Nuclear sclerosis, 2+ Cortical cataract 2-3+ Nuclear sclerosis, 2+ Cortical cataract   Anterior Vitreous Vitreous syneresis Vitreous syneresis         Fundus Exam       Right Left   Disc Pink and Sharp, no NVD Pink and Sharp, no NVD   C/D Ratio 0.3 0.3   Macula good foveal reflex, mild ERM, scattered MA and exudates, persistent central cystic changes / edema -- slightly improved Flat, good foveal reflex, scattered MA and focal exudates, scattered cystic changes   Vessels attenuated, Tortuous attenuated, Tortuous, Copper wiring, Macroaneurysm / CWS along IT arcades - improving   Periphery Attached, scattered MA and CWS greatest posteriorly Attached, scattered MA/DBH greatest posteriorly, focal CWS inferior to IT arcades           IMAGING AND PROCEDURES  Imaging and Procedures for 02/27/2022  OCT, Retina - OU - Both Eyes       Right Eye Quality was good. Central Foveal Thickness: 333. Progression has improved. Findings include no SRF, abnormal foveal contour, intraretinal hyper-reflective material, epiretinal membrane, intraretinal fluid, macular pucker (Mild interval improvement in IRF / IRHM temporal macula and fovea).   Left  Eye Quality was good. Central Foveal Thickness: 291. Progression has been stable. Findings include no SRF, abnormal foveal contour, intraretinal hyper-reflective material, intraretinal fluid (persistent IRF/IRHM temporal fovea and macula -- slightly increased, Focal IRHM and retinal thickening along IT arcades -- macroaneurysm -- improved).   Notes *Images captured and stored on drive  Diagnosis / Impression:  +DME  OU OD: mild interval improvement in IRF / edema temporal macula and fovea OS: persistent IRF/IRHM temporal fovea and macula -- slightly increased, Focal IRHM and retinal thickening along IT arcades -- macroaneurysm -- improved  Clinical management:  See below  Abbreviations: NFP - Normal foveal profile. CME - cystoid macular edema. PED - pigment epithelial detachment. IRF - intraretinal fluid. SRF - subretinal fluid. EZ - ellipsoid zone. ERM - epiretinal membrane. ORA - outer retinal atrophy. ORT - outer retinal tubulation. SRHM - subretinal hyper-reflective material. IRHM - intraretinal hyper-reflective material      Intravitreal Injection, Pharmacologic Agent - OD - Right Eye       Time Out 02/27/2022. 10:50 AM. Confirmed correct patient, procedure, site, and patient consented.   Anesthesia Topical anesthesia was used. Anesthetic medications included Lidocaine 2%, Proparacaine 0.5%.   Procedure Preparation included 5% betadine to ocular surface, eyelid speculum. A (32g) needle was used.   Injection: 2 mg aflibercept 2 MG/0.05ML   Route: Intravitreal, Site: Right Eye   NDC: A3590391, Lot: [id number], Expiration date: 04/26/2023, Waste: 0 mL   Post-op Post injection exam found visual acuity of at least counting fingers. The patient tolerated the procedure well. There were no complications. The patient received written and verbal post procedure care education. Post injection medications were not given.      Intravitreal Injection, Pharmacologic Agent - OS - Left  Eye       Time Out 02/27/2022. 10:51 AM. Confirmed correct patient, procedure, site, and patient consented.   Anesthesia Topical anesthesia was used. Anesthetic medications included Lidocaine 2%, Proparacaine 0.5%.   Procedure Preparation included 5% betadine to ocular surface, eyelid speculum. A (32g) needle was used.   Injection: 2 mg aflibercept 2 MG/0.05ML   Route: Intravitreal, Site: Left Eye   NDC: A3590391, Lot: [id number], Expiration date: 11/24/2022, Waste: 0 mL   Post-op Post injection exam found visual acuity of at least counting fingers. The patient tolerated the procedure well. There were no complications. The patient received written and verbal post procedure care education. Post injection medications were not given.            ASSESSMENT/PLAN:    ICD-10-CM   1. 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 Essential hypertension  I10     4. Hypertensive retinopathy of both eyes  H35.033     5. Combined forms of age-related cataract of both eyes  H25.813      1. Moderate Non-proliferative diabetic retinopathy w/ DME OU  - s/p IVA OD #1 (11.17.22) - s/p IVA OS #1 (11.14.22), #7 (05.12.23), #8 (06.09.23)  - s/p IVA OU #2 (12.15.22), #3 (01.12.23), #4 (02.13.23), #5 (03.16.23), #6 (04.12.23)  **IVA resistance OU**  - s/p IVE OD #1 (05.12.23) -- sample, #2 (06.09.23), #3 (07.07.23)  - s/p IVE OS #1 (07.07.23) - exam shows scattered MA/DBH OU, +edema and exudates - FA (11.14.22) shows vascular perfusion defects OU, no NV OU, OS with focal macroaneurysm along IT arcades - OCT shows OD: mild  interval improvement in IRF / edema temporal macula and  fovea; OS: persistent IRF/IRHM temporal fovea and macula -- slightly increased, Focal IRHM and retinal thickening along IT arcades -- macroaneurysm -- improved  - BCVA 20/30 OU -- improved - recommend IVE OU today (OD #4, OS #2)  08.04.23 - pt wishes to proceed with injections - RBA of procedure discussed, questions answered - IVA informed consent obtained and signed OU, 11.14.22 - IVE informed consent obtained and signed OU, 05.13.23 - see procedure note - f/u in 4 weeks, DFE, OCT, possible injection(s)  2. Retinal macroaneurysm OS  - focal lesion along IT arcades   - s/p IVA and IVE OS as above  - OCT shows mild interval improvement  - recommend IVE OS #2 as above  3,4. Hypertensive retinopathy OU - discussed importance of tight BP control - monitor  5.  Mixed Cataract OU  - being monitored by Dr. Delman Cheadle - The symptoms of cataract, surgical options, and treatments and risks were discussed with patient.  - discussed diagnosis and progression - approaching visual significance   Ophthalmic Meds Ordered this visit:  Meds ordered this encounter  Medications   aflibercept (EYLEA) SOLN 2 mg   aflibercept (EYLEA) SOLN 2 mg     Return in about 4 weeks (around 03/27/2022) for f/u NPDR OU, DFE, OCT.  There are no Patient Instructions on file for this visit.   Explained the diagnoses, plan, and follow up with the patient and they expressed understanding.  Patient expressed understanding of the importance of proper follow up care.   This document serves as a record of services personally performed by Gardiner Sleeper, MD, PhD. It was created on their behalf by San Jetty. Owens Shark, OA an ophthalmic technician. The creation of this record is the provider's dictation and/or activities during the visit.    Electronically signed by: San Jetty. Kevin, [place] 07.31.2023 1:10 PM   Gardiner Sleeper, M.D., Ph.D. Diseases & Surgery of the Retina and  Vitreous [facility]  I have reviewed the above documentation for accuracy and completeness, and I agree with the above. Gardiner Sleeper, M.D., Ph.D. 02/27/22 1:12 PM   Abbreviations: M myopia (nearsighted); A astigmatism; H hyperopia (farsighted); P presbyopia; Mrx spectacle prescription;  CTL contact lenses; OD right eye; OS left eye; OU both eyes  XT exotropia; ET esotropia; PEK punctate epithelial keratitis; PEE punctate epithelial erosions; DES dry eye syndrome; MGD meibomian gland dysfunction; ATs artificial tears; PFAT's preservative free artificial tears; [place] nuclear sclerotic cataract; PSC posterior subcapsular cataract; ERM epi-retinal membrane; PVD posterior vitreous detachment; RD retinal detachment; DM diabetes mellitus; DR diabetic retinopathy; NPDR non-proliferative diabetic retinopathy; PDR proliferative diabetic retinopathy; CSME clinically significant macular edema; DME diabetic macular edema; dbh dot blot hemorrhages; CWS cotton wool spot; POAG primary open angle glaucoma; C/D cup-to-disc ratio; HVF humphrey visual field; GVF goldmann visual field; OCT optical coherence tomography; IOP intraocular pressure; BRVO Branch retinal vein occlusion; CRVO central retinal vein occlusion; CRAO central retinal artery occlusion; BRAO branch retinal artery occlusion; RT retinal tear; SB scleral buckle; PPV pars plana vitrectomy; VH Vitreous hemorrhage; PRP panretinal laser photocoagulation; IVK intravitreal kenalog; VMT vitreomacular traction; MH Macular hole;  NVD neovascularization of the disc; NVE neovascularization elsewhere; AREDS age related eye disease study; ARMD age related macular degeneration; POAG primary open angle glaucoma; EBMD epithelial/anterior basement membrane dystrophy; ACIOL anterior chamber intraocular lens; IOL intraocular lens; PCIOL posterior chamber intraocular lens; Phaco/IOL phacoemulsification with intraocular lens placement; [place] photorefractive  keratectomy; LASIK laser assisted in situ keratomileusis;  HTN hypertension; DM diabetes mellitus; COPD chronic obstructive pulmonary disease

## 2022-02-26 DIAGNOSIS — L578 Other skin changes due to chronic exposure to nonionizing radiation: Secondary | ICD-10-CM | POA: Diagnosis not present

## 2022-02-27 ENCOUNTER — Encounter (INDEPENDENT_AMBULATORY_CARE_PROVIDER_SITE_OTHER): Payer: Self-pay | Admitting: Ophthalmology

## 2022-02-27 ENCOUNTER — Ambulatory Visit (INDEPENDENT_AMBULATORY_CARE_PROVIDER_SITE_OTHER): Payer: PPO | Admitting: Ophthalmology

## 2022-02-27 DIAGNOSIS — H25813 Combined forms of age-related cataract, bilateral: Secondary | ICD-10-CM

## 2022-02-27 DIAGNOSIS — H35033 Hypertensive retinopathy, bilateral: Secondary | ICD-10-CM | POA: Diagnosis not present

## 2022-02-27 DIAGNOSIS — H35012 Changes in retinal vascular appearance, left eye: Secondary | ICD-10-CM

## 2022-02-27 DIAGNOSIS — E113313 Type 2 diabetes mellitus with moderate nonproliferative diabetic retinopathy with macular edema, bilateral: Secondary | ICD-10-CM

## 2022-02-27 DIAGNOSIS — I10 Essential (primary) hypertension: Secondary | ICD-10-CM

## 2022-02-27 MED ORDER — AFLIBERCEPT 2MG/0.05ML IZ SOLN FOR KALEIDOSCOPE
2.0000 mg | INTRAVITREAL | Status: AC | PRN
  Administered 2022-02-27: 2 mg via INTRAVITREAL

## 2022-02-28 ENCOUNTER — Encounter (INDEPENDENT_AMBULATORY_CARE_PROVIDER_SITE_OTHER): Payer: Self-pay | Admitting: Ophthalmology

## 2022-03-06 DIAGNOSIS — N1832 Chronic kidney disease, stage 3b: Secondary | ICD-10-CM | POA: Diagnosis not present

## 2022-03-06 DIAGNOSIS — R809 Proteinuria, unspecified: Secondary | ICD-10-CM | POA: Diagnosis not present

## 2022-03-06 DIAGNOSIS — I10 Essential (primary) hypertension: Secondary | ICD-10-CM | POA: Diagnosis not present

## 2022-03-06 DIAGNOSIS — Q253 Supravalvular aortic stenosis: Secondary | ICD-10-CM | POA: Diagnosis not present

## 2022-03-06 DIAGNOSIS — E7800 Pure hypercholesterolemia, unspecified: Secondary | ICD-10-CM | POA: Diagnosis not present

## 2022-03-06 DIAGNOSIS — E113313 Type 2 diabetes mellitus with moderate nonproliferative diabetic retinopathy with macular edema, bilateral: Secondary | ICD-10-CM | POA: Diagnosis not present

## 2022-03-06 DIAGNOSIS — E11319 Type 2 diabetes mellitus with unspecified diabetic retinopathy without macular edema: Secondary | ICD-10-CM | POA: Diagnosis not present

## 2022-03-09 IMAGING — US US RENAL
2 series · 14 of 25 positions shown · non-contrast
Comparison: 10/18/2014

CLINICAL DATA: Acute kidney injury on chronic kidney disease,
diabetes

EXAM:
RENAL / URINARY TRACT ULTRASOUND COMPLETE

[Series 1: us renal · 0.26mm/px · 13 of 40 slices shown (1 of 2)]
[im 1/40]
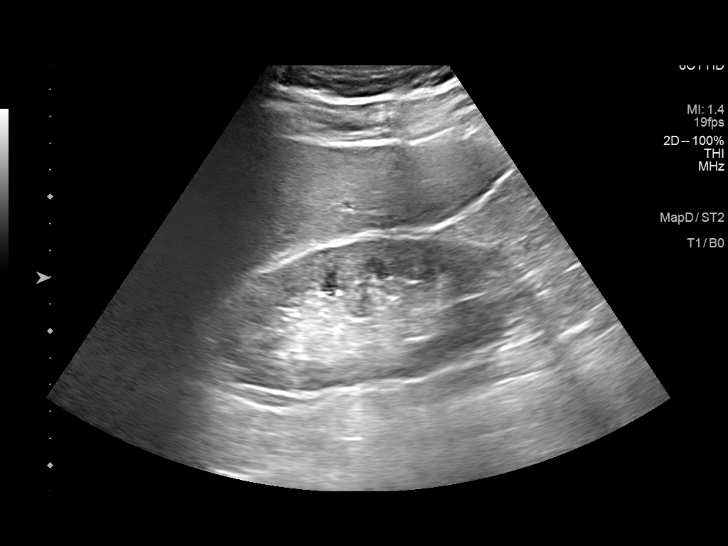
[im 4/40]
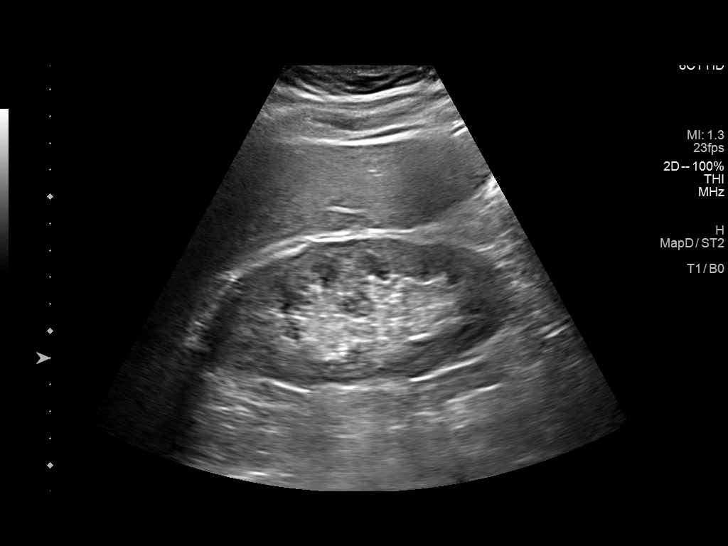
[im 8/40]
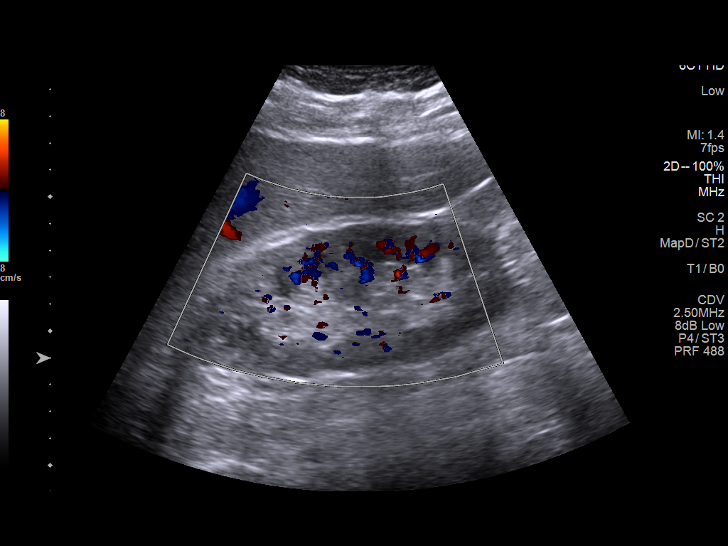
[im 11/40]
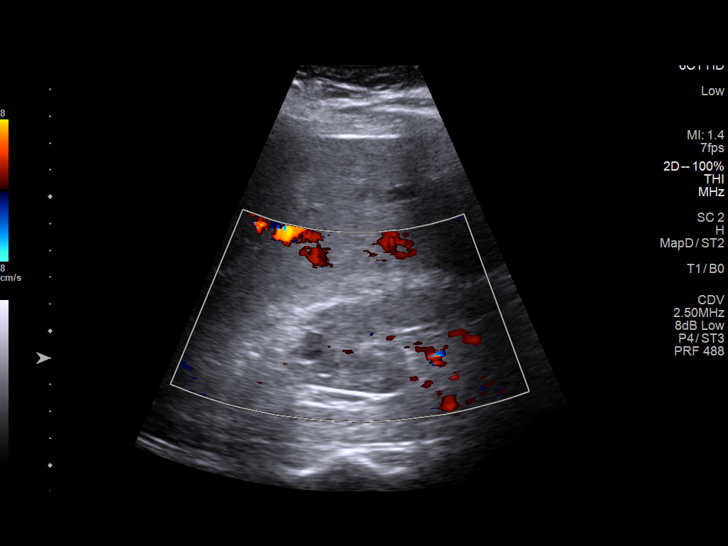
[im 15/40]
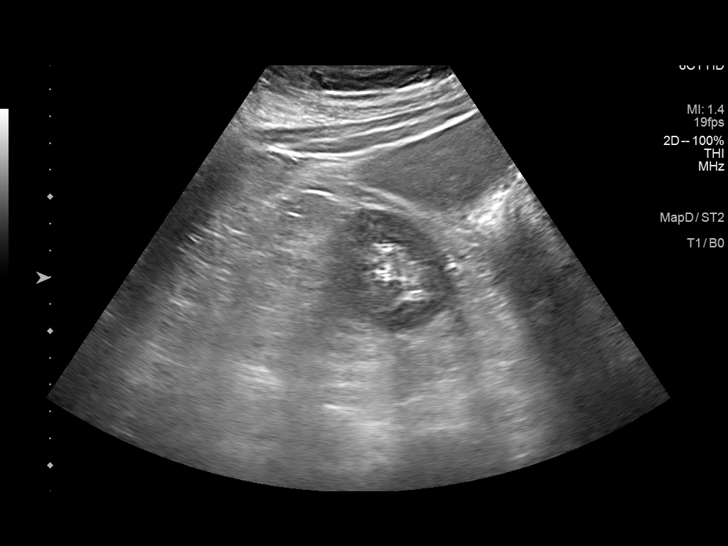
[im 16/40]
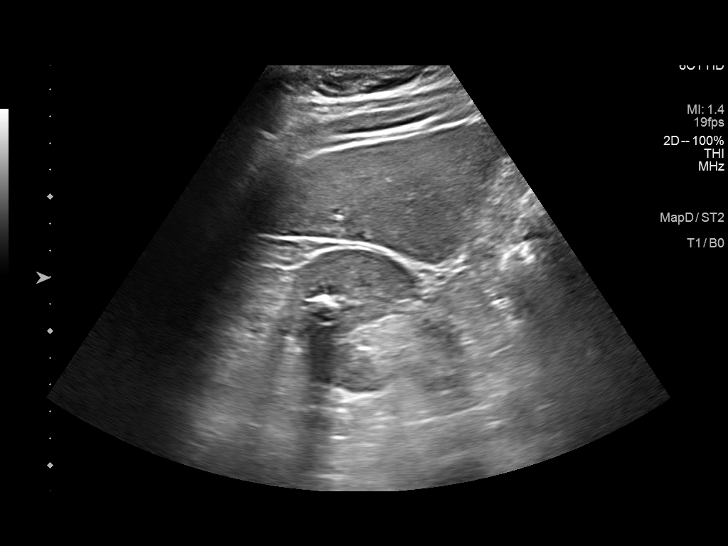
[im 20/40]
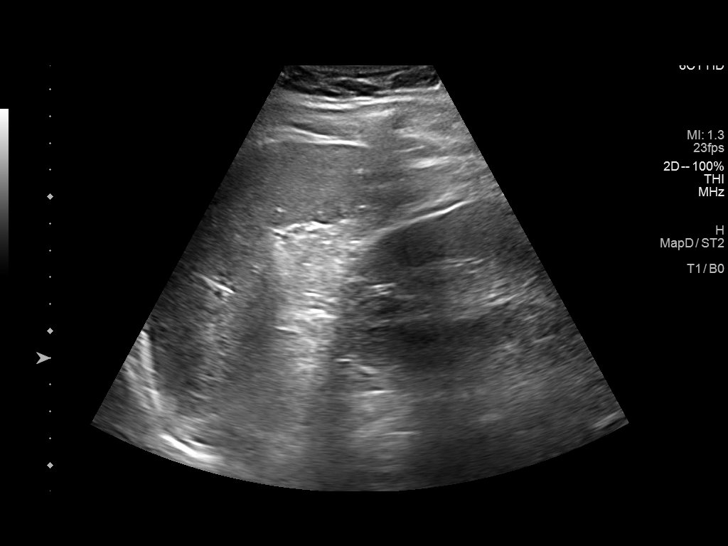
[im 24/40]
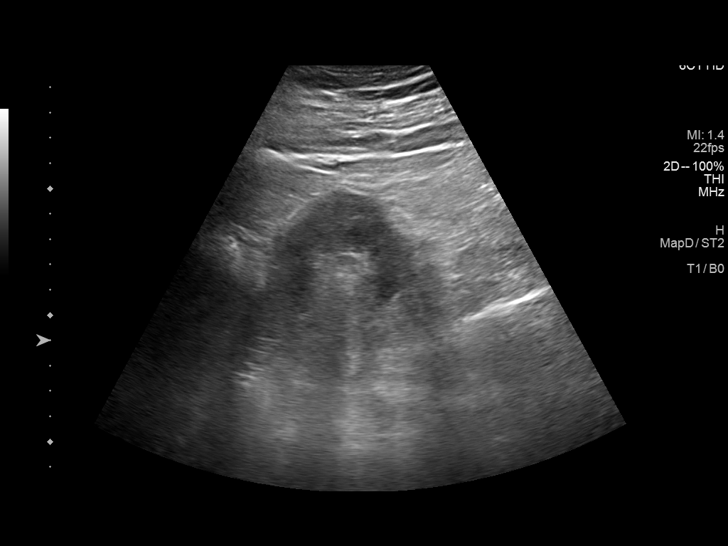
[im 27/40]
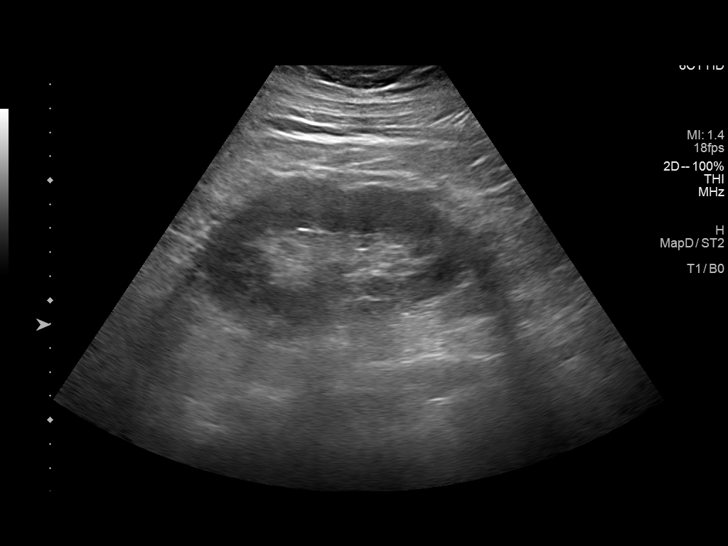
[im 29/40]
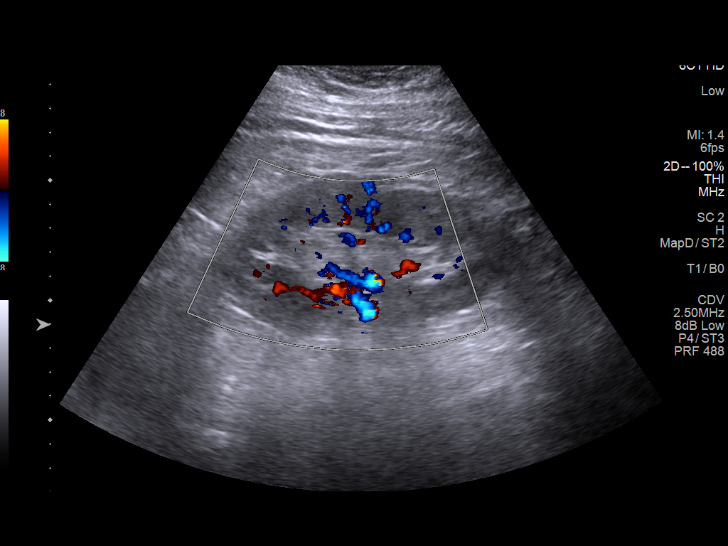
[im 32/40]
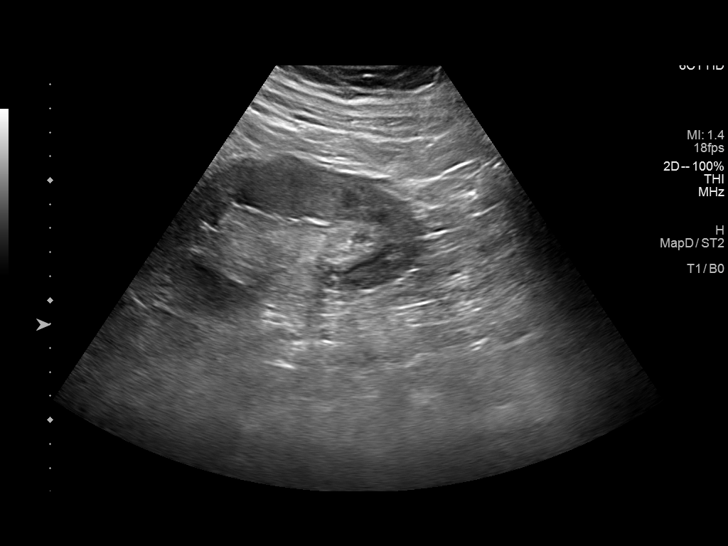
[im 36/40]
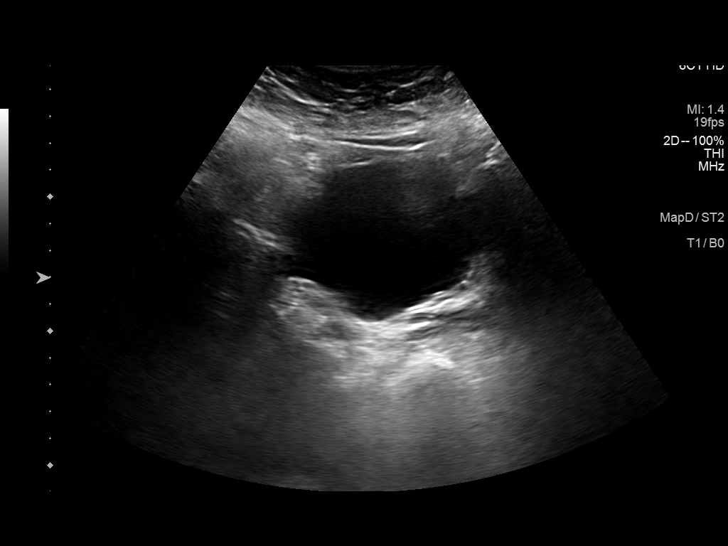
[im 40/40]
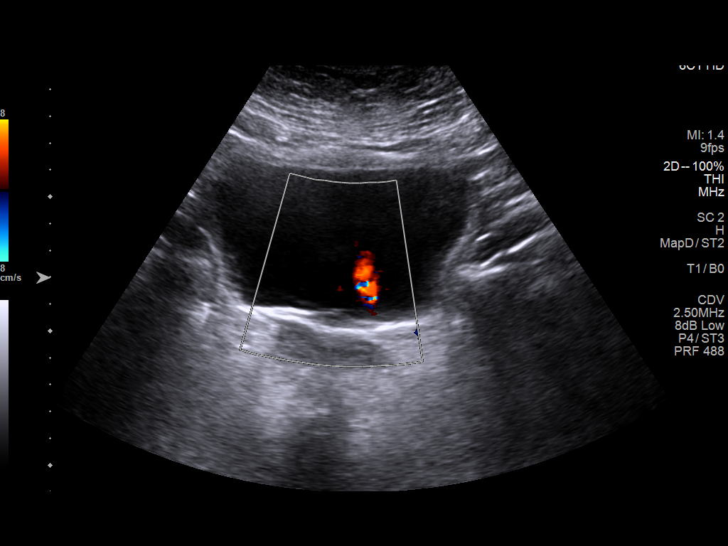

[Series 2001: us renal · 0.26mm/px · 1 of 4 slices shown (2 of 2)]
[im 4/4]
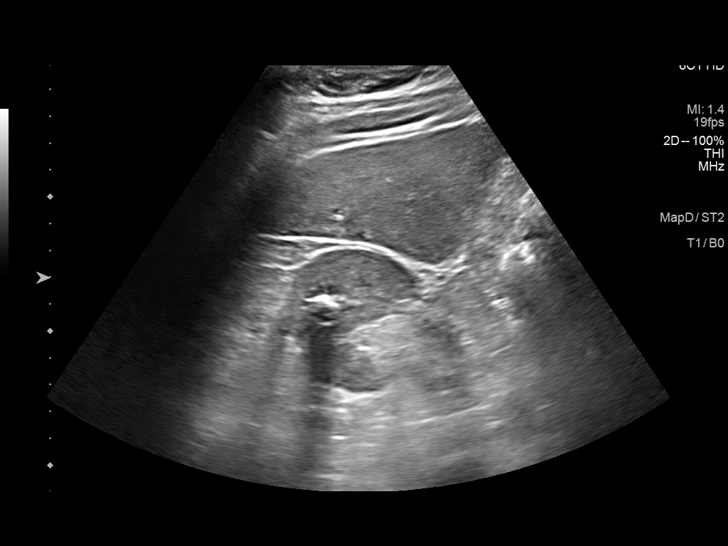

[14 of 25 positions shown; findings below may reference images not displayed]

FINDINGS: Right Kidney:

Renal measurements: Pain 11.9 x 5.5 x 5.5 cm = volume: 186 mL.
Increased cortical echogenicity suggesting medical renal disease.
Nonobstructing midpole shadowing echogenic calculus measures 1.1 cm.
Upper pole hypoechoic cortical cyst measures 1.1 cm.

Left Kidney:

Renal measurements: 11.76.6 x 5.6 cm = volume: 225 mL. Normal
echogenicity. No acute finding or hydronephrosis. No focal
abnormality.

Bladder:

Appears normal for degree of bladder distention. Ureteral jets
demonstrated bilaterally.

Other:

None.
IMPRESSION: Right kidney increased echogenicity compatible with medical renal
disease.

Nonobstructing right mid pole nephrolithiasis measuring 1.1 cm.

Incidental 1.1 cm right kidney upper pole cyst.

No acute left renal abnormality.

## 2022-03-11 DIAGNOSIS — N1832 Chronic kidney disease, stage 3b: Secondary | ICD-10-CM | POA: Diagnosis not present

## 2022-03-17 DIAGNOSIS — E039 Hypothyroidism, unspecified: Secondary | ICD-10-CM | POA: Diagnosis not present

## 2022-03-17 DIAGNOSIS — N1832 Chronic kidney disease, stage 3b: Secondary | ICD-10-CM | POA: Diagnosis not present

## 2022-03-17 DIAGNOSIS — E785 Hyperlipidemia, unspecified: Secondary | ICD-10-CM | POA: Diagnosis not present

## 2022-03-17 DIAGNOSIS — E1122 Type 2 diabetes mellitus with diabetic chronic kidney disease: Secondary | ICD-10-CM | POA: Diagnosis not present

## 2022-03-17 DIAGNOSIS — R011 Cardiac murmur, unspecified: Secondary | ICD-10-CM | POA: Diagnosis not present

## 2022-03-17 DIAGNOSIS — I10 Essential (primary) hypertension: Secondary | ICD-10-CM | POA: Diagnosis not present

## 2022-03-24 ENCOUNTER — Other Ambulatory Visit: Payer: Self-pay | Admitting: Cardiovascular Disease

## 2022-04-03 ENCOUNTER — Encounter (INDEPENDENT_AMBULATORY_CARE_PROVIDER_SITE_OTHER): Payer: PPO | Admitting: Ophthalmology

## 2022-04-03 DIAGNOSIS — H35033 Hypertensive retinopathy, bilateral: Secondary | ICD-10-CM

## 2022-04-03 DIAGNOSIS — H25813 Combined forms of age-related cataract, bilateral: Secondary | ICD-10-CM

## 2022-04-03 DIAGNOSIS — I10 Essential (primary) hypertension: Secondary | ICD-10-CM

## 2022-04-03 DIAGNOSIS — E113313 Type 2 diabetes mellitus with moderate nonproliferative diabetic retinopathy with macular edema, bilateral: Secondary | ICD-10-CM

## 2022-04-03 DIAGNOSIS — H35012 Changes in retinal vascular appearance, left eye: Secondary | ICD-10-CM

## 2022-04-10 NOTE — Progress Notes (Signed)
[facility] Note  04/17/2022    CHIEF COMPLAINT Patient presents for Retina Follow Up  HISTORY OF PRESENT ILLNESS: Chad [name] is a 76 y.o. male who presents to the clinic today for:   HPI     Retina Follow Up   Patient presents with  Diabetic Retinopathy.  In both eyes.  Severity is moderate.  Duration of 7 weeks.  Since onset it is stable.  I, the attending physician,  performed the HPI with the patient and updated documentation appropriately.        Comments   Pt here for 7 wk ret f/u NPDR OU. Pt states VA is the same, no change.       Last edited by Bernarda Caffey, MD on 04/17/2022  3:28 PM.     Pt is delayed to follow up from 4 weeks to 7 weeks due to having covid, pt states vision is stable  Referring physician: Seward Carol, MD [address],  [place] [address]  HISTORICAL INFORMATION:   Selected notes from the MEDICAL RECORD NUMBER Referred by Dr. Delman Cheadle for diabetic retinopathy with DME OU [name]:  Ocular Hx- PMH-    CURRENT MEDICATIONS: No current outpatient medications on file. (Ophthalmic Drugs)   No current facility-administered medications for this visit. (Ophthalmic Drugs)   Current Outpatient Medications (Other)  Medication Sig   B-D ULTRAFINE III SHORT PEN 31G X 8 MM MISC SMARTSIG:1 Each SUB-Q Daily   Calcium Carb-Cholecalciferol (CALCIUM 500 + D) 500-125 MG-UNIT TABS Take 1 tablet by mouth daily.   gabapentin (NEURONTIN) 300 MG capsule TAKE 1 CAPSULE EVERY MORNING, 1 CAPSULE EVERY AFTERNOON AND 2 CAPSULES AT BEDTIME   insulin glargine (LANTUS) 100 UNIT/ML injection Inject 10 Units into the skin daily.   JARDIANCE 10 MG TABS tablet Take 10 mg by mouth daily.   levothyroxine (SYNTHROID, LEVOTHROID) 75 MCG tablet Take 75 mcg by mouth daily before breakfast.    losartan (COZAAR) 25 MG tablet Take 25 mg by mouth daily. bid   losartan-hydrochlorothiazide (HYZAAR) 100-25 MG tablet Take 1 tablet by mouth  daily.   metoprolol succinate (TOPROL-XL) 25 MG 24 hr tablet TAKE ONE TABLET BY MOUTH DAILY   nortriptyline (PAMELOR) 10 MG capsule TAKE THREE CAPSULES BY MOUTH EVERY NIGHT AT BEDTIME   ONETOUCH VERIO test strip    rosuvastatin (CRESTOR) 40 MG tablet Take 1 tablet by mouth daily.   No current facility-administered medications for this visit. (Other)   REVIEW OF SYSTEMS: ROS   Positive for: Endocrine, Cardiovascular, Eyes Negative for: Constitutional, Gastrointestinal, Neurological, Skin, Genitourinary, Musculoskeletal, HENT, Respiratory, Psychiatric, Allergic/Imm, Heme/Lymph Last edited by Kingsley Spittle, COT on 04/17/2022  9:21 AM.      ALLERGIES No Known Allergies  PAST MEDICAL HISTORY Past Medical History:  Diagnosis Date   Cataract    CKD (chronic kidney disease)    Colon polyps    Diabetes ([place])    Diabetic polyneuropathy associated with type 2 diabetes mellitus ([place]) 01/15/2017   Diabetic retinopathy ([place])    GERD (gastroesophageal reflux disease)    Hypercholesterolemia    Hypertension    Hypertensive retinopathy    Hypothyroidism    Polyneuropathy    Proteinuria    History reviewed. No pertinent surgical history.  FAMILY HISTORY Family History  Problem Relation Age of Onset   Stroke Father    Glaucoma Father    SOCIAL HISTORY Social History   Tobacco Use   Smoking status:  Former   Smokeless tobacco: Never  Scientific laboratory technician Use: Never used  Substance Use Topics   Alcohol use: Yes   Drug use: No       OPHTHALMIC EXAM: Base Eye Exam     Visual Acuity (Snellen - Linear)       Right Left   Dist [place] 20/40 -1 20/50 -1   Dist ph [place] 20/30 -2 20/25 -2         Tonometry (Tonopen, 9:27 AM)       Right Left   Pressure 10 13         Pupils       Dark Light Shape React APD   Right 3 2 Round Brisk None   Left 3 2 Round Brisk None         Visual Fields (Counting fingers)       Left Right    Full Full         Extraocular  Movement       Right Left    Full, Ortho Full, Ortho         Neuro/Psych     Oriented x3: Yes   Mood/Affect: Normal         Dilation     Both eyes: 1.0% Mydriacyl, 2.5% Phenylephrine @ 9:28 AM           Slit Lamp and Fundus Exam     External Exam       Right Left   External Normal Normal         Slit Lamp Exam       Right Left   Lids/Lashes Dermatochalasis - upper lid, mild Telangiectasia Dermatochalasis - upper lid, Meibomian gland dysfunction   Conjunctiva/Sclera White and quiet White and quiet   Cornea 2+ fine, inferior Punctate epithelial erosions 1+ Punctate epithelial erosions, mild tear film debris   Anterior Chamber deep, clear, narrow angles deep, clear, narrow angles   Iris Round and dilated, mild anterior bowing, No NVI Round and dilated, mild anterior bowing, No NVI   Lens 2-3+ Nuclear sclerosis, 2+ Cortical cataract 2-3+ Nuclear sclerosis, 2+ Cortical cataract   Anterior Vitreous Vitreous syneresis Vitreous syneresis         Fundus Exam       Right Left   Disc Pink and Sharp, no NVD Pink and Sharp, no NVD   C/D Ratio 0.3 0.3   Macula good foveal reflex, mild ERM, scattered MA and exudates, persistent central cystic changes / edema -- slightly improved, light CWS IT mac Flat, good foveal reflex, scattered MA and focal exudates, scattered cystic changes -- improved   Vessels attenuated, Tortuous attenuated, Tortuous, Copper wiring, Macroaneurysm / CWS along IT arcades - improving   Periphery Attached, scattered MA and CWS greatest posteriorly Attached, scattered MA/DBH greatest posteriorly, focal CWS inferior to IT arcades           IMAGING AND PROCEDURES  Imaging and Procedures for 04/17/2022  OCT, Retina - OU - Both Eyes       Right Eye Quality was good. Central Foveal Thickness: 305. Progression has improved. Findings include no SRF, abnormal foveal contour, intraretinal hyper-reflective material, epiretinal membrane, intraretinal  fluid, macular pucker (Mild interval improvement in IRF / IRHM temporal macula and fovea).   Left Eye Quality was good. Central Foveal Thickness: 291. Progression has improved. Findings include no SRF, abnormal foveal contour, intraretinal hyper-reflective material, intraretinal fluid (Interval improvement IRF/IRHM temporal fovea and macula, focal IRHM and  retinal thickening along IT arcades -- macroaneurysm -- slightly improved).   Notes *Images captured and stored on drive  Diagnosis / Impression:  +DME OU OD: mild interval improvement in IRF / edema temporal macula and fovea OS: Interval improvement IRF/IRHM temporal fovea and macula, focal IRHM and retinal thickening along IT arcades -- macroaneurysm -- slightly improved  Clinical management:  See below  Abbreviations: NFP - Normal foveal profile. CME - cystoid macular edema. PED - pigment epithelial detachment. IRF - intraretinal fluid. SRF - subretinal fluid. EZ - ellipsoid zone. ERM - epiretinal membrane. ORA - outer retinal atrophy. ORT - outer retinal tubulation. SRHM - subretinal hyper-reflective material. IRHM - intraretinal hyper-reflective material      Intravitreal Injection, Pharmacologic Agent - OD - Right Eye       Time Out 04/17/2022. 10:02 AM. Confirmed correct patient, procedure, site, and patient consented.   Anesthesia Topical anesthesia was used. Anesthetic medications included Lidocaine 2%, Proparacaine 0.5%.   Procedure Preparation included 5% betadine to ocular surface, eyelid speculum. A (32g) needle was used.   Injection: 2 mg aflibercept 2 MG/0.05ML   Route: Intravitreal, Site: Right Eye   NDC: A3590391, Lot: [id number], Expiration date: 04/26/2023, Waste: 0 mL   Post-op Post injection exam found visual acuity of at least counting fingers. The patient tolerated the procedure well. There were no complications. The patient received written and verbal post procedure care education. Post injection  medications were not given.      Intravitreal Injection, Pharmacologic Agent - OS - Left Eye       Time Out 04/17/2022. 10:06 AM. Confirmed correct patient, procedure, site, and patient consented.   Anesthesia Topical anesthesia was used. Anesthetic medications included Lidocaine 2%, Proparacaine 0.5%.   Procedure Preparation included 5% betadine to ocular surface, eyelid speculum. A (32g) needle was used.   Injection: 2 mg aflibercept 2 MG/0.05ML   Route: Intravitreal, Site: Left Eye   NDC: A3590391, Lot: [id number], Expiration date: 12/24/2022, Waste: 0 mL   Post-op Post injection exam found visual acuity of at least counting fingers. The patient tolerated the procedure well. There were no complications. The patient received written and verbal post procedure care education. Post injection medications were not given.            ASSESSMENT/PLAN:    ICD-10-CM   1. 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 Essential hypertension  I10     4. Hypertensive retinopathy of both eyes  H35.033     5. Combined forms of age-related cataract of both eyes  H25.813      1. Moderate Non-proliferative diabetic retinopathy w/ DME OU  - pt is delayed to follow up from 4 weeks to 7 weeks due to having Covid  - s/p IVA OD #1 (11.17.22) - s/p IVA OS #1 (11.14.22), #7 (05.12.23), #8 (06.09.23)  - s/p IVA OU #2 (12.15.22), #3 (01.12.23), #4 (02.13.23), #5 (03.16.23), #6 (04.12.23)  **IVA resistance OU**  - s/p IVE OD #1 (05.12.23) -- sample, #2 (06.09.23), #3 (07.07.23), #4 (08.04.23)  - s/p IVE OS #  1 (07.07.23), #2  (08.04.23) - exam shows scattered MA/DBH OU, +edema and exudates - FA (11.14.22) shows vascular perfusion defects OU, no NV OU, OS with focal macroaneurysm along IT arcades - OCT shows OD: mild interval improvement in IRF / edema temporal macula and fovea; OS: Interval improvement IRF/IRHM temporal fovea and macula, focal IRHM and retinal thickening along IT arcades -- macroaneurysm -- slightly improved at 7 weeks  - BCVA 20/30 OD -- stable, OS: 20/25 - recommend IVE OU today (OD #5, OS #3) 09.22.23 with f/u at 6 weeks - pt wishes to proceed with injections - RBA of procedure discussed, questions answered - IVA informed consent obtained and signed OU, 11.14.22 - IVE informed consent obtained and signed OU, 05.13.23 - see procedure note - f/u in 6 weeks, DFE, OCT, possible injection(s)  2. Retinal macroaneurysm OS  - focal lesion along IT arcades   - s/p IVA and IVE OS as above  - OCT shows stable improvement  - recommend IVE OS #3 as above  3,4. Hypertensive retinopathy OU - discussed importance of tight BP control - monitor  5.  Mixed Cataract OU  - being monitored by Dr. Delman Cheadle - The symptoms of cataract, surgical options, and treatments and risks were discussed with patient.  - discussed diagnosis and progression - approaching visual significance   Ophthalmic Meds Ordered this visit:  Meds ordered this encounter  Medications   aflibercept (EYLEA) SOLN 2 mg   aflibercept (EYLEA) SOLN 2 mg     Return in about 6 weeks (around 05/29/2022) for f/u NPDR OU, DFE, OCT.  There are no Patient Instructions on file for this visit.   Explained the diagnoses, plan, and follow up with the patient and they expressed understanding.  Patient expressed understanding of the importance of proper follow up care.   This document serves as a record of services personally performed by Gardiner Sleeper, MD, PhD. It was created on their behalf by San Jetty. Owens Shark, OA an ophthalmic technician. The  creation of this record is the provider's dictation and/or activities during the visit.    Electronically signed by: San Jetty. Marguerita Merles 09.15.2023 3:34 PM  Gardiner Sleeper, M.D., Ph.D. Diseases & Surgery of the Retina and Vitreous [facility]  I have reviewed the above documentation for accuracy and completeness, and I agree with the above. Gardiner Sleeper, M.D., Ph.D. 04/17/22 3:36 PM  Abbreviations: M myopia (nearsighted); A astigmatism; H hyperopia (farsighted); P presbyopia; Mrx spectacle prescription;  CTL contact lenses; OD right eye; OS left eye; OU both eyes  XT exotropia; ET esotropia; PEK punctate epithelial keratitis; PEE punctate epithelial erosions; DES dry eye syndrome; MGD meibomian gland dysfunction; ATs artificial tears; PFAT's preservative free artificial tears; [place] nuclear sclerotic cataract; PSC posterior subcapsular cataract; ERM epi-retinal membrane; PVD posterior vitreous detachment; RD retinal detachment; DM diabetes mellitus; DR diabetic retinopathy; NPDR non-proliferative diabetic retinopathy; PDR proliferative diabetic retinopathy; CSME clinically significant macular edema; DME diabetic macular edema; dbh dot blot hemorrhages; CWS cotton wool spot; POAG primary open angle glaucoma; C/D cup-to-disc ratio; HVF humphrey visual field; GVF goldmann visual field; OCT optical coherence tomography; IOP intraocular pressure; BRVO Branch retinal vein occlusion; CRVO central retinal vein occlusion; CRAO central retinal artery occlusion; BRAO branch retinal artery occlusion; RT retinal tear; SB scleral buckle; PPV pars plana vitrectomy; VH Vitreous hemorrhage; PRP panretinal laser photocoagulation; IVK intravitreal kenalog; VMT vitreomacular traction; MH Macular hole;  NVD neovascularization of the disc; NVE neovascularization  elsewhere; AREDS age related eye disease study; ARMD age related macular degeneration; POAG primary open angle glaucoma; EBMD  epithelial/anterior basement membrane dystrophy; ACIOL anterior chamber intraocular lens; IOL intraocular lens; PCIOL posterior chamber intraocular lens; Phaco/IOL phacoemulsification with intraocular lens placement; [place] photorefractive keratectomy; LASIK laser assisted in situ keratomileusis; HTN hypertension; DM diabetes mellitus; COPD chronic obstructive pulmonary disease

## 2022-04-15 DIAGNOSIS — L578 Other skin changes due to chronic exposure to nonionizing radiation: Secondary | ICD-10-CM | POA: Diagnosis not present

## 2022-04-15 DIAGNOSIS — L820 Inflamed seborrheic keratosis: Secondary | ICD-10-CM | POA: Diagnosis not present

## 2022-04-17 ENCOUNTER — Encounter (INDEPENDENT_AMBULATORY_CARE_PROVIDER_SITE_OTHER): Payer: Self-pay | Admitting: Ophthalmology

## 2022-04-17 ENCOUNTER — Ambulatory Visit (INDEPENDENT_AMBULATORY_CARE_PROVIDER_SITE_OTHER): Payer: PPO | Admitting: Ophthalmology

## 2022-04-17 DIAGNOSIS — E113313 Type 2 diabetes mellitus with moderate nonproliferative diabetic retinopathy with macular edema, bilateral: Secondary | ICD-10-CM

## 2022-04-17 DIAGNOSIS — H35033 Hypertensive retinopathy, bilateral: Secondary | ICD-10-CM | POA: Diagnosis not present

## 2022-04-17 DIAGNOSIS — H35012 Changes in retinal vascular appearance, left eye: Secondary | ICD-10-CM | POA: Diagnosis not present

## 2022-04-17 DIAGNOSIS — H25813 Combined forms of age-related cataract, bilateral: Secondary | ICD-10-CM

## 2022-04-17 DIAGNOSIS — I10 Essential (primary) hypertension: Secondary | ICD-10-CM | POA: Diagnosis not present

## 2022-04-17 MED ORDER — AFLIBERCEPT 2MG/0.05ML IZ SOLN FOR KALEIDOSCOPE
2.0000 mg | INTRAVITREAL | Status: AC | PRN
  Administered 2022-04-17: 2 mg via INTRAVITREAL

## 2022-04-18 ENCOUNTER — Other Ambulatory Visit: Payer: Self-pay | Admitting: Neurology

## 2022-05-01 ENCOUNTER — Ambulatory Visit: Payer: PPO | Admitting: Neurology

## 2022-06-02 NOTE — Progress Notes (Signed)
[facility] Note  06/03/2022    CHIEF COMPLAINT Patient presents for Retina Follow Up  HISTORY OF PRESENT ILLNESS: Chad [name] is a 76 y.o. male who presents to the clinic today for:   HPI     Retina Follow Up   Patient presents with  Diabetic Retinopathy.  In both eyes.  This started 7 weeks ago.  Severity is moderate.  Duration of 7 weeks.  Since onset it is stable.  I, the attending physician,  performed the HPI with the patient and updated documentation appropriately.        Comments   7 week Retina follow up pt states no vision changes noticed he denies flashes or floaters      Last edited by Bernarda Caffey, MD on 06/03/2022 12:58 PM.    Pt states vision is stable  Referring physician: Seward Carol, MD [address],  [place] [address]  HISTORICAL INFORMATION:   Selected notes from the MEDICAL RECORD NUMBER Referred by Dr. Delman Cheadle for diabetic retinopathy with DME OU [name]:  Ocular Hx- PMH-    CURRENT MEDICATIONS: No current outpatient medications on file. (Ophthalmic Drugs)   No current facility-administered medications for this visit. (Ophthalmic Drugs)   Current Outpatient Medications (Other)  Medication Sig   B-D ULTRAFINE III SHORT PEN 31G X 8 MM MISC SMARTSIG:1 Each SUB-Q Daily   Calcium Carb-Cholecalciferol (CALCIUM 500 + D) 500-125 MG-UNIT TABS Take 1 tablet by mouth daily.   gabapentin (NEURONTIN) 300 MG capsule TAKE 1 CAPSULE EVERY MORNING, 1 CAPSULE EVERY AFTERNOON AND 2 CAPSULES AT BEDTIME   insulin glargine (LANTUS) 100 UNIT/ML injection Inject 10 Units into the skin daily.   JARDIANCE 10 MG TABS tablet Take 10 mg by mouth daily.   levothyroxine (SYNTHROID, LEVOTHROID) 75 MCG tablet Take 75 mcg by mouth daily before breakfast.    losartan (COZAAR) 25 MG tablet Take 25 mg by mouth daily. bid   losartan-hydrochlorothiazide (HYZAAR) 100-25 MG tablet Take 1 tablet by mouth daily.   metoprolol succinate  (TOPROL-XL) 25 MG 24 hr tablet TAKE ONE TABLET BY MOUTH DAILY   nortriptyline (PAMELOR) 10 MG capsule TAKE THREE CAPSULES BY MOUTH EVERY NIGHT AT BEDTIME   ONETOUCH VERIO test strip    rosuvastatin (CRESTOR) 40 MG tablet Take 1 tablet by mouth daily.   No current facility-administered medications for this visit. (Other)   REVIEW OF SYSTEMS: ROS   Positive for: Respiratory Last edited by Bernarda Caffey, MD on 06/03/2022 12:32 PM.       ALLERGIES No Known Allergies  PAST MEDICAL HISTORY Past Medical History:  Diagnosis Date   Cataract    CKD (chronic kidney disease)    Colon polyps    Diabetes ([place])    Diabetic polyneuropathy associated with type 2 diabetes mellitus ([place]) 01/15/2017   Diabetic retinopathy ([place])    GERD (gastroesophageal reflux disease)    Hypercholesterolemia    Hypertension    Hypertensive retinopathy    Hypothyroidism    Polyneuropathy    Proteinuria    History reviewed. No pertinent surgical history.  FAMILY HISTORY Family History  Problem Relation Age of Onset   Stroke Father    Glaucoma Father    SOCIAL HISTORY Social History   Tobacco Use   Smoking status: Former   Smokeless tobacco: Never  Scientific laboratory technician Use: Never used  Substance Use Topics   Alcohol use: Yes   Drug use: No  OPHTHALMIC EXAM: Base Eye Exam     Visual Acuity (Snellen - Linear)       Right Left   Dist [place] 20/40 -2 20/40 -2   Dist ph [place] 20/30 -2 20/30 -1         Tonometry (Tonopen, 10:23 AM)       Right Left   Pressure 12 12         Pupils       Pupils Dark Light Shape React APD   Right PERRL 3 2 Round Brisk None   Left PERRL 3 2 Round Brisk None         Visual Fields       Left Right    Full Full         Extraocular Movement       Right Left    Full, Ortho Full, Ortho         Neuro/Psych     Oriented x3: Yes   Mood/Affect: Normal         Dilation     Both eyes: 2.5% Phenylephrine @ 10:23 AM           Slit  Lamp and Fundus Exam     External Exam       Right Left   External Normal Normal         Slit Lamp Exam       Right Left   Lids/Lashes Dermatochalasis - upper lid, mild Telangiectasia Dermatochalasis - upper lid, Meibomian gland dysfunction   Conjunctiva/Sclera White and quiet White and quiet   Cornea 2+ fine, inferior Punctate epithelial erosions 1+ Punctate epithelial erosions, mild tear film debris   Anterior Chamber deep, clear, narrow angles deep, clear, narrow angles   Iris Round and dilated, mild anterior bowing, No NVI Round and dilated, mild anterior bowing, No NVI   Lens 2-3+ Nuclear sclerosis, 2+ Cortical cataract 2-3+ Nuclear sclerosis, 2+ Cortical cataract   Anterior Vitreous Vitreous syneresis Vitreous syneresis         Fundus Exam       Right Left   Disc Pink and Sharp, no NVD Pink and Sharp, no NVD   C/D Ratio 0.3 0.3   Macula good foveal reflex, mild ERM, minimal MA and exudates -- improved, persistent central cystic changes / edema -- slightly improved, light CWS IT mac Flat, good foveal reflex, minimal MA and focal exudates, scattered cystic changes -- improved   Vessels attenuated, Tortuous attenuated, Tortuous, Copper wiring, Macroaneurysm / CWS along IT arcades - improving   Periphery Attached, scattered MA and CWS greatest posteriorly Attached, scattered MA/DBH greatest posteriorly, focal CWS inferior to IT arcades           IMAGING AND PROCEDURES  Imaging and Procedures for 06/03/2022  OCT, Retina - OU - Both Eyes       Right Eye Quality was good. Central Foveal Thickness: 305. Progression has improved. Findings include no SRF, abnormal foveal contour, intraretinal hyper-reflective material, epiretinal membrane, intraretinal fluid, macular pucker (Mild interval improvement in IRF / IRHM temporal macula and fovea).   Left Eye Quality was good. Central Foveal Thickness: 275. Progression has improved. Findings include no SRF, abnormal foveal  contour, intraretinal hyper-reflective material, intraretinal fluid (Interval improvement IRF/IRHM temporal fovea and macula, focal IRHM and retinal thickening along IT arcades -- macroaneurysm -- stably improved).   Notes *Images captured and stored on drive  Diagnosis / Impression:  +DME OU OD: mild interval improvement in IRF / edema  temporal macula and fovea OS: Interval improvement IRF/IRHM temporal fovea and macula, focal IRHM and retinal thickening along IT arcades -- macroaneurysm -- stably improved  Clinical management:  See below  Abbreviations: NFP - Normal foveal profile. CME - cystoid macular edema. PED - pigment epithelial detachment. IRF - intraretinal fluid. SRF - subretinal fluid. EZ - ellipsoid zone. ERM - epiretinal membrane. ORA - outer retinal atrophy. ORT - outer retinal tubulation. SRHM - subretinal hyper-reflective material. IRHM - intraretinal hyper-reflective material      Intravitreal Injection, Pharmacologic Agent - OD - Right Eye       Time Out 06/03/2022. 11:19 AM. Confirmed correct patient, procedure, site, and patient consented.   Anesthesia Topical anesthesia was used. Anesthetic medications included Lidocaine 2%, Proparacaine 0.5%.   Procedure Preparation included 5% betadine to ocular surface, eyelid speculum. A (32g) needle was used.   Injection: 2 mg aflibercept 2 MG/0.05ML   Route: Intravitreal, Site: Right Eye   NDC: A3590391, Lot: [id number], Expiration date: 08/27/2023, Waste: 0 mL   Post-op Post injection exam found visual acuity of at least counting fingers. The patient tolerated the procedure well. There were no complications. The patient received written and verbal post procedure care education. Post injection medications were not given.      Intravitreal Injection, Pharmacologic Agent - OS - Left Eye       Time Out 06/03/2022. 11:20 AM. Confirmed correct patient, procedure, site, and patient consented.   Anesthesia Topical  anesthesia was used. Anesthetic medications included Lidocaine 2%, Proparacaine 0.5%.   Procedure Preparation included 5% betadine to ocular surface, eyelid speculum. A (32g) needle was used.   Injection: 2 mg aflibercept 2 MG/0.05ML   Route: Intravitreal, Site: Left Eye   NDC: A3590391, Lot: [id number], Expiration date: 02/24/2024, Waste: 0 mL   Post-op Post injection exam found visual acuity of at least counting fingers. The patient tolerated the procedure well. There were no complications. The patient received written and verbal post procedure care education. Post injection medications were not given.            ASSESSMENT/PLAN:    ICD-10-CM   1. 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 Essential hypertension  I10     4. Hypertensive retinopathy of both eyes  H35.033     5. Combined forms of age-related cataract of both eyes  H25.813      1. Moderate Non-proliferative diabetic retinopathy w/ DME OU  - slightly delayed f/u to 6+ wks instead of 6  - s/p IVA OD #1 (11.17.22) - s/p IVA OS #1 (11.14.22), #7 (05.12.23), #8 (06.09.23)  - s/p IVA OU #2 (12.15.22), #3 (01.12.23), #4 (02.13.23), #5 (03.16.23), #6 (04.12.23)  **IVA resistance OU**  - s/p IVE OD #1 (05.12.23) -- sample, #2 (06.09.23), #3 (07.07.23), #4 (08.04.23), #5 (09.22.23)  - s/p IVE OS #1 (07.07.23), #2 (08.04.23), #3 (09.22.23) - exam shows scattered MA/DBH OU, +edema and exudates - FA (11.14.22) shows vascular perfusion defects OU, no NV OU, OS with focal macroaneurysm along IT arcades - OCT shows OD: mild interval  improvement in IRF /  edema temporal macula and fovea; OS: Interval improvement IRF/IRHM temporal fovea and macula, focal IRHM and retinal thickening along IT arcades -- macroaneurysm -- slightly improved at 6+ weeks  - BCVA 20/30 OU - recommend IVE OU today (OD #6, OS #4) 11.07.23 with f/u at 6 weeks - pt wishes to proceed with injections - RBA of procedure discussed, questions answered - IVA informed consent obtained and signed OU, 11.14.22 - IVE informed consent obtained and signed OU, 05.13.23 - see procedure note - f/u in 6 weeks, DFE, OCT, possible injection(s)  2. Retinal macroaneurysm OS  - focal lesion along IT arcades   - s/p IVA and IVE OS as above  - OCT shows stable improvement  - recommend IVE OS #4 as above  3,4. Hypertensive retinopathy OU - discussed importance of tight BP control - monitor  5.  Mixed Cataract OU  - being monitored by Dr. Delman Cheadle - The symptoms of cataract, surgical options, and treatments and risks were discussed with patient.  - discussed diagnosis and progression - approaching visual significance   Ophthalmic Meds Ordered this visit:  Meds ordered this encounter  Medications   aflibercept (EYLEA) SOLN 2 mg   aflibercept (EYLEA) SOLN 2 mg     Return in about 6 weeks (around 07/15/2022) for f/u NPDR OU, DFE, OCT.  There are no Patient Instructions on file for this visit.   Explained the diagnoses, plan, and follow up with the patient and they expressed understanding.  Patient expressed understanding of the importance of proper follow up care.   This document serves as a record of services personally performed by Gardiner Sleeper, MD, PhD. It was created on their behalf by Orvan Falconer, an ophthalmic technician. The creation of this record is the provider's dictation and/or activities during the visit.    Electronically signed by: Orvan Falconer, OA, 06/03/22  12:59 PM  This document serves as a record of services personally performed by Gardiner Sleeper,  MD, PhD. It was created on their behalf by San Jetty. Owens Shark, OA an ophthalmic technician. The creation of this record is the provider's dictation and/or activities during the visit.    Electronically signed by: San Jetty. Marguerita Merles 11.08.2023 12:59 PM    Gardiner Sleeper, M.D., Ph.D. Diseases & Surgery of the Retina and Vitreous [facility]  I have reviewed the above documentation for accuracy and completeness, and I agree with the above. Gardiner Sleeper, M.D., Ph.D. 06/03/22 1:00 PM  Abbreviations: M myopia (nearsighted); A astigmatism; H hyperopia (farsighted); P presbyopia; Mrx spectacle prescription;  CTL contact lenses; OD right eye; OS left eye; OU both eyes  XT exotropia; ET esotropia; PEK punctate epithelial keratitis; PEE punctate epithelial erosions; DES dry eye syndrome; MGD meibomian gland dysfunction; ATs artificial tears; PFAT's preservative free artificial tears; [place] nuclear sclerotic cataract; PSC posterior subcapsular cataract; ERM epi-retinal membrane; PVD posterior vitreous detachment; RD retinal detachment; DM diabetes mellitus; DR diabetic retinopathy; NPDR non-proliferative diabetic retinopathy; PDR proliferative diabetic retinopathy; CSME clinically significant macular edema; DME diabetic macular edema; dbh dot blot hemorrhages; CWS cotton wool spot; POAG primary open angle glaucoma; C/D cup-to-disc ratio; HVF humphrey visual field; GVF goldmann visual field; OCT optical coherence tomography; IOP intraocular pressure; BRVO Branch retinal vein occlusion; CRVO central retinal vein occlusion; CRAO central retinal artery occlusion; BRAO branch retinal artery occlusion; RT retinal tear; SB scleral buckle; PPV pars plana vitrectomy; VH Vitreous hemorrhage; PRP panretinal laser photocoagulation;  IVK intravitreal kenalog; VMT vitreomacular traction; MH Macular hole;  NVD neovascularization of the disc; NVE neovascularization elsewhere; AREDS age related eye disease  study; ARMD age related macular degeneration; POAG primary open angle glaucoma; EBMD epithelial/anterior basement membrane dystrophy; ACIOL anterior chamber intraocular lens; IOL intraocular lens; PCIOL posterior chamber intraocular lens; Phaco/IOL phacoemulsification with intraocular lens placement; [place] photorefractive keratectomy; LASIK laser assisted in situ keratomileusis; HTN hypertension; DM diabetes mellitus; COPD chronic obstructive pulmonary disease

## 2022-06-03 ENCOUNTER — Ambulatory Visit (INDEPENDENT_AMBULATORY_CARE_PROVIDER_SITE_OTHER): Payer: PPO | Admitting: Ophthalmology

## 2022-06-03 ENCOUNTER — Encounter (INDEPENDENT_AMBULATORY_CARE_PROVIDER_SITE_OTHER): Payer: Self-pay | Admitting: Ophthalmology

## 2022-06-03 DIAGNOSIS — H35012 Changes in retinal vascular appearance, left eye: Secondary | ICD-10-CM

## 2022-06-03 DIAGNOSIS — I10 Essential (primary) hypertension: Secondary | ICD-10-CM | POA: Diagnosis not present

## 2022-06-03 DIAGNOSIS — H25813 Combined forms of age-related cataract, bilateral: Secondary | ICD-10-CM | POA: Diagnosis not present

## 2022-06-03 DIAGNOSIS — E113313 Type 2 diabetes mellitus with moderate nonproliferative diabetic retinopathy with macular edema, bilateral: Secondary | ICD-10-CM | POA: Diagnosis not present

## 2022-06-03 DIAGNOSIS — H35033 Hypertensive retinopathy, bilateral: Secondary | ICD-10-CM

## 2022-06-03 MED ORDER — AFLIBERCEPT 2MG/0.05ML IZ SOLN FOR KALEIDOSCOPE
2.0000 mg | INTRAVITREAL | Status: AC | PRN
  Administered 2022-06-03: 2 mg via INTRAVITREAL

## 2022-06-30 DIAGNOSIS — L570 Actinic keratosis: Secondary | ICD-10-CM | POA: Diagnosis not present

## 2022-06-30 DIAGNOSIS — L814 Other melanin hyperpigmentation: Secondary | ICD-10-CM | POA: Diagnosis not present

## 2022-06-30 DIAGNOSIS — D225 Melanocytic nevi of trunk: Secondary | ICD-10-CM | POA: Diagnosis not present

## 2022-06-30 DIAGNOSIS — L821 Other seborrheic keratosis: Secondary | ICD-10-CM | POA: Diagnosis not present

## 2022-07-07 DIAGNOSIS — H903 Sensorineural hearing loss, bilateral: Secondary | ICD-10-CM | POA: Diagnosis not present

## 2022-07-09 ENCOUNTER — Other Ambulatory Visit (HOSPITAL_COMMUNITY): Payer: PPO

## 2022-07-09 ENCOUNTER — Ambulatory Visit: Payer: PPO | Admitting: Cardiovascular Disease

## 2022-07-09 NOTE — Progress Notes (Signed)
[facility] Note  07/15/2022    CHIEF COMPLAINT Patient presents for Retina Follow Up  HISTORY OF PRESENT ILLNESS: Chad [name] is a 76 y.o. male who presents to the clinic today for:   HPI     Retina Follow Up   Patient presents with  Diabetic Retinopathy.  In both eyes.  This started months ago.  Severity is moderate.  Duration of 6 weeks.  Since onset it is stable.  I, the attending physician,  performed the HPI with the patient and updated documentation appropriately.        Comments   Patient feels that the vision is slightly better. He is not using any eye drops at this time. His blood sugar was 156 and his A1C is 5.7.      Last edited by Bernarda Caffey, MD on 07/15/2022  1:02 PM.     Referring physician: Seward Carol, MD [address],  [place] [address]  HISTORICAL INFORMATION:   Selected notes from the MEDICAL RECORD NUMBER Referred by Dr. Delman Cheadle for diabetic retinopathy with DME OU [name]:  Ocular Hx- PMH-    CURRENT MEDICATIONS: No current outpatient medications on file. (Ophthalmic Drugs)   No current facility-administered medications for this visit. (Ophthalmic Drugs)   Current Outpatient Medications (Other)  Medication Sig   B-D ULTRAFINE III SHORT PEN 31G X 8 MM MISC SMARTSIG:1 Each SUB-Q Daily   Calcium Carb-Cholecalciferol (CALCIUM 500 + D) 500-125 MG-UNIT TABS Take 1 tablet by mouth daily.   gabapentin (NEURONTIN) 300 MG capsule TAKE 1 CAPSULE EVERY MORNING, 1 CAPSULE EVERY AFTERNOON AND 2 CAPSULES AT BEDTIME   insulin glargine (LANTUS) 100 UNIT/ML injection Inject 10 Units into the skin daily.   JARDIANCE 10 MG TABS tablet Take 10 mg by mouth daily.   levothyroxine (SYNTHROID, LEVOTHROID) 75 MCG tablet Take 75 mcg by mouth daily before breakfast.    losartan (COZAAR) 25 MG tablet Take 25 mg by mouth daily. bid   losartan-hydrochlorothiazide (HYZAAR) 100-25 MG tablet Take 1 tablet by mouth daily.    metoprolol succinate (TOPROL-XL) 25 MG 24 hr tablet TAKE ONE TABLET BY MOUTH DAILY   nortriptyline (PAMELOR) 10 MG capsule TAKE THREE CAPSULES BY MOUTH EVERY NIGHT AT BEDTIME   ONETOUCH VERIO test strip    rosuvastatin (CRESTOR) 40 MG tablet Take 1 tablet by mouth daily.   No current facility-administered medications for this visit. (Other)   REVIEW OF SYSTEMS: ROS   Positive for: Eyes, Respiratory Last edited by Annie Paras, COT on 07/15/2022  9:52 AM.     ALLERGIES No Known Allergies  PAST MEDICAL HISTORY Past Medical History:  Diagnosis Date   Cataract    CKD (chronic kidney disease)    Colon polyps    Diabetes ([place])    Diabetic polyneuropathy associated with type 2 diabetes mellitus ([place]) 01/15/2017   Diabetic retinopathy ([place])    GERD (gastroesophageal reflux disease)    Hypercholesterolemia    Hypertension    Hypertensive retinopathy    Hypothyroidism    Polyneuropathy    Proteinuria    History reviewed. No pertinent surgical history.  FAMILY HISTORY Family History  Problem Relation Age of Onset   Stroke Father    Glaucoma Father    SOCIAL HISTORY Social History   Tobacco Use   Smoking status: Former   Smokeless tobacco: Never  Scientific laboratory technician Use: Never used  Substance Use Topics   Alcohol  use: Yes   Drug use: No       OPHTHALMIC EXAM: Base Eye Exam     Visual Acuity (Snellen - Linear)       Right Left   Dist [place] 20/40 20/40   Dist ph [place] 20/30 20/30         Tonometry (Tonopen, 9:56 AM)       Right Left   Pressure 14 12         Pupils       Dark Light Shape React APD   Right 3 2 Round Brisk None   Left 3 2 Round Brisk None         Visual Fields       Left Right    Full Full         Extraocular Movement       Right Left    Full, Ortho Full, Ortho         Neuro/Psych     Oriented x3: Yes   Mood/Affect: Normal         Dilation     Both eyes: 1.0% Mydriacyl, 2.5% Phenylephrine @ 9:54 AM            Slit Lamp and Fundus Exam     External Exam       Right Left   External Normal Normal         Slit Lamp Exam       Right Left   Lids/Lashes Dermatochalasis - upper lid, mild Telangiectasia Dermatochalasis - upper lid, Meibomian gland dysfunction   Conjunctiva/Sclera White and quiet White and quiet   Cornea 2+ fine, inferior Punctate epithelial erosions 1+ Punctate epithelial erosions, mild tear film debris   Anterior Chamber deep, clear, narrow angles deep, clear, narrow angles   Iris Round and dilated, mild anterior bowing, No NVI Round and dilated, mild anterior bowing, No NVI   Lens 2-3+ Nuclear sclerosis, 2+ Cortical cataract 2-3+ Nuclear sclerosis, 2+ Cortical cataract   Anterior Vitreous Vitreous syneresis Vitreous syneresis         Fundus Exam       Right Left   Disc Pink and Sharp, no NVD Pink and Sharp, no NVD   C/D Ratio 0.3 0.3   Macula good foveal reflex, mild ERM, minimal MA and exudates -- improved, persistent central cystic changes / edema -- slightly improved, light CWS IT mac Flat, good foveal reflex, minimal MA and focal exudates, trace scattered cystic changes -- improved   Vessels attenuated, Tortuous attenuated, Tortuous, Copper wiring, Macroaneurysm / CWS along IT arcades - improving   Periphery Attached, scattered MA and CWS greatest posteriorly Attached, scattered MA/DBH greatest posteriorly, focal CWS inferior to IT arcades--fading           IMAGING AND PROCEDURES  Imaging and Procedures for 07/15/2022  OCT, Retina - OU - Both Eyes       Right Eye Quality was good. Central Foveal Thickness: 300. Progression has improved. Findings include no SRF, abnormal foveal contour, intraretinal hyper-reflective material, epiretinal membrane, intraretinal fluid, macular pucker (Mild interval improvement in IRF / IRHM temporal macula and fovea).   Left Eye Quality was good. Central Foveal Thickness: 272. Progression has improved. Findings include  normal foveal contour, no SRF, intraretinal hyper-reflective material, intraretinal fluid (Interval improvement IRF/IRHM centrally, trace persistent cystic changes sup mac.; focal IRHM and retinal thickening along IT arcades -- macroaneurysm -- stably improved).   Notes *Images captured and stored on drive  Diagnosis /  Impression:  +DME OU OD: mild interval improvement in IRF / edema temporal macula and fovea OS: Interval improvement IRF/IRHM centrally, trace persistent cystic changes sup mac.; focal IRHM and retinal thickening along IT arcades -- macroaneurysm -- stably improved  Clinical management:  See below  Abbreviations: NFP - Normal foveal profile. CME - cystoid macular edema. PED - pigment epithelial detachment. IRF - intraretinal fluid. SRF - subretinal fluid. EZ - ellipsoid zone. ERM - epiretinal membrane. ORA - outer retinal atrophy. ORT - outer retinal tubulation. SRHM - subretinal hyper-reflective material. IRHM - intraretinal hyper-reflective material      Intravitreal Injection, Pharmacologic Agent - OD - Right Eye       Time Out 07/15/2022. 10:25 AM. Confirmed correct patient, procedure, site, and patient consented.   Anesthesia Topical anesthesia was used. Anesthetic medications included Lidocaine 2%, Proparacaine 0.5%.   Procedure Preparation included 5% betadine to ocular surface, eyelid speculum. A (32g) needle was used.   Injection: 2 mg aflibercept 2 MG/0.05ML   Route: Intravitreal, Site: Right Eye   NDC: A3590391, Lot: [id number], Expiration date: 09/24/2023, Waste: 0 mL   Post-op Post injection exam found visual acuity of at least counting fingers. The patient tolerated the procedure well. There were no complications. The patient received written and verbal post procedure care education. Post injection medications were not given.      Intravitreal Injection, Pharmacologic Agent - OS - Left Eye       Time Out 07/15/2022. 10:26 AM. Confirmed  correct patient, procedure, site, and patient consented.   Anesthesia Topical anesthesia was used. Anesthetic medications included Lidocaine 2%, Proparacaine 0.5%.   Procedure Preparation included 5% betadine to ocular surface, eyelid speculum. A (32g) needle was used.   Injection: 2 mg aflibercept 2 MG/0.05ML   Route: Intravitreal, Site: Left Eye   NDC: A3590391, Lot: [id number], Expiration date: 09/24/2023, Waste: 0 mL   Post-op Post injection exam found visual acuity of at least counting fingers. The patient tolerated the procedure well. There were no complications. The patient received written and verbal post procedure care education. Post injection medications were not given.            ASSESSMENT/PLAN:    ICD-10-CM   1. 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 Essential hypertension  I10     4. Hypertensive retinopathy of both eyes  H35.033     5. Combined forms of age-related cataract of both eyes  H25.813      1. Moderate Non-proliferative diabetic retinopathy w/ DME OU  - s/p IVA OD #1 (11.17.22) - s/p IVA OS #1 (11.14.22), #7 (05.12.23), #8 (06.09.23)  - s/p IVA OU #2 (12.15.22), #3 (01.12.23), #4 (02.13.23), #5 (03.16.23), #6 (04.12.23)  **IVA resistance OU**  - s/p IVE OD #1 (05.12.23) -- sample, #2 (06.09.23), #3 (07.07.23), #4 (08.04.23), #5 (09.22.23), #6 (11.07.23)  - s/p IVE OS #1 (07.07.23), #2 (08.04.23), #3 (09.22.23), #4 (11.07.23) - exam shows scattered MA/DBH OU, +edema and exudates - FA (11.14.22) shows vascular perfusion defects OU, no NV OU, OS with focal macroaneurysm along  IT arcades - OCT shows OD: mild interval improvement in IRF / edema temporal macula and fovea; OS: Interval improvement IRF/IRHM centrally, trace persistent cystic changes sup mac.; focal IRHM and retinal thickening along IT arcades -- macroaneurysm -- stably improved  - BCVA 20/30 OU - recommend IVE OU today (OD #7, OS #5) 11.07.23 with f/u at 6 weeks - pt wishes to proceed with injections - RBA of procedure discussed, questions answered - IVA informed consent obtained and signed OU, 11.14.22 - IVE informed consent obtained and signed OU, 05.13.23 - see procedure note - f/u in 6 weeks, DFE, OCT, possible injection(s)  2. Retinal macroaneurysm OS  - focal lesion along IT arcades   - s/p IVA and IVE OS as above  - OCT shows stable improvement  3,4. Hypertensive retinopathy OU - discussed importance of tight BP control - monitor  5.  Mixed Cataract OU  - being monitored by Dr. Delman Cheadle - The symptoms of cataract, surgical options, and treatments and risks were discussed with patient.  - discussed diagnosis and progression - approaching visual significance    Ophthalmic Meds Ordered this visit:  Meds ordered this encounter  Medications   aflibercept (EYLEA) SOLN 2 mg   aflibercept (EYLEA) SOLN 2 mg     Return in about 6 weeks (around 08/26/2022) for f/u NPDR OU, DFE, OCT.  There are no Patient Instructions on file for this visit.   Explained the diagnoses, plan, and follow up with the patient and they expressed understanding.  Patient expressed understanding of the importance of proper follow up care.   This document serves as a record of services personally performed by Gardiner Sleeper, MD, PhD. It was created on their behalf by Orvan Falconer, an ophthalmic technician. The creation of this record is the provider's dictation and/or activities during the visit.    Electronically signed by: Orvan Falconer, OA, 07/15/22  1:02 PM  This document serves as a record of services  personally performed by Gardiner Sleeper, MD, PhD. It was created on their behalf by San Jetty. Owens Shark, OA an ophthalmic technician. The creation of this record is the provider's dictation and/or activities during the visit.    Electronically signed by: San Jetty. Owens Shark, [place] 12.20.2023 1:02 PM  Gardiner Sleeper, M.D., Ph.D. Diseases & Surgery of the Retina and Vitreous [facility]  I have reviewed the above documentation for accuracy and completeness, and I agree with the above. Gardiner Sleeper, M.D., Ph.D. 07/15/22 1:03 PM   Abbreviations: M myopia (nearsighted); A astigmatism; H hyperopia (farsighted); P presbyopia; Mrx spectacle prescription;  CTL contact lenses; OD right eye; OS left eye; OU both eyes  XT exotropia; ET esotropia; PEK punctate epithelial keratitis; PEE punctate epithelial erosions; DES dry eye syndrome; MGD meibomian gland dysfunction; ATs artificial tears; PFAT's preservative free artificial tears; [place] nuclear sclerotic cataract; PSC posterior subcapsular cataract; ERM epi-retinal membrane; PVD posterior vitreous detachment; RD retinal detachment; DM diabetes mellitus; DR diabetic retinopathy; NPDR non-proliferative diabetic retinopathy; PDR proliferative diabetic retinopathy; CSME clinically significant macular edema; DME diabetic macular edema; dbh dot blot hemorrhages; CWS cotton wool spot; POAG primary open angle glaucoma; C/D cup-to-disc ratio; HVF humphrey visual field; GVF goldmann visual field; OCT optical coherence tomography; IOP intraocular pressure; BRVO Branch retinal vein occlusion; CRVO central retinal vein occlusion; CRAO central retinal artery occlusion; BRAO branch retinal artery occlusion; RT retinal tear; SB scleral buckle; PPV pars plana vitrectomy; VH Vitreous hemorrhage; PRP panretinal laser photocoagulation; IVK  intravitreal kenalog; VMT vitreomacular traction; MH Macular hole;  NVD neovascularization of the disc; NVE neovascularization  elsewhere; AREDS age related eye disease study; ARMD age related macular degeneration; POAG primary open angle glaucoma; EBMD epithelial/anterior basement membrane dystrophy; ACIOL anterior chamber intraocular lens; IOL intraocular lens; PCIOL posterior chamber intraocular lens; Phaco/IOL phacoemulsification with intraocular lens placement; [place] photorefractive keratectomy; LASIK laser assisted in situ keratomileusis; HTN hypertension; DM diabetes mellitus; COPD chronic obstructive pulmonary disease

## 2022-07-15 ENCOUNTER — Encounter (INDEPENDENT_AMBULATORY_CARE_PROVIDER_SITE_OTHER): Payer: Self-pay | Admitting: Ophthalmology

## 2022-07-15 ENCOUNTER — Ambulatory Visit (INDEPENDENT_AMBULATORY_CARE_PROVIDER_SITE_OTHER): Payer: PPO | Admitting: Ophthalmology

## 2022-07-15 DIAGNOSIS — H25813 Combined forms of age-related cataract, bilateral: Secondary | ICD-10-CM

## 2022-07-15 DIAGNOSIS — H35012 Changes in retinal vascular appearance, left eye: Secondary | ICD-10-CM | POA: Diagnosis not present

## 2022-07-15 DIAGNOSIS — I10 Essential (primary) hypertension: Secondary | ICD-10-CM | POA: Diagnosis not present

## 2022-07-15 DIAGNOSIS — E113313 Type 2 diabetes mellitus with moderate nonproliferative diabetic retinopathy with macular edema, bilateral: Secondary | ICD-10-CM | POA: Diagnosis not present

## 2022-07-15 DIAGNOSIS — H35033 Hypertensive retinopathy, bilateral: Secondary | ICD-10-CM

## 2022-07-15 MED ORDER — AFLIBERCEPT 2MG/0.05ML IZ SOLN FOR KALEIDOSCOPE
2.0000 mg | INTRAVITREAL | Status: AC | PRN
  Administered 2022-07-15: 2 mg via INTRAVITREAL

## 2022-07-29 DIAGNOSIS — N1832 Chronic kidney disease, stage 3b: Secondary | ICD-10-CM | POA: Diagnosis not present

## 2022-08-05 DIAGNOSIS — E785 Hyperlipidemia, unspecified: Secondary | ICD-10-CM | POA: Diagnosis not present

## 2022-08-05 DIAGNOSIS — E039 Hypothyroidism, unspecified: Secondary | ICD-10-CM | POA: Diagnosis not present

## 2022-08-05 DIAGNOSIS — N1832 Chronic kidney disease, stage 3b: Secondary | ICD-10-CM | POA: Diagnosis not present

## 2022-08-05 DIAGNOSIS — I10 Essential (primary) hypertension: Secondary | ICD-10-CM | POA: Diagnosis not present

## 2022-08-05 DIAGNOSIS — E1122 Type 2 diabetes mellitus with diabetic chronic kidney disease: Secondary | ICD-10-CM | POA: Diagnosis not present

## 2022-08-11 NOTE — Progress Notes (Signed)
[facility] Note  08/25/2022    CHIEF COMPLAINT Patient presents for Retina Follow Up  HISTORY OF PRESENT ILLNESS: Chad [name] is a 77 y.o. male who presents to the clinic today for:   HPI     Retina Follow Up   Patient presents with  Diabetic Retinopathy.  In both eyes.  This started 6 weeks ago.  I, the attending physician,  performed the HPI with the patient and updated documentation appropriately.        Comments   Patient here for 6 weeks retina follow up for NPDR OU. Patient states vision doing ok. No eye pain. Uses no drops.       Last edited by Bernarda Caffey, MD on 08/25/2022 12:21 PM.      Referring physician: Seward Carol, MD [address],  [place] [address]  HISTORICAL INFORMATION:   Selected notes from the MEDICAL RECORD NUMBER Referred by Dr. Delman Cheadle for diabetic retinopathy with DME OU [name]:  Ocular Hx- PMH-    CURRENT MEDICATIONS: No current outpatient medications on file. (Ophthalmic Drugs)   No current facility-administered medications for this visit. (Ophthalmic Drugs)   Current Outpatient Medications (Other)  Medication Sig   B-D ULTRAFINE III SHORT PEN 31G X 8 MM MISC SMARTSIG:1 Each SUB-Q Daily   Calcium Carb-Cholecalciferol (CALCIUM 500 + D) 500-125 MG-UNIT TABS Take 1 tablet by mouth daily.   gabapentin (NEURONTIN) 300 MG capsule Take 2 tablet twice daily   insulin glargine (LANTUS) 100 UNIT/ML injection Inject 10 Units into the skin daily.   JARDIANCE 10 MG TABS tablet Take 10 mg by mouth daily.   levothyroxine (SYNTHROID, LEVOTHROID) 75 MCG tablet Take 75 mcg by mouth daily before breakfast.    losartan-hydrochlorothiazide (HYZAAR) 100-25 MG tablet Take 1 tablet by mouth daily.   metoprolol succinate (TOPROL-XL) 25 MG 24 hr tablet TAKE ONE TABLET BY MOUTH DAILY   nortriptyline (PAMELOR) 10 MG capsule TAKE THREE CAPSULES BY MOUTH EVERY NIGHT AT BEDTIME   ONETOUCH VERIO test strip     rosuvastatin (CRESTOR) 40 MG tablet Take 1 tablet by mouth daily.   No current facility-administered medications for this visit. (Other)   REVIEW OF SYSTEMS: ROS   Positive for: Endocrine, Cardiovascular, Eyes Negative for: Constitutional, Gastrointestinal, Neurological, Skin, Genitourinary, Musculoskeletal, HENT, Respiratory, Psychiatric, Allergic/Imm, Heme/Lymph Last edited by Theodore Demark, COA on 08/25/2022 10:15 AM.      ALLERGIES No Known Allergies  PAST MEDICAL HISTORY Past Medical History:  Diagnosis Date   Cataract    CKD (chronic kidney disease)    Colon polyps    Diabetes ([place])    Diabetic polyneuropathy associated with type 2 diabetes mellitus ([place]) 01/15/2017   Diabetic retinopathy ([place])    GERD (gastroesophageal reflux disease)    Hypercholesterolemia    Hypertension    Hypertensive retinopathy    Hypothyroidism    Polyneuropathy    Proteinuria    History reviewed. No pertinent surgical history.  FAMILY HISTORY Family History  Problem Relation Age of Onset   Stroke Father    Glaucoma Father    SOCIAL HISTORY Social History   Tobacco Use   Smoking status: Former   Smokeless tobacco: Never  Scientific laboratory technician Use: Never used  Substance Use Topics   Alcohol use: Yes   Drug use: No       OPHTHALMIC EXAM: Base Eye Exam     Visual Acuity (Snellen - Linear)  Right Left   Dist [place] 20/30 -2 20/40 -2   Dist ph [place] 20/30 +2 20/30         Tonometry (Tonopen, 10:13 AM)       Right Left   Pressure 12 12         Pupils       Dark Light Shape React APD   Right 3 2 Round Brisk None   Left 3 2 Round Brisk None         Visual Fields (Counting fingers)       Left Right    Full Full         Extraocular Movement       Right Left    Full, Ortho Full, Ortho         Neuro/Psych     Oriented x3: Yes   Mood/Affect: Normal         Dilation     Both eyes: 1.0% Mydriacyl, 2.5% Phenylephrine @ 10:13 AM            Slit Lamp and Fundus Exam     External Exam       Right Left   External Normal Normal         Slit Lamp Exam       Right Left   Lids/Lashes Dermatochalasis - upper lid, mild Telangiectasia Dermatochalasis - upper lid, Meibomian gland dysfunction   Conjunctiva/Sclera White and quiet White and quiet   Cornea 2+ fine, inferior Punctate epithelial erosions 1+ Punctate epithelial erosions, mild tear film debris   Anterior Chamber deep, clear, narrow angles deep, clear, narrow angles   Iris Round and dilated, mild anterior bowing, No NVI Round and dilated, mild anterior bowing, No NVI   Lens 2-3+ Nuclear sclerosis, 2+ Cortical cataract 2-3+ Nuclear sclerosis, 2+ Cortical cataract   Anterior Vitreous Vitreous syneresis Vitreous syneresis         Fundus Exam       Right Left   Disc Pink and Sharp, no NVD Pink and Sharp, no NVD   C/D Ratio 0.3 0.3   Macula good foveal reflex, mild ERM, minimal MA and exudates, persistent central cystic changes / edema, light CWS IT mac Flat, good foveal reflex, minimal MA and focal exudates, trace residual cystic changes   Vessels attenuated, Tortuous attenuated, Tortuous, Copper wiring, Macroaneurysm / CWS along IT arcades - improving   Periphery Attached, scattered MA and CWS greatest posteriorly Attached, scattered MA/DBH greatest posteriorly, focal CWS inferior to IT arcades--fading           IMAGING AND PROCEDURES  Imaging and Procedures for 08/25/2022  OCT, Retina - OU - Both Eyes       Right Eye Quality was good. Central Foveal Thickness: 309. Progression has been stable. Findings include no SRF, abnormal foveal contour, intraretinal hyper-reflective material, epiretinal membrane, intraretinal fluid, macular pucker (Persistent IRF / IRHM temporal macula and fovea -- mixed improvement).   Left Eye Quality was good. Central Foveal Thickness: 272. Progression has improved. Findings include normal foveal contour, no SRF, intraretinal  hyper-reflective material, intraretinal fluid (Stable improvement IRF/IRHM centrally, trace persistent cystic changes sup mac.; focal IRHM and retinal thickening along IT arcades -- macroaneurysm -- slightly improved).   Notes *Images captured and stored on drive  Diagnosis / Impression:  +DME OU OD: Persistent IRF / IRHM temporal macula and fovea -- mixed improvement OS: stable improvement IRF/IRHM centrally, trace persistent cystic changes sup mac.; focal IRHM and retinal thickening along IT  arcades -- macroaneurysm -- slightly improved  Clinical management:  See below  Abbreviations: NFP - Normal foveal profile. CME - cystoid macular edema. PED - pigment epithelial detachment. IRF - intraretinal fluid. SRF - subretinal fluid. EZ - ellipsoid zone. ERM - epiretinal membrane. ORA - outer retinal atrophy. ORT - outer retinal tubulation. SRHM - subretinal hyper-reflective material. IRHM - intraretinal hyper-reflective material      Intravitreal Injection, Pharmacologic Agent - OD - Right Eye       Time Out 08/25/2022. 11:47 AM. Confirmed correct patient, procedure, site, and patient consented.   Anesthesia Topical anesthesia was used. Anesthetic medications included Lidocaine 2%, Proparacaine 0.5%.   Procedure Preparation included 5% betadine to ocular surface, eyelid speculum. A (32g) needle was used.   Injection: 2 mg aflibercept 2 MG/0.05ML   Route: Intravitreal, Site: Right Eye   NDC: A3590391, Lot: [id number], Expiration date: 10/24/2022, Waste: 0 mL   Post-op Post injection exam found visual acuity of at least counting fingers. The patient tolerated the procedure well. There were no complications. The patient received written and verbal post procedure care education. Post injection medications were not given.      Intravitreal Injection, Pharmacologic Agent - OS - Left Eye       Time Out 08/25/2022. 11:47 AM. Confirmed correct patient, procedure, site, and patient  consented.   Anesthesia Topical anesthesia was used. Anesthetic medications included Lidocaine 2%, Proparacaine 0.5%.   Procedure Preparation included 5% betadine to ocular surface, eyelid speculum. A (32g) needle was used.   Injection: 2 mg aflibercept 2 MG/0.05ML   Route: Intravitreal, Site: Left Eye   NDC: A3590391, Lot: [id number], Expiration date: 08/28/2023, Waste: 0 mL   Post-op Post injection exam found visual acuity of at least counting fingers. The patient tolerated the procedure well. There were no complications. The patient received written and verbal post procedure care education. Post injection medications were not given.            ASSESSMENT/PLAN:    ICD-10-CM   1. 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 Essential hypertension  I10     4. Hypertensive retinopathy of both eyes  H35.033     5. Combined forms of age-related cataract of both eyes  H25.813      1. Moderate Non-proliferative diabetic retinopathy w/ DME OU  - s/p IVA OD #1 (11.17.22) - s/p IVA OS #1 (11.14.22), #7 (05.12.23), #8 (06.09.23)  - s/p IVA OU #2 (12.15.22), #3 (01.12.23), #4 (02.13.23), #5 (03.16.23), #6 (04.12.23),   **IVA resistance OU**  - s/p IVE OD #1 (05.12.23) -- sample, #2 (06.09.23), #3 (07.07.23), #4 (08.04.23), #5 (09.22.23), #6 (11.07.23), #7 (11.07.23)  - s/p IVE OS #1 (07.07.23), #2 (08.04.23), #3 (09.22.23), #4 (11.07.23),#5 (11.07.23) - exam shows scattered MA/DBH OU, +edema and exudates - FA (11.14.22) shows vascular perfusion defects OU, no NV OU, OS with focal macroaneurysm along IT arcades -  OCT shows OD: Persistent IRF / IRHM temporal macula and fovea -- mixed improvement; OS: stable improvement IRF/IRHM centrally, trace persistent cystic changes sup mac.; focal  IRHM and retinal thickening along IT arcades -- macroaneurysm -- slightly improved  - BCVA 20/30 OU - recommend IVE OU today (OD #8, OS #6) 1.30.24 with f/u at 6 weeks - pt wishes to proceed with injections - RBA of procedure discussed, questions answered - IVA informed consent obtained and signed OU, 11.14.22 - IVE informed consent obtained and signed OU, 05.13.23 - see procedure note - f/u in 6 weeks, DFE, OCT, possible injection(s)  2. Retinal macroaneurysm OS  - focal lesion along IT arcades -- regressing  - s/p IVA and IVE OS as above  - OCT shows progressive improvement  3,4. Hypertensive retinopathy OU - discussed importance of tight BP control - monitor  5.  Mixed Cataract OU  - being monitored by Dr. Delman Cheadle - The symptoms of cataract, surgical options, and treatments and risks were discussed with patient.  - discussed diagnosis and progression - approaching visual significance    Ophthalmic Meds Ordered this visit:  Meds ordered this encounter  Medications   aflibercept (EYLEA) SOLN 2 mg   aflibercept (EYLEA) SOLN 2 mg     Return in about 6 weeks (around 10/06/2022) for f/u NPDR OU, DFE, OCT.  There are no Patient Instructions on file for this visit.   Explained the diagnoses, plan, and follow up with the patient and they expressed understanding.  Patient expressed understanding of the importance of proper follow up care.   This document serves as a record of services personally performed by Gardiner Sleeper, MD, PhD. It was created on their behalf by Renaldo Reel, Jay an ophthalmic technician. The creation of this record is the provider's dictation and/or activities during the visit.    Electronically signed by:  Renaldo Reel, COT  01.16.24 12:22 PM  This document serves as a record  of services personally performed by Gardiner Sleeper, MD, PhD. It was created on their behalf by San Jetty. Owens Shark, OA an ophthalmic technician. The creation of this record is the provider's dictation and/or activities during the visit.    Electronically signed by: San Jetty. [place], [place] 01.30.2024 12:22 PM  Gardiner Sleeper, M.D., Ph.D. Diseases & Surgery of the Retina and Vitreous [facility]  I have reviewed the above documentation for accuracy and completeness, and I agree with the above. Gardiner Sleeper, M.D., Ph.D. 08/25/22 12:24 PM  Abbreviations: M myopia (nearsighted); A astigmatism; H hyperopia (farsighted); P presbyopia; Mrx spectacle prescription;  CTL contact lenses; OD right eye; OS left eye; OU both eyes  XT exotropia; ET esotropia; PEK punctate epithelial keratitis; PEE punctate epithelial erosions; DES dry eye syndrome; MGD meibomian gland dysfunction; ATs artificial tears; PFAT's preservative free artificial tears; Jack nuclear sclerotic cataract; PSC posterior subcapsular cataract; ERM epi-retinal membrane; PVD posterior vitreous detachment; RD retinal detachment; DM diabetes mellitus; DR diabetic retinopathy; NPDR non-proliferative diabetic retinopathy; PDR proliferative diabetic retinopathy; CSME clinically significant macular edema; DME diabetic macular edema; dbh dot blot hemorrhages; CWS cotton wool spot; POAG primary open angle glaucoma; C/D cup-to-disc ratio; HVF humphrey visual field; GVF goldmann visual field; OCT optical coherence tomography; IOP intraocular pressure; BRVO Branch retinal vein occlusion; CRVO central retinal vein occlusion; CRAO central retinal artery occlusion; BRAO branch retinal artery occlusion; RT retinal tear; SB scleral buckle; PPV pars plana vitrectomy; VH Vitreous hemorrhage; PRP panretinal laser photocoagulation; IVK intravitreal kenalog; VMT vitreomacular traction; MH Macular hole;  NVD neovascularization of the disc; NVE  neovascularization elsewhere; AREDS age related eye disease study; ARMD age related  macular degeneration; POAG primary open angle glaucoma; EBMD epithelial/anterior basement membrane dystrophy; ACIOL anterior chamber intraocular lens; IOL intraocular lens; PCIOL posterior chamber intraocular lens; Phaco/IOL phacoemulsification with intraocular lens placement; [place] photorefractive keratectomy; LASIK laser assisted in situ keratomileusis; HTN hypertension; DM diabetes mellitus; COPD chronic obstructive pulmonary disease

## 2022-08-13 NOTE — Progress Notes (Signed)
Cardiology Office Note   Date:  08/26/2022   ID:  Chad [name], DOB 24-Jun-1946, MRN [id number]  PCP:  Seward Carol, MD  Cardiologist:   Jenkins Rouge, MD   No chief complaint on file.      History of Present Illness:  77 y.o. with DM, HTN, HLD, hypothyroidism and polyneuropathy Seen in f/u for AS  Echo 06/30/19  EF 60-65% mild AS mean gradient 19 mmHg peak 35 mmHg Echo 12/7/21reviewed progression of AS to moderate mean gradient 29 mmHg peak 50 mmHg  And DVI 0.28  Echo 06/28/21 mean gradient 39.5 peak 69.9 AVA 0.8 cm2 DVI 0.25  Echo: 12/26/21 mean gradient 40.7 peak 68.4 mmHg AVA 0.64 cm2 DVI 0.20 Echo 08/26/22 mean gradient 44 peak 77 mmHg AVA 0.74 acm2 DVI .27   Normal ETT 09/03/17 maximum HR was only 110 73% PMHR Calcium score 08/27/17  795 81 st percentile dense in LAD he is on statin  Myovue Lexiscan normal 01/31/20 no ischemia EF 60%   Started on Pamelor for depression   Seeing neurology for peripheral neuropathy in feet On gabapentin and nortriptyline   [name] one of my patients Met at [facility] had CRF with Cr as high as 1.8 on ARB per nephrology   He has a son who is a GI doctor in [facility].   He has severe asymptomatic AS. Moved to [place] home and exercises regularly with no issues In June 2023 Discussed fact that asymptomatic severe AS usually develop symptoms within 2 years Discussed cath/CTA and choice between TAVR/SAVR. Given even more elevation in gradients will move forward with TAVR CTA studies just to make sure he is a candidate for TAVR and see what size/type would be suitable. I also discussed how having uncorrected severe AS would impact care of any non cardiac problems like a fractured hip or hospitalization that might require surgery   He has a cruise on [employer] to [place] in July and I will f/u with him in August  He knows he will need to have a   Insists that he has no symptoms and active Maybe mild dyspnea if he walks at pace in cold  weather  Past Medical History:  Diagnosis Date   Cataract    CKD (chronic kidney disease)    Colon polyps    Diabetes ([place])    Diabetic polyneuropathy associated with type 2 diabetes mellitus ([place]) 01/15/2017   Diabetic retinopathy ([place])    GERD (gastroesophageal reflux disease)    Hypercholesterolemia    Hypertension    Hypertensive retinopathy    Hypothyroidism    Polyneuropathy    Proteinuria     No past surgical history on file.   Current Outpatient Medications  Medication Sig Dispense Refill   Calcium Carb-Cholecalciferol (CALCIUM 500 + D) 500-125 MG-UNIT TABS Take 1 tablet by mouth daily.     gabapentin (NEURONTIN) 300 MG capsule Take 2 tablet twice daily 360 capsule 3   insulin glargine (LANTUS) 100 UNIT/ML injection Inject 10 Units into the skin daily.     JARDIANCE 10 MG TABS tablet Take 10 mg by mouth daily.     levothyroxine (SYNTHROID, LEVOTHROID) 75 MCG tablet Take 75 mcg by mouth daily before breakfast.      losartan-hydrochlorothiazide (HYZAAR) 100-25 MG tablet Take 1 tablet by mouth daily.     metoprolol succinate (TOPROL-XL) 25 MG 24 hr tablet TAKE ONE TABLET BY MOUTH DAILY 90 tablet 3   nortriptyline (PAMELOR)  10 MG capsule TAKE THREE CAPSULES BY MOUTH EVERY NIGHT AT BEDTIME 270 capsule 3   rosuvastatin (CRESTOR) 40 MG tablet Take 1 tablet by mouth daily.     B-D ULTRAFINE III SHORT PEN 31G X 8 MM MISC SMARTSIG:1 Each SUB-Q Daily     ONETOUCH VERIO test strip      No current facility-administered medications for this visit.    Allergies:   Patient has no known allergies.    Social History:  The patient  reports that he has quit smoking. He has never used smokeless tobacco. He reports current alcohol use. He reports that he does not use drugs.   Family History:  The patient's family history includes Glaucoma in his father; Stroke in his father.    ROS:  Please see the history of present illness.   Otherwise, review of systems are positive for none.    All other systems are reviewed and negative.    PHYSICAL EXAM: VS:  BP 102/70   Pulse (!) 47   Ht '5\' 8"'$  (1.727 m)   Wt 186 lb (84.4 kg)   BMI 28.28 kg/m  , BMI Body mass index is 28.28 kg/m. Affect appropriate Healthy:  appears stated age 54: normal Neck supple with no adenopathy JVP normal no bruits no thyromegaly Lungs clear with no wheezing and good diaphragmatic motion Heart:  S1/S2 AS  murmur, no rub, gallop or click PMI normal Abdomen: benighn, BS positve, no tenderness, no AAA no bruit.  No HSM or HJR Distal pulses intact with no bruits No edema Neuro sensory neuropathy in arms feet  Skin warm and dry No muscular weakness   EKG:  03/28/19 SR rate 77 QT 378  08/26/2022 SB rate 47 otherwise normal    Recent Labs: No results found for requested labs within last 365 days.    Lipid Panel No results found for: "CHOL", "TRIG", "HDL", "CHOLHDL", "VLDL", "LDLCALC", "LDLDIRECT"    Wt Readings from Last 3 Encounters:  08/26/22 186 lb (84.4 kg)  08/24/22 186 lb (84.4 kg)  12/26/21 179 lb (81.2 kg)      Other studies Reviewed: Additional studies/ records that were reviewed today include: Office notes neurology and primary .    ASSESSMENT AND PLAN:  1.  HTN: nephrology changed norvasc to ARB stable labs with primary  2. DM Discussed low carb diet.  Target hemoglobin A1c is 6.5 or less.  Continue current medications. A1c 6.2 Seeing Ophthalmology for bilateral retinopathy  3. HLD on statin labs with primary  4. Neuropathy f/u neuro Dr Posey Pronto severe sensorimotor polyneuropathy Rx with gabapentin and nortriptyline  5. Thyroid on replacement TSH normal  6. CAD:  High calcium score 08/27/17 795 81 st percentile dense in LAD Normal lexiscan myovue 01/31/20 stable  7. AS: severe by TTE today with increasing gradients TAVR CT scans f/u 6 months and likely proceed with cath at that time He knows to call us with any new symptoms of pre syncope, dyspnea or chest pain      Current medicines are reviewed at length with the patient today.  The patient does not have concerns regarding medicines.  The following changes have been made:  no change  Labs/ tests ordered today include:   TAVR CTA/BMET   Disposition:   FU with 6 months      Signed, Jenkins Rouge, MD  08/26/2022 9:54 AM    [facility] [place], [place], [place]  [address] Phone: [phone]; Fax: [phone]

## 2022-08-14 ENCOUNTER — Telehealth: Payer: Self-pay

## 2022-08-14 NOTE — Telephone Encounter (Signed)
Patient has echo schedule same day as appointment.

## 2022-08-14 NOTE — Telephone Encounter (Signed)
-----  Message from Josue Hector, MD sent at 08/13/2022 11:25 AM EST ----- See if we can get TTE for severe AS prior to his office visit 08/26/22

## 2022-08-24 ENCOUNTER — Encounter: Payer: Self-pay | Admitting: Neurology

## 2022-08-24 ENCOUNTER — Ambulatory Visit: Payer: PPO | Admitting: Neurology

## 2022-08-24 VITALS — BP 104/65 | HR 57 | Ht 68.5 in | Wt 186.0 lb

## 2022-08-24 DIAGNOSIS — E1142 Type 2 diabetes mellitus with diabetic polyneuropathy: Secondary | ICD-10-CM | POA: Diagnosis not present

## 2022-08-24 MED ORDER — GABAPENTIN 300 MG PO CAPS: ORAL_CAPSULE | ORAL | 3 refills | Status: DC

## 2022-08-24 NOTE — Progress Notes (Signed)
Follow-up Visit   Date: 08/24/22    SAADIQ POCHE MRN: [id number] DOB: [birth year]   Interim History: Barton Dubois is a 77 y.o. right-handed Caucasian male with well-controlled diabetes mellitus, hypertension, hypothyroidism, and hyperlipidemia returning to the clinic for follow-up of neuropathy.    History of present illness: Initial visit 12/2016:  Starting around 2016, he starting having pins and needle sensation over the soles and toes.  Symptoms are constant and worse with walking. He walks unassisted and has not had any falls. He has NCS/EMG of the legs performed by Dr. Domingo Cocking in June 2017 which showed severe sensorimotor polyneuropathy.  He has previously tried gabapentin, but does not recall the dose.  He takes Lyrica '200mg'$  twice daily and denies any side effects. He was diagnosed with diabetes in 2008 and has been managed with oral medications.  His last HbA1c 6.2.  He has also tried accupuncture.     UPDATE 04/26/2020:    His neuropathy has largely been stable and well-controlled on gabapentin '300mg'$  TID and nortriptyline '30mg'$  at bedtime. At his last visit, I increased nortriptyline back to '30mg'$  due to worsening paresthesias, which has helped.  He did not experience constipation with medication change.  He continues to have constant low grade tingling, but it is manageable.    He is now on insulin and tolerating this well, last HbA1c ~ 7.  UPDATE 04/28/2021:  He is here for 1 year follow-up visit.  Neuropathy remains stable and restricted to the feet.  No weakness.   He takes gabapentin '300mg'$  TID and nortriptyline '30mg'$  at bedtime.  He no longer hand any pain, only numbness and tingling. He had one mechanical fall while walking hand-in-hand with his wife and she tripped, pulling him down.  Fortunately, they did not injure themselves.  Last HbA1c 5.9.  UPDATE 08/24/2022:  he is here for  year follow-up.  He self- adjusted gabapentin to '900mg'$  in the morning and '600mg'$  at bedtime, but did not  noticed a huge change.  He continues to have numbness/tingling in the feet and sensation that they are asleep.  Wife has noticed mild hand tremor, but patient has not been aware of it. No interval falls.  He walks unassisted.  Medications:  Current Outpatient Medications on File Prior to Visit  Medication Sig Dispense Refill   B-D ULTRAFINE III SHORT PEN 31G X 8 MM MISC SMARTSIG:1 Each SUB-Q Daily     Calcium Carb-Cholecalciferol (CALCIUM 500 + D) 500-125 MG-UNIT TABS Take 1 tablet by mouth daily.     gabapentin (NEURONTIN) 300 MG capsule TAKE 1 CAPSULE EVERY MORNING, 1 CAPSULE EVERY AFTERNOON AND 2 CAPSULES AT BEDTIME 120 capsule 4   insulin glargine (LANTUS) 100 UNIT/ML injection Inject 10 Units into the skin daily.     JARDIANCE 10 MG TABS tablet Take 10 mg by mouth daily.     levothyroxine (SYNTHROID, LEVOTHROID) 75 MCG tablet Take 75 mcg by mouth daily before breakfast.      losartan-hydrochlorothiazide (HYZAAR) 100-25 MG tablet Take 1 tablet by mouth daily.     metoprolol succinate (TOPROL-XL) 25 MG 24 hr tablet TAKE ONE TABLET BY MOUTH DAILY 90 tablet 3   nortriptyline (PAMELOR) 10 MG capsule TAKE THREE CAPSULES BY MOUTH EVERY NIGHT AT BEDTIME 270 capsule 3   ONETOUCH VERIO test strip      rosuvastatin (CRESTOR) 40 MG tablet Take 1 tablet by mouth daily.     No current facility-administered medications on file prior to visit.  Allergies: No Known Allergies   Vital Signs:  BP 104/65   Pulse (!) 57   Ht 5' 8.5" (1.74 m)   Wt 186 lb (84.4 kg)   SpO2 98%   BMI 27.87 kg/m    Neurological Exam: MENTAL STATUS including orientation to time, place, person, recent and remote memory, attention span and concentration, language, and fund of knowledge is normal.  Speech is not dysarthric.  CRANIAL NERVES:  Pupils equal round and reactive to light.  Normal conjugate, extra-ocular eye movements in all directions of gaze.      MOTOR:  Motor strength is 5/5 in all extremities.  No  pronator drift.  Tone is normal.    MSRs:  Reflexes are 2+/4 throughout, except absent Achilles.  SENSORY:  Reduced vibration distal to ankles bilaterally.  Mildly reduced pin prick and light touch in the feet.   COORDINATION/GAIT: Gait appears stable, stooped, unassisted.   DATA: NCS/EMG of the legs 01/09/2016 performed elsewhere:  Severe sensory and motor polyneuropathy with chronic denervation consistent with patient's known history of diabetes mellitus.   Labs 12/2016:  vitamin B12 298, copper 95, SPEP with IFE A poorly-defined area of restricted protein mobility is detected and is reactive with IgG and Kappa antisera, folate >24.0  No results found for: "HGBA1C"   IMPRESSION/PLAN: 1.  Peripheral neuropathy, contributed by diabetes (well-controlled), stable  - Previously tried:  Lyrica  - Adjust gabapentin to '600mg'$  twice daily - Continue nortriptyline at bedtime gabapentin '300mg'$  TID and nortriptyline '30mg'$  at bedtime  2. 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  If I can answer any additional questions, I would be pleased to do so.    Sincerely,    Nyrie Sigal K. Posey Pronto, DO

## 2022-08-24 NOTE — Patient Instructions (Signed)
Adjust gabapentin to '600mg'$  twice daily  Continue nortriptyline '30mg'$  at bedtime  Return to clinic in 1 year

## 2022-08-25 ENCOUNTER — Encounter (INDEPENDENT_AMBULATORY_CARE_PROVIDER_SITE_OTHER): Payer: Self-pay | Admitting: Ophthalmology

## 2022-08-25 ENCOUNTER — Ambulatory Visit (INDEPENDENT_AMBULATORY_CARE_PROVIDER_SITE_OTHER): Payer: PPO | Admitting: Ophthalmology

## 2022-08-25 DIAGNOSIS — I10 Essential (primary) hypertension: Secondary | ICD-10-CM | POA: Diagnosis not present

## 2022-08-25 DIAGNOSIS — E113313 Type 2 diabetes mellitus with moderate nonproliferative diabetic retinopathy with macular edema, bilateral: Secondary | ICD-10-CM

## 2022-08-25 DIAGNOSIS — H35033 Hypertensive retinopathy, bilateral: Secondary | ICD-10-CM

## 2022-08-25 DIAGNOSIS — H25813 Combined forms of age-related cataract, bilateral: Secondary | ICD-10-CM

## 2022-08-25 DIAGNOSIS — H35012 Changes in retinal vascular appearance, left eye: Secondary | ICD-10-CM | POA: Diagnosis not present

## 2022-08-25 MED ORDER — AFLIBERCEPT 2MG/0.05ML IZ SOLN FOR KALEIDOSCOPE
2.0000 mg | INTRAVITREAL | Status: AC | PRN
  Administered 2022-08-25: 2 mg via INTRAVITREAL

## 2022-08-26 ENCOUNTER — Ambulatory Visit (HOSPITAL_COMMUNITY): Payer: PPO | Attending: Cardiology

## 2022-08-26 ENCOUNTER — Encounter: Payer: Self-pay | Admitting: Cardiovascular Disease

## 2022-08-26 ENCOUNTER — Ambulatory Visit: Payer: PPO | Admitting: Cardiovascular Disease

## 2022-08-26 VITALS — BP 102/70 | HR 47 | Ht 68.0 in | Wt 186.0 lb

## 2022-08-26 DIAGNOSIS — I350 Nonrheumatic aortic (valve) stenosis: Secondary | ICD-10-CM

## 2022-08-26 DIAGNOSIS — E782 Mixed hyperlipidemia: Secondary | ICD-10-CM | POA: Diagnosis not present

## 2022-08-26 DIAGNOSIS — I10 Essential (primary) hypertension: Secondary | ICD-10-CM

## 2022-08-26 LAB — ECHOCARDIOGRAM COMPLETE
AR max vel: 0.74 cm2
AV Area VTI: 0.84 cm2
AV Area mean vel: 0.78 cm2
AV Mean grad: 44 mmHg
AV Peak grad: 77.1 mmHg
Ao pk vel: 4.39 m/s
Area-P 1/2: 1.85 cm2
S' Lateral: 2.3 cm

## 2022-08-26 NOTE — Patient Instructions (Addendum)
Medication Instructions:  Your physician recommends that you continue on your current medications as directed. Please refer to the Current Medication list given to you today.  *If you need a refill on your cardiac medications before your next appointment, please call your pharmacy*  Lab Work: If you have labs (blood work) drawn today and your tests are completely normal, you will receive your results only by: [place] (if you have MyChart) OR A paper copy in the mail If you have any lab test that is abnormal or we need to change your treatment, we will call you to review the results.  Testing/Procedures: Cardiac CT scanning for TAVR protocol (CAT scanning), is a noninvasive, special x-ray that produces cross-sectional images of the body using x-rays and a computer. CT scans help physicians diagnose and treat medical conditions. For some CT exams, a contrast material is used to enhance visibility in the area of the body being studied. CT scans provide greater clarity and reveal more details than regular x-ray exams.  Follow-Up: At [facility], you and your health needs are our priority.  As part of our continuing mission to provide you with exceptional heart care, we have created designated Provider Care Teams.  These Care Teams include your primary Cardiologist (physician) and Advanced Practice Providers (APPs -  Physician Assistants and Nurse Practitioners) who all work together to provide you with the care you need, when you need it.  We recommend signing up for the patient portal called "MyChart".  Sign up information is provided on this After Visit Summary.  MyChart is used to connect with patients for Virtual Visits (Telemedicine).  Patients are able to view lab/test results, encounter notes, upcoming appointments, etc.  Non-urgent messages can be sent to your provider as well.   To learn more about what you can do with MyChart, go to [email or web address].    Your next  appointment:   6 month(s)  Provider:   Jenkins Rouge, MD       Your cardiac CT will be scheduled at one of the below locations:   [facility] [address] [place], [place] [address] [phone]  If scheduled at [facility], please arrive at the [facility] and Children's Entrance (Entrance C2) of [facility] 30 minutes prior to test start time. You can use the FREE valet parking offered at entrance C (encouraged to control the heart rate for the test)  Proceed to the [facility] Radiology Department (first floor) to check-in and test prep.  All radiology patients and guests should use entrance C2 at [facility], accessed from [facility], even though the hospital's physical address listed is [address].     Please follow these instructions carefully (unless otherwise directed):  Hold all erectile dysfunction medications at least 3 days (72 hrs) prior to test. (Ie viagra, cialis, sildenafil, tadalafil, etc) We will administer nitroglycerin during this exam.   On the Night Before the Test: Be sure to Drink plenty of water. Do not consume any caffeinated/decaffeinated beverages or chocolate 12 hours prior to your test. Do not take any antihistamines 12 hours prior to your test.  On the Day of the Test: Drink plenty of water until 1 hour prior to the test. Do not eat any food 1 hour prior to test. You may take your regular medications prior to the test.  Take metoprolol (Lopressor) two hours prior to test. HOLD losartan-Hydrochlorothiazide morning of the test  After the Test: Drink plenty of water. After receiving IV contrast, you may experience a mild flushed feeling. This is normal. On occasion, you may experience a mild rash up to 24 hours after the test. This is not dangerous. If this occurs, you can take Benadryl 25 mg and increase your fluid intake. If you experience trouble breathing, this can be serious. If  it is severe call 911 IMMEDIATELY. If it is mild, please call our office.   We will call to schedule your test 2-4 weeks out understanding that some insurance companies will need an authorization prior to the service being performed.   For non-scheduling related questions, please contact the cardiac imaging nurse navigator should you have any questions/concerns: Marchia Bond, Cardiac Imaging Nurse Navigator Gordy Clement, Cardiac Imaging Nurse Navigator  Heart and Vascular Services Direct Office Dial: [phone]   For scheduling needs, including cancellations and rescheduling, please call [place], [phone].

## 2022-08-27 ENCOUNTER — Telehealth: Payer: Self-pay

## 2022-08-27 DIAGNOSIS — I10 Essential (primary) hypertension: Secondary | ICD-10-CM

## 2022-08-27 DIAGNOSIS — E782 Mixed hyperlipidemia: Secondary | ICD-10-CM

## 2022-08-27 LAB — BASIC METABOLIC PANEL
BUN/Creatinine Ratio: 27 — ABNORMAL HIGH (ref 10–24)
BUN: 61 mg/dL — ABNORMAL HIGH (ref 8–27)
CO2: 21 mmol/L (ref 20–29)
Calcium: 9.5 mg/dL (ref 8.6–10.2)
Chloride: 97 mmol/L (ref 96–106)
Creatinine, Ser: 2.23 mg/dL — ABNORMAL HIGH (ref 0.76–1.27)
Glucose: 209 mg/dL — ABNORMAL HIGH (ref 70–99)
Potassium: 4 mmol/L (ref 3.5–5.2)
Sodium: 135 mmol/L (ref 134–144)
eGFR: 30 mL/min/{1.73_m2} — ABNORMAL LOW (ref >59)

## 2022-08-27 NOTE — Telephone Encounter (Signed)
-----  Message from Josue Hector, MD sent at 08/27/2022  7:14 AM EST ----- Hold losartan/HCTZ BUN/Cr too high repeat BMET Monday and try to do TAVR CTA next week if Cr better

## 2022-08-27 NOTE — Telephone Encounter (Signed)
Patient coming into office on Monday for BMET.

## 2022-08-31 ENCOUNTER — Encounter: Payer: Self-pay | Admitting: Cardiovascular Disease

## 2022-08-31 ENCOUNTER — Ambulatory Visit: Payer: PPO | Attending: Cardiovascular Disease

## 2022-08-31 DIAGNOSIS — R7989 Other specified abnormal findings of blood chemistry: Secondary | ICD-10-CM

## 2022-08-31 DIAGNOSIS — I350 Nonrheumatic aortic (valve) stenosis: Secondary | ICD-10-CM

## 2022-08-31 DIAGNOSIS — E782 Mixed hyperlipidemia: Secondary | ICD-10-CM

## 2022-08-31 DIAGNOSIS — I10 Essential (primary) hypertension: Secondary | ICD-10-CM | POA: Diagnosis not present

## 2022-09-01 LAB — BASIC METABOLIC PANEL
BUN/Creatinine Ratio: 19 (ref 10–24)
BUN: 46 mg/dL — ABNORMAL HIGH (ref 8–27)
CO2: 18 mmol/L — ABNORMAL LOW (ref 20–29)
Calcium: 9.1 mg/dL (ref 8.6–10.2)
Chloride: 101 mmol/L (ref 96–106)
Creatinine, Ser: 2.37 mg/dL — ABNORMAL HIGH (ref 0.76–1.27)
Glucose: 139 mg/dL — ABNORMAL HIGH (ref 70–99)
Potassium: 4.5 mmol/L (ref 3.5–5.2)
Sodium: 138 mmol/L (ref 134–144)
eGFR: 28 mL/min/{1.73_m2} — ABNORMAL LOW (ref >59)

## 2022-09-01 NOTE — Telephone Encounter (Signed)
Sent message by fax to Dr. Joylene Grapes office for clearance.

## 2022-09-04 DIAGNOSIS — H9072 Mixed conductive and sensorineural hearing loss, unilateral, left ear, with unrestricted hearing on the contralateral side: Secondary | ICD-10-CM | POA: Diagnosis not present

## 2022-09-04 DIAGNOSIS — H90A21 Sensorineural hearing loss, unilateral, right ear, with restricted hearing on the contralateral side: Secondary | ICD-10-CM | POA: Diagnosis not present

## 2022-09-10 DIAGNOSIS — E1140 Type 2 diabetes mellitus with diabetic neuropathy, unspecified: Secondary | ICD-10-CM | POA: Diagnosis not present

## 2022-09-10 DIAGNOSIS — E113313 Type 2 diabetes mellitus with moderate nonproliferative diabetic retinopathy with macular edema, bilateral: Secondary | ICD-10-CM | POA: Diagnosis not present

## 2022-09-10 DIAGNOSIS — Q253 Supravalvular aortic stenosis: Secondary | ICD-10-CM | POA: Diagnosis not present

## 2022-09-10 DIAGNOSIS — E11319 Type 2 diabetes mellitus with unspecified diabetic retinopathy without macular edema: Secondary | ICD-10-CM | POA: Diagnosis not present

## 2022-09-10 DIAGNOSIS — N1832 Chronic kidney disease, stage 3b: Secondary | ICD-10-CM | POA: Diagnosis not present

## 2022-09-10 DIAGNOSIS — E7800 Pure hypercholesterolemia, unspecified: Secondary | ICD-10-CM | POA: Diagnosis not present

## 2022-09-10 DIAGNOSIS — I10 Essential (primary) hypertension: Secondary | ICD-10-CM | POA: Diagnosis not present

## 2022-09-10 DIAGNOSIS — E1122 Type 2 diabetes mellitus with diabetic chronic kidney disease: Secondary | ICD-10-CM | POA: Diagnosis not present

## 2022-09-10 DIAGNOSIS — E039 Hypothyroidism, unspecified: Secondary | ICD-10-CM | POA: Diagnosis not present

## 2022-09-10 DIAGNOSIS — H35033 Hypertensive retinopathy, bilateral: Secondary | ICD-10-CM | POA: Diagnosis not present

## 2022-09-14 ENCOUNTER — Encounter: Payer: Self-pay | Admitting: Cardiovascular Disease

## 2022-09-14 DIAGNOSIS — E785 Hyperlipidemia, unspecified: Secondary | ICD-10-CM | POA: Diagnosis not present

## 2022-09-14 DIAGNOSIS — E1122 Type 2 diabetes mellitus with diabetic chronic kidney disease: Secondary | ICD-10-CM | POA: Diagnosis not present

## 2022-09-14 DIAGNOSIS — I129 Hypertensive chronic kidney disease with stage 1 through stage 4 chronic kidney disease, or unspecified chronic kidney disease: Secondary | ICD-10-CM | POA: Diagnosis not present

## 2022-09-14 DIAGNOSIS — N1832 Chronic kidney disease, stage 3b: Secondary | ICD-10-CM | POA: Diagnosis not present

## 2022-09-14 DIAGNOSIS — E039 Hypothyroidism, unspecified: Secondary | ICD-10-CM | POA: Diagnosis not present

## 2022-09-15 NOTE — Telephone Encounter (Signed)
[facility] to ask for patient's office visit notes from yesterday. They will fax over to our office. Fax number provided.

## 2022-09-17 ENCOUNTER — Encounter: Payer: Self-pay | Admitting: Cardiovascular Disease

## 2022-09-18 ENCOUNTER — Other Ambulatory Visit: Payer: Self-pay

## 2022-09-18 DIAGNOSIS — I350 Nonrheumatic aortic (valve) stenosis: Secondary | ICD-10-CM

## 2022-09-18 DIAGNOSIS — R7989 Other specified abnormal findings of blood chemistry: Secondary | ICD-10-CM

## 2022-09-22 ENCOUNTER — Other Ambulatory Visit (HOSPITAL_COMMUNITY): Payer: Self-pay

## 2022-09-22 NOTE — Progress Notes (Signed)
[facility] Note  10/06/2022    CHIEF COMPLAINT Patient presents for Retina Follow Up  HISTORY OF PRESENT ILLNESS: Chad [name] is a 77 y.o. male who presents to the clinic today for:   HPI     Retina Follow Up   Patient presents with  Diabetic Retinopathy.  In both eyes.  This started 6 weeks ago.  I, the attending physician,  performed the HPI with the patient and updated documentation appropriately.        Comments   Patient here for 6 weeks retina follow up for NPDR OU. Patient states vision doing ok. No better, no worse. No eye pain. Not taking losartan/HCTZ  temporarily.       Last edited by Bernarda Caffey, MD on 10/06/2022 12:23 PM.     Referring physician: Seward Carol, MD [address],  [place] [address]  HISTORICAL INFORMATION:   Selected notes from the MEDICAL RECORD NUMBER Referred by Dr. Delman Cheadle for diabetic retinopathy with DME OU [name]:  Ocular Hx- PMH-    CURRENT MEDICATIONS: No current outpatient medications on file. (Ophthalmic Drugs)   No current facility-administered medications for this visit. (Ophthalmic Drugs)   Current Outpatient Medications (Other)  Medication Sig   amLODipine (NORVASC) 5 MG tablet Take 1 tablet (5 mg total) by mouth daily.   Ascorbic Acid (VITAMIN C) 500 MG CAPS 1 tablet Orally Once a day   B-D ULTRAFINE III SHORT PEN 31G X 8 MM MISC SMARTSIG:1 Each SUB-Q Daily   Calcium Carb-Cholecalciferol (CALCIUM 500 + D) 500-125 MG-UNIT TABS Take 1 tablet by mouth daily.   Cyanocobalamin 5000 MCG TBDP 1 tablet Sublingual Once a day   gabapentin (NEURONTIN) 300 MG capsule Take 2 tablet twice daily   insulin glargine (LANTUS) 100 UNIT/ML injection Inject 10 Units into the skin daily.   JARDIANCE 10 MG TABS tablet Take 10 mg by mouth daily.   levothyroxine (SYNTHROID, LEVOTHROID) 75 MCG tablet Take 75 mcg by mouth daily before breakfast.    metoprolol succinate (TOPROL-XL) 25 MG 24 hr  tablet TAKE ONE TABLET BY MOUTH DAILY   nortriptyline (PAMELOR) 10 MG capsule TAKE THREE CAPSULES BY MOUTH EVERY NIGHT AT BEDTIME   ONETOUCH VERIO test strip    rosuvastatin (CRESTOR) 40 MG tablet Take 1 tablet by mouth daily.   losartan-hydrochlorothiazide (HYZAAR) 100-25 MG tablet Take 1 tablet by mouth daily. (Patient not taking: Reported on 09/28/2022)   No current facility-administered medications for this visit. (Other)   REVIEW OF SYSTEMS: ROS   Positive for: Endocrine, Cardiovascular, Eyes Negative for: Constitutional, Gastrointestinal, Neurological, Skin, Genitourinary, Musculoskeletal, HENT, Respiratory, Psychiatric, Allergic/Imm, Heme/Lymph Last edited by Theodore Demark, COA on 10/06/2022  9:38 AM.       ALLERGIES No Known Allergies  PAST MEDICAL HISTORY Past Medical History:  Diagnosis Date   Cataract    CKD (chronic kidney disease)    Colon polyps    Diabetes ([place])    Diabetic polyneuropathy associated with type 2 diabetes mellitus ([place]) 01/15/2017   Diabetic retinopathy ([place])    GERD (gastroesophageal reflux disease)    Hypercholesterolemia    Hypertension    Hypertensive retinopathy    Hypothyroidism    Polyneuropathy    Proteinuria    History reviewed. No pertinent surgical history.  FAMILY HISTORY Family History  Problem Relation Age of Onset   Stroke Father    Glaucoma Father    SOCIAL HISTORY Social History  Tobacco Use   Smoking status: Former   Smokeless tobacco: Never  Scientific laboratory technician Use: Never used  Substance Use Topics   Alcohol use: Yes   Drug use: No       OPHTHALMIC EXAM: Base Eye Exam     Visual Acuity (Snellen - Linear)       Right Left   Dist [place] 20/30 -2 20/40 -2   Dist ph  NI 20/30 -1         Tonometry (Tonopen, 9:35 AM)       Right Left   Pressure 14 15         Pupils       Dark Light Shape React APD   Right 3 2 Round Brisk None   Left 3 2 Round Brisk None         Visual Fields (Counting  fingers)       Left Right    Full Full         Extraocular Movement       Right Left    Full, Ortho Full, Ortho         Neuro/Psych     Oriented x3: Yes   Mood/Affect: Normal         Dilation     Both eyes: 1.0% Mydriacyl, 2.5% Phenylephrine @ 9:35 AM           Slit Lamp and Fundus Exam     External Exam       Right Left   External Normal Normal         Slit Lamp Exam       Right Left   Lids/Lashes Dermatochalasis - upper lid, mild Telangiectasia Dermatochalasis - upper lid, Meibomian gland dysfunction   Conjunctiva/Sclera White and quiet White and quiet   Cornea 2+ fine, inferior Punctate epithelial erosions 1+ Punctate epithelial erosions, mild tear film debris   Anterior Chamber deep, clear, narrow angles deep, clear, narrow angles   Iris Round and dilated, mild anterior bowing, No NVI Round and dilated, mild anterior bowing, No NVI   Lens 2-3+ Nuclear sclerosis, 2+ Cortical cataract 2-3+ Nuclear sclerosis, 2+ Cortical cataract   Anterior Vitreous Vitreous syneresis Vitreous syneresis         Fundus Exam       Right Left   Disc Pink and Sharp, no NVD Pink and Sharp, no NVD   C/D Ratio 0.3 0.3   Macula good foveal reflex, mild ERM, minimal MA and exudates, persistent central cystic changes -- improved, light CWS IT mac Flat, good foveal reflex, minimal MA and focal exudates, trace residual cystic changes superior mac -- improved   Vessels attenuated, Tortuous attenuated, Tortuous, Copper wiring, Macroaneurysm / CWS along IT arcades - improving   Periphery Attached, scattered MA and CWS greatest posteriorly Attached, scattered MA/DBH greatest posteriorly, focal CWS inferior to IT arcades--fading           IMAGING AND PROCEDURES  Imaging and Procedures for 10/06/2022  OCT, Retina - OU - Both Eyes       Right Eye Quality was good. Central Foveal Thickness: 295. Progression has improved. Findings include no SRF, abnormal foveal contour,  intraretinal hyper-reflective material, epiretinal membrane, intraretinal fluid, macular pucker (Persistent IRF / IRHM temporal macula and fovea -- improved).   Left Eye Quality was good. Central Foveal Thickness: 267. Progression has improved. Findings include normal foveal contour, no SRF, intraretinal hyper-reflective material, intraretinal fluid (Stable improvement IRF/IRHM centrally, trace persistent  cystic changes sup mac -- slightly improved).   Notes *Images captured and stored on drive  Diagnosis / Impression:  +DME OU OD: Persistent IRF / IRHM temporal macula and fovea -- improved OS: Stable improvement IRF/IRHM centrally, trace persistent cystic changes sup mac -- slightly improved  Clinical management:  See below  Abbreviations: NFP - Normal foveal profile. CME - cystoid macular edema. PED - pigment epithelial detachment. IRF - intraretinal fluid. SRF - subretinal fluid. EZ - ellipsoid zone. ERM - epiretinal membrane. ORA - outer retinal atrophy. ORT - outer retinal tubulation. SRHM - subretinal hyper-reflective material. IRHM - intraretinal hyper-reflective material      Intravitreal Injection, Pharmacologic Agent - OD - Right Eye       Time Out 10/06/2022. 10:42 AM. Confirmed correct patient, procedure, site, and patient consented.   Anesthesia Topical anesthesia was used. Anesthetic medications included Lidocaine 2%, Proparacaine 0.5%.   Procedure Preparation included 5% betadine to ocular surface, eyelid speculum. A (32g) needle was used.   Injection: 2 mg aflibercept 2 MG/0.05ML   Route: Intravitreal, Site: Right Eye   NDC: O5083423, Lot: [id number], Expiration date: 09/24/2023, Waste: 0 mL   Post-op Post injection exam found visual acuity of at least counting fingers. The patient tolerated the procedure well. There were no complications. The patient received written and verbal post procedure care education. Post injection medications were not given.       Intravitreal Injection, Pharmacologic Agent - OS - Left Eye       Time Out 10/06/2022. 10:43 AM. Confirmed correct patient, procedure, site, and patient consented.   Anesthesia Topical anesthesia was used. Anesthetic medications included Lidocaine 2%, Proparacaine 0.5%.   Procedure Preparation included 5% betadine to ocular surface, eyelid speculum. A (32g) needle was used.   Injection: 2 mg aflibercept 2 MG/0.05ML   Route: Intravitreal, Site: Left Eye   NDC: O5083423, Lot: [id number], Expiration date: 05/27/2023, Waste: 0 mL   Post-op Post injection exam found visual acuity of at least counting fingers. The patient tolerated the procedure well. There were no complications. The patient received written and verbal post procedure care education. Post injection medications were not given.            ASSESSMENT/PLAN:    ICD-10-CM   1. 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 Essential hypertension  I10     4. Hypertensive retinopathy of both eyes  H35.033     5. Combined forms of age-related cataract of both eyes  H25.813      1. Moderate Non-proliferative diabetic retinopathy w/ DME OU  - s/p IVA OD #1 (11.17.22) - s/p IVA OS #1 (11.14.22), #7 (05.12.23), #8 (06.09.23)  - s/p IVA OU #2 (12.15.22), #3 (01.12.23), #4 (02.13.23), #5 (03.16.23), #6 (04.12.23),   **IVA resistance OU**  - s/p IVE OD #1 (05.12.23) -- sample, #2 (06.09.23), #3 (07.07.23), #4 (08.04.23), #5 (09.22.23), #6 (11.07.23), #7 (11.07.23), #8 (01.30.24)  - s/p IVE OS #1 (07.07.23), #2 (08.04.23), #3 (09.22.23), #4 (11.07.23),#5  (11.07.23), #6 (01.30.24) - exam shows scattered MA/DBH OU, +edema and  exudates - FA (11.14.22) shows vascular perfusion defects OU, no NV OU, OS with focal macroaneurysm along IT arcades - OCT shows OD: Persistent IRF / IRHM temporal macula and fovea -- improved; OS: Stable improvement IRF/IRHM centrally, trace persistent cystic changes sup mac -- slightly improved at 6 weeks  - BCVA 20/30 OU -- stable - recommend IVE OU today (OD #9, OS #7) 03.12.24 with f/u at 6 weeks - pt wishes to proceed with injections - RBA of procedure discussed, questions answered - IVA informed consent obtained and signed OU, 11.14.22 - IVE informed consent obtained and signed OU, 05.13.23 - see procedure note - f/u in 6 weeks, DFE, OCT, possible injection(s)  2. Retinal macroaneurysm OS  - focal lesion along IT arcades -- regressed  - s/p IVE OU as above  3,4. Hypertensive retinopathy OU - discussed importance of tight BP control - continue to monitor  5.  Mixed Cataract OU  - being monitored by Dr. Delman Cheadle - The symptoms of cataract, surgical options, and treatments and risks were discussed with patient.  - discussed diagnosis and progression - approaching visual significance   Ophthalmic Meds Ordered this visit:  Meds ordered this encounter  Medications   aflibercept (EYLEA) SOLN 2 mg   aflibercept (EYLEA) SOLN 2 mg     Return in about 6 weeks (around 11/17/2022) for f/u NPDR OU, DFE, OCT.  There are no Patient Instructions on file for this visit.   Explained the diagnoses, plan, and follow up with the patient and they expressed understanding.  Patient expressed understanding of the importance of proper follow up care.   This document serves as a record of services personally performed by Gardiner Sleeper, MD, PhD. It was created on their behalf by Renaldo Reel, [place] an ophthalmic technician. The creation of this record is the provider's dictation and/or activities during the visit.     Electronically signed by:  Renaldo Reel, COT  02.27.24 12:24 PM  This document serves as a record of services personally performed by Gardiner Sleeper, MD, PhD. It was created on their behalf by San Jetty. Owens Shark, OA an ophthalmic technician. The creation of this record is the provider's dictation and/or activities during the visit.    Electronically signed by: San Jetty. Marguerita Merles 03.12.2024 12:24 PM  Gardiner Sleeper, M.D., Ph.D. Diseases & Surgery of the Retina and Vitreous [facility]  I have reviewed the above documentation for accuracy and completeness, and I agree with the above. Gardiner Sleeper, M.D., Ph.D. 10/06/22 12:25 PM   Abbreviations: M myopia (nearsighted); A astigmatism; H hyperopia (farsighted); P presbyopia; Mrx spectacle prescription;  CTL contact lenses; OD right eye; OS left eye; OU both eyes  XT exotropia; ET esotropia; PEK punctate epithelial keratitis; PEE punctate epithelial erosions; DES dry eye syndrome; MGD meibomian gland dysfunction; ATs artificial tears; PFAT's preservative free artificial tears; [place] nuclear sclerotic cataract; PSC posterior subcapsular cataract; ERM epi-retinal membrane; PVD posterior vitreous detachment; RD retinal detachment; DM diabetes mellitus; DR diabetic retinopathy; NPDR non-proliferative diabetic retinopathy; PDR proliferative diabetic retinopathy; CSME clinically significant macular edema; DME diabetic macular edema; dbh dot blot hemorrhages; CWS cotton wool spot; POAG primary open angle glaucoma; C/D cup-to-disc ratio; HVF humphrey visual field; GVF goldmann visual field; OCT optical coherence tomography; IOP intraocular pressure; BRVO Branch retinal vein occlusion; CRVO central retinal vein occlusion; CRAO central retinal artery occlusion; BRAO branch retinal artery occlusion; RT retinal tear; SB scleral buckle; PPV pars plana vitrectomy; VH  Vitreous hemorrhage; PRP panretinal laser photocoagulation; IVK  intravitreal kenalog; VMT vitreomacular traction; MH Macular hole;  NVD neovascularization of the disc; NVE neovascularization elsewhere; AREDS age related eye disease study; ARMD age related macular degeneration; POAG primary open angle glaucoma; EBMD epithelial/anterior basement membrane dystrophy; ACIOL anterior chamber intraocular lens; IOL intraocular lens; PCIOL posterior chamber intraocular lens; Phaco/IOL phacoemulsification with intraocular lens placement; [place] photorefractive keratectomy; LASIK laser assisted in situ keratomileusis; HTN hypertension; DM diabetes mellitus; COPD chronic obstructive pulmonary disease

## 2022-09-23 ENCOUNTER — Telehealth: Payer: Self-pay

## 2022-09-23 ENCOUNTER — Ambulatory Visit (HOSPITAL_BASED_OUTPATIENT_CLINIC_OR_DEPARTMENT_OTHER)
Admission: RE | Admit: 2022-09-23 | Discharge: 2022-09-23 | Disposition: A | Discharge status: Home / Self Care | Payer: PPO | Source: Ambulatory Visit | Attending: Cardiovascular Disease | Admitting: Cardiovascular Disease

## 2022-09-23 ENCOUNTER — Encounter (HOSPITAL_COMMUNITY)
Admission: RE | Admit: 2022-09-23 | Discharge: 2022-09-23 | Disposition: A | Discharge status: Home / Self Care | Payer: PPO | Source: Ambulatory Visit | Attending: Cardiovascular Disease | Admitting: Cardiovascular Disease

## 2022-09-23 ENCOUNTER — Other Ambulatory Visit: Payer: PPO

## 2022-09-23 DIAGNOSIS — R7989 Other specified abnormal findings of blood chemistry: Secondary | ICD-10-CM | POA: Diagnosis not present

## 2022-09-23 DIAGNOSIS — R911 Solitary pulmonary nodule: Secondary | ICD-10-CM | POA: Diagnosis not present

## 2022-09-23 DIAGNOSIS — I350 Nonrheumatic aortic (valve) stenosis: Secondary | ICD-10-CM

## 2022-09-23 DIAGNOSIS — Z7689 Persons encountering health services in other specified circumstances: Secondary | ICD-10-CM | POA: Diagnosis not present

## 2022-09-23 MED ORDER — SODIUM CHLORIDE 0.9 % WEIGHT BASED INFUSION: 1.0000 mL/kg/h | INTRAVENOUS | Status: DC

## 2022-09-23 MED ORDER — SODIUM CHLORIDE 0.9 % WEIGHT BASED INFUSION
3.0000 mL/kg/h | INTRAVENOUS | Status: AC
  Administered 2022-09-23: 2.998 mL/kg/h via INTRAVENOUS

## 2022-09-23 MED ORDER — IOHEXOL 350 MG/ML SOLN
100.0000 mL | Freq: Once | INTRAVENOUS | Status: AC | PRN
  Administered 2022-09-23: 100 mL via INTRAVENOUS

## 2022-09-23 NOTE — Telephone Encounter (Signed)
-----   Message from Josue Hector, MD sent at 09/23/2022 12:31 PM EST ----- He is a candidate for TAVR valve Needs f/u Cr Monday post contrast would have him f/u with Magdalen Spatz or [place] to discuss and arrange cath

## 2022-09-23 NOTE — Telephone Encounter (Signed)
Patient will see Dr. Ali Lowe on Monday. Will order lab work for Monday as well.

## 2022-09-25 ENCOUNTER — Other Ambulatory Visit: Payer: Self-pay

## 2022-09-25 DIAGNOSIS — I350 Nonrheumatic aortic (valve) stenosis: Secondary | ICD-10-CM

## 2022-09-25 NOTE — Progress Notes (Unsigned)
Patient ID: Chad [name] MRN: [id number] DOB/AGE: 05-May-1946 77 y.o.  Primary Care Physician:Polite, Jori Moll, MD Primary Cardiologist: Jenkins Rouge, MD  FOCUSED CARDIOVASCULAR PROBLEM LIST:   1.  Severe aortic stenosis with an aortic valve area 0.84 cm grade, mean gradient 44 mmHg, and peak velocity of 4.39 m/s with normal ejection fraction; EKG demonstrates sinus bradycardia without bundle-branch blocks 2.  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  Hyperlipidemia 4.  Hypertension 5.  CKD stage IV  HISTORY OF PRESENT ILLNESS: The patient is a 77 y.o. male with the indicated medical history here for recommendations regarding progression of aortic stenosis.  The patient is here with his wife.  The patient denies any dyspnea, chest pain, presyncope, or syncope.  He has been using a reclining bicycle for some time.  He is able to do equivalent of 2000-2100 steps over a 30-minute period.  This has not changed over the last several years.  He fortunately has not required any emergency room visits or hospitalizations for cardiac issues including dyspnea, presyncope, or syncope.  He and his wife are avid travelers.  They have a son in [place] and another 1 in [place] whom they visit frequently.  He is scheduled to go on a cruise in [place] this July.  He went on a cruise last year.  The like to opt for low intensity activities on cruises.  He feels that if he exerted himself to a high degree he would likely be short of breath.  He sees a Pharmacist, community on a regular basis and reports very good dental health.  Past Medical History:  Diagnosis Date   Cataract    CKD (chronic kidney disease)    Colon polyps    Diabetes ([place])    Diabetic polyneuropathy associated with type 2 diabetes mellitus ([place]) 01/15/2017   Diabetic retinopathy (HCC)    GERD (gastroesophageal reflux disease)    Hypercholesterolemia    Hypertension    Hypertensive retinopathy    Hypothyroidism    Polyneuropathy     Proteinuria     History reviewed. No pertinent surgical history.  Family History  Problem Relation Age of Onset   Stroke Father    Glaucoma Father     Social History   Socioeconomic History   Marital status: Married    Spouse name: Not on file   Number of children: 2   Years of education: college   Highest education level: Not on file  Occupational History   Occupation: Associate Professor  Tobacco Use   Smoking status: Former   Smokeless tobacco: Never  Scientific laboratory technician Use: Never used  Substance and Sexual Activity   Alcohol use: Yes   Drug use: No   Sexual activity: Not Currently  Other Topics Concern   Not on file  Social History Narrative   Patient lives with wife in a 2 story home.  Has 2 children.  Works as a Associate Professor.  Education: college.       Right handed   Social Determinants of Health   Financial Resource Strain: Not on file  Food Insecurity: Not on file  Transportation Needs: Not on file  Physical Activity: Not on file  Stress: Not on file  Social Connections: Not on file  Intimate Partner Violence: Not on file     Prior to Admission medications   Medication Sig Start Date End Date Taking? Authorizing Provider  B-D ULTRAFINE III SHORT PEN 31G X 8 MM  [place] SMARTSIG:1 Each SUB-Q Daily 04/25/21   [provider]  Calcium Carb-Cholecalciferol (CALCIUM 500 + D) 500-125 MG-UNIT TABS Take 1 tablet by mouth daily.    [provider]  gabapentin (NEURONTIN) 300 MG capsule Take 2 tablet twice daily 08/24/22   Narda Amber K, DO  insulin glargine (LANTUS) 100 UNIT/ML injection Inject 10 Units into the skin daily.    [provider]  JARDIANCE 10 MG TABS tablet Take 10 mg by mouth daily. 11/11/20   [provider]  levothyroxine (SYNTHROID, LEVOTHROID) 75 MCG tablet Take 75 mcg by mouth daily before breakfast.  12/28/16   [provider]  losartan-hydrochlorothiazide (HYZAAR) 100-25 MG tablet  Take 1 tablet by mouth daily. 12/16/21   [provider]  metoprolol succinate (TOPROL-XL) 25 MG 24 hr tablet TAKE ONE TABLET BY MOUTH DAILY 03/24/22   Josue Hector, MD  nortriptyline (PAMELOR) 10 MG capsule TAKE THREE CAPSULES BY MOUTH EVERY NIGHT AT BEDTIME 04/20/22   Alda Berthold, DO  ONETOUCH VERIO test strip  04/24/21   [provider]  rosuvastatin (CRESTOR) 40 MG tablet Take 1 tablet by mouth daily.    [provider]    No Known Allergies  REVIEW OF SYSTEMS:  General: no fevers/chills/night sweats Eyes: no blurry vision, diplopia, or amaurosis ENT: no sore throat or hearing loss Resp: no cough, wheezing, or hemoptysis CV: no edema or palpitations GI: no abdominal pain, nausea, vomiting, diarrhea, or constipation GU: no dysuria, frequency, or hematuria Skin: no rash Neuro: no headache, numbness, tingling, or weakness of extremities Musculoskeletal: no joint pain or swelling Heme: no bleeding, DVT, or easy bruising Endo: no polydipsia or polyuria  BP (!) 162/80   Pulse (!) 59   Ht '5\' 8"'$  (1.727 m)   Wt 189 lb 3.2 oz (85.8 kg)   SpO2 98%   BMI 28.77 kg/m   PHYSICAL EXAM: GEN:  AO x 3 in no acute distress HEENT: normal Dentition: Normal Neck: JVP normal. +2 carotid upstrokes without bruits. No thyromegaly. Lungs: equal expansion, clear bilaterally CV: Apex is discrete and nondisplaced, RRR with 3/6 SEM Abd: soft, non-tender, non-distended; no bruit; positive bowel sounds Ext: no edema, ecchymoses, or cyanosis Vascular: 2+ femoral pulses, 2+ radial pulses       Skin: warm and dry without rash Neuro: CN II-XII grossly intact; motor and sensory grossly intact    DATA AND STUDIES:  EKG: Sinus bradycardia without bundle-branch blocks  2D ECHO: 2024 1. Left ventricular ejection fraction, by estimation, is 70 to 75%. The  left ventricle has hyperdynamic function. The left ventricle has no  regional wall motion abnormalities. Left  ventricular diastolic parameters  are consistent with Grade I diastolic  dysfunction (impaired relaxation).   2. Right ventricular systolic function is normal. The right ventricular  size is normal.   3. Left atrial size was moderately dilated.   4. The mitral valve is normal in structure. Mild mitral valve  regurgitation. No evidence of mitral stenosis.   5. The aortic valve is calcified. Aortic valve regurgitation is not  visualized. Severe aortic valve stenosis. Aortic valve area, by VTI  measures 0.84 cm. Aortic valve mean gradient measures 44.0 mmHg. Aortic  valve Vmax measures 4.39 m/s.   6. The inferior vena cava is normal in size with greater than 50%  respiratory variability, suggesting right atrial pressure of 3 mmHg.   CTA: 2024 1.  Calcified tri leaflet AV with score 3449 2. Annular area of 539  mm2 upper value for 26 mm Sapien 3 valve Sinuses small for a 34 mm Evolut 3.  Coronary arteries sufficient height above annulus for deployment 4.  Membranous septal length 8.25 mm 5.  Angiographic Angle for deployment LAO 14 Caudal 14 degrees 6.  Indeterminant lesions of the liver and right kidney. 7.  5 mm groundglass nodule in the left upper lobe of lung  CARDIAC CATH: n/a  STS RISK CALCULATOR: Pending  NHYA CLASS: 1    ASSESSMENT AND PLAN:   Nonrheumatic aortic valve stenosis - Plan: Pro b natriuretic peptide (BNP)  Type 2 diabetes mellitus with complication, with long-term current use of insulin (HCC)  Diabetic polyneuropathy associated with type 2 diabetes mellitus (HCC)  CKD (chronic kidney disease) stage 4, GFR 15-29 ml/min (HCC)  Hypertension associated with diabetes ([place])  Hyperlipidemia associated with type 2 diabetes mellitus ([place])  The patient has developed severe yet asymptomatic aortic stenosis.  This is likely also affected by the fact the patient has peripheral neuropathy from diabetes.  The patient is not having any high risk symptomatology and his  ejection fraction is normal.  I do long conversation with the patient and his wife about the particulars about his clinical scenario.  He feels relatively well and his quality of life is good.  I think for now we should continue to monitor the patient for symptoms or diminishing ejection fraction.  I did bring up with the patient and his wife the fact that the masses were demonstrated on his CT scan and his liver and kidney.  While he has had no unintentional weight loss I think given the fact we have time that we should evaluate this further.  His GFR is 28.  I will send my note to to his kidney specialist Dr. Joylene Grapes in regards to obtaining an abdominal MRI with and without gadolinium to evaluate these masses further.  Otherwise we will see the patient back in 3 months.  He is scheduled for an echocardiogram at that time as well.  I will check a BNP today.  There is some data to suggest an elevated BNP in asymptomatic patients could trigger improved outcomes in patients undergoing surgical but not transcatheter aortic valve replacement.  I discussed all this in great detail with the patient and his wife and all of their questions were answered.  His blood pressure is elevated today likely because he had his losartan held in preparation for the TAVR protocol CTA.  His blood pressure is elevated today.  I will start amlodipine 5 mg.    We will have to time aortic valve intervention carefully in this patient which will be determined by symptoms, ejection fraction, and his kidney function.   I have personally reviewed the patients imaging data as summarized above.  I have reviewed the natural history of aortic stenosis with the patient and family members who are present today. We have discussed the limitations of medical therapy and the poor prognosis associated with symptomatic aortic stenosis. We have also reviewed potential treatment options, including palliative medical therapy, conventional surgical aortic  valve replacement, and transcatheter aortic valve replacement. We discussed treatment options in the context of this patient's specific comorbid medical conditions.   All of the patient's questions were answered today. Will make further recommendations based on the results of studies outlined above.   Total time spent with patient today 60 minutes. This includes reviewing records, evaluating the patient and coordinating care.   Early Osmond, MD  09/28/2022  [facility] [address], [place], [place]  [address] Phone: [phone]; Fax: [phone]

## 2022-09-28 ENCOUNTER — Encounter: Payer: Self-pay | Admitting: Internal Medicine

## 2022-09-28 ENCOUNTER — Encounter: Payer: PPO | Attending: Internal Medicine | Admitting: Internal Medicine

## 2022-09-28 VITALS — BP 162/80 | HR 59 | Ht 68.0 in | Wt 189.2 lb

## 2022-09-28 DIAGNOSIS — H903 Sensorineural hearing loss, bilateral: Secondary | ICD-10-CM | POA: Diagnosis not present

## 2022-09-28 DIAGNOSIS — R7989 Other specified abnormal findings of blood chemistry: Secondary | ICD-10-CM | POA: Insufficient documentation

## 2022-09-28 DIAGNOSIS — E1169 Type 2 diabetes mellitus with other specified complication: Secondary | ICD-10-CM | POA: Diagnosis not present

## 2022-09-28 DIAGNOSIS — E1159 Type 2 diabetes mellitus with other circulatory complications: Secondary | ICD-10-CM | POA: Insufficient documentation

## 2022-09-28 DIAGNOSIS — E1142 Type 2 diabetes mellitus with diabetic polyneuropathy: Secondary | ICD-10-CM | POA: Diagnosis not present

## 2022-09-28 DIAGNOSIS — Z794 Long term (current) use of insulin: Secondary | ICD-10-CM | POA: Diagnosis not present

## 2022-09-28 DIAGNOSIS — E118 Type 2 diabetes mellitus with unspecified complications: Secondary | ICD-10-CM | POA: Diagnosis not present

## 2022-09-28 DIAGNOSIS — N184 Chronic kidney disease, stage 4 (severe): Secondary | ICD-10-CM

## 2022-09-28 DIAGNOSIS — E785 Hyperlipidemia, unspecified: Secondary | ICD-10-CM | POA: Insufficient documentation

## 2022-09-28 DIAGNOSIS — I350 Nonrheumatic aortic (valve) stenosis: Secondary | ICD-10-CM | POA: Diagnosis not present

## 2022-09-28 DIAGNOSIS — I152 Hypertension secondary to endocrine disorders: Secondary | ICD-10-CM

## 2022-09-28 MED ORDER — AMLODIPINE BESYLATE 5 MG PO TABS: 5.0000 mg | ORAL_TABLET | Freq: Every day | ORAL | 3 refills | Status: DC

## 2022-09-28 NOTE — Patient Instructions (Addendum)
Medication Instructions:  Your physician has recommended you make the following change in your medication:  1.) start amlodipine 5 mg - one tablet daily  *If you need a refill on your cardiac medications before your next appointment, please call your pharmacy*   Lab Work: Today: Pro BNP   Testing/Procedures: MRI abdomen - we will work on getting clearance from your kidney doctor before scheduling   Follow-Up: 3 months with Dr. Ali Lowe or Structural APP

## 2022-09-29 ENCOUNTER — Other Ambulatory Visit: Payer: Self-pay

## 2022-09-29 ENCOUNTER — Telehealth: Payer: Self-pay

## 2022-09-29 DIAGNOSIS — N2889 Other specified disorders of kidney and ureter: Secondary | ICD-10-CM

## 2022-09-29 DIAGNOSIS — R9389 Abnormal findings on diagnostic imaging of other specified body structures: Secondary | ICD-10-CM

## 2022-09-29 DIAGNOSIS — L570 Actinic keratosis: Secondary | ICD-10-CM | POA: Diagnosis not present

## 2022-09-29 DIAGNOSIS — I350 Nonrheumatic aortic (valve) stenosis: Secondary | ICD-10-CM

## 2022-09-29 DIAGNOSIS — D1801 Hemangioma of skin and subcutaneous tissue: Secondary | ICD-10-CM | POA: Diagnosis not present

## 2022-09-29 DIAGNOSIS — N289 Disorder of kidney and ureter, unspecified: Secondary | ICD-10-CM

## 2022-09-29 DIAGNOSIS — K769 Liver disease, unspecified: Secondary | ICD-10-CM

## 2022-09-29 DIAGNOSIS — L738 Other specified follicular disorders: Secondary | ICD-10-CM | POA: Diagnosis not present

## 2022-09-29 LAB — CBC
Hematocrit: 36.7 % — ABNORMAL LOW (ref 37.5–51.0)
Hemoglobin: 12.1 g/dL — ABNORMAL LOW (ref 13.0–17.7)
MCH: 30.9 pg (ref 26.6–33.0)
MCHC: 33 g/dL (ref 31.5–35.7)
MCV: 94 fL (ref 79–97)
Platelets: 159 10*3/uL (ref 150–450)
RBC: 3.91 x10E6/uL — ABNORMAL LOW (ref 4.14–5.80)
RDW: 12.2 % (ref 11.6–15.4)
WBC: 5.9 10*3/uL (ref 3.4–10.8)

## 2022-09-29 LAB — BASIC METABOLIC PANEL
BUN/Creatinine Ratio: 15 (ref 10–24)
BUN: 32 mg/dL — ABNORMAL HIGH (ref 8–27)
CO2: 20 mmol/L (ref 20–29)
Calcium: 9.4 mg/dL (ref 8.6–10.2)
Chloride: 103 mmol/L (ref 96–106)
Creatinine, Ser: 2.16 mg/dL — ABNORMAL HIGH (ref 0.76–1.27)
Glucose: 135 mg/dL — ABNORMAL HIGH (ref 70–99)
Potassium: 4.7 mmol/L (ref 3.5–5.2)
Sodium: 141 mmol/L (ref 134–144)
eGFR: 31 mL/min/{1.73_m2} — ABNORMAL LOW (ref >59)

## 2022-09-29 NOTE — Telephone Encounter (Signed)
Early Osmond, MD  Reesa Chew, MD; Barkley Boards, RN Thanks so much!  I really appreciate it.  Akiera Allbaugh, let's go ahead and get that abdominal MRI with and without gad to evaluate those masses.  -AT       Previous Messages    ----- Message ----- From: Reesa Chew, MD Sent: 09/29/2022   9:16 AM EST To: Early Osmond, MD Subject: abnormal ct scan                              Antony Madura,  I got your message about this patient.  Thanks for keeping me in the loop.  He should be fine to get an MRI based on his GFR. If you need anything else just let us know.  Best,  [facility]     Order placed for MRI Abdomen. Per radiology scheduling, MRI is scheduled on Wednesday, October 07, 2022. Pt should arrive at 2:30 PM, [facility] for Radiology check in.  MRI is scheduled to start at 3:00 PM.  Pt should not have anything to eat or drink after 11:00 AM. I will contact the pt by phone with this appointment and also send him a message in my chart.

## 2022-09-30 LAB — PRO B NATRIURETIC PEPTIDE: NT-Pro BNP: 541 pg/mL — ABNORMAL HIGH (ref 0–486)

## 2022-10-02 ENCOUNTER — Encounter (INDEPENDENT_AMBULATORY_CARE_PROVIDER_SITE_OTHER): Payer: Self-pay | Admitting: Ophthalmology

## 2022-10-06 ENCOUNTER — Ambulatory Visit (INDEPENDENT_AMBULATORY_CARE_PROVIDER_SITE_OTHER): Payer: PPO | Admitting: Ophthalmology

## 2022-10-06 ENCOUNTER — Encounter (INDEPENDENT_AMBULATORY_CARE_PROVIDER_SITE_OTHER): Payer: Self-pay | Admitting: Ophthalmology

## 2022-10-06 DIAGNOSIS — H25813 Combined forms of age-related cataract, bilateral: Secondary | ICD-10-CM | POA: Diagnosis not present

## 2022-10-06 DIAGNOSIS — I10 Essential (primary) hypertension: Secondary | ICD-10-CM

## 2022-10-06 DIAGNOSIS — E113313 Type 2 diabetes mellitus with moderate nonproliferative diabetic retinopathy with macular edema, bilateral: Secondary | ICD-10-CM | POA: Diagnosis not present

## 2022-10-06 DIAGNOSIS — H35012 Changes in retinal vascular appearance, left eye: Secondary | ICD-10-CM

## 2022-10-06 DIAGNOSIS — H35033 Hypertensive retinopathy, bilateral: Secondary | ICD-10-CM

## 2022-10-06 MED ORDER — AFLIBERCEPT 2MG/0.05ML IZ SOLN FOR KALEIDOSCOPE
2.0000 mg | INTRAVITREAL | Status: AC | PRN
  Administered 2022-10-06: 2 mg via INTRAVITREAL

## 2022-10-07 ENCOUNTER — Ambulatory Visit (HOSPITAL_COMMUNITY)
Admission: RE | Admit: 2022-10-07 | Discharge: 2022-10-07 | Disposition: A | Discharge status: Home / Self Care | Payer: PPO | Source: Ambulatory Visit | Attending: Internal Medicine | Admitting: Internal Medicine

## 2022-10-07 DIAGNOSIS — K769 Liver disease, unspecified: Secondary | ICD-10-CM

## 2022-10-07 DIAGNOSIS — R9389 Abnormal findings on diagnostic imaging of other specified body structures: Secondary | ICD-10-CM | POA: Diagnosis not present

## 2022-10-07 DIAGNOSIS — K7689 Other specified diseases of liver: Secondary | ICD-10-CM | POA: Diagnosis not present

## 2022-10-07 DIAGNOSIS — N289 Disorder of kidney and ureter, unspecified: Secondary | ICD-10-CM | POA: Diagnosis not present

## 2022-10-07 MED ORDER — GADOBUTROL 1 MMOL/ML IV SOLN
8.5000 mL | Freq: Once | INTRAVENOUS | Status: AC | PRN
  Administered 2022-10-07: 8.5 mL via INTRAVENOUS

## 2022-11-02 ENCOUNTER — Encounter: Payer: Self-pay | Admitting: Neurology

## 2022-11-16 ENCOUNTER — Encounter (INDEPENDENT_AMBULATORY_CARE_PROVIDER_SITE_OTHER): Payer: PPO | Admitting: Ophthalmology

## 2022-11-20 ENCOUNTER — Encounter (INDEPENDENT_AMBULATORY_CARE_PROVIDER_SITE_OTHER): Payer: PPO | Admitting: Ophthalmology

## 2022-11-20 DIAGNOSIS — H25813 Combined forms of age-related cataract, bilateral: Secondary | ICD-10-CM

## 2022-11-20 DIAGNOSIS — H35033 Hypertensive retinopathy, bilateral: Secondary | ICD-10-CM

## 2022-11-20 DIAGNOSIS — I10 Essential (primary) hypertension: Secondary | ICD-10-CM

## 2022-11-20 DIAGNOSIS — E113313 Type 2 diabetes mellitus with moderate nonproliferative diabetic retinopathy with macular edema, bilateral: Secondary | ICD-10-CM

## 2022-11-20 DIAGNOSIS — H35012 Changes in retinal vascular appearance, left eye: Secondary | ICD-10-CM

## 2022-11-23 NOTE — Progress Notes (Signed)
[facility] - Clinic Note  11/25/2022    CHIEF COMPLAINT Patient presents for Retina Follow Up  HISTORY OF PRESENT ILLNESS: Chad [name] is a 77 y.o. male who presents to the clinic today for:   HPI     Retina Follow Up   Patient presents with  Diabetic Retinopathy.  In both eyes.  This started 6 weeks ago.  Duration of 6 weeks.  Since onset it is stable.  I, the attending physician,  performed the HPI with the patient and updated documentation appropriately.        Comments   6 week retina follow up NPDR and I'VE ou pt is reporting no vision changes noticed he denies any flashes or floaters his last reading 150 this morning       Last edited by Rennis Chris, MD on 11/25/2022  5:28 PM.    Patient feels that the vision is doing well.   Referring physician: Renford Dills, MD [address] [place],  [place] [address]  HISTORICAL INFORMATION:   Selected notes from the MEDICAL RECORD NUMBER Referred by Dr. Emily Filbert for diabetic retinopathy with DME OU [name]:  Ocular Hx- PMH-    CURRENT MEDICATIONS: No current outpatient medications on file. (Ophthalmic Drugs)   No current facility-administered medications for this visit. (Ophthalmic Drugs)   Current Outpatient Medications (Other)  Medication Sig   amLODipine (NORVASC) 5 MG tablet Take 1 tablet (5 mg total) by mouth daily.   Ascorbic Acid (VITAMIN C) 500 MG CAPS 1 tablet Orally Once a day   B-D ULTRAFINE III SHORT PEN 31G X 8 MM MISC SMARTSIG:1 Each SUB-Q Daily   Calcium Carb-Cholecalciferol (CALCIUM 500 + D) 500-125 MG-UNIT TABS Take 1 tablet by mouth daily.   Cyanocobalamin 5000 MCG TBDP 1 tablet Sublingual Once a day   gabapentin (NEURONTIN) 300 MG capsule Take 2 tablet twice daily   insulin glargine (LANTUS) 100 UNIT/ML injection Inject 10 Units into the skin daily.   JARDIANCE 10 MG TABS tablet Take 10 mg by mouth daily.   levothyroxine (SYNTHROID, LEVOTHROID) 75 MCG tablet Take 75 mcg  by mouth daily before breakfast.    losartan-hydrochlorothiazide (HYZAAR) 100-25 MG tablet Take 1 tablet by mouth daily. (Patient not taking: Reported on 09/28/2022)   metoprolol succinate (TOPROL-XL) 25 MG 24 hr tablet TAKE ONE TABLET BY MOUTH DAILY   nortriptyline (PAMELOR) 10 MG capsule TAKE THREE CAPSULES BY MOUTH EVERY NIGHT AT BEDTIME   ONETOUCH VERIO test strip    rosuvastatin (CRESTOR) 40 MG tablet Take 1 tablet by mouth daily.   No current facility-administered medications for this visit. (Other)   REVIEW OF SYSTEMS: ROS   Positive for: Endocrine, Cardiovascular, Eyes Negative for: Constitutional, Gastrointestinal, Neurological, Skin, Genitourinary, Musculoskeletal, HENT, Respiratory, Psychiatric, Allergic/Imm, Heme/Lymph Last edited by Etheleen Mayhew, COT on 11/25/2022  2:30 PM.        ALLERGIES No Known Allergies  PAST MEDICAL HISTORY Past Medical History:  Diagnosis Date   Cataract    CKD (chronic kidney disease)    Colon polyps    Diabetes (HCC)    Diabetic polyneuropathy associated with type 2 diabetes mellitus (HCC) 01/15/2017   Diabetic retinopathy (HCC)    GERD (gastroesophageal reflux disease)    Hypercholesterolemia    Hypertension    Hypertensive retinopathy    Hypothyroidism    Polyneuropathy    Proteinuria    History reviewed. No pertinent surgical history.  FAMILY HISTORY Family History  Problem  Relation Age of Onset   Stroke Father    Glaucoma Father    SOCIAL HISTORY Social History   Tobacco Use   Smoking status: Former   Smokeless tobacco: Never  Building services engineer Use: Never used  Substance Use Topics   Alcohol use: Yes   Drug use: No       OPHTHALMIC EXAM: Base Eye Exam     Visual Acuity (Snellen - Linear)       Right Left   Dist [place] 20/30 -3 20/40   Dist ph [place] NI 20/30 +2         Tonometry (Tonopen, 2:34 PM)       Right Left   Pressure 14 14         Pupils       Pupils Dark Light Shape React APD    Right PERRL 3 2 Round Brisk None   Left PERRL 3 2 Round Brisk None         Visual Fields       Left Right    Full Full         Extraocular Movement       Right Left    Full, Ortho Full, Ortho         Neuro/Psych     Oriented x3: Yes   Mood/Affect: Normal         Dilation     Both eyes: 2.5% Phenylephrine @ 2:34 PM           Slit Lamp and Fundus Exam     External Exam       Right Left   External Normal Normal         Slit Lamp Exam       Right Left   Lids/Lashes Dermatochalasis - upper lid, mild Telangiectasia Dermatochalasis - upper lid, Meibomian gland dysfunction   Conjunctiva/Sclera White and quiet White and quiet   Cornea 2+ fine, inferior Punctate epithelial erosions 1+ Punctate epithelial erosions, mild tear film debris   Anterior Chamber deep, clear, narrow angles deep, clear, narrow angles   Iris Round and dilated, mild anterior bowing, No NVI Round and dilated, mild anterior bowing, No NVI   Lens 2-3+ Nuclear sclerosis, 2+ Cortical cataract 2-3+ Nuclear sclerosis, 2+ Cortical cataract   Anterior Vitreous Vitreous syneresis Vitreous syneresis         Fundus Exam       Right Left   Disc Pink and Sharp, no NVD Pink and Sharp, no NVD   C/D Ratio 0.3 0.3   Macula good foveal reflex, mild ERM, minimal MA and exudates, persistent central cystic changes -- improved Flat, good foveal reflex, minimal MA and focal exudates, trace residual cystic changes superior mac -- improved   Vessels attenuated, Tortuous attenuated, Tortuous, Copper wiring, Macroaneurysm / CWS along IT arcades - improving   Periphery Attached, scattered MA and CWS greatest posteriorly Attached, scattered MA/DBH greatest posteriorly, focal CWS inferior to IT arcades--fading           IMAGING AND PROCEDURES  Imaging and Procedures for 11/25/2022  OCT, Retina - OU - Both Eyes       Right Eye Quality was good. Central Foveal Thickness: 292. Progression has improved.  Findings include no SRF, abnormal foveal contour, intraretinal hyper-reflective material, epiretinal membrane, intraretinal fluid, macular pucker (Trace IRF / IRHM temporal macula and fovea -- slightly improved).   Left Eye Quality was good. Central Foveal Thickness: 267. Progression has improved. Findings  include normal foveal contour, no SRF, intraretinal hyper-reflective material, intraretinal fluid (Stable improvement IRF/IRHM centrally, trace persistent cystic changes sup mac -- slightly improved).   Notes *Images captured and stored on drive  Diagnosis / Impression:  +DME OU OD: Trace IRF / IRHM temporal macula and fovea -- slightly improved OS: Stable improvement IRF/IRHM centrally, trace persistent cystic changes sup mac -- slightly improved  Clinical management:  See below  Abbreviations: NFP - Normal foveal profile. CME - cystoid macular edema. PED - pigment epithelial detachment. IRF - intraretinal fluid. SRF - subretinal fluid. EZ - ellipsoid zone. ERM - epiretinal membrane. ORA - outer retinal atrophy. ORT - outer retinal tubulation. SRHM - subretinal hyper-reflective material. IRHM - intraretinal hyper-reflective material      Intravitreal Injection, Pharmacologic Agent - OD - Right Eye       Time Out 11/25/2022. 3:21 PM. Confirmed correct patient, procedure, site, and patient consented.   Anesthesia Topical anesthesia was used. Anesthetic medications included Lidocaine 2%, Proparacaine 0.5%.   Procedure Preparation included 5% betadine to ocular surface, eyelid speculum. A (32g) needle was used.   Injection: 2 mg aflibercept 2 MG/0.05ML   Route: Intravitreal, Site: Right Eye   NDC: L6038910, Lot: [id number], Expiration date: 11/24/2023, Waste: 0 mL   Post-op Post injection exam found visual acuity of at least counting fingers. The patient tolerated the procedure well. There were no complications. The patient received written and verbal post procedure care  education. Post injection medications were not given.      Intravitreal Injection, Pharmacologic Agent - OS - Left Eye       Time Out 11/25/2022. 3:21 PM. Confirmed correct patient, procedure, site, and patient consented.   Anesthesia Topical anesthesia was used. Anesthetic medications included Lidocaine 2%, Proparacaine 0.5%.   Procedure Preparation included 5% betadine to ocular surface, eyelid speculum. A (32g) needle was used.   Injection: 2 mg aflibercept 2 MG/0.05ML   Route: Intravitreal, Site: Left Eye   NDC: L6038910, Lot: [id number], Expiration date: 05/27/2023, Waste: 0 mL   Post-op Post injection exam found visual acuity of at least counting fingers. The patient tolerated the procedure well. There were no complications. The patient received written and verbal post procedure care education. Post injection medications were not given.            ASSESSMENT/PLAN:    ICD-10-CM   1. 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 Essential hypertension  I10     4. Hypertensive retinopathy of both eyes  H35.033     5. Combined forms of age-related cataract of both eyes  H25.813      1. Moderate Non-proliferative diabetic retinopathy w/ DME OU  - delayed f/u -- 7 wks instead of 6  - s/p IVA OD #1 (11.17.22) - s/p IVA OS #1 (11.14.22), #7 (05.12.23), #8 (06.09.23) - s/p IVA OU #2 (12.15.22), #3 (01.12.23), #4 (02.13.23), #5 (03.16.23), #6 (04.12.23),   **IVA resistance OU** - s/p IVE OD #1 (05.12.23) -- sample, #2 (06.09.23), #3 (07.07.23), #4 (08.04.23), #5 (09.22.23), #6 (11.07.23), #7 (11.07.23),  #  8 (01.30.24), #9 (03.12.24) - s/p IVE OS #1 (07.07.23), #2 (08.04.23), #3 (09.22.23), #4 (11.07.23),#5 (11.07.23), #6 (01.30.24), #7 (03.12.24) - exam shows scattered MA/DBH OU, +edema and exudates - FA (11.14.22) shows vascular perfusion defects OU, no NV OU, OS with focal macroaneurysm along IT arcades - OCT shows OD: Trace IRF / IRHM temporal macula and fovea -- slightly improved OS: Stable improvement IRF/IRHM centrally, trace persistent cystic changes sup mac -- slightly improved at 7 weeks  - BCVA 20/30 OU -- stable - recommend IVE OU today (OD #10, OS #8) 05.01.24 with f/u at 7 weeks - pt wishes to proceed with injections - RBA of procedure discussed, questions answered - IVA informed consent obtained and signed OU, 11.14.22 - IVE informed consent obtained and signed OU, 05.13.23 - see procedure note - f/u in 7 weeks, DFE, OCT, possible injection(s)  2. Retinal macroaneurysm OS  - focal lesion along IT arcades -- regressed  - s/p IVE OU as above  3,4. Hypertensive retinopathy OU - discussed importance of tight BP control - continue to monitor  5.  Mixed Cataract OU  - being monitored by Dr. Emily Filbert - The symptoms of cataract, surgical options, and treatments and risks were discussed with patient.  - discussed diagnosis and progression - approaching visual significance   Ophthalmic Meds Ordered this visit:  Meds ordered this encounter  Medications   aflibercept (EYLEA) SOLN 2 mg   aflibercept (EYLEA) SOLN 2 mg     Return in about 7 weeks (around 01/13/2023) for f/u Mod NPDR OU, DFE, OCT, Possible, IVE, OU.  There are no Patient Instructions on file for this visit.   Explained the diagnoses, plan, and follow up with the patient and they expressed understanding.  Patient expressed understanding of the importance of proper follow up care.   This document serves as a record of services personally performed by Karie Chimera, MD, PhD. It was created on their behalf by  De Blanch, an ophthalmic technician. The creation of this record is the provider's dictation and/or activities during the visit.    Electronically signed by: De Blanch, OA, 11/27/22  2:09 PM  This document serves as a record of services personally performed by Karie Chimera, MD, PhD. It was created on their behalf by Gerilyn Nestle, COT an ophthalmic technician. The creation of this record is the provider's dictation and/or activities during the visit.    Electronically signed by:  Gerilyn Nestle, COT  05.01.24 2:09 PM  Karie Chimera, M.D., Ph.D. Diseases & Surgery of the Retina and Vitreous [facility]  I have reviewed the above documentation for accuracy and completeness, and I agree with the above. Karie Chimera, M.D., Ph.D. 11/27/22 2:10 PM   Abbreviations: M myopia (nearsighted); A astigmatism; H hyperopia (farsighted); P presbyopia; Mrx spectacle prescription;  CTL contact lenses; OD right eye; OS left eye; OU both eyes  XT exotropia; ET esotropia; PEK punctate epithelial keratitis; PEE punctate epithelial erosions; DES dry eye syndrome; MGD meibomian gland dysfunction; ATs artificial tears; PFAT's preservative free artificial tears; NSC nuclear sclerotic cataract; PSC posterior subcapsular cataract; ERM epi-retinal membrane; PVD posterior vitreous detachment; RD retinal detachment; DM diabetes mellitus; DR diabetic retinopathy; NPDR non-proliferative diabetic retinopathy; PDR proliferative diabetic retinopathy; CSME clinically significant macular edema; DME diabetic macular edema; dbh dot blot hemorrhages; CWS cotton wool spot; POAG primary open angle glaucoma; C/D cup-to-disc ratio; HVF humphrey visual field; GVF [name] visual field; OCT optical coherence tomography; IOP intraocular  pressure; BRVO Branch retinal vein occlusion; CRVO central retinal vein occlusion; CRAO central retinal artery occlusion; BRAO branch retinal artery occlusion;  RT retinal tear; SB scleral buckle; PPV pars plana vitrectomy; VH Vitreous hemorrhage; PRP panretinal laser photocoagulation; IVK intravitreal kenalog; VMT vitreomacular traction; MH Macular hole;  NVD neovascularization of the disc; NVE neovascularization elsewhere; AREDS age related eye disease study; ARMD age related macular degeneration; POAG primary open angle glaucoma; EBMD epithelial/anterior basement membrane dystrophy; ACIOL anterior chamber intraocular lens; IOL intraocular lens; PCIOL posterior chamber intraocular lens; Phaco/IOL phacoemulsification with intraocular lens placement; PRK photorefractive keratectomy; LASIK laser assisted in situ keratomileusis; HTN hypertension; DM diabetes mellitus; COPD chronic obstructive pulmonary disease

## 2022-11-24 ENCOUNTER — Encounter (INDEPENDENT_AMBULATORY_CARE_PROVIDER_SITE_OTHER): Payer: PPO | Admitting: Ophthalmology

## 2022-11-24 DIAGNOSIS — H25813 Combined forms of age-related cataract, bilateral: Secondary | ICD-10-CM

## 2022-11-24 DIAGNOSIS — H35033 Hypertensive retinopathy, bilateral: Secondary | ICD-10-CM

## 2022-11-24 DIAGNOSIS — E113313 Type 2 diabetes mellitus with moderate nonproliferative diabetic retinopathy with macular edema, bilateral: Secondary | ICD-10-CM

## 2022-11-24 DIAGNOSIS — H35012 Changes in retinal vascular appearance, left eye: Secondary | ICD-10-CM

## 2022-11-24 DIAGNOSIS — I10 Essential (primary) hypertension: Secondary | ICD-10-CM

## 2022-11-25 ENCOUNTER — Encounter (INDEPENDENT_AMBULATORY_CARE_PROVIDER_SITE_OTHER): Payer: Self-pay | Admitting: Ophthalmology

## 2022-11-25 ENCOUNTER — Ambulatory Visit (INDEPENDENT_AMBULATORY_CARE_PROVIDER_SITE_OTHER): Payer: PPO | Admitting: Ophthalmology

## 2022-11-25 DIAGNOSIS — E113313 Type 2 diabetes mellitus with moderate nonproliferative diabetic retinopathy with macular edema, bilateral: Secondary | ICD-10-CM

## 2022-11-25 DIAGNOSIS — I10 Essential (primary) hypertension: Secondary | ICD-10-CM

## 2022-11-25 DIAGNOSIS — H35033 Hypertensive retinopathy, bilateral: Secondary | ICD-10-CM

## 2022-11-25 DIAGNOSIS — H35012 Changes in retinal vascular appearance, left eye: Secondary | ICD-10-CM | POA: Diagnosis not present

## 2022-11-25 DIAGNOSIS — H25813 Combined forms of age-related cataract, bilateral: Secondary | ICD-10-CM

## 2022-11-25 MED ORDER — AFLIBERCEPT 2MG/0.05ML IZ SOLN FOR KALEIDOSCOPE
2.0000 mg | INTRAVITREAL | Status: AC | PRN
  Administered 2022-11-25: 2 mg via INTRAVITREAL

## 2022-12-31 DIAGNOSIS — N1832 Chronic kidney disease, stage 3b: Secondary | ICD-10-CM | POA: Diagnosis not present

## 2023-01-05 DIAGNOSIS — E039 Hypothyroidism, unspecified: Secondary | ICD-10-CM | POA: Diagnosis not present

## 2023-01-05 DIAGNOSIS — E785 Hyperlipidemia, unspecified: Secondary | ICD-10-CM | POA: Diagnosis not present

## 2023-01-05 DIAGNOSIS — E1140 Type 2 diabetes mellitus with diabetic neuropathy, unspecified: Secondary | ICD-10-CM | POA: Diagnosis not present

## 2023-01-05 DIAGNOSIS — N184 Chronic kidney disease, stage 4 (severe): Secondary | ICD-10-CM | POA: Diagnosis not present

## 2023-01-05 DIAGNOSIS — I350 Nonrheumatic aortic (valve) stenosis: Secondary | ICD-10-CM | POA: Diagnosis not present

## 2023-01-05 DIAGNOSIS — I129 Hypertensive chronic kidney disease with stage 1 through stage 4 chronic kidney disease, or unspecified chronic kidney disease: Secondary | ICD-10-CM | POA: Diagnosis not present

## 2023-01-05 DIAGNOSIS — E1122 Type 2 diabetes mellitus with diabetic chronic kidney disease: Secondary | ICD-10-CM | POA: Diagnosis not present

## 2023-01-06 NOTE — Progress Notes (Signed)
[facility] - Clinic Note  01/13/2023    CHIEF COMPLAINT Patient presents for Retina Follow Up  HISTORY OF PRESENT ILLNESS: Chad [name] is a 77 y.o. male who presents to the clinic today for:   HPI     Retina Follow Up   Patient presents with  Diabetic Retinopathy.  In both eyes.  This started 7 weeks ago.  I, the attending physician,  performed the HPI with the patient and updated documentation appropriately.        Comments   Patient here for 7 weeks retina follow up for NPDR OU. Patient states vision about the same. No improvement no decline.      Last edited by Rennis Chris, MD on 01/13/2023  5:05 PM.      Referring physician: Renford Dills, MD [address] [place],  [place] [address]  HISTORICAL INFORMATION:   Selected notes from the MEDICAL RECORD NUMBER Referred by Dr. Emily Filbert for diabetic retinopathy with DME OU [name]:  Ocular Hx- PMH-    CURRENT MEDICATIONS: No current outpatient medications on file. (Ophthalmic Drugs)   No current facility-administered medications for this visit. (Ophthalmic Drugs)   Current Outpatient Medications (Other)  Medication Sig   amLODipine (NORVASC) 5 MG tablet Take 1 tablet (5 mg total) by mouth daily.   Ascorbic Acid (VITAMIN C) 500 MG CAPS 1 tablet Orally Once a day   B-D ULTRAFINE III SHORT PEN 31G X 8 MM MISC SMARTSIG:1 Each SUB-Q Daily   Calcium Carb-Cholecalciferol (CALCIUM 500 + D) 500-125 MG-UNIT TABS Take 1 tablet by mouth daily.   Cyanocobalamin 5000 MCG TBDP 1 tablet Sublingual Once a day   gabapentin (NEURONTIN) 300 MG capsule Take 2 tablet twice daily   insulin glargine (LANTUS) 100 UNIT/ML injection Inject 10 Units into the skin daily.   JARDIANCE 10 MG TABS tablet Take 10 mg by mouth daily.   levothyroxine (SYNTHROID, LEVOTHROID) 75 MCG tablet Take 75 mcg by mouth daily before breakfast.    metoprolol succinate (TOPROL-XL) 25 MG 24 hr tablet TAKE ONE TABLET BY MOUTH DAILY    nortriptyline (PAMELOR) 10 MG capsule TAKE THREE CAPSULES BY MOUTH EVERY NIGHT AT BEDTIME   ONETOUCH VERIO test strip    rosuvastatin (CRESTOR) 40 MG tablet Take 1 tablet by mouth daily.   losartan-hydrochlorothiazide (HYZAAR) 100-25 MG tablet Take 1 tablet by mouth daily. (Patient not taking: Reported on 01/11/2023)   No current facility-administered medications for this visit. (Other)   REVIEW OF SYSTEMS: ROS   Positive for: Endocrine, Cardiovascular, Eyes Negative for: Constitutional, Gastrointestinal, Neurological, Skin, Genitourinary, Musculoskeletal, HENT, Respiratory, Psychiatric, Allergic/Imm, Heme/Lymph Last edited by Laddie Aquas, COA on 01/13/2023  2:28 PM.     ALLERGIES No Known Allergies  PAST MEDICAL HISTORY Past Medical History:  Diagnosis Date   Cataract    CKD (chronic kidney disease)    Colon polyps    Diabetes (HCC)    Diabetic polyneuropathy associated with type 2 diabetes mellitus (HCC) 01/15/2017   Diabetic retinopathy (HCC)    GERD (gastroesophageal reflux disease)    Hypercholesterolemia    Hypertension    Hypertensive retinopathy    Hypothyroidism    Polyneuropathy    Proteinuria    History reviewed. No pertinent surgical history.  FAMILY HISTORY Family History  Problem Relation Age of Onset   Stroke Father    Glaucoma Father    SOCIAL HISTORY Social History   Tobacco Use   Smoking status: Former  Smokeless tobacco: Never  Vaping Use   Vaping Use: Never used  Substance Use Topics   Alcohol use: Yes   Drug use: No       OPHTHALMIC EXAM: Base Eye Exam     Visual Acuity (Snellen - Linear)       Right Left   Dist [place] 20/30 -2 20/40 -2   Dist ph [place]  20/30 -1         Tonometry (Tonopen, 2:26 PM)       Right Left   Pressure 12 12         Pupils       Dark Light Shape React APD   Right 3 2 Round Brisk None   Left 3 2 Round Brisk None         Visual Fields (Counting fingers)       Left Right    Full Full          Extraocular Movement       Right Left    Full, Ortho Full, Ortho         Neuro/Psych     Oriented x3: Yes   Mood/Affect: Normal         Dilation     Both eyes: 1.0% Mydriacyl, 2.5% Phenylephrine @ 2:26 PM           Slit Lamp and Fundus Exam     External Exam       Right Left   External Normal Normal         Slit Lamp Exam       Right Left   Lids/Lashes Dermatochalasis - upper lid, mild Telangiectasia Dermatochalasis - upper lid, Meibomian gland dysfunction   Conjunctiva/Sclera White and quiet White and quiet   Cornea 2+ fine, inferior Punctate epithelial erosions 1+ Punctate epithelial erosions, mild tear film debris   Anterior Chamber deep, clear, narrow angles deep, clear, narrow angles   Iris Round and dilated, mild anterior bowing, No NVI Round and dilated, mild anterior bowing, No NVI   Lens 2-3+ Nuclear sclerosis, 2+ Cortical cataract 2-3+ Nuclear sclerosis, 2+ Cortical cataract   Anterior Vitreous Vitreous syneresis Vitreous syneresis         Fundus Exam       Right Left   Disc Pink and Sharp, no NVD Pink and Sharp, no NVD   C/D Ratio 0.3 0.3   Macula good foveal reflex, mild ERM, minimal MA and exudates, central cystic changes -- improved Flat, good foveal reflex, minimal MA and focal exudates, trace residual cystic changes superior mac -- improved   Vessels attenuated, Tortuous attenuated, Tortuous, Copper wiring, Macroaneurysm / CWS along IT arcades - improving   Periphery Attached, scattered MA and CWS greatest posteriorly Attached, scattered MA/DBH greatest posteriorly, focal CWS inferior to IT arcades--fading           IMAGING AND PROCEDURES  Imaging and Procedures for 01/13/2023  OCT, Retina - OU - Both Eyes       Right Eye Quality was good. Central Foveal Thickness: 285. Progression has improved. Findings include no IRF, no SRF, abnormal foveal contour, intraretinal hyper-reflective material, epiretinal membrane, macular  pucker (Trace IRF / IRHM temporal macula and fovea -- improved -- just trace cystic changes remain).   Left Eye Quality was good. Central Foveal Thickness: 265. Progression has improved. Findings include normal foveal contour, no SRF, intraretinal hyper-reflective material, intraretinal fluid (Stable improvement IRF/IRHM centrally, trace persistent cystic changes sup mac -- slightly improved).  Notes *Images captured and stored on drive  Diagnosis / Impression:  +DME OU OD: Trace IRF / IRHM temporal macula and fovea -- improved -- just trace cystic changes remain OS: Stable improvement IRF/IRHM centrally, trace persistent cystic changes sup mac -- slightly improved  Clinical management:  See below  Abbreviations: NFP - Normal foveal profile. CME - cystoid macular edema. PED - pigment epithelial detachment. IRF - intraretinal fluid. SRF - subretinal fluid. EZ - ellipsoid zone. ERM - epiretinal membrane. ORA - outer retinal atrophy. ORT - outer retinal tubulation. SRHM - subretinal hyper-reflective material. IRHM - intraretinal hyper-reflective material      Intravitreal Injection, Pharmacologic Agent - OD - Right Eye       Time Out 01/13/2023. 2:55 PM. Confirmed correct patient, procedure, site, and patient consented.   Anesthesia Topical anesthesia was used. Anesthetic medications included Lidocaine 2%, Proparacaine 0.5%.   Procedure Preparation included 5% betadine to ocular surface, eyelid speculum. A (32g) needle was used.   Injection: 2 mg aflibercept 2 MG/0.05ML   Route: Intravitreal, Site: Right Eye   NDC: L6038910, Lot: [id number], Expiration date: 12/25/2023, Waste: 0 mL   Post-op Post injection exam found visual acuity of at least counting fingers. The patient tolerated the procedure well. There were no complications. The patient received written and verbal post procedure care education. Post injection medications were not given.      Intravitreal Injection,  Pharmacologic Agent - OS - Left Eye       Time Out 01/13/2023. 2:55 PM. Confirmed correct patient, procedure, site, and patient consented.   Anesthesia Topical anesthesia was used. Anesthetic medications included Lidocaine 2%, Proparacaine 0.5%.   Procedure Preparation included 5% betadine to ocular surface, eyelid speculum. A (32g) needle was used.   Injection: 2 mg aflibercept 2 MG/0.05ML   Route: Intravitreal, Site: Left Eye   NDC: L6038910, Lot: [id number], Expiration date: 12/25/2023, Waste: 0 mL   Post-op Post injection exam found visual acuity of at least counting fingers. The patient tolerated the procedure well. There were no complications. The patient received written and verbal post procedure care education. Post injection medications were not given.            ASSESSMENT/PLAN:    ICD-10-CM   1. 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 Essential hypertension  I10     4. Hypertensive retinopathy of both eyes  H35.033     5. Combined forms of age-related cataract of both eyes  H25.813      1. Moderate Non-proliferative diabetic retinopathy w/ DME OU  - delayed f/u -- 7 wks instead of 6  - s/p IVA OD #1 (11.17.22) - s/p IVA OS #1 (11.14.22), #7 (05.12.23), #8 (06.09.23) - s/p IVA OU #2 (12.15.22), #3 (01.12.23), #4 (02.13.23), #5 (03.16.23), #6 (04.12.23),   **IVA resistance OU** - s/p IVE OD #1 (05.12.23) -- sample, #2 (06.09.23), #3 (07.07.23), #4 (08.04.23), #5 (09.22.23), #6 (11.07.23), #7 (11.07.23), #8 (01.30.24), #9 (03.12.24), #10 (05.01.24) - s/p IVE OS #1 (07.07.23), #2 (08.04.23),  #3 (09.22.23), #4 (11.07.23),#5 (11.07.23), #6 (01.30.24), #  7 (03.12.24), #8 (05.01.24) - exam shows scattered MA/DBH OU, +edema and exudates - FA (11.14.22) shows vascular perfusion defects OU, no NV OU, OS with focal macroaneurysm along IT arcades - OCT shows OD: Trace IRF / IRHM temporal macula and fovea -- improved -- just trace cystic changes remain, OS: Stable improvement IRF/IRHM centrally, trace persistent cystic changes sup mac -- slightly improved at 7 weeks  - BCVA 20/30 OU -- stable - recommend IVE OU today (OD #11, OS #9) 05.01.24 with f/u extended to 8 weeks - pt wishes to proceed with injections - RBA of procedure discussed, questions answered - IVA informed consent obtained and signed OU, 11.14.22 - IVE informed consent obtained and signed OU, 05.13.23 - see procedure note - f/u in 8 weeks, DFE, OCT, possible injection(s)  2. Retinal macroaneurysm OS  - focal lesion along IT arcades -- regressed  - s/p IVE OU as above  3,4. Hypertensive retinopathy OU - discussed importance of tight BP control - continue to monitor  5.  Mixed Cataract OU  - being monitored by Dr. Emily Filbert - The symptoms of cataract, surgical options, and treatments and risks were discussed with patient.  - discussed diagnosis and progression - approaching visual significance   Ophthalmic Meds Ordered this visit:  Meds ordered this encounter  Medications   aflibercept (EYLEA) SOLN 2 mg   aflibercept (EYLEA) SOLN 2 mg     Return in about 8 weeks (around 03/10/2023) for f/u NPDR OU, DFE, OCT.  There are no Patient Instructions on file for this visit.   Explained the diagnoses, plan, and follow up with the patient and they expressed understanding.  Patient expressed understanding of the importance of proper follow up care.   This document serves as a record of services personally performed by Karie Chimera, MD, PhD. It was created on their behalf by De Blanch, an ophthalmic technician. The  creation of this record is the provider's dictation and/or activities during the visit.    Electronically signed by: De Blanch, OA, 01/13/23  5:06 PM  This document serves as a record of services personally performed by Karie Chimera, MD, PhD. It was created on their behalf by Glee Arvin. Manson Passey, OA an ophthalmic technician. The creation of this record is the provider's dictation and/or activities during the visit.    Electronically signed by: Glee Arvin. Manson Passey, [place] 06.19.2024 5:06 PM   Karie Chimera, M.D., Ph.D. Diseases & Surgery of the Retina and Vitreous [facility]  I have reviewed the above documentation for accuracy and completeness, and I agree with the above. Karie Chimera, M.D., Ph.D. 01/13/23 5:07 PM   Abbreviations: M myopia (nearsighted); A astigmatism; H hyperopia (farsighted); P presbyopia; Mrx spectacle prescription;  CTL contact lenses; OD right eye; OS left eye; OU both eyes  XT exotropia; ET esotropia; PEK punctate epithelial keratitis; PEE punctate epithelial erosions; DES dry eye syndrome; MGD meibomian gland dysfunction; ATs artificial tears; PFAT's preservative free artificial tears; NSC nuclear sclerotic cataract; PSC posterior subcapsular cataract; ERM epi-retinal membrane; PVD posterior vitreous detachment; RD retinal detachment; DM diabetes mellitus; DR diabetic retinopathy; NPDR non-proliferative diabetic retinopathy; PDR proliferative diabetic retinopathy; CSME clinically significant macular edema; DME diabetic macular edema; dbh dot blot hemorrhages; CWS cotton wool spot; POAG primary open angle glaucoma; C/D cup-to-disc ratio; HVF humphrey visual field; GVF goldmann visual field; OCT optical coherence tomography; IOP intraocular pressure; BRVO Branch retinal vein occlusion; CRVO central retinal vein occlusion; CRAO central retinal  artery occlusion; BRAO branch retinal artery occlusion; RT retinal tear; SB scleral buckle; PPV pars plana  vitrectomy; VH Vitreous hemorrhage; PRP panretinal laser photocoagulation; IVK intravitreal kenalog; VMT vitreomacular traction; MH Macular hole;  NVD neovascularization of the disc; NVE neovascularization elsewhere; AREDS age related eye disease study; ARMD age related macular degeneration; POAG primary open angle glaucoma; EBMD epithelial/anterior basement membrane dystrophy; ACIOL anterior chamber intraocular lens; IOL intraocular lens; PCIOL posterior chamber intraocular lens; Phaco/IOL phacoemulsification with intraocular lens placement; PRK photorefractive keratectomy; LASIK laser assisted in situ keratomileusis; HTN hypertension; DM diabetes mellitus; COPD chronic obstructive pulmonary disease

## 2023-01-07 NOTE — Progress Notes (Signed)
Patient ID: Chad [name] MRN: [id number] DOB/AGE: [birth year] 77 y.o.  Primary Care Physician:Polite, Windy Fast, MD Primary Cardiologist: Charlton Haws, MD  FOCUSED CARDIOVASCULAR PROBLEM LIST:   1.  Severe aortic stenosis with an aortic valve area 0.92 cm2, mean gradient 37 mmHg, and peak velocity of 3.97 m/s with normal ejection fraction; EKG demonstrates sinus bradycardia without bundle-branch blocks 2.  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  Hyperlipidemia 4.  Hypertension 5.  CKD stage IV  HISTORY OF PRESENT ILLNESS: March 2023: The patient is a 77 y.o. male with the indicated medical history here for recommendations regarding progression of aortic stenosis.  The patient is here with his wife.  The patient denies any dyspnea, chest pain, presyncope, or syncope.  He has been using a reclining bicycle for some time.  He is able to do equivalent of 2000-2100 steps over a 30-minute period.  This has not changed over the last several years.  He fortunately has not required any emergency room visits or hospitalizations for cardiac issues including dyspnea, presyncope, or syncope.  He and his wife are avid travelers.  They have a son in [place] and another 1 in [place] whom they visit frequently.  He is scheduled to go on a cruise in [place] this July.  He went on a cruise last year.  The like to opt for low intensity activities on cruises.  He feels that if he exerted himself to a high degree he would likely be short of breath.  He sees a Education officer, community on a regular basis and reports very good dental health.  Plan:  Abdominal MRI, check BNP, start amlodipine.  Today: In the interim the patient had an abdominal MRI which demonstrated reassuring findings.  His BNP was found to be elevated over 500.  He advises on admission continues to do well.  He has been modulating his activity level so as to not exert symptoms of shortness of breath.  For example he will not go upstairs and take an elevator  if needed.  He still active and uses a recumbent bike routinely.  He did not denies any exertional chest pain, presyncope, or syncope.  He has had no peripheral edema, paroxysmal atrial dyspnea, orthopnea.  He has required no emergency room visits or hospitalizations.  He did see his nephrologist recently and his kidney dysfunction is stable with no impending need for renal replacement therapy.  Past Medical History:  Diagnosis Date   Cataract    CKD (chronic kidney disease)    Colon polyps    Diabetes (HCC)    Diabetic polyneuropathy associated with type 2 diabetes mellitus (HCC) 01/15/2017   Diabetic retinopathy (HCC)    GERD (gastroesophageal reflux disease)    Hypercholesterolemia    Hypertension    Hypertensive retinopathy    Hypothyroidism    Polyneuropathy    Proteinuria     History reviewed. No pertinent surgical history.  Family History  Problem Relation Age of Onset   Stroke Father    Glaucoma Father     Social History   Socioeconomic History   Marital status: Married    Spouse name: Not on file   Number of children: 2   Years of education: college   Highest education level: Not on file  Occupational History   Occupation: Health visitor  Tobacco Use   Smoking status: Former   Smokeless tobacco: Never  Building services engineer Use: Never used  Substance and Sexual Activity  Alcohol use: Yes   Drug use: No   Sexual activity: Not Currently  Other Topics Concern   Not on file  Social History Narrative   Patient lives with wife in a 2 story home.  Has 2 children.  Works as a Health visitor.  Education: college.       Right handed   Social Determinants of Health   Financial Resource Strain: Not on file  Food Insecurity: Not on file  Transportation Needs: Not on file  Physical Activity: Not on file  Stress: Not on file  Social Connections: Not on file  Intimate Partner Violence: Not on file     Prior to Admission medications    Medication Sig Start Date End Date Taking? Authorizing Provider  B-D ULTRAFINE III SHORT PEN 31G X 8 MM MISC SMARTSIG:1 Each SUB-Q Daily 04/25/21   [provider]  Calcium Carb-Cholecalciferol (CALCIUM 500 + D) 500-125 MG-UNIT TABS Take 1 tablet by mouth daily.    [provider]  gabapentin (NEURONTIN) 300 MG capsule Take 2 tablet twice daily 08/24/22   Nita Sickle K, DO  insulin glargine (LANTUS) 100 UNIT/ML injection Inject 10 Units into the skin daily.    [provider]  JARDIANCE 10 MG TABS tablet Take 10 mg by mouth daily. 11/11/20   [provider]  levothyroxine (SYNTHROID, LEVOTHROID) 75 MCG tablet Take 75 mcg by mouth daily before breakfast.  12/28/16   [provider]  losartan-hydrochlorothiazide (HYZAAR) 100-25 MG tablet Take 1 tablet by mouth daily. 12/16/21   [provider]  metoprolol succinate (TOPROL-XL) 25 MG 24 hr tablet TAKE ONE TABLET BY MOUTH DAILY 03/24/22   Wendall Stade, MD  nortriptyline (PAMELOR) 10 MG capsule TAKE THREE CAPSULES BY MOUTH EVERY NIGHT AT BEDTIME 04/20/22   Glendale Chard, DO  ONETOUCH VERIO test strip  04/24/21   [provider]  rosuvastatin (CRESTOR) 40 MG tablet Take 1 tablet by mouth daily.    [provider]    No Known Allergies  REVIEW OF SYSTEMS:  General: no fevers/chills/night sweats Eyes: no blurry vision, diplopia, or amaurosis ENT: no sore throat or hearing loss Resp: no cough, wheezing, or hemoptysis CV: no edema or palpitations GI: no abdominal pain, nausea, vomiting, diarrhea, or constipation GU: no dysuria, frequency, or hematuria Skin: no rash Neuro: no headache, numbness, tingling, or weakness of extremities Musculoskeletal: no joint pain or swelling Heme: no bleeding, DVT, or easy bruising Endo: no polydipsia or polyuria  BP 126/70   Pulse 74   Ht 5\' 8"  (1.727 m)   Wt 183 lb 6.4 oz (83.2 kg)   SpO2 96%   BMI 27.89 kg/m   PHYSICAL EXAM: GEN:   AO x 3 in no acute distress HEENT: normal Dentition: Normal Neck: JVP normal. +2 carotid upstrokes without bruits. No thyromegaly. Lungs: equal expansion, clear bilaterally CV: Apex is discrete and nondisplaced, RRR with 3/6 SEM Abd: soft, non-tender, non-distended; no bruit; positive bowel sounds Ext: no edema, ecchymoses, or cyanosis Vascular: 2+ femoral pulses, 2+ radial pulses       Skin: warm and dry without rash Neuro: CN II-XII grossly intact; motor and sensory grossly intact    DATA AND STUDIES:  EKG: Sinus bradycardia without bundle-branch blocks  2D ECHO: Echocardiogram done today has not yet been read but on my review demonstrates severe aortic stenosis with an aortic valve area of 0.92, mean gradient of 37 mmHg, and a peak velocity of 3.97 m/s with preserved  ejection fraction  January 2024 1. Left ventricular ejection fraction, by estimation, is 70 to 75%. The  left ventricle has hyperdynamic function. The left ventricle has no  regional wall motion abnormalities. Left ventricular diastolic parameters  are consistent with Grade I diastolic  dysfunction (impaired relaxation).   2. Right ventricular systolic function is normal. The right ventricular  size is normal.   3. Left atrial size was moderately dilated.   4. The mitral valve is normal in structure. Mild mitral valve  regurgitation. No evidence of mitral stenosis.   5. The aortic valve is calcified. Aortic valve regurgitation is not  visualized. Severe aortic valve stenosis. Aortic valve area, by VTI  measures 0.84 cm. Aortic valve mean gradient measures 44.0 mmHg. Aortic  valve Vmax measures 4.39 m/s.   6. The inferior vena cava is normal in size with greater than 50%  respiratory variability, suggesting right atrial pressure of 3 mmHg.   CTA: 2024 1.  Calcified tri leaflet AV with score 3449 2. Annular area of 539 mm2 upper value for 26 mm Sapien 3 valve Sinuses small for a 34 mm Evolut 3.  Coronary  arteries sufficient height above annulus for deployment 4.  Membranous septal length 8.25 mm 5.  Angiographic Angle for deployment LAO 14 Caudal 14 degrees 6.  Indeterminant lesions of the liver and right kidney. 7.  5 mm groundglass nodule in the left upper lobe of lung  CARDIAC CATH: n/a  STS RISK CALCULATOR:   Procedure Type: Isolated AVR PERIOPERATIVE OUTCOME ESTIMATE % Operative Mortality 2.08% Morbidity & Mortality 12.3% Stroke 1.79% Renal Failure 4.78% Reoperation 4.59% Prolonged Ventilation 4.14% Deep Sternal Wound Infection 0.066% Long Hospital Stay (>14 days) 6.16% Short Hospital Stay (<6 days)* 43.8%  NHYA CLASS: 2  ASSESSMENT AND PLAN:   Severe aortic stenosis - Plan: ECHOCARDIOGRAM COMPLETE  Type 2 diabetes mellitus with complication, with long-term current use of insulin (HCC)  Diabetic polyneuropathy associated with type 2 diabetes mellitus (HCC)  CKD (chronic kidney disease) stage 4, GFR 15-29 ml/min (HCC)  Hypertension associated with diabetes (HCC)  Hyperlipidemia associated with type 2 diabetes mellitus (HCC)  The patient continues to do well and I suspect he has developed NYHA class II symptoms.  He is modulating his activity level.  Fortunately his ejection fraction remains preserved.  I did tell him that we can continue to monitor him for the development of worsening symptomatology.  Have asked him to continue to be active and his same activity level.  He knows to contact our office if he develops any presyncope, syncope, worsening exertional dyspnea, or chest pain.  We will have to be careful regarding TAVR in this patient with stage V chronic kidney disease.  I suspect that he is aortic stenosis will need to be addressed prior to him requiring renal replacement therapy.  I will see him back in 4 months or earlier if needed.  Orbie Pyo, MD  01/11/2023 10:13 AM    [facility] [address] [place], [place], [place]  [address] Phone:  [phone]; Fax: [phone]

## 2023-01-11 ENCOUNTER — Encounter: Payer: Self-pay | Admitting: Internal Medicine

## 2023-01-11 ENCOUNTER — Ambulatory Visit (HOSPITAL_BASED_OUTPATIENT_CLINIC_OR_DEPARTMENT_OTHER): Payer: PPO

## 2023-01-11 ENCOUNTER — Ambulatory Visit: Payer: PPO | Attending: Internal Medicine | Admitting: Internal Medicine

## 2023-01-11 VITALS — BP 126/70 | HR 74 | Ht 68.0 in | Wt 183.4 lb

## 2023-01-11 DIAGNOSIS — E1159 Type 2 diabetes mellitus with other circulatory complications: Secondary | ICD-10-CM | POA: Diagnosis not present

## 2023-01-11 DIAGNOSIS — Z794 Long term (current) use of insulin: Secondary | ICD-10-CM

## 2023-01-11 DIAGNOSIS — E1169 Type 2 diabetes mellitus with other specified complication: Secondary | ICD-10-CM

## 2023-01-11 DIAGNOSIS — E785 Hyperlipidemia, unspecified: Secondary | ICD-10-CM | POA: Insufficient documentation

## 2023-01-11 DIAGNOSIS — E1142 Type 2 diabetes mellitus with diabetic polyneuropathy: Secondary | ICD-10-CM | POA: Diagnosis not present

## 2023-01-11 DIAGNOSIS — I350 Nonrheumatic aortic (valve) stenosis: Secondary | ICD-10-CM

## 2023-01-11 DIAGNOSIS — N184 Chronic kidney disease, stage 4 (severe): Secondary | ICD-10-CM | POA: Diagnosis not present

## 2023-01-11 DIAGNOSIS — I152 Hypertension secondary to endocrine disorders: Secondary | ICD-10-CM | POA: Diagnosis not present

## 2023-01-11 DIAGNOSIS — E118 Type 2 diabetes mellitus with unspecified complications: Secondary | ICD-10-CM | POA: Insufficient documentation

## 2023-01-11 LAB — ECHOCARDIOGRAM COMPLETE
AR max vel: 0.78 cm2
AV Area VTI: 0.92 cm2
AV Area mean vel: 0.82 cm2
AV Mean grad: 38.5 mmHg
AV Peak grad: 65.3 mmHg
Ao pk vel: 4.04 m/s
Area-P 1/2: 2.11 cm2
S' Lateral: 2.5 cm

## 2023-01-11 NOTE — Patient Instructions (Addendum)
Medication Instructions:  Your physician recommends that you continue on your current medications as directed. Please refer to the Current Medication list given to you today.  *If you need a refill on your cardiac medications before your next appointment, please call your pharmacy*   Testing/Procedures: Your physician has requested that you have an echocardiogram due in October 2024. Echocardiography is a painless test that uses sound waves to create images of your heart. It provides your doctor with information about the size and shape of your heart and how well your heart's chambers and valves are working. This procedure takes approximately one hour. There are no restrictions for this procedure. Please do NOT wear cologne, perfume, aftershave, or lotions (deodorant is allowed). Please arrive 15 minutes prior to your appointment time.    Follow-Up: At [facility], you and your health needs are our priority.  As part of our continuing mission to provide you with exceptional heart care, we have created designated Provider Care Teams.  These Care Teams include your primary Cardiologist (physician) and Advanced Practice Providers (APPs -  Physician Assistants and Nurse Practitioners) who all work together to provide you with the care you need, when you need it.   Your next appointment:  SEE BELOW

## 2023-01-13 ENCOUNTER — Ambulatory Visit (INDEPENDENT_AMBULATORY_CARE_PROVIDER_SITE_OTHER): Payer: PPO | Admitting: Ophthalmology

## 2023-01-13 ENCOUNTER — Encounter (INDEPENDENT_AMBULATORY_CARE_PROVIDER_SITE_OTHER): Payer: Self-pay | Admitting: Ophthalmology

## 2023-01-13 DIAGNOSIS — E113313 Type 2 diabetes mellitus with moderate nonproliferative diabetic retinopathy with macular edema, bilateral: Secondary | ICD-10-CM | POA: Diagnosis not present

## 2023-01-13 DIAGNOSIS — H25813 Combined forms of age-related cataract, bilateral: Secondary | ICD-10-CM

## 2023-01-13 DIAGNOSIS — H35033 Hypertensive retinopathy, bilateral: Secondary | ICD-10-CM

## 2023-01-13 DIAGNOSIS — I10 Essential (primary) hypertension: Secondary | ICD-10-CM | POA: Diagnosis not present

## 2023-01-13 DIAGNOSIS — H35012 Changes in retinal vascular appearance, left eye: Secondary | ICD-10-CM | POA: Diagnosis not present

## 2023-01-13 MED ORDER — AFLIBERCEPT 2MG/0.05ML IZ SOLN FOR KALEIDOSCOPE
2.0000 mg | INTRAVITREAL | Status: AC | PRN
  Administered 2023-01-13: 2 mg via INTRAVITREAL

## 2023-01-14 DIAGNOSIS — L814 Other melanin hyperpigmentation: Secondary | ICD-10-CM | POA: Diagnosis not present

## 2023-01-14 DIAGNOSIS — D492 Neoplasm of unspecified behavior of bone, soft tissue, and skin: Secondary | ICD-10-CM | POA: Diagnosis not present

## 2023-01-14 DIAGNOSIS — L570 Actinic keratosis: Secondary | ICD-10-CM | POA: Diagnosis not present

## 2023-02-18 DIAGNOSIS — N1832 Chronic kidney disease, stage 3b: Secondary | ICD-10-CM | POA: Diagnosis not present

## 2023-02-18 DIAGNOSIS — Z794 Long term (current) use of insulin: Secondary | ICD-10-CM | POA: Diagnosis not present

## 2023-02-18 DIAGNOSIS — E1142 Type 2 diabetes mellitus with diabetic polyneuropathy: Secondary | ICD-10-CM | POA: Diagnosis not present

## 2023-02-18 DIAGNOSIS — R809 Proteinuria, unspecified: Secondary | ICD-10-CM | POA: Diagnosis not present

## 2023-02-18 DIAGNOSIS — E1122 Type 2 diabetes mellitus with diabetic chronic kidney disease: Secondary | ICD-10-CM | POA: Diagnosis not present

## 2023-02-18 DIAGNOSIS — E113313 Type 2 diabetes mellitus with moderate nonproliferative diabetic retinopathy with macular edema, bilateral: Secondary | ICD-10-CM | POA: Diagnosis not present

## 2023-02-18 DIAGNOSIS — Z23 Encounter for immunization: Secondary | ICD-10-CM | POA: Diagnosis not present

## 2023-02-18 DIAGNOSIS — Z0000 Encounter for general adult medical examination without abnormal findings: Secondary | ICD-10-CM | POA: Diagnosis not present

## 2023-02-18 DIAGNOSIS — Q253 Supravalvular aortic stenosis: Secondary | ICD-10-CM | POA: Diagnosis not present

## 2023-02-18 DIAGNOSIS — I358 Other nonrheumatic aortic valve disorders: Secondary | ICD-10-CM | POA: Diagnosis not present

## 2023-02-18 DIAGNOSIS — E7800 Pure hypercholesterolemia, unspecified: Secondary | ICD-10-CM | POA: Diagnosis not present

## 2023-03-10 ENCOUNTER — Encounter (INDEPENDENT_AMBULATORY_CARE_PROVIDER_SITE_OTHER): Payer: PPO | Admitting: Ophthalmology

## 2023-03-18 NOTE — Progress Notes (Signed)
[facility] - Clinic Note  03/19/2023    CHIEF COMPLAINT Patient presents for Retina Follow Up  HISTORY OF PRESENT ILLNESS: Chad [name] is a 77 y.o. male who presents to the clinic today for:   HPI     Retina Follow Up   Patient presents with  Diabetic Retinopathy.  In both eyes.  This started months ago.  Duration of 8 weeks.  I, the attending physician,  performed the HPI with the patient and updated documentation appropriately.        Comments   Patient states that the vision is doing the same. He has not been using eye drops. His blood sugar was 162.      Last edited by Rennis Chris, MD on 03/19/2023  3:19 PM.     Referring physician: Renford Dills, MD [address] [place],  [place] [address]  HISTORICAL INFORMATION:   Selected notes from the MEDICAL RECORD NUMBER Referred by Dr. Emily Filbert for diabetic retinopathy with DME OU [name]:  Ocular Hx- PMH-    CURRENT MEDICATIONS: No current outpatient medications on file. (Ophthalmic Drugs)   No current facility-administered medications for this visit. (Ophthalmic Drugs)   Current Outpatient Medications (Other)  Medication Sig   amLODipine (NORVASC) 5 MG tablet Take 1 tablet (5 mg total) by mouth daily.   Ascorbic Acid (VITAMIN C) 500 MG CAPS 1 tablet Orally Once a day   B-D ULTRAFINE III SHORT PEN 31G X 8 MM MISC SMARTSIG:1 Each SUB-Q Daily   Calcium Carb-Cholecalciferol (CALCIUM 500 + D) 500-125 MG-UNIT TABS Take 1 tablet by mouth daily.   Cyanocobalamin 5000 MCG TBDP 1 tablet Sublingual Once a day   gabapentin (NEURONTIN) 300 MG capsule Take 2 tablet twice daily   insulin glargine (LANTUS) 100 UNIT/ML injection Inject 10 Units into the skin daily.   JARDIANCE 10 MG TABS tablet Take 10 mg by mouth daily.   levothyroxine (SYNTHROID, LEVOTHROID) 75 MCG tablet Take 75 mcg by mouth daily before breakfast.    losartan-hydrochlorothiazide (HYZAAR) 100-25 MG tablet Take 1 tablet by mouth  daily.   metoprolol succinate (TOPROL-XL) 25 MG 24 hr tablet TAKE ONE TABLET BY MOUTH DAILY   nortriptyline (PAMELOR) 10 MG capsule TAKE THREE CAPSULES BY MOUTH EVERY NIGHT AT BEDTIME   ONETOUCH VERIO test strip    rosuvastatin (CRESTOR) 40 MG tablet Take 1 tablet by mouth daily.   No current facility-administered medications for this visit. (Other)   REVIEW OF SYSTEMS: ROS   Positive for: Endocrine, Cardiovascular, Eyes Negative for: Constitutional, Gastrointestinal, Neurological, Skin, Genitourinary, Musculoskeletal, HENT, Respiratory, Psychiatric, Allergic/Imm, Heme/Lymph Last edited by Charlette Caffey, COT on 03/19/2023  1:31 PM.      ALLERGIES No Known Allergies  PAST MEDICAL HISTORY Past Medical History:  Diagnosis Date   Cataract    CKD (chronic kidney disease)    Colon polyps    Diabetes (HCC)    Diabetic polyneuropathy associated with type 2 diabetes mellitus (HCC) 01/15/2017   Diabetic retinopathy (HCC)    GERD (gastroesophageal reflux disease)    Hypercholesterolemia    Hypertension    Hypertensive retinopathy    Hypothyroidism    Polyneuropathy    Proteinuria    History reviewed. No pertinent surgical history.  FAMILY HISTORY Family History  Problem Relation Age of Onset   Stroke Father    Glaucoma Father    SOCIAL HISTORY Social History   Tobacco Use   Smoking status: Former   Smokeless  tobacco: Never  Vaping Use   Vaping status: Never Used  Substance Use Topics   Alcohol use: Yes   Drug use: No       OPHTHALMIC EXAM: Base Eye Exam     Visual Acuity (Snellen - Linear)       Right Left   Dist [place] 20/30 20/40   Dist ph [place] NI 20/30 +2         Tonometry (Tonopen, 1:36 PM)       Right Left   Pressure 14 12         Pupils       Dark Light Shape React APD   Right 3 2 Round Brisk None   Left 3 2 Round Brisk None         Visual Fields       Left Right    Full Full         Extraocular Movement       Right Left     Full, Ortho Full, Ortho         Neuro/Psych     Oriented x3: Yes   Mood/Affect: Normal         Dilation     Both eyes: 1.0% Mydriacyl, 2.5% Phenylephrine @ 1:32 PM           Slit Lamp and Fundus Exam     External Exam       Right Left   External Normal Normal         Slit Lamp Exam       Right Left   Lids/Lashes Dermatochalasis - upper lid, mild Telangiectasia Dermatochalasis - upper lid, Meibomian gland dysfunction   Conjunctiva/Sclera White and quiet White and quiet   Cornea 2+ fine, inferior Punctate epithelial erosions 1+ Punctate epithelial erosions, mild tear film debris   Anterior Chamber deep, clear, narrow angles deep, clear, narrow angles   Iris Round and dilated, mild anterior bowing, No NVI Round and dilated, mild anterior bowing, No NVI   Lens 2-3+ Nuclear sclerosis, 2+ Cortical cataract 2-3+ Nuclear sclerosis, 2+ Cortical cataract   Anterior Vitreous Vitreous syneresis Vitreous syneresis         Fundus Exam       Right Left   Disc Pink and Sharp, no NVD Pink and Sharp, no NVD   C/D Ratio 0.3 0.3   Macula good foveal reflex, mild ERM, minimal MA and exudates, central cystic changes -- stably improved Flat, good foveal reflex, minimal MA and focal exudates, trace residual cystic changes superior mac -- improved   Vessels attenuated, Tortuous attenuated, Tortuous, Copper wiring, Macroaneurysm / CWS along IT arcades - improving   Periphery Attached, scattered MA and CWS greatest posteriorly Attached, scattered MA/DBH greatest posteriorly, focal CWS inferior to IT arcades--fading           IMAGING AND PROCEDURES  Imaging and Procedures for 03/19/2023  OCT, Retina - OU - Both Eyes       Right Eye Quality was good. Central Foveal Thickness: 298. Progression has improved. Findings include no IRF, no SRF, abnormal foveal contour, intraretinal hyper-reflective material, epiretinal membrane, macular pucker (Trace IRF / IRHM temporal macula and  fovea -- improved -- just trace cystic changes remain).   Left Eye Quality was good. Central Foveal Thickness: 269. Progression has improved. Findings include normal foveal contour, no SRF, intraretinal hyper-reflective material, intraretinal fluid (Stable improvement IRF/IRHM centrally, trace persistent cystic changes sup mac -- slightly improved).   Notes *Images captured  and stored on drive  Diagnosis / Impression:  +DME OU OD: Trace IRF / IRHM temporal macula and fovea -- improved -- just trace cystic changes remain OS: Stable improvement IRF/IRHM centrally, trace persistent cystic changes sup mac -- slightly improved  Clinical management:  See below  Abbreviations: NFP - Normal foveal profile. CME - cystoid macular edema. PED - pigment epithelial detachment. IRF - intraretinal fluid. SRF - subretinal fluid. EZ - ellipsoid zone. ERM - epiretinal membrane. ORA - outer retinal atrophy. ORT - outer retinal tubulation. SRHM - subretinal hyper-reflective material. IRHM - intraretinal hyper-reflective material      Intravitreal Injection, Pharmacologic Agent - OD - Right Eye       Time Out 03/19/2023. 1:50 PM. Confirmed correct patient, procedure, site, and patient consented.   Anesthesia Topical anesthesia was used. Anesthetic medications included Lidocaine 2%, Proparacaine 0.5%.   Procedure Preparation included 5% betadine to ocular surface, eyelid speculum. A (32g) needle was used.   Injection: 2 mg aflibercept 2 MG/0.05ML   Route: Intravitreal, Site: Right Eye   NDC: L6038910, Lot: [id number], Expiration date: 04/25/2024, Waste: 0 mL   Post-op Post injection exam found visual acuity of at least counting fingers. The patient tolerated the procedure well. There were no complications. The patient received written and verbal post procedure care education. Post injection medications were not given.      Intravitreal Injection, Pharmacologic Agent - OS - Left Eye        Time Out 03/19/2023. 1:51 PM. Confirmed correct patient, procedure, site, and patient consented.   Anesthesia Topical anesthesia was used. Anesthetic medications included Lidocaine 2%, Proparacaine 0.5%.   Procedure Preparation included 5% betadine to ocular surface, eyelid speculum. A (32g) needle was used.   Injection: 2 mg aflibercept 2 MG/0.05ML   Route: Intravitreal, Site: Left Eye   NDC: L6038910, Lot: [id number], Expiration date: 03/26/2024, Waste: 0 mL   Post-op Post injection exam found visual acuity of at least counting fingers. The patient tolerated the procedure well. There were no complications. The patient received written and verbal post procedure care education. Post injection medications were not given.             ASSESSMENT/PLAN:    ICD-10-CM   1. 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 Essential hypertension  I10     4. Hypertensive retinopathy of both eyes  H35.033     5. Combined forms of age-related cataract of both eyes  H25.813      1. Moderate Non-proliferative diabetic retinopathy w/ DME OU  - s/p IVA OD #1 (11.17.22) - s/p IVA OS #1 (11.14.22), #7 (05.12.23), #8 (06.09.23) - s/p IVA OU #2 (12.15.22), #3 (01.12.23), #4 (02.13.23), #5 (03.16.23), #6 (04.12.23),   **IVA resistance OU** - s/p IVE OD #1 (05.12.23) -- sample, #2 (06.09.23), #3 (07.07.23), #4 (08.04.23), #5 (09.22.23), #6 (11.07.23), #7 (11.07.23), #8 (01.30.24), #9 (03.12.24), #10 (05.01.24), #11 (06.19.24) - s/p IVE OS #1 (07.07.23), #2 (08.04.23), #3 (09.22.23), #4 (11.07.23),#5 (11.07.23), #6 (01.30.24), #7  (03.12.24), #8 (05.01.24), #9 (06.19.24) - exam shows scattered  MA/DBH OU, +edema and exudates - FA (11.14.22) shows vascular perfusion defects OU, no NV OU, OS with focal macroaneurysm along IT arcades - OCT shows OD: Trace IRF / IRHM temporal macula and fovea -- improved -- just trace cystic changes remain, OS: Stable improvement IRF/IRHM centrally, trace persistent cystic changes sup mac -- slightly improved at 7 weeks  - BCVA 20/30 OU -- stable - recommend IVE OU today (OD #12, OS #10) 08.23.24 with f/u extended to 12 weeks - pt wishes to proceed with injections - RBA of procedure discussed, questions answered - IVE informed consent obtained and signed OU, 05.13.23 - see procedure note - f/u in 12 weeks, DFE, OCT, possible injection(s)  2. Retinal macroaneurysm OS  - focal lesion along IT arcades -- regressed  - s/p IVE OU as above  3,4. Hypertensive retinopathy OU - discussed importance of tight BP control - continue to monitor  5.  Mixed Cataract OU  - being monitored by Dr. Emily Filbert - The symptoms of cataract, surgical options, and treatments and risks were discussed with patient.  - discussed diagnosis and progression - approaching visual significance   Ophthalmic Meds Ordered this visit:  Meds ordered this encounter  Medications   aflibercept (EYLEA) SOLN 2 mg   aflibercept (EYLEA) SOLN 2 mg     Return in about 12 weeks (around 06/11/2023) for f/u NPDR OU, DFE, OCT, Possible Injxn.  There are no Patient Instructions on file for this visit.   Explained the diagnoses, plan, and follow up with the patient and they expressed understanding.  Patient expressed understanding of the importance of proper follow up care.   This document serves as a record of services personally performed by Karie Chimera, MD, PhD. It was created on their behalf by Glee Arvin. Manson Passey, OA an ophthalmic technician. The creation of this record is the provider's dictation and/or activities during the  visit.    Electronically signed by: Glee Arvin. Manson Passey, OA 03/19/23 3:23 PM  Karie Chimera, M.D., Ph.D. Diseases & Surgery of the Retina and Vitreous [facility]  I have reviewed the above documentation for accuracy and completeness, and I agree with the above. Karie Chimera, M.D., Ph.D. 03/19/23 3:23 PM  Abbreviations: M myopia (nearsighted); A astigmatism; H hyperopia (farsighted); P presbyopia; Mrx spectacle prescription;  CTL contact lenses; OD right eye; OS left eye; OU both eyes  XT exotropia; ET esotropia; PEK punctate epithelial keratitis; PEE punctate epithelial erosions; DES dry eye syndrome; MGD meibomian gland dysfunction; ATs artificial tears; PFAT's preservative free artificial tears; NSC nuclear sclerotic cataract; PSC posterior subcapsular cataract; ERM epi-retinal membrane; PVD posterior vitreous detachment; RD retinal detachment; DM diabetes mellitus; DR diabetic retinopathy; NPDR non-proliferative diabetic retinopathy; PDR proliferative diabetic retinopathy; CSME clinically significant macular edema; DME diabetic macular edema; dbh dot blot hemorrhages; CWS cotton wool spot; POAG primary open angle glaucoma; C/D cup-to-disc ratio; HVF humphrey visual field; GVF goldmann visual field; OCT optical coherence tomography; IOP intraocular pressure; BRVO Branch retinal vein occlusion; CRVO central retinal vein occlusion; CRAO central retinal artery occlusion; BRAO branch retinal artery occlusion; RT retinal tear; SB scleral buckle; PPV pars plana vitrectomy; VH Vitreous hemorrhage; PRP panretinal laser photocoagulation; IVK intravitreal kenalog; VMT vitreomacular traction; MH Macular hole;  NVD neovascularization of the disc; NVE neovascularization elsewhere; AREDS age related eye disease study; ARMD age related macular degeneration; POAG primary open angle glaucoma; EBMD epithelial/anterior basement membrane dystrophy; ACIOL anterior chamber intraocular lens; IOL  intraocular lens; PCIOL  posterior chamber intraocular lens; Phaco/IOL phacoemulsification with intraocular lens placement; PRK photorefractive keratectomy; LASIK laser assisted in situ keratomileusis; HTN hypertension; DM diabetes mellitus; COPD chronic obstructive pulmonary disease

## 2023-03-19 ENCOUNTER — Encounter (INDEPENDENT_AMBULATORY_CARE_PROVIDER_SITE_OTHER): Payer: Self-pay | Admitting: Ophthalmology

## 2023-03-19 ENCOUNTER — Ambulatory Visit (INDEPENDENT_AMBULATORY_CARE_PROVIDER_SITE_OTHER): Payer: PPO | Admitting: Ophthalmology

## 2023-03-19 DIAGNOSIS — E113313 Type 2 diabetes mellitus with moderate nonproliferative diabetic retinopathy with macular edema, bilateral: Secondary | ICD-10-CM | POA: Diagnosis not present

## 2023-03-19 DIAGNOSIS — H35012 Changes in retinal vascular appearance, left eye: Secondary | ICD-10-CM

## 2023-03-19 DIAGNOSIS — H25813 Combined forms of age-related cataract, bilateral: Secondary | ICD-10-CM

## 2023-03-19 DIAGNOSIS — I10 Essential (primary) hypertension: Secondary | ICD-10-CM | POA: Diagnosis not present

## 2023-03-19 DIAGNOSIS — H35033 Hypertensive retinopathy, bilateral: Secondary | ICD-10-CM

## 2023-03-19 MED ORDER — AFLIBERCEPT 2MG/0.05ML IZ SOLN FOR KALEIDOSCOPE
2.0000 mg | INTRAVITREAL | Status: AC | PRN
  Administered 2023-03-19: 2 mg via INTRAVITREAL

## 2023-03-21 ENCOUNTER — Other Ambulatory Visit: Payer: Self-pay | Admitting: Cardiovascular Disease

## 2023-04-07 DIAGNOSIS — Z23 Encounter for immunization: Secondary | ICD-10-CM | POA: Diagnosis not present

## 2023-04-09 DIAGNOSIS — N184 Chronic kidney disease, stage 4 (severe): Secondary | ICD-10-CM | POA: Diagnosis not present

## 2023-04-14 DIAGNOSIS — N184 Chronic kidney disease, stage 4 (severe): Secondary | ICD-10-CM | POA: Diagnosis not present

## 2023-04-14 DIAGNOSIS — I350 Nonrheumatic aortic (valve) stenosis: Secondary | ICD-10-CM | POA: Diagnosis not present

## 2023-04-14 DIAGNOSIS — I129 Hypertensive chronic kidney disease with stage 1 through stage 4 chronic kidney disease, or unspecified chronic kidney disease: Secondary | ICD-10-CM | POA: Diagnosis not present

## 2023-04-14 DIAGNOSIS — E1122 Type 2 diabetes mellitus with diabetic chronic kidney disease: Secondary | ICD-10-CM | POA: Diagnosis not present

## 2023-04-14 DIAGNOSIS — E785 Hyperlipidemia, unspecified: Secondary | ICD-10-CM | POA: Diagnosis not present

## 2023-04-14 DIAGNOSIS — E039 Hypothyroidism, unspecified: Secondary | ICD-10-CM | POA: Diagnosis not present

## 2023-04-18 ENCOUNTER — Other Ambulatory Visit: Payer: Self-pay | Admitting: Neurology

## 2023-04-19 DIAGNOSIS — L570 Actinic keratosis: Secondary | ICD-10-CM | POA: Diagnosis not present

## 2023-05-11 NOTE — H&P (View-Only) (Signed)
Patient ID: Chad [name] MRN: [id number] DOB/AGE: [birth year] 77 y.o.  Primary Care Physician:Polite, Windy Fast, MD Primary Cardiologist: Charlton Haws, MD  FOCUSED CARDIOVASCULAR PROBLEM LIST:   1.  Severe aortic stenosis; EKG demonstrates sinus bradycardia without bundle-branch blocks 2.  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  Hyperlipidemia 4.  Hypertension 5.  CKD stage IV  HISTORY OF PRESENT ILLNESS: March 2023: The patient is a 77 y.o. male with the indicated medical history here for recommendations regarding progression of aortic stenosis.  The patient is here with his wife.  The patient denies any dyspnea, chest pain, presyncope, or syncope.  He has been using a reclining bicycle for some time.  He is able to do equivalent of 2000-2100 steps over a 30-minute period.  This has not changed over the last several years.  He fortunately has not required any emergency room visits or hospitalizations for cardiac issues including dyspnea, presyncope, or syncope.  He and his wife are avid travelers.  They have a son in [place] and another 1 in [place] whom they visit frequently.  He is scheduled to go on a cruise in [place] this July.  He went on a cruise last year.  The like to opt for low intensity activities on cruises.  He feels that if he exerted himself to a high degree he would likely be short of breath.  He sees a Education officer, community on a regular basis and reports very good dental health.  Plan:  Abdominal MRI, check BNP, start amlodipine.  June 2024: In the interim the patient had an abdominal MRI which demonstrated reassuring findings.  His BNP was found to be elevated over 500.  He continues to do well.  He has been modulating his activity level so as to not exert symptoms of shortness of breath.  For example he will not go upstairs and take an elevator if needed.  He still active and uses a recumbent bike routinely.  He did not denies any exertional chest pain,  presyncope, or syncope.  He has had no peripheral edema, paroxysmal atrial dyspnea, orthopnea.  He has required no emergency room visits or hospitalizations.  He did see his nephrologist recently and his kidney dysfunction is stable with no impending need for renal replacement therapy.  Plan: Follow-up in 4 months.  October 2024: The patient had an echocardiogram today which demonstrated a mean gradient of 51 mmHg with aortic valvular area of 0.8 cm.  He was seen by his nephrologist Dr. Valentino Nose who wanted to restart his losartan hydrochlorothiazide 100 x 25 mg.  In terms of other issues the patient denies any change in his symptom burden.  He still is able to walk at a moderate pace without exertional dyspnea or angina.  If he walks faster than this he will get short of breath.  He is not short of breath at rest.  He has had no presyncope or syncope.  He fortunately has not required any emergency room visits or hospitalizations.  Past Medical History:  Diagnosis Date   Cataract    CKD (chronic kidney disease)    Colon polyps    Diabetes (HCC)    Diabetic polyneuropathy associated with type 2 diabetes mellitus (HCC) 01/15/2017   Diabetic retinopathy (HCC)    GERD (gastroesophageal reflux disease)    Hypercholesterolemia    Hypertension    Hypertensive retinopathy    Hypothyroidism    Polyneuropathy    Proteinuria     No past  surgical history on file.  Family History  Problem Relation Age of Onset   Stroke Father    Glaucoma Father     Social History   Socioeconomic History   Marital status: Married    Spouse name: Not on file   Number of children: 2   Years of education: college   Highest education level: Not on file  Occupational History   Occupation: Health visitor  Tobacco Use   Smoking status: Former   Smokeless tobacco: Never  Advertising account planner   Vaping status: Never Used  Substance and Sexual Activity   Alcohol use: Yes   Drug use: No   Sexual activity: Not  Currently  Other Topics Concern   Not on file  Social History Narrative   Patient lives with wife in a 2 story home.  Has 2 children.  Works as a Health visitor.  Education: college.       Right handed   Social Determinants of Health   Financial Resource Strain: Not on file  Food Insecurity: Not on file  Transportation Needs: Not on file  Physical Activity: Not on file  Stress: Not on file  Social Connections: Not on file  Intimate Partner Violence: Not on file     Prior to Admission medications   Medication Sig Start Date End Date Taking? Authorizing Provider  B-D ULTRAFINE III SHORT PEN 31G X 8 MM MISC SMARTSIG:1 Each SUB-Q Daily 04/25/21   [provider]  Calcium Carb-Cholecalciferol (CALCIUM 500 + D) 500-125 MG-UNIT TABS Take 1 tablet by mouth daily.    [provider]  gabapentin (NEURONTIN) 300 MG capsule Take 2 tablet twice daily 08/24/22   Nita Sickle K, DO  insulin glargine (LANTUS) 100 UNIT/ML injection Inject 10 Units into the skin daily.    [provider]  JARDIANCE 10 MG TABS tablet Take 10 mg by mouth daily. 11/11/20   [provider]  levothyroxine (SYNTHROID, LEVOTHROID) 75 MCG tablet Take 75 mcg by mouth daily before breakfast.  12/28/16   [provider]  losartan-hydrochlorothiazide (HYZAAR) 100-25 MG tablet Take 1 tablet by mouth daily. 12/16/21   [provider]  metoprolol succinate (TOPROL-XL) 25 MG 24 hr tablet TAKE ONE TABLET BY MOUTH DAILY 03/24/22   Wendall Stade, MD  nortriptyline (PAMELOR) 10 MG capsule TAKE THREE CAPSULES BY MOUTH EVERY NIGHT AT BEDTIME 04/20/22   Glendale Chard, DO  ONETOUCH VERIO test strip  04/24/21   [provider]  rosuvastatin (CRESTOR) 40 MG tablet Take 1 tablet by mouth daily.    [provider]    No Known Allergies  REVIEW OF SYSTEMS:  General: no fevers/chills/night sweats Eyes: no blurry vision, diplopia, or amaurosis ENT: no sore throat  or hearing loss Resp: no cough, wheezing, or hemoptysis CV: no edema or palpitations GI: no abdominal pain, nausea, vomiting, diarrhea, or constipation GU: no dysuria, frequency, or hematuria Skin: no rash Neuro: no headache, numbness, tingling, or weakness of extremities Musculoskeletal: no joint pain or swelling Heme: no bleeding, DVT, or easy bruising Endo: no polydipsia or polyuria  BP 136/72   Pulse (!) 50   Ht 5\' 8"  (1.727 m)   Wt 185 lb 12.8 oz (84.3 kg)   SpO2 97%   BMI 28.25 kg/m   PHYSICAL EXAM: GEN:  AO x 3 in no acute distress HEENT: normal Dentition: Normal Neck: JVP normal. +2 carotid upstrokes without bruits. No thyromegaly. Lungs: equal expansion, clear bilaterally CV: Apex is  discrete and nondisplaced, RRR with 3/6 SEM Abd: soft, non-tender, non-distended; no bruit; positive bowel sounds Ext: no edema, ecchymoses, or cyanosis Vascular: 2+ femoral pulses, 2+ radial pulses       Skin: warm and dry without rash Neuro: CN II-XII grossly intact; motor and sensory grossly intact    DATA AND STUDIES:  EKG: Sinus bradycardia without bundle-branch blocks  October 2024:  1. Left ventricular ejection fraction, by estimation, is 60 to 65%. The  left ventricle has normal function. The left ventricle has no regional  wall motion abnormalities. There is mild concentric left ventricular  hypertrophy. Left ventricular diastolic  parameters are consistent with Grade I diastolic dysfunction (impaired  relaxation). The average left ventricular global longitudinal strain is  -17.1 %. The global longitudinal strain is abnormal.   2. Right ventricular systolic function is normal. The right ventricular  size is normal. There is normal pulmonary artery systolic pressure. The  estimated right ventricular systolic pressure is 31.1 mmHg.   3. The mitral valve is normal in structure. Mild mitral valve  regurgitation. No evidence of mitral stenosis.   4. The aortic valve is  tricuspid. There is severe calcifcation of the  aortic valve. Aortic valve regurgitation is not visualized. Severe aortic  valve stenosis. Aortic valve area, by VTI measures 0.80 cm. Aortic valve  mean gradient measures 51.0 mmHg.   5. The inferior vena cava is normal in size with greater than 50%  respiratory variability, suggesting right atrial pressure of 3 mmHg.   CTA: 2024 1.  Calcified tri leaflet AV with score 3449 2. Annular area of 539 mm2 upper value for 26 mm Sapien 3 valve Sinuses small for a 34 mm Evolut 3.  Coronary arteries sufficient height above annulus for deployment 4.  Membranous septal length 8.25 mm 5.  Angiographic Angle for deployment LAO 14 Caudal 14 degrees 6.  Indeterminant lesions of the liver and right kidney. 7.  5 mm groundglass nodule in the left upper lobe of lung  CARDIAC CATH: n/a  STS RISK CALCULATOR:   Procedure Type: Isolated AVR PERIOPERATIVE OUTCOME ESTIMATE % Operative Mortality 2.08% Morbidity & Mortality 12.3% Stroke 1.79% Renal Failure 4.78% Reoperation 4.59% Prolonged Ventilation 4.14% Deep Sternal Wound Infection 0.066% Long Hospital Stay (>14 days) 6.16% Short Hospital Stay (<6 days)* 43.8%  NHYA CLASS: 2  ASSESSMENT AND PLAN:   Severe aortic stenosis - Plan: ECHOCARDIOGRAM COMPLETE, Basic Metabolic Panel (BMET), CANCELED: Basic Metabolic Panel (BMET)  Type 2 diabetes mellitus with complication, with long-term current use of insulin (HCC)  Diabetic polyneuropathy associated with type 2 diabetes mellitus (HCC)  CKD (chronic kidney disease) stage 4, GFR 15-29 ml/min (HCC) - Plan: Basic Metabolic Panel (BMET), CANCELED: Basic Metabolic Panel (BMET)  Hypertension associated with diabetes (HCC)  Hyperlipidemia associated with type 2 diabetes mellitus (HCC)  The patient is relatively unchanged from a symptom burden standpoint.  He endorses NYHA class II symptoms of shortness of breath but he is modulating his activity and does  not seem to be bothered by this whatsoever.  His ejection fraction remains preserved.  I think it is okay for him to go ahead and restart his losartan hydrochlorothiazide.  I will reach out to his nephrologist to discuss this further.  His creatinine in September was 2.42.  I will check a BMP today.  I am concerned that the patient's renal disease would be worse later and that may preclude or make TAVR difficult without the initiation of hemodialysis.  I will discuss  this with his nephrologist.  Otherwise I will see him back in 6 months with another echocardiogram.  I spent 60 minutes reviewing all clinical data including imaging studies and prior notes, evaluating the patient, conducting a physical exam, and formulating a treatment plan.   Orbie Pyo, MD  05/14/2023 10:44 AM    [facility] [address] [place], [place], [place]  [address] Phone: [phone]; Fax: [phone]

## 2023-05-11 NOTE — Progress Notes (Addendum)
Patient ID: Chad [name] MRN: [id number] DOB/AGE: [birth year] 77 y.o.  Primary Care Physician:Polite, Windy Fast, MD Primary Cardiologist: Charlton Haws, MD  FOCUSED CARDIOVASCULAR PROBLEM LIST:   1.  Severe aortic stenosis; EKG demonstrates sinus bradycardia without bundle-branch blocks 2.  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  Hyperlipidemia 4.  Hypertension 5.  CKD stage IV  HISTORY OF PRESENT ILLNESS: March 2023: The patient is a 77 y.o. male with the indicated medical history here for recommendations regarding progression of aortic stenosis.  The patient is here with his wife.  The patient denies any dyspnea, chest pain, presyncope, or syncope.  He has been using a reclining bicycle for some time.  He is able to do equivalent of 2000-2100 steps over a 30-minute period.  This has not changed over the last several years.  He fortunately has not required any emergency room visits or hospitalizations for cardiac issues including dyspnea, presyncope, or syncope.  He and his wife are avid travelers.  They have a son in [place] and another 1 in [place] whom they visit frequently.  He is scheduled to go on a cruise in [place] this July.  He went on a cruise last year.  The like to opt for low intensity activities on cruises.  He feels that if he exerted himself to a high degree he would likely be short of breath.  He sees a Education officer, community on a regular basis and reports very good dental health.  Plan:  Abdominal MRI, check BNP, start amlodipine.  June 2024: In the interim the patient had an abdominal MRI which demonstrated reassuring findings.  His BNP was found to be elevated over 500.  He continues to do well.  He has been modulating his activity level so as to not exert symptoms of shortness of breath.  For example he will not go upstairs and take an elevator if needed.  He still active and uses a recumbent bike routinely.  He did not denies any exertional chest pain,  presyncope, or syncope.  He has had no peripheral edema, paroxysmal atrial dyspnea, orthopnea.  He has required no emergency room visits or hospitalizations.  He did see his nephrologist recently and his kidney dysfunction is stable with no impending need for renal replacement therapy.  Plan: Follow-up in 4 months.  October 2024: The patient had an echocardiogram today which demonstrated a mean gradient of 51 mmHg with aortic valvular area of 0.8 cm.  He was seen by his nephrologist Dr. Valentino Nose who wanted to restart his losartan hydrochlorothiazide 100 x 25 mg.  In terms of other issues the patient denies any change in his symptom burden.  He still is able to walk at a moderate pace without exertional dyspnea or angina.  If he walks faster than this he will get short of breath.  He is not short of breath at rest.  He has had no presyncope or syncope.  He fortunately has not required any emergency room visits or hospitalizations.  Past Medical History:  Diagnosis Date   Cataract    CKD (chronic kidney disease)    Colon polyps    Diabetes (HCC)    Diabetic polyneuropathy associated with type 2 diabetes mellitus (HCC) 01/15/2017   Diabetic retinopathy (HCC)    GERD (gastroesophageal reflux disease)    Hypercholesterolemia    Hypertension    Hypertensive retinopathy    Hypothyroidism    Polyneuropathy    Proteinuria     No past  surgical history on file.  Family History  Problem Relation Age of Onset   Stroke Father    Glaucoma Father     Social History   Socioeconomic History   Marital status: Married    Spouse name: Not on file   Number of children: 2   Years of education: college   Highest education level: Not on file  Occupational History   Occupation: Health visitor  Tobacco Use   Smoking status: Former   Smokeless tobacco: Never  Advertising account planner   Vaping status: Never Used  Substance and Sexual Activity   Alcohol use: Yes   Drug use: No   Sexual activity: Not  Currently  Other Topics Concern   Not on file  Social History Narrative   Patient lives with wife in a 2 story home.  Has 2 children.  Works as a Health visitor.  Education: college.       Right handed   Social Determinants of Health   Financial Resource Strain: Not on file  Food Insecurity: Not on file  Transportation Needs: Not on file  Physical Activity: Not on file  Stress: Not on file  Social Connections: Not on file  Intimate Partner Violence: Not on file     Prior to Admission medications   Medication Sig Start Date End Date Taking? Authorizing Provider  B-D ULTRAFINE III SHORT PEN 31G X 8 MM MISC SMARTSIG:1 Each SUB-Q Daily 04/25/21   [provider]  Calcium Carb-Cholecalciferol (CALCIUM 500 + D) 500-125 MG-UNIT TABS Take 1 tablet by mouth daily.    [provider]  gabapentin (NEURONTIN) 300 MG capsule Take 2 tablet twice daily 08/24/22   Nita Sickle K, DO  insulin glargine (LANTUS) 100 UNIT/ML injection Inject 10 Units into the skin daily.    [provider]  JARDIANCE 10 MG TABS tablet Take 10 mg by mouth daily. 11/11/20   [provider]  levothyroxine (SYNTHROID, LEVOTHROID) 75 MCG tablet Take 75 mcg by mouth daily before breakfast.  12/28/16   [provider]  losartan-hydrochlorothiazide (HYZAAR) 100-25 MG tablet Take 1 tablet by mouth daily. 12/16/21   [provider]  metoprolol succinate (TOPROL-XL) 25 MG 24 hr tablet TAKE ONE TABLET BY MOUTH DAILY 03/24/22   Wendall Stade, MD  nortriptyline (PAMELOR) 10 MG capsule TAKE THREE CAPSULES BY MOUTH EVERY NIGHT AT BEDTIME 04/20/22   Glendale Chard, DO  ONETOUCH VERIO test strip  04/24/21   [provider]  rosuvastatin (CRESTOR) 40 MG tablet Take 1 tablet by mouth daily.    [provider]    No Known Allergies  REVIEW OF SYSTEMS:  General: no fevers/chills/night sweats Eyes: no blurry vision, diplopia, or amaurosis ENT: no sore throat  or hearing loss Resp: no cough, wheezing, or hemoptysis CV: no edema or palpitations GI: no abdominal pain, nausea, vomiting, diarrhea, or constipation GU: no dysuria, frequency, or hematuria Skin: no rash Neuro: no headache, numbness, tingling, or weakness of extremities Musculoskeletal: no joint pain or swelling Heme: no bleeding, DVT, or easy bruising Endo: no polydipsia or polyuria  BP 136/72   Pulse (!) 50   Ht 5\' 8"  (1.727 m)   Wt 185 lb 12.8 oz (84.3 kg)   SpO2 97%   BMI 28.25 kg/m   PHYSICAL EXAM: GEN:  AO x 3 in no acute distress HEENT: normal Dentition: Normal Neck: JVP normal. +2 carotid upstrokes without bruits. No thyromegaly. Lungs: equal expansion, clear bilaterally CV: Apex is  discrete and nondisplaced, RRR with 3/6 SEM Abd: soft, non-tender, non-distended; no bruit; positive bowel sounds Ext: no edema, ecchymoses, or cyanosis Vascular: 2+ femoral pulses, 2+ radial pulses       Skin: warm and dry without rash Neuro: CN II-XII grossly intact; motor and sensory grossly intact    DATA AND STUDIES:  EKG: Sinus bradycardia without bundle-branch blocks  October 2024:  1. Left ventricular ejection fraction, by estimation, is 60 to 65%. The  left ventricle has normal function. The left ventricle has no regional  wall motion abnormalities. There is mild concentric left ventricular  hypertrophy. Left ventricular diastolic  parameters are consistent with Grade I diastolic dysfunction (impaired  relaxation). The average left ventricular global longitudinal strain is  -17.1 %. The global longitudinal strain is abnormal.   2. Right ventricular systolic function is normal. The right ventricular  size is normal. There is normal pulmonary artery systolic pressure. The  estimated right ventricular systolic pressure is 31.1 mmHg.   3. The mitral valve is normal in structure. Mild mitral valve  regurgitation. No evidence of mitral stenosis.   4. The aortic valve is  tricuspid. There is severe calcifcation of the  aortic valve. Aortic valve regurgitation is not visualized. Severe aortic  valve stenosis. Aortic valve area, by VTI measures 0.80 cm. Aortic valve  mean gradient measures 51.0 mmHg.   5. The inferior vena cava is normal in size with greater than 50%  respiratory variability, suggesting right atrial pressure of 3 mmHg.   CTA: 2024 1.  Calcified tri leaflet AV with score 3449 2. Annular area of 539 mm2 upper value for 26 mm Sapien 3 valve Sinuses small for a 34 mm Evolut 3.  Coronary arteries sufficient height above annulus for deployment 4.  Membranous septal length 8.25 mm 5.  Angiographic Angle for deployment LAO 14 Caudal 14 degrees 6.  Indeterminant lesions of the liver and right kidney. 7.  5 mm groundglass nodule in the left upper lobe of lung  CARDIAC CATH: n/a  STS RISK CALCULATOR:   Procedure Type: Isolated AVR PERIOPERATIVE OUTCOME ESTIMATE % Operative Mortality 2.08% Morbidity & Mortality 12.3% Stroke 1.79% Renal Failure 4.78% Reoperation 4.59% Prolonged Ventilation 4.14% Deep Sternal Wound Infection 0.066% Long Hospital Stay (>14 days) 6.16% Short Hospital Stay (<6 days)* 43.8%  NHYA CLASS: 2  ASSESSMENT AND PLAN:   Severe aortic stenosis - Plan: ECHOCARDIOGRAM COMPLETE, Basic Metabolic Panel (BMET), CANCELED: Basic Metabolic Panel (BMET)  Type 2 diabetes mellitus with complication, with long-term current use of insulin (HCC)  Diabetic polyneuropathy associated with type 2 diabetes mellitus (HCC)  CKD (chronic kidney disease) stage 4, GFR 15-29 ml/min (HCC) - Plan: Basic Metabolic Panel (BMET), CANCELED: Basic Metabolic Panel (BMET)  Hypertension associated with diabetes (HCC)  Hyperlipidemia associated with type 2 diabetes mellitus (HCC)  The patient is relatively unchanged from a symptom burden standpoint.  He endorses NYHA class II symptoms of shortness of breath but he is modulating his activity and does  not seem to be bothered by this whatsoever.  His ejection fraction remains preserved.  I think it is okay for him to go ahead and restart his losartan hydrochlorothiazide.  I will reach out to his nephrologist to discuss this further.  His creatinine in September was 2.42.  I will check a BMP today.  I am concerned that the patient's renal disease would be worse later and that may preclude or make TAVR difficult without the initiation of hemodialysis.  I will discuss  this with his nephrologist.  Otherwise I will see him back in 6 months with another echocardiogram.  I spent 60 minutes reviewing all clinical data including imaging studies and prior notes, evaluating the patient, conducting a physical exam, and formulating a treatment plan.   Orbie Pyo, MD  05/14/2023 10:44 AM    [facility] [address] [place], [place], [place]  [address] Phone: [phone]; Fax: [phone]

## 2023-05-14 ENCOUNTER — Ambulatory Visit (HOSPITAL_BASED_OUTPATIENT_CLINIC_OR_DEPARTMENT_OTHER): Payer: PPO

## 2023-05-14 ENCOUNTER — Other Ambulatory Visit (HOSPITAL_COMMUNITY): Payer: PPO

## 2023-05-14 ENCOUNTER — Encounter: Payer: Self-pay | Admitting: Internal Medicine

## 2023-05-14 ENCOUNTER — Ambulatory Visit (HOSPITAL_COMMUNITY): Payer: PPO | Attending: Internal Medicine | Admitting: Internal Medicine

## 2023-05-14 VITALS — BP 136/72 | HR 50 | Ht 68.0 in | Wt 185.8 lb

## 2023-05-14 DIAGNOSIS — E1159 Type 2 diabetes mellitus with other circulatory complications: Secondary | ICD-10-CM | POA: Diagnosis not present

## 2023-05-14 DIAGNOSIS — I5030 Unspecified diastolic (congestive) heart failure: Secondary | ICD-10-CM | POA: Diagnosis not present

## 2023-05-14 DIAGNOSIS — I350 Nonrheumatic aortic (valve) stenosis: Secondary | ICD-10-CM | POA: Diagnosis not present

## 2023-05-14 DIAGNOSIS — E1142 Type 2 diabetes mellitus with diabetic polyneuropathy: Secondary | ICD-10-CM | POA: Diagnosis not present

## 2023-05-14 DIAGNOSIS — E785 Hyperlipidemia, unspecified: Secondary | ICD-10-CM | POA: Insufficient documentation

## 2023-05-14 DIAGNOSIS — E1169 Type 2 diabetes mellitus with other specified complication: Secondary | ICD-10-CM

## 2023-05-14 DIAGNOSIS — Z794 Long term (current) use of insulin: Secondary | ICD-10-CM

## 2023-05-14 DIAGNOSIS — I080 Rheumatic disorders of both mitral and aortic valves: Secondary | ICD-10-CM | POA: Diagnosis not present

## 2023-05-14 DIAGNOSIS — I517 Cardiomegaly: Secondary | ICD-10-CM

## 2023-05-14 DIAGNOSIS — N184 Chronic kidney disease, stage 4 (severe): Secondary | ICD-10-CM | POA: Diagnosis not present

## 2023-05-14 DIAGNOSIS — E118 Type 2 diabetes mellitus with unspecified complications: Secondary | ICD-10-CM | POA: Diagnosis not present

## 2023-05-14 DIAGNOSIS — I152 Hypertension secondary to endocrine disorders: Secondary | ICD-10-CM | POA: Diagnosis not present

## 2023-05-14 LAB — ECHOCARDIOGRAM COMPLETE
AR max vel: 0.86 cm2
AV Area VTI: 0.8 cm2
AV Area mean vel: 0.82 cm2
AV Mean grad: 51 mm[Hg]
AV Peak grad: 78.5 mm[Hg]
Ao pk vel: 4.43 m/s
Area-P 1/2: 2.04 cm2
S' Lateral: 2 cm

## 2023-05-14 NOTE — Patient Instructions (Signed)
Medication Instructions:  No changes *If you need a refill on your cardiac medications before your next appointment, please call your pharmacy*   Lab Work: Today: bmet If you have labs (blood work) drawn today and your tests are completely normal, you will receive your results only by: MyChart Message (if you have MyChart) OR A paper copy in the mail If you have any lab test that is abnormal or we need to change your treatment, we will call you to review the results.   Testing/Procedures: ECHO DUE IN 6 MONTHS BEFORE NEXT VISIT Your physician has requested that you have an echocardiogram. Echocardiography is a painless test that uses sound waves to create images of your heart. It provides your doctor with information about the size and shape of your heart and how well your heart's chambers and valves are working. This procedure takes approximately one hour. There are no restrictions for this procedure. Please do NOT wear cologne, perfume, aftershave, or lotions (deodorant is allowed). Please arrive 15 minutes prior to your appointment time.   Follow-Up: At [facility], you and your health needs are our priority.  As part of our continuing mission to provide you with exceptional heart care, we have created designated Provider Care Teams.  These Care Teams include your primary Cardiologist (physician) and Advanced Practice Providers (APPs -  Physician Assistants and Nurse Practitioners) who all work together to provide you with the care you need, when you need it.   Your next appointment:   6 months  Provider:   Alverda Skeans,  MD (echocardiogram prior)

## 2023-05-14 NOTE — Addendum Note (Signed)
Addended by: Alverda Skeans on: 05/14/2023 01:05 PM   Modules accepted: Level of Service

## 2023-05-15 LAB — BASIC METABOLIC PANEL
BUN/Creatinine Ratio: 17 (ref 10–24)
BUN: 36 mg/dL — ABNORMAL HIGH (ref 8–27)
CO2: 24 mmol/L (ref 20–29)
Calcium: 9.2 mg/dL (ref 8.6–10.2)
Chloride: 102 mmol/L (ref 96–106)
Creatinine, Ser: 2.11 mg/dL — ABNORMAL HIGH (ref 0.76–1.27)
Glucose: 183 mg/dL — ABNORMAL HIGH (ref 70–99)
Potassium: 4.6 mmol/L (ref 3.5–5.2)
Sodium: 138 mmol/L (ref 134–144)
eGFR: 32 mL/min/{1.73_m2} — ABNORMAL LOW (ref >59)

## 2023-05-17 ENCOUNTER — Other Ambulatory Visit: Payer: Self-pay

## 2023-05-17 ENCOUNTER — Encounter: Payer: Self-pay | Admitting: Physician Assistant

## 2023-05-20 ENCOUNTER — Telehealth: Payer: Self-pay | Admitting: *Deleted

## 2023-05-20 NOTE — Telephone Encounter (Addendum)
Cardiac Catheterization scheduled at [facility] for: Friday May 21, 2023 10:30 AM Arrival time [facility] Main Entrance A at: 5:30 AM-pre-procedure hydration  Nothing to eat after midnight prior to procedure, clear liquids until 5 AM day of procedure.  Medication instructions: -Hold:  Insulin-1/2 usual Insulin dose HS prior to procedure, pt reports no Insulin AM  Jardiance-AM of procedure -Other usual morning medications can be taken with sips of water including aspirin 81 mg.  Pt does not have aspirin-will need to be given at hospital.  I confirmed with patient Losartan/HCT on hold.  Plan to go home the same day, you will only stay overnight if medically necessary.  You must have responsible adult to drive you home.  Someone must be with you the first 24 hours after you arrive home.  Reviewed procedure instructions with patient.

## 2023-05-21 ENCOUNTER — Other Ambulatory Visit: Payer: Self-pay

## 2023-05-21 ENCOUNTER — Encounter (HOSPITAL_COMMUNITY)
Admission: RE | Disposition: A | Discharge status: Home / Self Care | Payer: Self-pay | Source: Home / Self Care | Attending: Internal Medicine

## 2023-05-21 ENCOUNTER — Ambulatory Visit (HOSPITAL_COMMUNITY)
Admission: RE | Admit: 2023-05-21 | Discharge: 2023-05-21 | Disposition: A | Discharge status: Home / Self Care | Payer: PPO | Attending: Internal Medicine | Admitting: Internal Medicine

## 2023-05-21 DIAGNOSIS — N184 Chronic kidney disease, stage 4 (severe): Secondary | ICD-10-CM | POA: Insufficient documentation

## 2023-05-21 DIAGNOSIS — I2510 Atherosclerotic heart disease of native coronary artery without angina pectoris: Secondary | ICD-10-CM | POA: Diagnosis not present

## 2023-05-21 DIAGNOSIS — Z79899 Other long term (current) drug therapy: Secondary | ICD-10-CM | POA: Insufficient documentation

## 2023-05-21 DIAGNOSIS — I350 Nonrheumatic aortic (valve) stenosis: Secondary | ICD-10-CM | POA: Insufficient documentation

## 2023-05-21 DIAGNOSIS — E1142 Type 2 diabetes mellitus with diabetic polyneuropathy: Secondary | ICD-10-CM | POA: Insufficient documentation

## 2023-05-21 DIAGNOSIS — E785 Hyperlipidemia, unspecified: Secondary | ICD-10-CM | POA: Diagnosis not present

## 2023-05-21 DIAGNOSIS — Z794 Long term (current) use of insulin: Secondary | ICD-10-CM | POA: Insufficient documentation

## 2023-05-21 DIAGNOSIS — E1122 Type 2 diabetes mellitus with diabetic chronic kidney disease: Secondary | ICD-10-CM | POA: Insufficient documentation

## 2023-05-21 DIAGNOSIS — E1136 Type 2 diabetes mellitus with diabetic cataract: Secondary | ICD-10-CM | POA: Diagnosis not present

## 2023-05-21 DIAGNOSIS — Z01812 Encounter for preprocedural laboratory examination: Secondary | ICD-10-CM

## 2023-05-21 DIAGNOSIS — R0600 Dyspnea, unspecified: Secondary | ICD-10-CM | POA: Insufficient documentation

## 2023-05-21 DIAGNOSIS — I129 Hypertensive chronic kidney disease with stage 1 through stage 4 chronic kidney disease, or unspecified chronic kidney disease: Secondary | ICD-10-CM | POA: Insufficient documentation

## 2023-05-21 HISTORY — PX: RIGHT/LEFT HEART CATH AND CORONARY ANGIOGRAPHY: CATH118266

## 2023-05-21 LAB — CBC
HCT: 34.1 % — ABNORMAL LOW (ref 39.0–52.0)
Hemoglobin: 11.7 g/dL — ABNORMAL LOW (ref 13.0–17.0)
MCH: 31 pg (ref 26.0–34.0)
MCHC: 34.3 g/dL (ref 30.0–36.0)
MCV: 90.2 fL (ref 80.0–100.0)
Platelets: 132 10*3/uL — ABNORMAL LOW (ref 150–400)
RBC: 3.78 MIL/uL — ABNORMAL LOW (ref 4.22–5.81)
RDW: 12.7 % (ref 11.5–15.5)
WBC: 7.7 10*3/uL (ref 4.0–10.5)
nRBC: 0 % (ref 0.0–0.2)

## 2023-05-21 LAB — POCT I-STAT EG7
Acid-base deficit: 6 mmol/L — ABNORMAL HIGH (ref 0.0–2.0)
Acid-base deficit: 6 mmol/L — ABNORMAL HIGH (ref 0.0–2.0)
Bicarbonate: 20.5 mmol/L (ref 20.0–28.0)
Bicarbonate: 21 mmol/L (ref 20.0–28.0)
Calcium, Ion: 1.15 mmol/L (ref 1.15–1.40)
Calcium, Ion: 1.22 mmol/L (ref 1.15–1.40)
HCT: 31 % — ABNORMAL LOW (ref 39.0–52.0)
HCT: 32 % — ABNORMAL LOW (ref 39.0–52.0)
Hemoglobin: 10.5 g/dL — ABNORMAL LOW (ref 13.0–17.0)
Hemoglobin: 10.9 g/dL — ABNORMAL LOW (ref 13.0–17.0)
O2 Saturation: 61 %
O2 Saturation: 65 %
Potassium: 3.4 mmol/L — ABNORMAL LOW (ref 3.5–5.1)
Potassium: 3.6 mmol/L (ref 3.5–5.1)
Sodium: 135 mmol/L (ref 135–145)
Sodium: 138 mmol/L (ref 135–145)
TCO2: 22 mmol/L (ref 22–32)
TCO2: 22 mmol/L (ref 22–32)
pCO2, Ven: 45.8 mm[Hg] (ref 44–60)
pCO2, Ven: 46.3 mm[Hg] (ref 44–60)
pH, Ven: 7.259 (ref 7.25–7.43)
pH, Ven: 7.264 (ref 7.25–7.43)
pO2, Ven: 37 mm[Hg] (ref 32–45)
pO2, Ven: 39 mm[Hg] (ref 32–45)

## 2023-05-21 LAB — POCT I-STAT 7, (LYTES, BLD GAS, ICA,H+H)
Acid-base deficit: 6 mmol/L — ABNORMAL HIGH (ref 0.0–2.0)
Bicarbonate: 19.9 mmol/L — ABNORMAL LOW (ref 20.0–28.0)
Calcium, Ion: 1.24 mmol/L (ref 1.15–1.40)
HCT: 31 % — ABNORMAL LOW (ref 39.0–52.0)
Hemoglobin: 10.5 g/dL — ABNORMAL LOW (ref 13.0–17.0)
O2 Saturation: 91 %
Potassium: 3.7 mmol/L (ref 3.5–5.1)
Sodium: 140 mmol/L (ref 135–145)
TCO2: 21 mmol/L — ABNORMAL LOW (ref 22–32)
pCO2 arterial: 38.9 mm[Hg] (ref 32–48)
pH, Arterial: 7.316 — ABNORMAL LOW (ref 7.35–7.45)
pO2, Arterial: 65 mm[Hg] — ABNORMAL LOW (ref 83–108)

## 2023-05-21 LAB — GLUCOSE, CAPILLARY
Glucose-Capillary: 175 mg/dL — ABNORMAL HIGH (ref 70–99)
Glucose-Capillary: 184 mg/dL — ABNORMAL HIGH (ref 70–99)

## 2023-05-21 LAB — BASIC METABOLIC PANEL
Anion gap: 8 (ref 5–15)
BUN: 42 mg/dL — ABNORMAL HIGH (ref 8–23)
CO2: 21 mmol/L — ABNORMAL LOW (ref 22–32)
Calcium: 9 mg/dL (ref 8.9–10.3)
Chloride: 106 mmol/L (ref 98–111)
Creatinine, Ser: 2.31 mg/dL — ABNORMAL HIGH (ref 0.61–1.24)
GFR, Estimated: 29 mL/min — ABNORMAL LOW (ref >60)
Glucose, Bld: 185 mg/dL — ABNORMAL HIGH (ref 70–99)
Potassium: 4.1 mmol/L (ref 3.5–5.1)
Sodium: 135 mmol/L (ref 135–145)

## 2023-05-21 SURGERY — RIGHT/LEFT HEART CATH AND CORONARY ANGIOGRAPHY
Anesthesia: LOCAL

## 2023-05-21 MED ORDER — ACETAMINOPHEN 325 MG PO TABS: 650.0000 mg | ORAL_TABLET | ORAL | Status: DC | PRN

## 2023-05-21 MED ORDER — LIDOCAINE HCL (PF) 1 % IJ SOLN
INTRAMUSCULAR | Status: AC
  Filled 2023-05-21: qty 30

## 2023-05-21 MED ORDER — IOHEXOL 350 MG/ML SOLN
INTRAVENOUS | Status: DC | PRN
  Administered 2023-05-21: 27 mL

## 2023-05-21 MED ORDER — MIDAZOLAM HCL 2 MG/2ML IJ SOLN
INTRAMUSCULAR | Status: DC | PRN
  Administered 2023-05-21: 1 mg via INTRAVENOUS

## 2023-05-21 MED ORDER — HYDRALAZINE HCL 20 MG/ML IJ SOLN: 10.0000 mg | INTRAMUSCULAR | Status: DC | PRN

## 2023-05-21 MED ORDER — VERAPAMIL HCL 2.5 MG/ML IV SOLN
INTRAVENOUS | Status: AC
  Filled 2023-05-21: qty 2

## 2023-05-21 MED ORDER — SODIUM CHLORIDE 0.9 % WEIGHT BASED INFUSION: 1.0000 mL/kg/h | INTRAVENOUS | Status: DC

## 2023-05-21 MED ORDER — SODIUM CHLORIDE 0.9 % IV SOLN: 250.0000 mL | INTRAVENOUS | Status: DC | PRN

## 2023-05-21 MED ORDER — HEPARIN SODIUM (PORCINE) 1000 UNIT/ML IJ SOLN
INTRAMUSCULAR | Status: AC
  Filled 2023-05-21: qty 10

## 2023-05-21 MED ORDER — FENTANYL CITRATE (PF) 100 MCG/2ML IJ SOLN
INTRAMUSCULAR | Status: AC
  Filled 2023-05-21: qty 2

## 2023-05-21 MED ORDER — SODIUM CHLORIDE 0.9 % WEIGHT BASED INFUSION
3.0000 mL/kg/h | INTRAVENOUS | Status: DC
  Administered 2023-05-21: 3 mL/kg/h via INTRAVENOUS

## 2023-05-21 MED ORDER — MIDAZOLAM HCL 2 MG/2ML IJ SOLN
INTRAMUSCULAR | Status: AC
  Filled 2023-05-21: qty 2

## 2023-05-21 MED ORDER — VERAPAMIL HCL 2.5 MG/ML IV SOLN: INTRAVENOUS | Status: DC | PRN

## 2023-05-21 MED ORDER — ASPIRIN 81 MG PO CHEW
81.0000 mg | CHEWABLE_TABLET | ORAL | Status: AC
  Administered 2023-05-21: 81 mg via ORAL
  Filled 2023-05-21: qty 1

## 2023-05-21 MED ORDER — LABETALOL HCL 5 MG/ML IV SOLN: 10.0000 mg | INTRAVENOUS | Status: DC | PRN

## 2023-05-21 MED ORDER — HEPARIN SODIUM (PORCINE) 1000 UNIT/ML IJ SOLN
INTRAMUSCULAR | Status: DC | PRN
  Administered 2023-05-21: 5000 [IU] via INTRAVENOUS

## 2023-05-21 MED ORDER — SODIUM CHLORIDE 0.9% FLUSH: 3.0000 mL | INTRAVENOUS | Status: DC | PRN

## 2023-05-21 MED ORDER — SODIUM CHLORIDE 0.9% FLUSH: 3.0000 mL | Freq: Two times a day (BID) | INTRAVENOUS | Status: DC

## 2023-05-21 MED ORDER — ONDANSETRON HCL 4 MG/2ML IJ SOLN: 4.0000 mg | Freq: Four times a day (QID) | INTRAMUSCULAR | Status: DC | PRN

## 2023-05-21 MED ORDER — LIDOCAINE HCL (PF) 1 % IJ SOLN
INTRAMUSCULAR | Status: DC | PRN
  Administered 2023-05-21: 5 mL
  Administered 2023-05-21: 4 mL

## 2023-05-21 MED ORDER — FENTANYL CITRATE (PF) 100 MCG/2ML IJ SOLN
INTRAMUSCULAR | Status: DC | PRN
  Administered 2023-05-21: 25 ug via INTRAVENOUS

## 2023-05-21 MED ORDER — SODIUM CHLORIDE 0.9 % IV SOLN: INTRAVENOUS | Status: DC

## 2023-05-21 MED ORDER — HEPARIN (PORCINE) IN NACL 2000-0.9 UNIT/L-% IV SOLN
INTRAVENOUS | Status: DC | PRN
  Administered 2023-05-21: 1000 mL

## 2023-05-21 SURGICAL SUPPLY — 11 items
CATH BALLN WEDGE 5F 110CM (CATHETERS) IMPLANT
CATH INFINITI AMBI 6FR TG (CATHETERS) IMPLANT
DEVICE RAD COMP TR BAND LRG (VASCULAR PRODUCTS) IMPLANT
GLIDESHEATH SLEND SS 6F .021 (SHEATH) IMPLANT
KIT MICROPUNCTURE NIT STIFF (SHEATH) IMPLANT
PACK CARDIAC CATHETERIZATION (CUSTOM PROCEDURE TRAY) ×2 IMPLANT
SET ATX-X65L (MISCELLANEOUS) IMPLANT
SHEATH GLIDE SLENDER 4/5FR (SHEATH) IMPLANT
SHEATH PINNACLE 5F 10CM (SHEATH) IMPLANT
WIRE EMERALD 3MM-J .035X260CM (WIRE) IMPLANT
WIRE MICRO SET SILHO 5FR 7 (SHEATH) IMPLANT

## 2023-05-21 NOTE — Interval H&P Note (Signed)
History and Physical Interval Note:  05/21/2023 6:38 AM  Chad [name]  has presented today for surgery, with the diagnosis of aortic stenosis.  The various methods of treatment have been discussed with the patient and family. After consideration of risks, benefits and other options for treatment, the patient has consented to  Procedure(s): RIGHT/LEFT HEART CATH AND CORONARY ANGIOGRAPHY (N/A) as a surgical intervention.  The patient's history has been reviewed, patient examined, no change in status, stable for surgery.  I have reviewed the patient's chart and labs.  Questions were answered to the patient's satisfaction.     Orbie Pyo

## 2023-05-23 NOTE — Progress Notes (Unsigned)
[address]       [place] [id number]             [phone]           Chad [name] [place] Medical Record #[id number] Date of Birth: 1946/03/13  Chad Stade, MD Renford Dills, MD  Chief Complaint:  Aortic Stenosis  History of Present Illness:     Pt is a 77 yo male who has been followed by Dr Lynnette Caffey for severe AS which previously was felt to be relatively asymptomatic, Pt has Chronic kidney disease stage IV and IDDM Htn and retinopathy. Pt recently was evaluated and found to have progressive AS with a mean gradient of and normal LV function. His AVA was 0.8 and further work up consisted of cath with evidence of distal LV and ostial LAD and OM disease. Pt reports that he has stopped doing things that has made him SOB and feels well at this present level of activity. He denies CP. He has had follow up MRI of abdomen without concern for possible malignancy. He feels like he would like to proceed with TAVR to possibly do more      Past Medical History:  Diagnosis Date   Cataract    CKD (chronic kidney disease)    Colon polyps    Diabetes (HCC)    Diabetic polyneuropathy associated with type 2 diabetes mellitus (HCC) 01/15/2017   Diabetic retinopathy (HCC)    GERD (gastroesophageal reflux disease)    Hypercholesterolemia    Hypertension    Hypertensive retinopathy    Hypothyroidism    Polyneuropathy    Proteinuria    Severe aortic stenosis     No past surgical history on file.  Social History   Tobacco Use  Smoking Status Former  Smokeless Tobacco Never    Social History   Substance and Sexual Activity  Alcohol Use Yes    Social History   Socioeconomic History   Marital status: Married    Spouse name: Not on file   Number of children: 2   Years of education: college   Highest education level: Not on file  Occupational History   Occupation: Health visitor  Tobacco Use   Smoking status: Former   Smokeless  tobacco: Never  Advertising account planner   Vaping status: Never Used  Substance and Sexual Activity   Alcohol use: Yes   Drug use: No   Sexual activity: Not Currently  Other Topics Concern   Not on file  Social History Narrative   Patient lives with wife in a 2 story home.  Has 2 children.  Works as a Health visitor.  Education: college.       Right handed   Social Determinants of Health   Financial Resource Strain: Not on file  Food Insecurity: Not on file  Transportation Needs: Not on file  Physical Activity: Not on file  Stress: Not on file  Social Connections: Not on file  Intimate Partner Violence: Not on file    No Known Allergies  Current Outpatient Medications  Medication Sig Dispense Refill   amLODipine (NORVASC) 5 MG tablet Take 1 tablet (5 mg total) by mouth daily. 90 tablet 3   Ascorbic Acid (VITAMIN C) 500 MG CAPS Take 500 mg by mouth daily.     B-D ULTRAFINE III SHORT PEN 31G X 8 MM MISC SMARTSIG:1 Each SUB-Q Daily     Calcium Carb-Cholecalciferol (CALCIUM 500 + D) 500-125  MG-UNIT TABS Take 1 tablet by mouth daily.     Coenzyme Q10 (CO Q-10 PO) Take 1 capsule by mouth daily.     Cyanocobalamin 5000 MCG TBDP Place 5,000 mcg under the tongue daily.     gabapentin (NEURONTIN) 300 MG capsule Take 2 tablet twice daily 360 capsule 3   GLUCOSAMINE-CHONDROITIN PO Take 1 tablet by mouth daily.     insulin glargine (LANTUS) 100 UNIT/ML injection Inject 7 Units into the skin daily.     JARDIANCE 10 MG TABS tablet Take 10 mg by mouth daily.     levothyroxine (SYNTHROID, LEVOTHROID) 75 MCG tablet Take 75 mcg by mouth daily before breakfast.      losartan-hydrochlorothiazide (HYZAAR) 100-25 MG tablet Take 1 tablet by mouth daily. Currently on hold per Dr Valentino Nose to allow TAVR evaluation     metoprolol succinate (TOPROL-XL) 25 MG 24 hr tablet TAKE 1 TABLET BY MOUTH DAILY 90 tablet 2   Multiple Vitamin (MULTIVITAMIN WITH MINERALS) TABS tablet Take 1 tablet by mouth daily.      nortriptyline (PAMELOR) 10 MG capsule TAKE THREE CAPSULES BY MOUTH EVERY NIGHT AT BEDTIME 270 capsule 1   ONETOUCH VERIO test strip      rosuvastatin (CRESTOR) 40 MG tablet Take 40 mg by mouth daily.     No current facility-administered medications for this visit.     Family History  Problem Relation Age of Onset   Stroke Father    Glaucoma Father        Physical Exam: Teeth in good repair Lungs; clear Card: rr with 2/6 sem Ext: no edema Neuro intact     Diagnostic Studies & Laboratory data: I have personally reviewed the following studies and agree with the findings   TTE (04/2023) IMPRESSIONS     1. Left ventricular ejection fraction, by estimation, is 60 to 65%. The  left ventricle has normal function. The left ventricle has no regional  wall motion abnormalities. There is mild concentric left ventricular  hypertrophy. Left ventricular diastolic  parameters are consistent with Grade I diastolic dysfunction (impaired  relaxation). The average left ventricular global longitudinal strain is  -17.1 %. The global longitudinal strain is abnormal.   2. Right ventricular systolic function is normal. The right ventricular  size is normal. There is normal pulmonary artery systolic pressure. The  estimated right ventricular systolic pressure is 31.1 mmHg.   3. The mitral valve is normal in structure. Mild mitral valve  regurgitation. No evidence of mitral stenosis.   4. The aortic valve is tricuspid. There is severe calcifcation of the  aortic valve. Aortic valve regurgitation is not visualized. Severe aortic  valve stenosis. Aortic valve area, by VTI measures 0.80 cm. Aortic valve  mean gradient measures 51.0 mmHg.   5. The inferior vena cava is normal in size with greater than 50%  respiratory variability, suggesting right atrial pressure of 3 mmHg.   FINDINGS   Left Ventricle: Left ventricular ejection fraction, by estimation, is 60  to 65%. The left ventricle has  normal function. The left ventricle has no  regional wall motion abnormalities. The average left ventricular global  longitudinal strain is -17.1 %.  The global longitudinal strain is abnormal. The left ventricular internal  cavity size was normal in size. There is mild concentric left ventricular  hypertrophy. Left ventricular diastolic parameters are consistent with  Grade I diastolic dysfunction  (impaired relaxation).   Right Ventricle: The right ventricular size is normal. No increase in  right  ventricular wall thickness. Right ventricular systolic function is  normal. There is normal pulmonary artery systolic pressure. The tricuspid  regurgitant velocity is 2.65 m/s, and   with an assumed right atrial pressure of 3 mmHg, the estimated right  ventricular systolic pressure is 31.1 mmHg.   Left Atrium: Left atrial size was normal in size.   Right Atrium: Right atrial size was normal in size.   Pericardium: There is no evidence of pericardial effusion.   Mitral Valve: The mitral valve is normal in structure. Mild mitral annular  calcification. Mild mitral valve regurgitation. No evidence of mitral  valve stenosis.   Tricuspid Valve: The tricuspid valve is normal in structure. Tricuspid  valve regurgitation is trivial.   Aortic Valve: The aortic valve is tricuspid. There is severe calcifcation  of the aortic valve. Aortic valve regurgitation is not visualized. Severe  aortic stenosis is present. Aortic valve mean gradient measures 51.0 mmHg.  Aortic valve peak gradient  measures 78.5 mmHg. Aortic valve area, by VTI measures 0.80 cm.   Pulmonic Valve: The pulmonic valve was normal in structure. Pulmonic valve  regurgitation is not visualized.   Aorta: The aortic root is normal in size and structure.   Venous: The inferior vena cava is normal in size with greater than 50%  respiratory variability, suggesting right atrial pressure of 3 mmHg.   IAS/Shunts: No atrial level  shunt detected by color flow Doppler.     LEFT VENTRICLE  PLAX 2D  LVIDd:         4.40 cm   Diastology  LVIDs:         2.00 cm   LV e' medial:    4.90 cm/s  LV PW:         0.80 cm   LV E/e' medial:  17.7  LV IVS:        1.30 cm   LV e' lateral:   5.87 cm/s  LVOT diam:     2.30 cm   LV E/e' lateral: 14.7  LV SV:         97  LV SV Index:   49        2D Longitudinal Strain  LVOT Area:     4.15 cm  2D Strain GLS (A2C):   -15.4 %                           2D Strain GLS (A3C):   -19.2 %                           2D Strain GLS (A4C):   -16.5 %                           2D Strain GLS Avg:     -17.1 %                             3D Volume EF:                           3D EF:        60 %                           LV EDV:  141 ml                           LV ESV:       57 ml                           LV SV:        84 ml   RIGHT VENTRICLE  RV Basal diam:  3.40 cm  RV Mid diam:    3.50 cm  RV S prime:     11.70 cm/s  TAPSE (M-mode): 2.5 cm  RVSP:           31.1 mmHg   LEFT ATRIUM             Index        RIGHT ATRIUM           Index  LA diam:        4.80 cm 2.44 cm/m   RA Pressure: 3.00 mmHg  LA Vol (A2C):   58.1 ml 29.49 ml/m  RA Area:     13.20 cm  LA Vol (A4C):   52.2 ml 26.50 ml/m  RA Volume:   30.90 ml  15.69 ml/m  LA Biplane Vol: 56.1 ml 28.48 ml/m   AORTIC VALVE  AV Area (Vmax):    0.86 cm  AV Area (Vmean):   0.82 cm  AV Area (VTI):     0.80 cm  AV Vmax:           443.00 cm/s  AV Vmean:          317.800 cm/s  AV VTI:            1.210 m  AV Peak Grad:      78.5 mmHg  AV Mean Grad:      51.0 mmHg  LVOT Vmax:         91.20 cm/s  LVOT Vmean:        62.400 cm/s  LVOT VTI:          0.234 m  LVOT/AV VTI ratio: 0.19    AORTA  Ao Root diam: 3.20 cm  Ao Asc diam:  3.40 cm   MITRAL VALVE                TRICUSPID VALVE  MV Area (PHT): 2.04 cm     TR Peak grad:   28.1 mmHg  MV Decel Time: 372 msec     TR Vmax:        265.00 cm/s  MV E velocity: 86.50 cm/s    Estimated RAP:  3.00 mmHg  MV A velocity: 130.00 cm/s  RVSP:           31.1 mmHg  MV E/A ratio:  0.67                              SHUNTS                              Systemic VTI:  0.23 m                              Systemic Diam: 2.30 cm    Recent Radiology Findings:  CTA (08/2022) FINDINGS: Aortic Valve: Calcified tri leaflet AV with  score 3449   Aorta: Bovine arch no aneurysm mild calcific atherosclerosis   Sino-tubular Junction: 27 mm   Ascending Thoracic Aorta: 32.4 mm   Aortic Arch: 28.7 mm   Descending Thoracic Aorta: 24 mm   Sinus of Valsalva Measurements:   Non-coronary: 30.4 mm   Height 23.3 mm   Right - coronary: 30.3 mm  Height 19.3 mm   Left -   coronary: 29.1 mm  Height 20.5 mm   Coronary Artery Height above Annulus:   Left Main: 15.6 mm above annulus   Right Coronary: 15.5 mm above annulus   Virtual Basal Annulus Measurements:   Maximum / Minimum Diameter: 22.9 mm x 30.1 mm   Perimeter: 83.3 mm   Area: 539 mm2   Coronary Arteries: Sufficient height above annulus for deployment   Optimum Fluoroscopic Angle for Delivery: LAO 14 Caudal 14 degrees   Membranous septum length 8.25 mm   IMPRESSION: 1.  Calcified tri leaflet AV with score 3449   2. Annular area of 539 mm2 upper value for 26 mm Sapien 3 valve Sinuses small for a 34 mm Evolut   3.  Coronary arteries sufficient height above annulus for deployment   4.  Membranous septal length 8.25 mm   5.  Angiographic Angle for deployment LAO 14 Caudal 14 degrees    CARDIAC CATHETERIZATION  Result Date: 05/21/2023   Dist LM lesion is 70% stenosed.   Ost LAD lesion is 80% stenosed.   Mid LAD lesion is 30% stenosed.   Ost Cx to Prox Cx lesion is 80% stenosed.   Mid Cx to Dist Cx lesion is 40% stenosed.   Mid RCA to Dist RCA lesion is 80% stenosed. 1.  Distal left main, ostial LAD, ostial left circumflex, and distal right coronary artery disease.  Given the lack of exertional angina and the  patient's chronic kidney disease no interventions were pursued. 2.  Fick cardiac output of 6.4 L/min and Fick cardiac index of 3.2 L/min/m with the following hemodynamics:  Right atrial pressure mean of 9 mmHg  Right ventricular pressure 40/1 with an end-diastolic pressure of 10 mmHg  Wedge pressure mean of 11 mmHg with V waves to 16 mmHg  PA pressure 38/10 with a mean of 20 mmHg  PVR of 1.42 Woods units  PA pulsatility index of 3 3.  Total dye expenditure of 27 cc. Recommendation: Continue with evaluation for transcatheter attic valve replacement; at this time, continue  medical therapy for coronary artery disease in the context of patient's stage IV chronic kidney disease and lack of exertional angina.      Recent Lab Findings: Lab Results  Component Value Date   WBC 7.7 05/21/2023   HGB 10.5 (L) 05/21/2023   HCT 31.0 (L) 05/21/2023   PLT 132 (L) 05/21/2023   GLUCOSE 185 (H) 05/21/2023   NA 135 05/21/2023   K 3.4 (L) 05/21/2023   CL 106 05/21/2023   CREATININE 2.31 (H) 05/21/2023   BUN 42 (H) 05/21/2023   CO2 21 (L) 05/21/2023      Assessment / Plan:     77 yo male with NYHA class 2 symptoms of severe AS with normal LV function with CAD. Pt without angina and secondary to his CRF stage IV he would be a better TAVR candidate than SAVR/CABG and would be best to go via femoral access with a valve. Pt and wife understand the risks and goals and recovery with TAVR and that if needed, bailout would be  offered.    I have spent 60 min in review of the records, viewing studies and in face to face with patient and in coordination of future care    Eugenio Hoes 05/23/2023 10:50 AM

## 2023-05-24 ENCOUNTER — Encounter: Payer: Self-pay | Admitting: Thoracic Surgery (Cardiothoracic Vascular Surgery)

## 2023-05-24 ENCOUNTER — Encounter (HOSPITAL_COMMUNITY): Payer: Self-pay | Admitting: Internal Medicine

## 2023-05-24 ENCOUNTER — Institutional Professional Consult (permissible substitution): Payer: PPO | Admitting: Thoracic Surgery (Cardiothoracic Vascular Surgery)

## 2023-05-24 VITALS — BP 154/80 | HR 55 | Resp 18 | Ht 68.0 in | Wt 185.0 lb

## 2023-05-24 DIAGNOSIS — L821 Other seborrheic keratosis: Secondary | ICD-10-CM | POA: Diagnosis not present

## 2023-05-24 DIAGNOSIS — D225 Melanocytic nevi of trunk: Secondary | ICD-10-CM | POA: Diagnosis not present

## 2023-05-24 DIAGNOSIS — L738 Other specified follicular disorders: Secondary | ICD-10-CM | POA: Diagnosis not present

## 2023-05-24 DIAGNOSIS — I350 Nonrheumatic aortic (valve) stenosis: Secondary | ICD-10-CM | POA: Diagnosis not present

## 2023-05-24 DIAGNOSIS — L814 Other melanin hyperpigmentation: Secondary | ICD-10-CM | POA: Diagnosis not present

## 2023-05-24 DIAGNOSIS — L570 Actinic keratosis: Secondary | ICD-10-CM | POA: Diagnosis not present

## 2023-05-24 NOTE — Patient Instructions (Signed)
TAVR

## 2023-05-24 NOTE — Progress Notes (Signed)
Pre Surgical Assessment: 5 M Walk Test  40M=16.27ft  5 Meter Walk Test- trial 1: 7.98 seconds 5 Meter Walk Test- trial 2: 7.98 seconds 5 Meter Walk Test- trial 3: 7.10 seconds 5 Meter Walk Test Average: 7.68 seconds

## 2023-05-25 ENCOUNTER — Other Ambulatory Visit: Payer: Self-pay

## 2023-05-25 DIAGNOSIS — I350 Nonrheumatic aortic (valve) stenosis: Secondary | ICD-10-CM

## 2023-05-26 NOTE — Progress Notes (Signed)
[facility] - Clinic Note  06/08/2023    CHIEF COMPLAINT Patient presents for Retina Follow Up  HISTORY OF PRESENT ILLNESS: Chad [name] is a 77 y.o. male who presents to the clinic today for:   HPI     Retina Follow Up   Patient presents with  Diabetic Retinopathy.  In both eyes.  Severity is moderate.  Duration of 12 weeks.  Since onset it is stable.  I, the attending physician,  performed the HPI with the patient and updated documentation appropriately.        Comments   Patient states vision the same OU. BS was 156 this am. Last A1c was 5.8, checked 4-5 months ago.      Last edited by Rennis Chris, MD on 06/08/2023  4:17 PM.      Referring physician: Renford Dills, MD [address] [place],  [place] [address]  HISTORICAL INFORMATION:   Selected notes from the MEDICAL RECORD NUMBER Referred by Dr. Emily Filbert for diabetic retinopathy with DME OU [name]:  Ocular Hx- PMH-    CURRENT MEDICATIONS: No current outpatient medications on file. (Ophthalmic Drugs)   No current facility-administered medications for this visit. (Ophthalmic Drugs)   Current Outpatient Medications (Other)  Medication Sig   amLODipine (NORVASC) 5 MG tablet Take 1 tablet (5 mg total) by mouth daily.   Ascorbic Acid (VITAMIN C) 500 MG CAPS Take 500 mg by mouth daily.   B-D ULTRAFINE III SHORT PEN 31G X 8 MM MISC SMARTSIG:1 Each SUB-Q Daily   Calcium Carb-Cholecalciferol (CALCIUM 500 + D) 500-125 MG-UNIT TABS Take 1 tablet by mouth daily.   Coenzyme Q10 (CO Q-10 PO) Take 1 capsule by mouth daily.   Cyanocobalamin 5000 MCG TBDP Place 5,000 mcg under the tongue daily.   gabapentin (NEURONTIN) 300 MG capsule Take 2 tablet twice daily   GLUCOSAMINE-CHONDROITIN PO Take 1 tablet by mouth daily.   insulin glargine (LANTUS) 100 UNIT/ML injection Inject 7 Units into the skin daily.   JARDIANCE 10 MG TABS tablet Take 10 mg by mouth daily.   levothyroxine (SYNTHROID,  LEVOTHROID) 75 MCG tablet Take 75 mcg by mouth daily before breakfast.    losartan-hydrochlorothiazide (HYZAAR) 100-25 MG tablet Take 1 tablet by mouth daily. Currently on hold per Dr Valentino Nose to allow TAVR evaluation   metoprolol succinate (TOPROL-XL) 25 MG 24 hr tablet TAKE 1 TABLET BY MOUTH DAILY   Multiple Vitamin (MULTIVITAMIN WITH MINERALS) TABS tablet Take 1 tablet by mouth daily.   nortriptyline (PAMELOR) 10 MG capsule TAKE THREE CAPSULES BY MOUTH EVERY NIGHT AT BEDTIME   ONETOUCH VERIO test strip    rosuvastatin (CRESTOR) 40 MG tablet Take 40 mg by mouth daily.   No current facility-administered medications for this visit. (Other)   REVIEW OF SYSTEMS: ROS   Positive for: Endocrine, Cardiovascular, Eyes Negative for: Constitutional, Gastrointestinal, Neurological, Skin, Genitourinary, Musculoskeletal, HENT, Respiratory, Psychiatric, Allergic/Imm, Heme/Lymph Last edited by Annalee Genta D, COT on 06/08/2023  1:28 PM.     ALLERGIES No Known Allergies  PAST MEDICAL HISTORY Past Medical History:  Diagnosis Date   Cataract    CKD (chronic kidney disease)    Colon polyps    Diabetes (HCC)    Diabetic polyneuropathy associated with type 2 diabetes mellitus (HCC) 01/15/2017   Diabetic retinopathy (HCC)    GERD (gastroesophageal reflux disease)    Hypercholesterolemia    Hypertension    Hypertensive retinopathy    Hypothyroidism    Polyneuropathy  Proteinuria    Severe aortic stenosis    Past Surgical History:  Procedure Laterality Date   RIGHT/LEFT HEART CATH AND CORONARY ANGIOGRAPHY N/A 05/21/2023   Procedure: RIGHT/LEFT HEART CATH AND CORONARY ANGIOGRAPHY;  Surgeon: Orbie Pyo, MD;  Location: [facility];  Service: Cardiovascular;  Laterality: N/A;    FAMILY HISTORY Family History  Problem Relation Age of Onset   Stroke Father    Glaucoma Father    SOCIAL HISTORY Social History   Tobacco Use   Smoking status: Former   Smokeless tobacco: Never   Advertising account planner   Vaping status: Never Used  Substance Use Topics   Alcohol use: Yes   Drug use: No       OPHTHALMIC EXAM: Base Eye Exam     Visual Acuity (Snellen - Linear)       Right Left   Dist [place] 20/40 -2 20/60 +2   Dist ph [place] 20/25 -2 20/30 +2         Tonometry (Tonopen, 1:34 PM)       Right Left   Pressure 15 16         Pupils       Dark Light Shape React APD   Right 3 2 Round Brisk None   Left 3 2 Round Brisk None         Visual Fields (Counting fingers)       Left Right    Full Full         Extraocular Movement       Right Left    Full, Ortho Full, Ortho         Neuro/Psych     Oriented x3: Yes   Mood/Affect: Normal         Dilation     Both eyes: 1.0% Mydriacyl, 2.5% Phenylephrine @ 1:35 PM           Slit Lamp and Fundus Exam     External Exam       Right Left   External Normal Normal         Slit Lamp Exam       Right Left   Lids/Lashes Dermatochalasis - upper lid, mild Telangiectasia Dermatochalasis - upper lid, Meibomian gland dysfunction   Conjunctiva/Sclera White and quiet White and quiet   Cornea 2+ fine, inferior Punctate epithelial erosions 1+ Punctate epithelial erosions, mild tear film debris   Anterior Chamber deep, clear, narrow angles deep, clear, narrow angles   Iris Round and dilated, mild anterior bowing, No NVI Round and dilated, mild anterior bowing, No NVI   Lens 2-3+ Nuclear sclerosis, 2+ Cortical cataract 2-3+ Nuclear sclerosis, 2+ Cortical cataract   Anterior Vitreous Vitreous syneresis Vitreous syneresis         Fundus Exam       Right Left   Disc Pink and Sharp, no NVD Pink and Sharp, no NVD   C/D Ratio 0.3 0.3   Macula good foveal reflex, mild ERM, minimal MA and exudates, central cystic changes -- stably improved Flat, good foveal reflex, minimal MA and focal exudates, trace residual cystic changes superior mac -- improved   Vessels attenuated, Tortuous attenuated, Tortuous, Copper  wiring, Macroaneurysm / CWS along IT arcades - improving   Periphery Attached, scattered MA and CWS greatest posteriorly Attached, scattered MA/DBH greatest posteriorly, focal CWS inferior to IT arcades--fading           IMAGING AND PROCEDURES  Imaging and Procedures for 06/08/2023  OCT,  Retina - OU - Both Eyes       Right Eye Quality was good. Central Foveal Thickness: 297. Progression has been stable. Findings include no IRF, no SRF, abnormal foveal contour, intraretinal hyper-reflective material, epiretinal membrane, macular pucker (Stable improvement in IRF / IRHM temporal macula and fovea ).   Left Eye Quality was good. Central Foveal Thickness: 266. Progression has improved. Findings include normal foveal contour, no SRF, intraretinal hyper-reflective material, intraretinal fluid (Stable improvement IRF/IRHM centrally, trace persistent cystic changes sup mac -- slightly improved).   Notes *Images captured and stored on drive  Diagnosis / Impression:  +DME OU OD: Stable improvement in IRF / IRHM temporal macula and fovea  OS: Stable improvement IRF/IRHM centrally, trace persistent cystic changes sup mac -- slightly improved  Clinical management:  See below  Abbreviations: NFP - Normal foveal profile. CME - cystoid macular edema. PED - pigment epithelial detachment. IRF - intraretinal fluid. SRF - subretinal fluid. EZ - ellipsoid zone. ERM - epiretinal membrane. ORA - outer retinal atrophy. ORT - outer retinal tubulation. SRHM - subretinal hyper-reflective material. IRHM - intraretinal hyper-reflective material      Intravitreal Injection, Pharmacologic Agent - OD - Right Eye       Time Out 06/08/2023. 2:00 PM. Confirmed correct patient, procedure, site, and patient consented.   Anesthesia Topical anesthesia was used. Anesthetic medications included Lidocaine 2%, Proparacaine 0.5%.   Procedure Preparation included 5% betadine to ocular surface, eyelid speculum. A  (32g) needle was used.   Injection: 2 mg aflibercept 2 MG/0.05ML   Route: Intravitreal, Site: Right Eye   NDC: L6038910, Lot: [id number], Expiration date: 07/26/2024, Waste: 0 mL   Post-op Post injection exam found visual acuity of at least counting fingers. The patient tolerated the procedure well. There were no complications. The patient received written and verbal post procedure care education. Post injection medications were not given.      Intravitreal Injection, Pharmacologic Agent - OS - Left Eye       Time Out 06/08/2023. 2:00 PM. Confirmed correct patient, procedure, site, and patient consented.   Anesthesia Topical anesthesia was used. Anesthetic medications included Lidocaine 2%, Proparacaine 0.5%.   Procedure Preparation included 5% betadine to ocular surface, eyelid speculum. A (32g) needle was used.   Injection: 2 mg aflibercept 2 MG/0.05ML   Route: Intravitreal, Site: Left Eye   NDC: L6038910, Lot: [id number], Expiration date: 03/26/2024, Waste: 0 mL   Post-op Post injection exam found visual acuity of at least counting fingers. The patient tolerated the procedure well. There were no complications. The patient received written and verbal post procedure care education. Post injection medications were not given.            ASSESSMENT/PLAN:    ICD-10-CM   1. 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 Essential hypertension  I10     4. Hypertensive retinopathy of both eyes  H35.033     5. Combined forms of age-related cataract of both eyes  H25.813      1. Moderate  Non-proliferative diabetic retinopathy w/ DME  OU  - s/p IVA OD #1 (11.17.22) - s/p IVA OS #1 (11.14.22), #7 (05.12.23), #8 (06.09.23) - s/p IVA OU #2 (12.15.22), #3 (01.12.23), #4 (02.13.23), #5 (03.16.23), #6 (04.12.23),   **IVA resistance OU**  ============================================================ - s/p IVE OD #1 (05.12.23) -- sample, #2 (06.09.23), #3 (07.07.23), #4 (08.04.23), #5 (09.22.23), #6 (11.07.23), #7 (11.07.23), #8 (01.30.24), #9 (03.12.24), #10 (05.01.24), #11 (06.19.24), #12 (08.23.24) - s/p IVE OS #1 (07.07.23), #2 (08.04.23), #3 (09.22.23), #4 (11.07.23),#5 (11.07.23), #6 (01.30.24), #7 (03.12.24), #8 (05.01.24), #9 (06.19.24), #10 (08.23.24) - exam shows scattered MA/DBH OU, +edema and exudates - FA (11.14.22) shows vascular perfusion defects OU, no NV OU, OS with focal macroaneurysm along IT arcades - OCT shows OD: Stable improvement in IRF / IRHM temporal macula and fovea , OS: Stable improvement IRF/IRHM centrally, trace persistent cystic changes sup mac -- slightly improved at 12 weeks  - BCVA 20/25 OD, 20/30 OS-- stable - recommend IVE OU today (OD #13, OS #11) 11.12.24 with f/u in 12 weeks again - pt wishes to proceed with injections - RBA of procedure discussed, questions answered - IVE informed consent obtained and signed OU, 05.13.23 - IVE informed consent obtained and re-signed OU, 11.12.24 - see procedure note - f/u in 12 weeks, DFE, OCT, possible injection(s)  2. Retinal macroaneurysm OS  - focal lesion along IT arcades -- regressed  - s/p IVE OU as above  3,4. Hypertensive retinopathy OU - discussed importance of tight BP control - continue to monitor  5.  Mixed Cataract OU  - being monitored by Dr. Emily Filbert - The symptoms of cataract, surgical options, and treatments and risks were discussed with patient.  - discussed diagnosis and progression - approaching visual significance   Ophthalmic Meds Ordered this visit:  Meds ordered this encounter   Medications   aflibercept (EYLEA) SOLN 2 mg   aflibercept (EYLEA) SOLN 2 mg     Return in about 12 weeks (around 08/31/2023) for f/u NPDR OU , DFE, OCT, Possible, IVE, OU.  There are no Patient Instructions on file for this visit.   Explained the diagnoses, plan, and follow up with the patient and they expressed understanding.  Patient expressed understanding of the importance of proper follow up care.   This document serves as a record of services personally performed by Karie Chimera, MD, PhD. It was created on their behalf by Charlette Caffey, COT an ophthalmic technician. The creation of this record is the provider's dictation and/or activities during the visit.    Electronically signed by:  Charlette Caffey, COT  06/08/23 4:19 PM  Karie Chimera, M.D., Ph.D. Diseases & Surgery of the Retina and Vitreous [facility]  I have reviewed the above documentation for accuracy and completeness, and I agree with the above. Karie Chimera, M.D., Ph.D. 06/08/23 4:20 PM  Abbreviations: M myopia (nearsighted); A astigmatism; H hyperopia (farsighted); P presbyopia; Mrx spectacle prescription;  CTL contact lenses; OD right eye; OS left eye; OU both eyes  XT exotropia; ET esotropia; PEK punctate epithelial keratitis; PEE punctate epithelial erosions; DES dry eye syndrome; MGD meibomian gland dysfunction; ATs artificial tears; PFAT's preservative free artificial tears; NSC nuclear sclerotic cataract; PSC posterior subcapsular cataract; ERM epi-retinal membrane; PVD posterior vitreous detachment; RD retinal detachment; DM diabetes mellitus; DR diabetic retinopathy; NPDR non-proliferative diabetic retinopathy; PDR proliferative diabetic retinopathy; CSME clinically significant macular edema; DME diabetic macular edema; dbh dot blot hemorrhages; CWS cotton wool spot; POAG primary open angle  glaucoma; C/D cup-to-disc ratio; HVF humphrey visual field; GVF goldmann visual field;  OCT optical coherence tomography; IOP intraocular pressure; BRVO Branch retinal vein occlusion; CRVO central retinal vein occlusion; CRAO central retinal artery occlusion; BRAO branch retinal artery occlusion; RT retinal tear; SB scleral buckle; PPV pars plana vitrectomy; VH Vitreous hemorrhage; PRP panretinal laser photocoagulation; IVK intravitreal kenalog; VMT vitreomacular traction; MH Macular hole;  NVD neovascularization of the disc; NVE neovascularization elsewhere; AREDS age related eye disease study; ARMD age related macular degeneration; POAG primary open angle glaucoma; EBMD epithelial/anterior basement membrane dystrophy; ACIOL anterior chamber intraocular lens; IOL intraocular lens; PCIOL posterior chamber intraocular lens; Phaco/IOL phacoemulsification with intraocular lens placement; PRK photorefractive keratectomy; LASIK laser assisted in situ keratomileusis; HTN hypertension; DM diabetes mellitus; COPD chronic obstructive pulmonary disease

## 2023-06-04 ENCOUNTER — Encounter (INDEPENDENT_AMBULATORY_CARE_PROVIDER_SITE_OTHER): Payer: Self-pay | Admitting: Ophthalmology

## 2023-06-08 ENCOUNTER — Encounter (INDEPENDENT_AMBULATORY_CARE_PROVIDER_SITE_OTHER): Payer: Self-pay | Admitting: Ophthalmology

## 2023-06-08 ENCOUNTER — Ambulatory Visit (INDEPENDENT_AMBULATORY_CARE_PROVIDER_SITE_OTHER): Payer: PPO | Admitting: Ophthalmology

## 2023-06-08 DIAGNOSIS — E113313 Type 2 diabetes mellitus with moderate nonproliferative diabetic retinopathy with macular edema, bilateral: Secondary | ICD-10-CM

## 2023-06-08 DIAGNOSIS — I10 Essential (primary) hypertension: Secondary | ICD-10-CM

## 2023-06-08 DIAGNOSIS — H35012 Changes in retinal vascular appearance, left eye: Secondary | ICD-10-CM

## 2023-06-08 DIAGNOSIS — H25813 Combined forms of age-related cataract, bilateral: Secondary | ICD-10-CM

## 2023-06-08 DIAGNOSIS — H35033 Hypertensive retinopathy, bilateral: Secondary | ICD-10-CM | POA: Diagnosis not present

## 2023-06-08 MED ORDER — AFLIBERCEPT 2MG/0.05ML IZ SOLN FOR KALEIDOSCOPE
2.0000 mg | INTRAVITREAL | Status: AC | PRN
  Administered 2023-06-08: 2 mg via INTRAVITREAL

## 2023-06-09 ENCOUNTER — Other Ambulatory Visit: Payer: Self-pay

## 2023-06-11 ENCOUNTER — Other Ambulatory Visit (HOSPITAL_COMMUNITY): Payer: PPO

## 2023-06-11 ENCOUNTER — Encounter (INDEPENDENT_AMBULATORY_CARE_PROVIDER_SITE_OTHER): Payer: PPO | Admitting: Ophthalmology

## 2023-06-14 ENCOUNTER — Ambulatory Visit: Payer: PPO | Attending: Internal Medicine | Admitting: Internal Medicine

## 2023-06-14 ENCOUNTER — Encounter: Payer: Self-pay | Admitting: Internal Medicine

## 2023-06-14 VITALS — BP 154/76 | HR 70 | Ht 68.0 in | Wt 185.6 lb

## 2023-06-14 DIAGNOSIS — I700 Atherosclerosis of aorta: Secondary | ICD-10-CM

## 2023-06-14 DIAGNOSIS — N184 Chronic kidney disease, stage 4 (severe): Secondary | ICD-10-CM | POA: Diagnosis not present

## 2023-06-14 DIAGNOSIS — E785 Hyperlipidemia, unspecified: Secondary | ICD-10-CM

## 2023-06-14 DIAGNOSIS — I152 Hypertension secondary to endocrine disorders: Secondary | ICD-10-CM | POA: Diagnosis not present

## 2023-06-14 DIAGNOSIS — Z794 Long term (current) use of insulin: Secondary | ICD-10-CM

## 2023-06-14 DIAGNOSIS — I350 Nonrheumatic aortic (valve) stenosis: Secondary | ICD-10-CM | POA: Diagnosis not present

## 2023-06-14 DIAGNOSIS — E1169 Type 2 diabetes mellitus with other specified complication: Secondary | ICD-10-CM | POA: Diagnosis not present

## 2023-06-14 DIAGNOSIS — I2510 Atherosclerotic heart disease of native coronary artery without angina pectoris: Secondary | ICD-10-CM | POA: Diagnosis not present

## 2023-06-14 DIAGNOSIS — E1159 Type 2 diabetes mellitus with other circulatory complications: Secondary | ICD-10-CM | POA: Diagnosis not present

## 2023-06-14 DIAGNOSIS — E1142 Type 2 diabetes mellitus with diabetic polyneuropathy: Secondary | ICD-10-CM | POA: Diagnosis not present

## 2023-06-14 DIAGNOSIS — E118 Type 2 diabetes mellitus with unspecified complications: Secondary | ICD-10-CM

## 2023-06-14 NOTE — Patient Instructions (Addendum)
Medication Instructions:  No changes *If you need a refill on your cardiac medications before your next appointment, please call your pharmacy*   Lab Work: Today: bmet   Testing/Procedures: none   Follow-Up: Per Structural Heart Team  Other Instructions You have been referred to [facility] (Dr. Eugenio Hoes)

## 2023-06-15 ENCOUNTER — Inpatient Hospital Stay (HOSPITAL_COMMUNITY): Admit: 2023-06-15 | Payer: PPO | Admitting: Internal Medicine

## 2023-06-15 ENCOUNTER — Encounter: Payer: Self-pay | Admitting: Internal Medicine

## 2023-06-15 ENCOUNTER — Encounter (HOSPITAL_COMMUNITY): Payer: Self-pay

## 2023-06-15 DIAGNOSIS — I350 Nonrheumatic aortic (valve) stenosis: Secondary | ICD-10-CM

## 2023-06-15 LAB — BASIC METABOLIC PANEL
BUN/Creatinine Ratio: 16 (ref 10–24)
BUN: 30 mg/dL — ABNORMAL HIGH (ref 8–27)
CO2: 20 mmol/L (ref 20–29)
Calcium: 9.4 mg/dL (ref 8.6–10.2)
Chloride: 105 mmol/L (ref 96–106)
Creatinine, Ser: 1.84 mg/dL — ABNORMAL HIGH (ref 0.76–1.27)
Glucose: 157 mg/dL — ABNORMAL HIGH (ref 70–99)
Potassium: 4.4 mmol/L (ref 3.5–5.2)
Sodium: 141 mmol/L (ref 134–144)
eGFR: 38 mL/min/{1.73_m2} — ABNORMAL LOW (ref >59)

## 2023-06-15 SURGERY — TRANSCATHETER AORTIC VALVE REPLACEMENT, TRANSFEMORAL (CATHLAB)
Anesthesia: Monitor Anesthesia Care

## 2023-06-17 ENCOUNTER — Encounter: Payer: Self-pay | Admitting: Internal Medicine

## 2023-06-18 ENCOUNTER — Encounter: Payer: Self-pay | Admitting: Internal Medicine

## 2023-06-22 DIAGNOSIS — N184 Chronic kidney disease, stage 4 (severe): Secondary | ICD-10-CM | POA: Diagnosis not present

## 2023-06-23 DIAGNOSIS — N184 Chronic kidney disease, stage 4 (severe): Secondary | ICD-10-CM | POA: Diagnosis not present

## 2023-06-28 DIAGNOSIS — E1122 Type 2 diabetes mellitus with diabetic chronic kidney disease: Secondary | ICD-10-CM | POA: Diagnosis not present

## 2023-06-28 DIAGNOSIS — I129 Hypertensive chronic kidney disease with stage 1 through stage 4 chronic kidney disease, or unspecified chronic kidney disease: Secondary | ICD-10-CM | POA: Diagnosis not present

## 2023-06-28 DIAGNOSIS — I350 Nonrheumatic aortic (valve) stenosis: Secondary | ICD-10-CM | POA: Diagnosis not present

## 2023-06-28 DIAGNOSIS — E785 Hyperlipidemia, unspecified: Secondary | ICD-10-CM | POA: Diagnosis not present

## 2023-06-28 DIAGNOSIS — E039 Hypothyroidism, unspecified: Secondary | ICD-10-CM | POA: Diagnosis not present

## 2023-06-28 DIAGNOSIS — N184 Chronic kidney disease, stage 4 (severe): Secondary | ICD-10-CM | POA: Diagnosis not present

## 2023-07-01 ENCOUNTER — Ambulatory Visit: Payer: PPO | Admitting: Thoracic Surgery (Cardiothoracic Vascular Surgery)

## 2023-07-01 NOTE — Progress Notes (Unsigned)
[address]       [place] [address]             [phone]           HENDERSON HALILI [place] Medical Record #[id number] Date of Birth: 04/06/1946  Wendall Stade, MD Renford Dills, MD  Chief Complaint:     History of Present Illness:           Past Medical History:  Diagnosis Date   Cataract    CKD (chronic kidney disease)    Colon polyps    Diabetes [facility])    Diabetic polyneuropathy associated with type 2 diabetes mellitus (HCC) 01/15/2017   Diabetic retinopathy (HCC)    GERD (gastroesophageal reflux disease)    Hypercholesterolemia    Hypertension    Hypertensive retinopathy    Hypothyroidism    Polyneuropathy    Proteinuria    Severe aortic stenosis     Past Surgical History:  Procedure Laterality Date   RIGHT/LEFT HEART CATH AND CORONARY ANGIOGRAPHY N/A 05/21/2023   Procedure: RIGHT/LEFT HEART CATH AND CORONARY ANGIOGRAPHY;  Surgeon: Orbie Pyo, MD;  Location: [facility];  Service: Cardiovascular;  Laterality: N/A;    Social History   Tobacco Use  Smoking Status Former  Smokeless Tobacco Never    Social History   Substance and Sexual Activity  Alcohol Use Yes    Social History   Socioeconomic History   Marital status: Married    Spouse name: Not on file   Number of children: 2   Years of education: college   Highest education level: Not on file  Occupational History   Occupation: Health visitor  Tobacco Use   Smoking status: Former   Smokeless tobacco: Never  Advertising account planner   Vaping status: Never Used  Substance and Sexual Activity   Alcohol use: Yes   Drug use: No   Sexual activity: Not Currently  Other Topics Concern   Not on file  Social History Narrative   Patient lives with wife in a 2 story home.  Has 2 children.  Works as a Health visitor.  Education: college.       Right handed   Social Determinants of Health   Financial Resource Strain: Not on file  Food  Insecurity: Not on file  Transportation Needs: Not on file  Physical Activity: Not on file  Stress: Not on file  Social Connections: Not on file  Intimate Partner Violence: Not on file    No Known Allergies  Current Outpatient Medications  Medication Sig Dispense Refill   amLODipine (NORVASC) 5 MG tablet Take 1 tablet (5 mg total) by mouth daily. 90 tablet 3   Ascorbic Acid (VITAMIN C) 500 MG CAPS Take 500 mg by mouth daily.     B-D ULTRAFINE III SHORT PEN 31G X 8 MM MISC SMARTSIG:1 Each SUB-Q Daily     Calcium Carb-Cholecalciferol (CALCIUM 500 + D) 500-125 MG-UNIT TABS Take 1 tablet by mouth daily.     Coenzyme Q10 (CO Q-10 PO) Take 1 capsule by mouth daily.     Cyanocobalamin 5000 MCG TBDP Place 5,000 mcg under the tongue daily.     gabapentin (NEURONTIN) 300 MG capsule Take 2 tablet twice daily 360 capsule 3   GLUCOSAMINE-CHONDROITIN PO Take 1 tablet by mouth daily.     insulin glargine (LANTUS) 100 UNIT/ML injection Inject 7 Units into the skin daily.     JARDIANCE 10 MG  TABS tablet Take 10 mg by mouth daily.     levothyroxine (SYNTHROID, LEVOTHROID) 75 MCG tablet Take 75 mcg by mouth daily before breakfast.      losartan-hydrochlorothiazide (HYZAAR) 100-25 MG tablet Take 1 tablet by mouth daily. Currently on hold per Dr Valentino Nose to allow TAVR evaluation (Patient not taking: Reported on 06/14/2023)     metoprolol succinate (TOPROL-XL) 25 MG 24 hr tablet TAKE 1 TABLET BY MOUTH DAILY 90 tablet 2   Multiple Vitamin (MULTIVITAMIN WITH MINERALS) TABS tablet Take 1 tablet by mouth daily.     nortriptyline (PAMELOR) 10 MG capsule TAKE THREE CAPSULES BY MOUTH EVERY NIGHT AT BEDTIME 270 capsule 1   ONETOUCH VERIO test strip      rosuvastatin (CRESTOR) 40 MG tablet Take 40 mg by mouth daily.     No current facility-administered medications for this visit.     Family History  Problem Relation Age of Onset   Stroke Father    Glaucoma Father        Physical  Exam:      Diagnostic Studies & Laboratory data: I have personally reviewed the following studies and agree with the findings     Recent Radiology Findings:       Recent Lab Findings: Lab Results  Component Value Date   WBC 7.7 05/21/2023   HGB 10.5 (L) 05/21/2023   HCT 31.0 (L) 05/21/2023   PLT 132 (L) 05/21/2023   GLUCOSE 157 (H) 06/14/2023   NA 141 06/14/2023   K 4.4 06/14/2023   CL 105 06/14/2023   CREATININE 1.84 (H) 06/14/2023   BUN 30 (H) 06/14/2023   CO2 20 06/14/2023      Assessment / Plan:        I have spent *** min in review of the records, viewing studies and in face to face with patient and in coordination of future care    Eugenio Hoes 07/01/2023 5:10 PM

## 2023-07-02 ENCOUNTER — Ambulatory Visit: Payer: PPO | Admitting: Thoracic Surgery (Cardiothoracic Vascular Surgery)

## 2023-07-02 ENCOUNTER — Encounter: Payer: Self-pay | Admitting: Thoracic Surgery (Cardiothoracic Vascular Surgery)

## 2023-07-08 DIAGNOSIS — I2510 Atherosclerotic heart disease of native coronary artery without angina pectoris: Secondary | ICD-10-CM | POA: Diagnosis not present

## 2023-07-08 DIAGNOSIS — N183 Chronic kidney disease, stage 3 unspecified: Secondary | ICD-10-CM | POA: Diagnosis not present

## 2023-07-08 DIAGNOSIS — E1122 Type 2 diabetes mellitus with diabetic chronic kidney disease: Secondary | ICD-10-CM | POA: Diagnosis not present

## 2023-07-08 DIAGNOSIS — I350 Nonrheumatic aortic (valve) stenosis: Secondary | ICD-10-CM | POA: Diagnosis not present

## 2023-07-08 DIAGNOSIS — Z794 Long term (current) use of insulin: Secondary | ICD-10-CM | POA: Diagnosis not present

## 2023-07-08 DIAGNOSIS — E782 Mixed hyperlipidemia: Secondary | ICD-10-CM | POA: Diagnosis not present

## 2023-07-08 DIAGNOSIS — E118 Type 2 diabetes mellitus with unspecified complications: Secondary | ICD-10-CM | POA: Diagnosis not present

## 2023-07-08 DIAGNOSIS — I2583 Coronary atherosclerosis due to lipid rich plaque: Secondary | ICD-10-CM | POA: Diagnosis not present

## 2023-07-08 DIAGNOSIS — I10 Essential (primary) hypertension: Secondary | ICD-10-CM | POA: Diagnosis not present

## 2023-08-09 NOTE — Telephone Encounter (Signed)
 Scheduled patient to see Dr. Eden Emms first available with Dr. Eden Emms after surgery.

## 2023-08-15 DIAGNOSIS — I2510 Atherosclerotic heart disease of native coronary artery without angina pectoris: Secondary | ICD-10-CM | POA: Diagnosis not present

## 2023-08-15 DIAGNOSIS — I10 Essential (primary) hypertension: Secondary | ICD-10-CM | POA: Diagnosis not present

## 2023-08-15 DIAGNOSIS — Z952 Presence of prosthetic heart valve: Secondary | ICD-10-CM | POA: Diagnosis not present

## 2023-08-18 NOTE — Progress Notes (Shared)
[facility] - Clinic Note  08/31/2023    CHIEF COMPLAINT Patient presents for No chief complaint on file.  HISTORY OF PRESENT ILLNESS: Chad [name] is a 78 y.o. male who presents to the clinic today for:      Referring physician: Renford Dills, MD [address] [place] [facility] [place],  [place] [address]  HISTORICAL INFORMATION:   Selected notes from the MEDICAL RECORD NUMBER Referred by Dr. Emily Filbert for diabetic retinopathy with DME OU [name]:  Ocular Hx- PMH-    CURRENT MEDICATIONS: No current outpatient medications on file. (Ophthalmic Drugs)   No current facility-administered medications for this visit. (Ophthalmic Drugs)   Current Outpatient Medications (Other)  Medication Sig   amLODipine (NORVASC) 5 MG tablet Take 1 tablet (5 mg total) by mouth daily.   Ascorbic Acid (VITAMIN C) 500 MG CAPS Take 500 mg by mouth daily.   B-D ULTRAFINE III SHORT PEN 31G X 8 MM MISC SMARTSIG:1 Each SUB-Q Daily   Calcium Carb-Cholecalciferol (CALCIUM 500 + D) 500-125 MG-UNIT TABS Take 1 tablet by mouth daily.   Coenzyme Q10 (CO Q-10 PO) Take 1 capsule by mouth daily.   Cyanocobalamin 5000 MCG TBDP Place 5,000 mcg under the tongue daily.   gabapentin (NEURONTIN) 300 MG capsule Take 2 tablet twice daily   GLUCOSAMINE-CHONDROITIN PO Take 1 tablet by mouth daily.   insulin glargine (LANTUS) 100 UNIT/ML injection Inject 7 Units into the skin daily.   JARDIANCE 10 MG TABS tablet Take 10 mg by mouth daily.   levothyroxine (SYNTHROID, LEVOTHROID) 75 MCG tablet Take 75 mcg by mouth daily before breakfast.    losartan-hydrochlorothiazide (HYZAAR) 100-25 MG tablet Take 1 tablet by mouth daily. Currently on hold per Dr Valentino Nose to allow TAVR evaluation (Patient not taking: Reported on 06/14/2023)   metoprolol succinate (TOPROL-XL) 25 MG 24 hr tablet TAKE 1 TABLET BY MOUTH DAILY   Multiple Vitamin (MULTIVITAMIN WITH MINERALS) TABS tablet Take 1 tablet by mouth daily.   nortriptyline  (PAMELOR) 10 MG capsule TAKE THREE CAPSULES BY MOUTH EVERY NIGHT AT BEDTIME   ONETOUCH VERIO test strip    rosuvastatin (CRESTOR) 40 MG tablet Take 40 mg by mouth daily.   No current facility-administered medications for this visit. (Other)   REVIEW OF SYSTEMS:   ALLERGIES No Known Allergies  PAST MEDICAL HISTORY Past Medical History:  Diagnosis Date   Cataract    CKD (chronic kidney disease)    Colon polyps    Diabetes (HCC)    Diabetic polyneuropathy associated with type 2 diabetes mellitus (HCC) 01/15/2017   Diabetic retinopathy (HCC)    GERD (gastroesophageal reflux disease)    Hypercholesterolemia    Hypertension    Hypertensive retinopathy    Hypothyroidism    Polyneuropathy    Proteinuria    Severe aortic stenosis    Past Surgical History:  Procedure Laterality Date   RIGHT/LEFT HEART CATH AND CORONARY ANGIOGRAPHY N/A 05/21/2023   Procedure: RIGHT/LEFT HEART CATH AND CORONARY ANGIOGRAPHY;  Surgeon: Orbie Pyo, MD;  Location: [facility];  Service: Cardiovascular;  Laterality: N/A;    FAMILY HISTORY Family History  Problem Relation Age of Onset   Stroke Father    Glaucoma Father    SOCIAL HISTORY Social History   Tobacco Use   Smoking status: Former   Smokeless tobacco: Never  Vaping Use   Vaping status: Never Used  Substance Use Topics   Alcohol use: Yes   Drug use: No  OPHTHALMIC EXAM: Not recorded    IMAGING AND PROCEDURES  Imaging and Procedures for 08/31/2023          ASSESSMENT/PLAN:    ICD-10-CM   1. 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 Essential hypertension  I10     4. Hypertensive retinopathy of both eyes  H35.033     5. Combined forms of age-related cataract of both eyes  H25.813      1. Moderate Non-proliferative diabetic retinopathy w/ DME OU  -  s/p IVA OD #1 (11.17.22) - s/p IVA OS #1 (11.14.22), #7 (05.12.23), #8 (06.09.23) - s/p IVA OU #2 (12.15.22), #3 (01.12.23), #4 (02.13.23), #5 (03.16.23), #6 (04.12.23),   **IVA resistance OU**  ============================================================ - s/p IVE OD #1 (05.12.23) -- sample, #2 (06.09.23), #3 (07.07.23), #4 (08.04.23), #5 (09.22.23), #6 (11.07.23), #7 (11.07.23), #8 (01.30.24), #9 (03.12.24), #10 (05.01.24), #11 (06.19.24), #12 (08.23.24), #13 (11.12.24) - s/p IVE OS #1 (07.07.23), #2 (08.04.23), #3 (09.22.23), #4 (11.07.23),#5 (11.07.23), #6 (01.30.24), #7 (03.12.24), #8 (05.01.24), #9 (06.19.24), #10 (08.23.24), #11 (11.12.24) - exam shows scattered MA/DBH OU, +edema and exudates - FA (11.14.22) shows vascular perfusion defects OU, no NV OU, OS with focal macroaneurysm along IT arcades - OCT shows OD: Stable improvement in IRF / IRHM temporal macula and fovea , OS: Stable improvement IRF/IRHM centrally, trace persistent cystic changes sup mac -- slightly improved at 12 weeks  - BCVA 20/25 OD, 20/30 OS-- stable - recommend IVE OU today (OD #14, OS #12)02.04.25 with f/u in 12 weeks again - pt wishes to proceed with injections - RBA of procedure discussed, questions answered - IVE informed consent obtained and signed OU, 05.13.23 - IVE informed consent obtained and re-signed OU, 11.12.24 - see procedure note - f/u in 12 weeks, DFE, OCT, possible injection(s)  2. Retinal macroaneurysm OS  - focal lesion along IT arcades -- regressed  - s/p IVE OU as above  3,4. Hypertensive retinopathy OU - discussed importance of tight BP control - continue to monitor  5.  Mixed Cataract OU  - being monitored by Dr. Emily Filbert - The symptoms of cataract, surgical options, and treatments and risks were discussed with patient.  - discussed diagnosis and progression - approaching visual significance   Ophthalmic Meds Ordered this visit:  No orders of the defined types were placed in this  encounter.    No follow-ups on file.  There are no Patient Instructions on file for this visit.   Explained the diagnoses, plan, and follow up with the patient and they expressed understanding.  Patient expressed understanding of the importance of proper follow up care.   This document serves as a record of services personally performed by Karie Chimera, MD, PhD. It was created on their behalf by Charlette Caffey, COT an ophthalmic technician. The creation of this record is the provider's dictation and/or activities during the visit.    Electronically signed by:  Charlette Caffey, COT  08/18/23 7:53 AM  Karie Chimera, M.D., Ph.D. Diseases & Surgery of the Retina and Vitreous [facility]    Abbreviations: M myopia (nearsighted); A astigmatism; H hyperopia (farsighted); P presbyopia; Mrx spectacle prescription;  CTL contact lenses; OD right eye; OS left eye; OU both eyes  XT exotropia; ET esotropia; PEK punctate epithelial keratitis; PEE punctate epithelial  erosions; DES dry eye syndrome; MGD meibomian gland dysfunction; ATs artificial tears; PFAT's preservative free artificial tears; NSC nuclear sclerotic cataract; PSC posterior subcapsular cataract; ERM epi-retinal membrane; PVD posterior vitreous detachment; RD retinal detachment; DM diabetes mellitus; DR diabetic retinopathy; NPDR non-proliferative diabetic retinopathy; PDR proliferative diabetic retinopathy; CSME clinically significant macular edema; DME diabetic macular edema; dbh dot blot hemorrhages; CWS cotton wool spot; POAG primary open angle glaucoma; C/D cup-to-disc ratio; HVF humphrey visual field; GVF goldmann visual field; OCT optical coherence tomography; IOP intraocular pressure; BRVO Branch retinal vein occlusion; CRVO central retinal vein occlusion; CRAO central retinal artery occlusion; BRAO branch retinal artery occlusion; RT retinal tear; SB scleral buckle; PPV pars plana vitrectomy; VH  Vitreous hemorrhage; PRP panretinal laser photocoagulation; IVK intravitreal kenalog; VMT vitreomacular traction; MH Macular hole;  NVD neovascularization of the disc; NVE neovascularization elsewhere; AREDS age related eye disease study; ARMD age related macular degeneration; POAG primary open angle glaucoma; EBMD epithelial/anterior basement membrane dystrophy; ACIOL anterior chamber intraocular lens; IOL intraocular lens; PCIOL posterior chamber intraocular lens; Phaco/IOL phacoemulsification with intraocular lens placement; PRK photorefractive keratectomy; LASIK laser assisted in situ keratomileusis; HTN hypertension; DM diabetes mellitus; COPD chronic obstructive pulmonary disease

## 2023-08-23 ENCOUNTER — Ambulatory Visit: Payer: PPO | Attending: Cardiology | Admitting: Cardiology

## 2023-08-23 ENCOUNTER — Encounter: Payer: Self-pay | Admitting: Cardiovascular Disease

## 2023-08-23 ENCOUNTER — Encounter: Payer: Self-pay | Admitting: Cardiology

## 2023-08-23 VITALS — BP 130/64 | HR 59 | Ht 68.0 in | Wt 208.2 lb

## 2023-08-23 DIAGNOSIS — I10 Essential (primary) hypertension: Secondary | ICD-10-CM

## 2023-08-23 DIAGNOSIS — I152 Hypertension secondary to endocrine disorders: Secondary | ICD-10-CM | POA: Diagnosis not present

## 2023-08-23 DIAGNOSIS — I493 Ventricular premature depolarization: Secondary | ICD-10-CM

## 2023-08-23 DIAGNOSIS — N184 Chronic kidney disease, stage 4 (severe): Secondary | ICD-10-CM

## 2023-08-23 DIAGNOSIS — I4891 Unspecified atrial fibrillation: Secondary | ICD-10-CM

## 2023-08-23 DIAGNOSIS — I350 Nonrheumatic aortic (valve) stenosis: Secondary | ICD-10-CM

## 2023-08-23 DIAGNOSIS — I700 Atherosclerosis of aorta: Secondary | ICD-10-CM

## 2023-08-23 DIAGNOSIS — I2510 Atherosclerotic heart disease of native coronary artery without angina pectoris: Secondary | ICD-10-CM | POA: Diagnosis not present

## 2023-08-23 DIAGNOSIS — E1159 Type 2 diabetes mellitus with other circulatory complications: Secondary | ICD-10-CM | POA: Diagnosis not present

## 2023-08-23 MED ORDER — AMIODARONE HCL 400 MG PO TABS: ORAL_TABLET | ORAL | Status: DC

## 2023-08-23 MED ORDER — MEXILETINE HCL 200 MG PO CAPS: ORAL_CAPSULE | ORAL | Status: DC

## 2023-08-23 NOTE — Telephone Encounter (Signed)
Error

## 2023-08-23 NOTE — Patient Instructions (Signed)
Medication Instructions:  Your physician has recommended you make the following change in your medication:   REDUCE the Mexiletine to 200 mg taking 1 twice a day until Sunday, 08/29/23, then you can stop it  TAKE Amiodarone 400 mg twice a day until Sunday, 08/29/23, then reduce it to 400 mg taking 1 daily   *If you need a refill on your cardiac medications before your next appointment, please call your pharmacy*   Lab Work: TODAY:  RENAL PANEL & CBC  If you have labs (blood work) drawn today and your tests are completely normal, you will receive your results only by: MyChart Message (if you have MyChart) OR A paper copy in the mail If you have any lab test that is abnormal or we need to change your treatment, we will call you to review the results.   Testing/Procedures: None ordered   You have been referred to EP for Ventricular Ectopy  Follow-Up: At [facility], you and your health needs are our priority.  As part of our continuing mission to provide you with exceptional heart care, we have created designated Provider Care Teams.  These Care Teams include your primary Cardiologist (physician) and Advanced Practice Providers (APPs -  Physician Assistants and Nurse Practitioners) who all work together to provide you with the care you need, when you need it.  We recommend signing up for the patient portal called "MyChart".  Sign up information is provided on this After Visit Summary.  MyChart is used to connect with patients for Virtual Visits (Telemedicine).  Patients are able to view lab/test results, encounter notes, upcoming appointments, etc.  Non-urgent messages can be sent to your provider as well.   To learn more about what you can do with MyChart, go to [email or web address].    Your next appointment:   As scheduled  Provider:   Charlton Haws, MD     Other Instructions   1st Floor: - Lobby - Registration  - Pharmacy  - Lab - Cafe  2nd Floor: - PV Lab -  Diagnostic Testing (echo, CT, nuclear med)  3rd Floor: - Vacant  4th Floor: - TCTS (cardiothoracic surgery) - AFib Clinic - Structural Heart Clinic - Vascular Surgery  - Vascular Ultrasound  5th Floor: - [facility] (general and EP) - Clinical Pharmacy for coumadin, hypertension, lipid, weight-loss medications, and med management appointments    Valet parking services will be available as well.

## 2023-08-23 NOTE — Progress Notes (Unsigned)
Cardiology Office Note:    Date:  08/24/2023   ID:  Chad [name], DOB 04-Dec-1945, MRN [id number]  PCP:  Renford Dills, MD   [facility] Providers Cardiologist:  Charlton Haws, MD     Referring MD: Renford Dills, MD   Chief Complaint  Patient presents with   Follow-up   History of Present Illness:    Chad [name] is a 78 y.o. male with a hx of HTN, HLD, CKD IV (baseline Cr 1.8), and DM2 who was recently found to have severe AS and 3V CAD who presents for post-CABG/AVR (Dr. Kizzie Bane at [facility]) follow up.   Chad [name] is followed by Dr. Eden Emms for his cardiology care and was referred to Dr. Lynnette Caffey for possible TAVR given new, symptomatic severe aortic stenosis. Unfortunately pre TAVR cardiac cath showed significant CAD and the patient was not felt to be a good candidate for CABG/AVR or PCI and TAVR given significant co-morbilities. He opted for a second opinion and was seen at [facility]. He is now s/p CABG/AVR with Dr. Kizzie Bane.   In extensive chart review, it appears his hospital course was complicated by significant AKI with a peak Cr >5.0, urinary retention requiring catheter placement followed by urology, and new post-op atrial fibrillation with significant ventricular ectopy treated with amiodarone and mexiletine with plans for EP referral.   He is here today with his wife and son (who is a GI physician in [place]). He has a follow up with Dr. Eden Emms however the son leaves to go home tomorrow and wished to have some post discharge questions answered prior to leaving. He helps decipher the events at [facility]. Overall, it sounds like his surgery went well but given the above issues his hospitalization was around 11 days. He struggled with urinary retention but has since done well and is urinating on his own without issues. No follow up was indicated with urology. He had new atrial fibrillation which the son reports as brief and he was not placed on anticoagulation given this.  Unfortunately, ventricular ectopy was also an issue and was treated with mexiletine. He was in NSR on discharge day and EKG today shows sinus bradycardia at 59bpm. Many of his antihypertensives were held at discharge given issues with severe orthostatic hypotension. This has also since resolved. He normally follows with Dr. Burgess Estelle with nephrology and his son will call for a follow up appointment with him. The patient does have some LE edema and would benefit from a little Lasix however given Cr on 1/25 at 4.7 will message with nephrology prior to starting. He has follow up with Dr. Kizzie Bane' team next week. Overall the patient is doing well and denies chest pain, SOB, palpitations, orthopnea, PND, dizziness, or syncope. Rather weak but looking to work with cardiac rehab now that he is back home. Also has home PT/OT at his residence which he participates.  Past Medical History:  Diagnosis Date   Cataract    CKD (chronic kidney disease)    Colon polyps    Diabetes (HCC)    Diabetic polyneuropathy associated with type 2 diabetes mellitus (HCC) 01/15/2017   Diabetic retinopathy (HCC)    GERD (gastroesophageal reflux disease)    Hypercholesterolemia    Hypertension    Hypertensive retinopathy    Hypothyroidism    Polyneuropathy    Proteinuria    Severe aortic stenosis     Past Surgical History:  Procedure Laterality Date   RIGHT/LEFT HEART CATH AND CORONARY ANGIOGRAPHY N/A  05/21/2023   Procedure: RIGHT/LEFT HEART CATH AND CORONARY ANGIOGRAPHY;  Surgeon: Orbie Pyo, MD;  [facility] INV[facility] LAB;  Service: Cardiovascular;  Laterality: N/A;    Current Medications: Current Meds  Medication Sig   amiodarone (PACERONE) 400 MG tablet TAKE 1 TABLET BY MOUTH TWICE A DAY UNTIL 08/29/23, THEN REDUCE TO 1 TABLET BY MOUTH DAILY   B-D ULTRAFINE III SHORT PEN 31G X 8 MM MISC SMARTSIG:1 Each SUB-Q Daily   insulin glargine (LANTUS) 100 UNIT/ML injection Inject 15 Units into the skin daily.    insulin lispro (HUMALOG) 100 UNIT/ML injection Inject 15 Units into the skin daily.   JARDIANCE 10 MG TABS tablet Take 10 mg by mouth daily.   levothyroxine (SYNTHROID, LEVOTHROID) 75 MCG tablet Take 75 mcg by mouth daily before breakfast.    metoprolol tartrate (LOPRESSOR) 25 MG tablet Take 12.5 mg by mouth 2 (two) times daily.   mexiletine (MEXITIL) 200 MG capsule TAKE 1 TABLET TWICE A DAY UNTIL 08/29/23 THEN YOU CAN STOP IT   ONETOUCH VERIO test strip    polyethylene glycol (MIRALAX / GLYCOLAX) 17 g packet Take 17 g by mouth every other day.   rosuvastatin (CRESTOR) 20 MG tablet Take 20 mg by mouth daily.   [DISCONTINUED] amiodarone (PACERONE) 400 MG tablet Take 400 mg by mouth 2 (two) times daily.   [DISCONTINUED] amLODipine (NORVASC) 5 MG tablet Take 1 tablet (5 mg total) by mouth daily.   [DISCONTINUED] Ascorbic Acid (VITAMIN C) 500 MG CAPS Take 500 mg by mouth daily.   [DISCONTINUED] Calcium Carb-Cholecalciferol (CALCIUM 500 + D) 500-125 MG-UNIT TABS Take 1 tablet by mouth daily.   [DISCONTINUED] Coenzyme Q10 (CO Q-10 PO) Take 1 capsule by mouth daily.   [DISCONTINUED] Cyanocobalamin 5000 MCG TBDP Place 5,000 mcg under the tongue daily.   [DISCONTINUED] gabapentin (NEURONTIN) 300 MG capsule Take 2 tablet twice daily   [DISCONTINUED] GLUCOSAMINE-CHONDROITIN PO Take 1 tablet by mouth daily.   [DISCONTINUED] losartan-hydrochlorothiazide (HYZAAR) 100-25 MG tablet Take 1 tablet by mouth daily. Currently on hold per Dr Valentino Nose to allow TAVR evaluation   [DISCONTINUED] metoprolol succinate (TOPROL-XL) 25 MG 24 hr tablet TAKE 1 TABLET BY MOUTH DAILY   [DISCONTINUED] mexiletine (MEXITIL) 200 MG capsule Take 200 mg by mouth every 8 (eight) hours.   [DISCONTINUED] Multiple Vitamin (MULTIVITAMIN WITH MINERALS) TABS tablet Take 1 tablet by mouth daily.   [DISCONTINUED] nortriptyline (PAMELOR) 10 MG capsule TAKE THREE CAPSULES BY MOUTH EVERY NIGHT AT BEDTIME   [DISCONTINUED] rosuvastatin (CRESTOR) 40 MG  tablet Take 40 mg by mouth daily.     Allergies:   Patient has no known allergies.   Social History   Socioeconomic History   Marital status: Married    Spouse name: Not on file   Number of children: 2   Years of education: college   Highest education level: Not on file  Occupational History   Occupation: Health visitor  Tobacco Use   Smoking status: Former   Smokeless tobacco: Never  Advertising account planner   Vaping status: Never Used  Substance and Sexual Activity   Alcohol use: Yes   Drug use: No   Sexual activity: Not Currently  Other Topics Concern   Not on file  Social History Narrative   Patient lives with wife in a 2 story home.  Has 2 children.  Works as a Health visitor.  Education: college.       Right handed   Social Drivers of Health  Financial Resource Strain: Low Risk  (08/11/2023)   Received from [facility]   Overall Financial Resource Strain (CARDIA)    Difficulty of Paying Living Expenses: Not hard at all  Food Insecurity: No Food Insecurity (08/11/2023)   Received from [facility]   Hunger Vital Sign    Worried About Running Out of Food in the Last Year: Never true    Ran Out of Food in the Last Year: Never true  Transportation Needs: No Transportation Needs (08/11/2023)   Received from [facility] - Transportation    In the past 12 months, has lack of transportation kept you from medical appointments or from getting medications?: No    Lack of Transportation (Non-Medical): No  Physical Activity: Not on file  Stress: Not on file  Social Connections: Not on file    Family History: The patient's family history includes Glaucoma in his father; Stroke in his father.  ROS:   Please see the history of present illness.     All other systems reviewed and are negative.  EKGs/Labs/Other Studies Reviewed:    The following studies were reviewed today:  EKG  Interpretation Date/Time:  Monday August 23 2023 15:24:10 EST Ventricular Rate:  59 PR Interval:  184 QRS Duration:  96 QT Interval:  538 QTC Calculation: 532 R Axis:   69  Text Interpretation: Sinus bradycardia Incomplete right bundle branch block Nonspecific T wave abnormality Prolonged QT When compared with ECG of 21-May-2023 06:46, Incomplete right bundle branch block is now Present Confirmed by Georgie Chard ([id number]) on 08/23/2023 3:51:08 PM    Recent Labs: 09/28/2022: NT-Pro BNP 541 08/23/2023: BUN 82; Creatinine, Ser 4.24; Hemoglobin 8.8; Platelets 234; Potassium 4.2; Sodium 134  Recent Lipid Panel No results found for: "CHOL", "TRIG", "HDL", "CHOLHDL", "VLDL", "LDLCALC", "LDLDIRECT" Risk Assessment/Calculations:    CHA2DS2-VASc Score =   4  This indicates a  % annual risk of stroke. The patient's score is based upon: CHF History: 1 HTN History: 1 Diabetes History: 1 Vascular Disease History: 1 Age Score: 2 Gender Score: 0   Physical Exam:    VS:  BP 130/64   Pulse (!) 59   Ht 5\' 8"  (1.727 m)   Wt 208 lb 3.2 oz (94.4 kg)   SpO2 95%   BMI 31.66 kg/m     Wt Readings from Last 3 Encounters:  08/23/23 208 lb 3.2 oz (94.4 kg)  06/14/23 185 lb 9.6 oz (84.2 kg)  05/24/23 185 lb (83.9 kg)    General: Well developed, well nourished, NAD Lungs: CTA, diminished bilaterally. No wheezes, rales, or rhonchi. Breathing is unlabored. Cardiovascular: RRR with S1 S2. No murmurs Abdomen: Soft, non-tender, non-distended. No obvious abdominal masses. Extremities: Moderate non-pitting BLE edema.  Neuro: Alert and oriented. No focal deficits. No facial asymmetry. MAE spontaneously. Psych: Responds to questions appropriately with normal affect.    ASSESSMENT/PLAN:    CAD s/p CABG/AVR: Initially referred to structural heart and TCTS however was turned down for CABG.AVR and PCI/TAVR. He went for second opinion at [facility] and is now s/p CABG/AVR with Dr. Kizzie Bane. Many antihypertensive held  given issues with hypotension. Continue to hold for now and will add back slowly as he is further out from surgery. Will add ASA 81mg  daily. Continue Crestor, metoprolol. Has follow up with Dr. Kizzie Bane next week and Dr. Eden Emms 2/14.  Post op atrial fibrillation/ventricular ectopy: Son (MD) reports brief duration therefore DOAC deferred. Treated with amiodarone 400mg   BID, then 400mg  daily. Mexiletine added given issues with ventricular ectopy. Dicussed case with Dr. Ladona Ridgel who is in the office today with recommendations to continue Amiodarone 400mg  BID until 2/2 then reduce to 400mg  daily until seen by EP. Also recommended reducing Mexiletine from Q8H to BID until 2/2 then stop to avoid toxicity given poor renal function. Currently wearing a 2 week monitor placed at [facility] discharge. Continue and follow results. EP referral made today to our team. EKG with SB today.   CKD stage IV complicated by post-op AKI: Peak Cr during hospital course >5 with a discharge Cr 1/25 at 4.7. Follows with nephrology, Dr. Burgess Estelle with plans to schedule follow up. Will also reach to their team on recommendations for adding diuretics as he is having moderate LE edema. Renal panel today.   Urinary retention: Requiring urinary catheter however removed after able to void. No further issues and son reports no follow up required.   HTN: Many antihypertensives held due to profound orthostatic hypotension post op. Continue metoprolol 12.5mg  BID for now and add back slowly. BP stable today at 2130/64.   DM2: Continue insulin, jardiance. Follows with Dr. Nehemiah Settle this week.   Medication Adjustments/Labs and Tests Ordered: Current medicines are reviewed at length with the patient today.  Concerns regarding medicines are outlined above.  Orders Placed This Encounter  Procedures   Renal Function Panel   CBC   Ambulatory referral to Cardiac Electrophysiology   EKG 12-Lead   Meds ordered this encounter  Medications   mexiletine  (MEXITIL) 200 MG capsule    Sig: TAKE 1 TABLET TWICE A DAY UNTIL 08/29/23 THEN YOU CAN STOP IT   amiodarone (PACERONE) 400 MG tablet    Sig: TAKE 1 TABLET BY MOUTH TWICE A DAY UNTIL 08/29/23, THEN REDUCE TO 1 TABLET BY MOUTH DAILY    Patient Instructions  Medication Instructions:  Your physician has recommended you make the following change in your medication:   REDUCE the Mexiletine to 200 mg taking 1 twice a day until Sunday, 08/29/23, then you can stop it  TAKE Amiodarone 400 mg twice a day until Sunday, 08/29/23, then reduce it to 400 mg taking 1 daily   *If you need a refill on your cardiac medications before your next appointment, please call your pharmacy*   Lab Work: TODAY:  RENAL PANEL & CBC  If you have labs (blood work) drawn today and your tests are completely normal, you will receive your results only by: MyChart Message (if you have MyChart) OR A paper copy in the mail If you have any lab test that is abnormal or we need to change your treatment, we will call you to review the results.   Testing/Procedures: None ordered   You have been referred to EP for Ventricular Ectopy  Follow-Up: At [facility], you and your health needs are our priority.  As part of our continuing mission to provide you with exceptional heart care, we have created designated Provider Care Teams.  These Care Teams include your primary Cardiologist (physician) and Advanced Practice Providers (APPs -  Physician Assistants and Nurse Practitioners) who all work together to provide you with the care you need, when you need it.  We recommend signing up for the patient portal called "MyChart".  Sign up information is provided on this After Visit Summary.  MyChart is used to connect with patients for Virtual Visits (Telemedicine).  Patients are able to view lab/test results, encounter notes, upcoming appointments, etc.  Non-urgent  messages can be sent to your provider as well.   To learn more about what  you can do with MyChart, go to [email or web address].    Your next appointment:   As scheduled  Provider:   Charlton Haws, MD     Other Instructions   1st Floor: - Lobby - Registration  - Pharmacy  - Lab - Cafe  2nd Floor: - PV Lab - Diagnostic Testing (echo, CT, nuclear med)  3rd Floor: - Vacant  4th Floor: - TCTS (cardiothoracic surgery) - AFib Clinic - Structural Heart Clinic - Vascular Surgery  - Vascular Ultrasound  5th Floor: - HeartCare Cardiology (general and EP) - Clinical Pharmacy for coumadin, hypertension, lipid, weight-loss medications, and med management appointments    Valet parking services will be available as well.           Signed, Georgie Chard, NP  08/24/2023 9:12 AM    [facility]

## 2023-08-24 ENCOUNTER — Other Ambulatory Visit: Payer: Self-pay | Admitting: Cardiology

## 2023-08-24 LAB — CBC
Hematocrit: 26.9 % — ABNORMAL LOW (ref 37.5–51.0)
Hemoglobin: 8.8 g/dL — ABNORMAL LOW (ref 13.0–17.7)
MCH: 30.6 pg (ref 26.6–33.0)
MCHC: 32.7 g/dL (ref 31.5–35.7)
MCV: 93 fL (ref 79–97)
Platelets: 234 10*3/uL (ref 150–450)
RBC: 2.88 x10E6/uL — ABNORMAL LOW (ref 4.14–5.80)
RDW: 13.5 % (ref 11.6–15.4)
WBC: 13.2 10*3/uL — ABNORMAL HIGH (ref 3.4–10.8)

## 2023-08-24 LAB — RENAL FUNCTION PANEL
Albumin: 3.3 g/dL — ABNORMAL LOW (ref 3.8–4.8)
BUN/Creatinine Ratio: 19 (ref 10–24)
BUN: 82 mg/dL (ref 8–27)
CO2: 19 mmol/L — ABNORMAL LOW (ref 20–29)
Calcium: 8.7 mg/dL (ref 8.6–10.2)
Chloride: 99 mmol/L (ref 96–106)
Creatinine, Ser: 4.24 mg/dL — ABNORMAL HIGH (ref 0.76–1.27)
Glucose: 132 mg/dL — ABNORMAL HIGH (ref 70–99)
Phosphorus: 4.3 mg/dL — ABNORMAL HIGH (ref 2.8–4.1)
Potassium: 4.2 mmol/L (ref 3.5–5.2)
Sodium: 134 mmol/L (ref 134–144)
eGFR: 14 mL/min/{1.73_m2} — ABNORMAL LOW (ref >59)

## 2023-08-24 MED ORDER — FUROSEMIDE 20 MG PO TABS: 20.0000 mg | ORAL_TABLET | Freq: Every day | ORAL | 11 refills | Status: DC

## 2023-08-30 ENCOUNTER — Ambulatory Visit: Payer: PPO | Admitting: Neurology

## 2023-08-30 DIAGNOSIS — M6281 Muscle weakness (generalized): Secondary | ICD-10-CM | POA: Diagnosis not present

## 2023-08-30 DIAGNOSIS — R2681 Unsteadiness on feet: Secondary | ICD-10-CM | POA: Diagnosis not present

## 2023-08-30 DIAGNOSIS — R5381 Other malaise: Secondary | ICD-10-CM | POA: Diagnosis not present

## 2023-08-30 DIAGNOSIS — Z951 Presence of aortocoronary bypass graft: Secondary | ICD-10-CM | POA: Diagnosis not present

## 2023-08-31 ENCOUNTER — Encounter (INDEPENDENT_AMBULATORY_CARE_PROVIDER_SITE_OTHER): Payer: PPO | Admitting: Ophthalmology

## 2023-08-31 DIAGNOSIS — H35012 Changes in retinal vascular appearance, left eye: Secondary | ICD-10-CM

## 2023-08-31 DIAGNOSIS — Z79899 Other long term (current) drug therapy: Secondary | ICD-10-CM | POA: Diagnosis not present

## 2023-08-31 DIAGNOSIS — I350 Nonrheumatic aortic (valve) stenosis: Secondary | ICD-10-CM | POA: Diagnosis not present

## 2023-08-31 DIAGNOSIS — I10 Essential (primary) hypertension: Secondary | ICD-10-CM

## 2023-08-31 DIAGNOSIS — Z952 Presence of prosthetic heart valve: Secondary | ICD-10-CM | POA: Diagnosis not present

## 2023-08-31 DIAGNOSIS — E113313 Type 2 diabetes mellitus with moderate nonproliferative diabetic retinopathy with macular edema, bilateral: Secondary | ICD-10-CM

## 2023-08-31 DIAGNOSIS — J90 Pleural effusion, not elsewhere classified: Secondary | ICD-10-CM | POA: Diagnosis not present

## 2023-08-31 DIAGNOSIS — R918 Other nonspecific abnormal finding of lung field: Secondary | ICD-10-CM | POA: Diagnosis not present

## 2023-08-31 DIAGNOSIS — H25813 Combined forms of age-related cataract, bilateral: Secondary | ICD-10-CM

## 2023-08-31 DIAGNOSIS — R9431 Abnormal electrocardiogram [ECG] [EKG]: Secondary | ICD-10-CM | POA: Diagnosis not present

## 2023-08-31 DIAGNOSIS — N2889 Other specified disorders of kidney and ureter: Secondary | ICD-10-CM | POA: Diagnosis not present

## 2023-08-31 DIAGNOSIS — Z48812 Encounter for surgical aftercare following surgery on the circulatory system: Secondary | ICD-10-CM | POA: Diagnosis not present

## 2023-08-31 DIAGNOSIS — Z951 Presence of aortocoronary bypass graft: Secondary | ICD-10-CM | POA: Diagnosis not present

## 2023-08-31 DIAGNOSIS — I2510 Atherosclerotic heart disease of native coronary artery without angina pectoris: Secondary | ICD-10-CM | POA: Diagnosis not present

## 2023-08-31 DIAGNOSIS — H35033 Hypertensive retinopathy, bilateral: Secondary | ICD-10-CM

## 2023-09-01 DIAGNOSIS — I350 Nonrheumatic aortic (valve) stenosis: Secondary | ICD-10-CM | POA: Diagnosis not present

## 2023-09-01 DIAGNOSIS — I25810 Atherosclerosis of coronary artery bypass graft(s) without angina pectoris: Secondary | ICD-10-CM | POA: Diagnosis not present

## 2023-09-01 DIAGNOSIS — M6281 Muscle weakness (generalized): Secondary | ICD-10-CM | POA: Diagnosis not present

## 2023-09-02 NOTE — Progress Notes (Signed)
Cardiology Office Note:    Date:  09/10/2023   ID:  Chad [name], DOB May 17, 1946, MRN [id number]  PCP:  Renford Dills, MD   [facility] Providers Cardiologist:  Charlton Haws, MD     Referring MD: Renford Dills, MD   No chief complaint on file.  History of Present Illness:    Chad [name] is a 78 y.o. male with a hx of HTN, HLD, CKD IV (baseline Cr 1.8), and DM2 who was recently found to have severe AS and 3V CAD who presents for post-CABG/AVR (Dr. Kizzie Bane at [facility]) follow up.   Chad [name] is followed by Dr. Eden Emms for his cardiology care and was referred to Dr. Lynnette Caffey for possible TAVR given new, symptomatic severe aortic stenosis. Unfortunately pre TAVR cardiac cath showed significant CAD with LM equivalent. I felt that CABG/AVR would be more suitable than TAVR. However CRF and risk of dialysis post pump run raised concerns Ultimately he ended up having CABG/AVR at [facility]  Surgical details 25 mm Perceval rapid deployment bovine pericardial valve and CABG with LIMA to LAD, SVG OM Hospitalized for 11 days due to ARF/CRF PAF and urinary retention   Course was complicated by significant AKI with a peak Cr >5.0, urinary retention requiring catheter placement followed by urology, and new post-op atrial fibrillation with significant ventricular ectopy treated with amiodarone and mexiletine with plans for EP referral. Not anticoagulated.   Son is a GI physician in [place]. Patient sees Dr Melanee Spry from [facility]. Cr improved to 2.8 with K 3.5 and BUN 43 on 08/31/23  Urinating a lot. Sternum healing Has referral to cardiac rehab at [facility]  Past Medical History:  Diagnosis Date   Cataract    CKD (chronic kidney disease)    Colon polyps    Diabetes (HCC)    Diabetic polyneuropathy associated with type 2 diabetes mellitus (HCC) 01/15/2017   Diabetic retinopathy (HCC)    GERD (gastroesophageal reflux disease)    Hypercholesterolemia    Hypertension    Hypertensive  retinopathy    Hypothyroidism    Polyneuropathy    Proteinuria    Severe aortic stenosis     Past Surgical History:  Procedure Laterality Date   RIGHT/LEFT HEART CATH AND CORONARY ANGIOGRAPHY N/A 05/21/2023   Procedure: RIGHT/LEFT HEART CATH AND CORONARY ANGIOGRAPHY;  Surgeon: Orbie Pyo, MD;  [facility]: [facility] INV[facility] CV LAB;  Service: Cardiovascular;  Laterality: N/A;    Current Medications: Current Meds  Medication Sig   amiodarone (PACERONE) 400 MG tablet TAKE 1 TABLET BY MOUTH TWICE A DAY UNTIL 08/29/23, THEN REDUCE TO 1 TABLET BY MOUTH DAILY   amLODipine (NORVASC) 5 MG tablet Take 5 mg by mouth daily.   aspirin 81 MG chewable tablet Chew 81 mg by mouth daily.   B-D ULTRAFINE III SHORT PEN 31G X 8 MM MISC SMARTSIG:1 Each SUB-Q Daily   insulin glargine (LANTUS) 100 UNIT/ML injection Inject 15 Units into the skin daily.   JARDIANCE 10 MG TABS tablet Take 10 mg by mouth daily.   levothyroxine (SYNTHROID, LEVOTHROID) 75 MCG tablet Take 75 mcg by mouth daily before breakfast.    metoprolol tartrate (LOPRESSOR) 25 MG tablet Take 12.5 mg by mouth 2 (two) times daily.   ONETOUCH VERIO test strip    polyethylene glycol (MIRALAX / GLYCOLAX) 17 g packet Take 17 g by mouth every other day.   rosuvastatin (CRESTOR) 20 MG tablet Take 20 mg by mouth daily.     Allergies:  Patient has no known allergies.   Social History   Socioeconomic History   Marital status: Married    Spouse name: Not on file   Number of children: 2   Years of education: college   Highest education level: Not on file  Occupational History   Occupation: Health visitor  Tobacco Use   Smoking status: Former   Smokeless tobacco: Never  Advertising account planner   Vaping status: Never Used  Substance and Sexual Activity   Alcohol use: Yes   Drug use: No   Sexual activity: Not Currently  Other Topics Concern   Not on file  Social History Narrative   Patient lives with wife in a 2 story home.  Has 2 children.   Works as a Health visitor.  Education: college.       Right handed   Social Drivers of Health   Financial Resource Strain: Low Risk  (08/11/2023)   Received from [facility]   Overall Financial Resource Strain (CARDIA)    Difficulty of Paying Living Expenses: Not hard at all  Food Insecurity: No Food Insecurity (08/11/2023)   Received from [facility]   Hunger Vital Sign    Worried About Running Out of Food in the Last Year: Never true    Ran Out of Food in the Last Year: Never true  Transportation Needs: No Transportation Needs (08/11/2023)   Received from [facility] - Transportation    In the past 12 months, has lack of transportation kept you from medical appointments or from getting medications?: No    Lack of Transportation (Non-Medical): No  Physical Activity: Not on file  Stress: Not on file  Social Connections: Not on file    Family History: The patient's family history includes Glaucoma in his father; Stroke in his father.  ROS:   Please see the history of present illness.     All other systems reviewed and are negative.  EKGs/Labs/Other Studies Reviewed:    The following studies were reviewed today:       Recent Labs: 09/28/2022: NT-Pro BNP 541 08/23/2023: BUN 82; Creatinine, Ser 4.24; Hemoglobin 8.8; Platelets 234; Potassium 4.2; Sodium 134  Recent Lipid Panel No results found for: "CHOL", "TRIG", "HDL", "CHOLHDL", "VLDL", "LDLCALC", "LDLDIRECT" Risk Assessment/Calculations:    CHA2DS2-VASc Score =   4  This indicates a  % annual risk of stroke. The patient's score is based upon: CHF History: 1 HTN History: 1 Diabetes History: 1 Vascular Disease History: 1 Age Score: 2 Gender Score: 0   Physical Exam:    VS:  BP (!) 162/70   Pulse 60   Ht 5\' 8"  (1.727 m)   Wt 179 lb (81.2 kg)   SpO2 98%   BMI 27.22 kg/m     Wt Readings from Last 3 Encounters:  09/10/23 179 lb (81.2 kg)   08/23/23 208 lb 3.2 oz (94.4 kg)  06/14/23 185 lb 9.6 oz (84.2 kg)    Elderly male Post sternotomy SEM through AVR Abdomen benign Lungs clear Plus one LE edema  ASSESSMENT/PLAN:    CAD s/p CABG/AVR: 25 mm Percael rapid deployment valve and CABG with LIMA to LAD and SVG OM. Will order TTE to assess baseline gradients and EF continue ASA, lopressor Has had some orthostasis so no further Rx   Post op atrial fibrillation/ventricular ectopy: Dr Ladona Ridgel suggested weaning amiodarone and d/c mexiletine due to renal issues Had monitor  Placed at [facility]  In NSR and not anticoagulated due to brief post op nature of PAF Will be off both by end of month Discussed taking pulse and using pulse oximeter to check for recurrence as well   CKD stage IV complicated by post-op AKI: Peak Cr during hospital course >5 with a discharge Cr 1/25 at 4.7. Most recent 2.8 with baseline prior to CABG/AVR around 2.0  F/U Dr [facility]  Urinary retention: Requiring urinary catheter however removed after able to void. No further issues and son reports no follow up required.   HTN:  post op orthostatism only on beta blocker now    DM2: Continue insulin, jardiance. Follows with Dr. Nehemiah Settle this week.    Time spent reviewing 11 day hospital stay at [facility] with operative note , labs, xrays and surgical notes alone 30 minutes   F/U EP F/U Nephrology TTE baseline AVR gradients  F/U with me 3 months   Signed, Charlton Haws, MD  09/10/2023 3:52 PM    [facility]

## 2023-09-02 NOTE — Progress Notes (Signed)
 [facility] - Clinic Note  09/14/2023    CHIEF COMPLAINT Patient presents for Retina Follow Up  HISTORY OF PRESENT ILLNESS: Chad [name] is a 78 y.o. male who presents to the clinic today for:   HPI     Retina Follow Up   Patient presents with  Diabetic Retinopathy.  In both eyes.  This started 12 weeks ago.  I, the attending physician,  performed the HPI with the patient and updated documentation appropriately.        Comments   Patient here for 12 weeks retina follow up fro NPDR OU. Patient states visio seems to be ok. No eye pain. Had open heart surgery.       Last edited by Rennis Chris, MD on 09/14/2023  5:26 PM.    Pt is delayed to follow up from 12 weeks to 14 weeks due to having open heart sx on January 14th, he had an aortic valve replaced and 2 bypasses  Referring physician: Renford Dills, MD [address] [place],  [place] [address]  HISTORICAL INFORMATION:   Selected notes from the MEDICAL RECORD NUMBER Referred by Dr. Emily Filbert for diabetic retinopathy with DME OU [name]:  Ocular Hx- PMH-    CURRENT MEDICATIONS: No current outpatient medications on file. (Ophthalmic Drugs)   No current facility-administered medications for this visit. (Ophthalmic Drugs)   Current Outpatient Medications (Other)  Medication Sig   amiodarone (PACERONE) 400 MG tablet TAKE 1 TABLET BY MOUTH TWICE A DAY UNTIL 08/29/23, THEN REDUCE TO 1 TABLET BY MOUTH DAILY   amLODipine (NORVASC) 10 MG tablet Take 1 tablet (10 mg total) by mouth daily.   aspirin 81 MG chewable tablet Chew 81 mg by mouth daily.   B-D ULTRAFINE III SHORT PEN 31G X 8 MM MISC SMARTSIG:1 Each SUB-Q Daily   insulin glargine (LANTUS) 100 UNIT/ML injection Inject 15 Units into the skin daily.   JARDIANCE 10 MG TABS tablet Take 10 mg by mouth daily.   levothyroxine (SYNTHROID, LEVOTHROID) 75 MCG tablet Take 75 mcg by mouth daily before breakfast.    metoprolol tartrate (LOPRESSOR) 25 MG  tablet Take 12.5 mg by mouth 2 (two) times daily.   ONETOUCH VERIO test strip    polyethylene glycol (MIRALAX / GLYCOLAX) 17 g packet Take 17 g by mouth every other day.   rosuvastatin (CRESTOR) 20 MG tablet Take 20 mg by mouth daily.   furosemide (LASIX) 20 MG tablet Take 1 tablet (20 mg total) by mouth daily. (Patient not taking: Reported on 09/14/2023)   insulin lispro (HUMALOG) 100 UNIT/ML injection Inject 15 Units into the skin daily. (Patient not taking: Reported on 09/14/2023)   mexiletine (MEXITIL) 200 MG capsule TAKE 1 TABLET TWICE A DAY UNTIL 08/29/23 THEN YOU CAN STOP IT (Patient not taking: Reported on 09/14/2023)   [DISCONTINUED] mexiletine (MEXITIL) 200 MG capsule Take 200 mg by mouth every 8 (eight) hours. (Patient not taking: Reported on 09/14/2023)   No current facility-administered medications for this visit. (Other)   REVIEW OF SYSTEMS: ROS   Positive for: Endocrine, Cardiovascular, Eyes Negative for: Constitutional, Gastrointestinal, Neurological, Skin, Genitourinary, Musculoskeletal, HENT, Respiratory, Psychiatric, Allergic/Imm, Heme/Lymph Last edited by Laddie Aquas, COA on 09/14/2023  2:21 PM.      ALLERGIES No Known Allergies  PAST MEDICAL HISTORY Past Medical History:  Diagnosis Date   Cataract    CKD (chronic kidney disease)    Colon polyps    Diabetes (HCC)  Diabetic polyneuropathy associated with type 2 diabetes mellitus (HCC) 01/15/2017   Diabetic retinopathy (HCC)    GERD (gastroesophageal reflux disease)    Hypercholesterolemia    Hypertension    Hypertensive retinopathy    Hypothyroidism    Polyneuropathy    Proteinuria    Severe aortic stenosis    Past Surgical History:  Procedure Laterality Date   RIGHT/LEFT HEART CATH AND CORONARY ANGIOGRAPHY N/A 05/21/2023   Procedure: RIGHT/LEFT HEART CATH AND CORONARY ANGIOGRAPHY;  Surgeon: Orbie Pyo, MD;  Location: [facility];  Service: Cardiovascular;  Laterality: N/A;    FAMILY  HISTORY Family History  Problem Relation Age of Onset   Stroke Father    Glaucoma Father    SOCIAL HISTORY Social History   Tobacco Use   Smoking status: Former   Smokeless tobacco: Never  Advertising account planner   Vaping status: Never Used  Substance Use Topics   Alcohol use: Yes   Drug use: No       OPHTHALMIC EXAM: Base Eye Exam     Visual Acuity (Snellen - Linear)       Right Left   Dist cc 20/40 -2 20/60 -2   Dist ph cc 20/30 +1 20/25 -1         Tonometry (Tonopen, 2:19 PM)       Right Left   Pressure 15 15         Pupils       Dark Light Shape React APD   Right 3 2 Round Brisk None   Left 3 2 Round Brisk None         Visual Fields (Counting fingers)       Left Right    Full Full         Extraocular Movement       Right Left    Full, Ortho Full, Ortho         Neuro/Psych     Oriented x3: Yes   Mood/Affect: Normal         Dilation     Both eyes: 1.0% Mydriacyl, 2.5% Phenylephrine @ 2:19 PM           Slit Lamp and Fundus Exam     External Exam       Right Left   External Normal Normal         Slit Lamp Exam       Right Left   Lids/Lashes Dermatochalasis - upper lid, mild Telangiectasia Dermatochalasis - upper lid, Meibomian gland dysfunction   Conjunctiva/Sclera White and quiet White and quiet   Cornea 2+ fine, inferior Punctate epithelial erosions 1+ Punctate epithelial erosions, mild tear film debris   Anterior Chamber deep, clear, narrow angles deep, clear, narrow angles   Iris Round and dilated, mild anterior bowing, No NVI Round and dilated, mild anterior bowing, No NVI   Lens 2-3+ Nuclear sclerosis, 2+ Cortical cataract 2-3+ Nuclear sclerosis, 2+ Cortical cataract   Anterior Vitreous Vitreous syneresis Vitreous syneresis         Fundus Exam       Right Left   Disc Pink and Sharp, no NVD Pink and Sharp, no NVD   C/D Ratio 0.3 0.3   Macula good foveal reflex, mild ERM, minimal MA and exudates, central cystic  changes -- stably improved Flat, good foveal reflex, minimal MA and focal exudates, trace residual cystic changes superior mac   Vessels attenuated, Tortuous attenuated, Tortuous, Copper wiring, Macroaneurysm / CWS along IT arcades -  improving   Periphery Attached, scattered MA and CWS greatest posteriorly Attached, scattered MA/DBH greatest posteriorly, focal CWS inferior to IT arcades--fading           IMAGING AND PROCEDURES  Imaging and Procedures for 09/14/2023  OCT, Retina - OU - Both Eyes       Right Eye Quality was good. Central Foveal Thickness: 277. Progression has been stable. Findings include normal foveal contour, no IRF, no SRF, intraretinal hyper-reflective material, epiretinal membrane, macular pucker (Stable improvement in IRF / IRHM temporal macula and fovea ).   Left Eye Quality was good. Central Foveal Thickness: 250. Progression has been stable. Findings include normal foveal contour, no SRF, intraretinal hyper-reflective material, intraretinal fluid (Stable improvement IRF/IRHM centrally, trace persistent cystic changes sup mac ).   Notes *Images captured and stored on drive  Diagnosis / Impression:  +DME OU OD: Stable improvement in IRF / IRHM temporal macula and fovea  OS: Stable improvement IRF/IRHM centrally, trace persistent cystic changes sup mac   Clinical management:  See below  Abbreviations: NFP - Normal foveal profile. CME - cystoid macular edema. PED - pigment epithelial detachment. IRF - intraretinal fluid. SRF - subretinal fluid. EZ - ellipsoid zone. ERM - epiretinal membrane. ORA - outer retinal atrophy. ORT - outer retinal tubulation. SRHM - subretinal hyper-reflective material. IRHM - intraretinal hyper-reflective material      Intravitreal Injection, Pharmacologic Agent - OS - Left Eye       Time Out 09/14/2023. 3:31 PM. Confirmed correct patient, procedure, site, and patient consented.   Anesthesia Topical anesthesia was used.  Anesthetic medications included Lidocaine 2%, Proparacaine 0.5%.   Procedure Preparation included 5% betadine to ocular surface, eyelid speculum. A (32g) needle was used.   Injection: 1.25 mg Bevacizumab 1.25mg /0.52ml   Route: Intravitreal, Site: Left Eye   NDC: [id number], Lot: [id number], Expiration date: 10/14/2023   Post-op Post injection exam found visual acuity of at least counting fingers. The patient tolerated the procedure well. There were no complications. The patient received written and verbal post procedure care education. Post injection medications were not given.            ASSESSMENT/PLAN:    ICD-10-CM   1. Moderate nonproliferative diabetic retinopathy of both eyes with macular edema associated with type 2 diabetes mellitus (HCC)  E11.3313 OCT, Retina - OU - Both Eyes    Intravitreal Injection, Pharmacologic Agent - OS - Left Eye    Bevacizumab (AVASTIN) SOLN 1.25 mg    2. 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 Essential hypertension  I10     4. Hypertensive retinopathy of both eyes  H35.033     5. Combined forms of age-related cataract of both eyes  H25.813       1. Moderate Non-proliferative diabetic retinopathy w/ DME OU  - s/p IVA OD #1 (11.17.22) - s/p IVA OS #1 (11.14.22), #7 (05.12.23), #8 (06.09.23) - s/p IVA OU #2 (12.15.22), #3 (01.12.23), #4 (02.13.23), #5 (03.16.23), #6 (04.12.23),   **IVA resistance OU**  ============================================================ - s/p IVE OD #1 (05.12.23) -- sample, #2 (06.09.23), #3 (07.07.23), #4 (08.04.23), #5 (09.22.23), #6 (11.07.23), #7 (11.07.23), #8 (01.30.24), #9 (03.12.24), #10 (05.01.24), #11 (06.19.24), #12 (08.23.24), #13 (11.12.24) - s/p IVE OS #1 (07.07.23), #2 (08.04.23), #3 (09.22.23), #4 (11.07.23),#5 (11.07.23), #6 (01.30.24), #7 (03.12.24), #8 (05.01.24), #9 (06.19.24), #10 (08.23.24), #11 (11.12.24) - exam shows scattered MA/DBH OU, +edema and exudates - FA (11.14.22) shows  vascular perfusion defects OU, no  NV OU, OS with focal macroaneurysm along IT arcades - OCT shows OD: Stable improvement in IRF / IRHM temporal macula and fovea--no fluid; OS: Stable improvement IRF/IRHM centrally, trace persistent cystic changes sup mac at 14 weeks  - BCVA 20/30 OD, 20/25 OS - pt wishes to switch back to IVA OS #12 today, 02.18.25, due to no funding with [employer] -- f/u in 12 weeks again - ok to hold injxn OD -- pt in agreement - pt wishes to proceed with injection OS - RBA of procedure discussed, questions answered - IVA informed consent obtained and re-signed OU, 02.18.25 - see procedure note - f/u in 12 weeks, DFE, OCT, possible injection(s)  2. Retinal macroaneurysm OS  - focal lesion along IT arcades -- regressed  - s/p IVA OS as above  3,4. Hypertensive retinopathy OU - discussed importance of tight BP control - continue to monitor  5.  Mixed Cataract OU  - being monitored by Dr. Emily Filbert - The symptoms of cataract, surgical options, and treatments and risks were discussed with patient.  - discussed diagnosis and progression - approaching visual significance   Ophthalmic Meds Ordered this visit:  Meds ordered this encounter  Medications   Bevacizumab (AVASTIN) SOLN 1.25 mg     Return in about 12 weeks (around 12/07/2023) for f/u NPDR OU, DFE, OCT, Possible Injxn.  There are no Patient Instructions on file for this visit.   Explained the diagnoses, plan, and follow up with the patient and they expressed understanding.  Patient expressed understanding of the importance of proper follow up care.   This document serves as a record of services personally performed by Karie Chimera, MD, PhD. It was created on their behalf by Charlette Caffey, COT an ophthalmic technician. The creation of this record is the provider's dictation and/or activities during the visit.    Electronically signed by:  Charlette Caffey, COT  09/14/23 5:28 PM  This document serves  as a record of services personally performed by Karie Chimera, MD, PhD. It was created on their behalf by Glee Arvin. Manson Passey, OA an ophthalmic technician. The creation of this record is the provider's dictation and/or activities during the visit.    Electronically signed by: Glee Arvin. Manson Passey, OA 09/14/23 5:28 PM  Karie Chimera, M.D., Ph.D. Diseases & Surgery of the Retina and Vitreous [facility]  I have reviewed the above documentation for accuracy and completeness, and I agree with the above. Karie Chimera, M.D., Ph.D. 09/14/23 5:30 PM   Abbreviations: M myopia (nearsighted); A astigmatism; H hyperopia (farsighted); P presbyopia; Mrx spectacle prescription;  CTL contact lenses; OD right eye; OS left eye; OU both eyes  XT exotropia; ET esotropia; PEK punctate epithelial keratitis; PEE punctate epithelial erosions; DES dry eye syndrome; MGD meibomian gland dysfunction; ATs artificial tears; PFAT's preservative free artificial tears; NSC nuclear sclerotic cataract; PSC posterior subcapsular cataract; ERM epi-retinal membrane; PVD posterior vitreous detachment; RD retinal detachment; DM diabetes mellitus; DR diabetic retinopathy; NPDR non-proliferative diabetic retinopathy; PDR proliferative diabetic retinopathy; CSME clinically significant macular edema; DME diabetic macular edema; dbh dot blot hemorrhages; CWS cotton wool spot; POAG primary open angle glaucoma; C/D cup-to-disc ratio; HVF humphrey visual field; GVF goldmann visual field; OCT optical coherence tomography; IOP intraocular pressure; BRVO Branch retinal vein occlusion; CRVO central retinal vein occlusion; CRAO central retinal artery occlusion; BRAO branch retinal artery occlusion; RT retinal tear; SB scleral buckle; PPV pars plana vitrectomy; VH Vitreous hemorrhage; PRP  panretinal laser photocoagulation; IVK intravitreal kenalog; VMT vitreomacular traction; MH Macular hole;  NVD neovascularization of the disc; NVE  neovascularization elsewhere; AREDS age related eye disease study; ARMD age related macular degeneration; POAG primary open angle glaucoma; EBMD epithelial/anterior basement membrane dystrophy; ACIOL anterior chamber intraocular lens; IOL intraocular lens; PCIOL posterior chamber intraocular lens; Phaco/IOL phacoemulsification with intraocular lens placement; PRK photorefractive keratectomy; LASIK laser assisted in situ keratomileusis; HTN hypertension; DM diabetes mellitus; COPD chronic obstructive pulmonary disease

## 2023-09-03 ENCOUNTER — Telehealth: Payer: Self-pay

## 2023-09-03 DIAGNOSIS — I25810 Atherosclerosis of coronary artery bypass graft(s) without angina pectoris: Secondary | ICD-10-CM | POA: Diagnosis not present

## 2023-09-03 DIAGNOSIS — I350 Nonrheumatic aortic (valve) stenosis: Secondary | ICD-10-CM | POA: Diagnosis not present

## 2023-09-03 DIAGNOSIS — M6281 Muscle weakness (generalized): Secondary | ICD-10-CM | POA: Diagnosis not present

## 2023-09-03 NOTE — Telephone Encounter (Signed)
-----   Message from Janelle Mediate sent at 09/02/2023 12:43 PM EST ----- Can we get monitor results from [facility] ? Has it been read Also is he seeing EP with us  or at [facility]

## 2023-09-03 NOTE — Telephone Encounter (Signed)
 Left second message for patient to call back. Sent patient mychart message.

## 2023-09-03 NOTE — Telephone Encounter (Signed)
 Patient is returning call.

## 2023-09-03 NOTE — Telephone Encounter (Signed)
 Left message for patient to call back

## 2023-09-06 DIAGNOSIS — R5381 Other malaise: Secondary | ICD-10-CM | POA: Diagnosis not present

## 2023-09-06 DIAGNOSIS — M6281 Muscle weakness (generalized): Secondary | ICD-10-CM | POA: Diagnosis not present

## 2023-09-06 DIAGNOSIS — R2681 Unsteadiness on feet: Secondary | ICD-10-CM | POA: Diagnosis not present

## 2023-09-06 DIAGNOSIS — Z951 Presence of aortocoronary bypass graft: Secondary | ICD-10-CM | POA: Diagnosis not present

## 2023-09-07 ENCOUNTER — Other Ambulatory Visit: Payer: Self-pay | Admitting: Neurology

## 2023-09-07 DIAGNOSIS — R5381 Other malaise: Secondary | ICD-10-CM | POA: Diagnosis not present

## 2023-09-07 DIAGNOSIS — M6281 Muscle weakness (generalized): Secondary | ICD-10-CM | POA: Diagnosis not present

## 2023-09-07 DIAGNOSIS — Z951 Presence of aortocoronary bypass graft: Secondary | ICD-10-CM | POA: Diagnosis not present

## 2023-09-07 DIAGNOSIS — R2681 Unsteadiness on feet: Secondary | ICD-10-CM | POA: Diagnosis not present

## 2023-09-08 DIAGNOSIS — I25810 Atherosclerosis of coronary artery bypass graft(s) without angina pectoris: Secondary | ICD-10-CM | POA: Diagnosis not present

## 2023-09-08 DIAGNOSIS — M6281 Muscle weakness (generalized): Secondary | ICD-10-CM | POA: Diagnosis not present

## 2023-09-08 DIAGNOSIS — I350 Nonrheumatic aortic (valve) stenosis: Secondary | ICD-10-CM | POA: Diagnosis not present

## 2023-09-09 DIAGNOSIS — N184 Chronic kidney disease, stage 4 (severe): Secondary | ICD-10-CM | POA: Diagnosis not present

## 2023-09-09 LAB — LAB REPORT - SCANNED
Albumin, Urine POC: 677
Calcium: 8.9
Creatinine, POC: 65.1 mg/dL
EGFR: 21
Microalb Creat Ratio: 1040
PTH: 57

## 2023-09-10 ENCOUNTER — Encounter: Payer: Self-pay | Admitting: Cardiovascular Disease

## 2023-09-10 ENCOUNTER — Ambulatory Visit: Payer: PPO | Attending: Cardiovascular Disease | Admitting: Cardiovascular Disease

## 2023-09-10 VITALS — BP 162/70 | HR 60 | Ht 68.0 in | Wt 179.0 lb

## 2023-09-10 DIAGNOSIS — Z951 Presence of aortocoronary bypass graft: Secondary | ICD-10-CM

## 2023-09-10 DIAGNOSIS — I350 Nonrheumatic aortic (valve) stenosis: Secondary | ICD-10-CM

## 2023-09-10 DIAGNOSIS — E782 Mixed hyperlipidemia: Secondary | ICD-10-CM | POA: Diagnosis not present

## 2023-09-10 DIAGNOSIS — N184 Chronic kidney disease, stage 4 (severe): Secondary | ICD-10-CM | POA: Diagnosis not present

## 2023-09-10 DIAGNOSIS — Z952 Presence of prosthetic heart valve: Secondary | ICD-10-CM

## 2023-09-10 MED ORDER — AMLODIPINE BESYLATE 10 MG PO TABS: 10.0000 mg | ORAL_TABLET | Freq: Every day | ORAL | 3 refills | Status: DC

## 2023-09-10 NOTE — Patient Instructions (Addendum)
Medication Instructions:  Your physician has recommended you make the following change in your medication:  1- INCREASE Norvasc (amlodipine) 10 mg by mouth daily.  *If you need a refill on your cardiac medications before your next appointment, please call your pharmacy*  Lab Work: If you have labs (blood work) drawn today and your tests are completely normal, you will receive your results only by: MyChart Message (if you have MyChart) OR A paper copy in the mail If you have any lab test that is abnormal or we need to change your treatment, we will call you to review the results.  Testing/Procedures: Your physician has requested that you have an echocardiogram in February. Echocardiography is a painless test that uses sound waves to create images of your heart. It provides your doctor with information about the size and shape of your heart and how well your heart's chambers and valves are working. This procedure takes approximately one hour. There are no restrictions for this procedure. Please do NOT wear cologne, perfume, aftershave, or lotions (deodorant is allowed). Please arrive 15 minutes prior to your appointment time.  Please note: We ask at that you not bring children with you during ultrasound (echo/ vascular) testing. Due to room size and safety concerns, children are not allowed in the ultrasound rooms during exams. Our front office staff cannot provide observation of children in our lobby area while testing is being conducted. An adult accompanying a patient to their appointment will only be allowed in the ultrasound room at the discretion of the ultrasound technician under special circumstances. We apologize for any inconvenience. Follow-Up: At [facility], you and your health needs are our priority.  As part of our continuing mission to provide you with exceptional heart care, we have created designated Provider Care Teams.  These Care Teams include your primary Cardiologist  (physician) and Advanced Practice Providers (APPs -  Physician Assistants and Nurse Practitioners) who all work together to provide you with the care you need, when you need it.  We recommend signing up for the patient portal called "MyChart".  Sign up information is provided on this After Visit Summary.  MyChart is used to connect with patients for Virtual Visits (Telemedicine).  Patients are able to view lab/test results, encounter notes, upcoming appointments, etc.  Non-urgent messages can be sent to your provider as well.   To learn more about what you can do with MyChart, go to [email or web address].    Your next appointment:   3 month(s)  Provider:   Charlton Haws, MD     Other Instructions    1st Floor: - Lobby - Registration  - Pharmacy  - Lab - Cafe  2nd Floor: - PV Lab - Diagnostic Testing (echo, CT, nuclear med)  3rd Floor: - Vacant  4th Floor: - TCTS (cardiothoracic surgery) - AFib Clinic - Structural Heart Clinic - Vascular Surgery  - Vascular Ultrasound  5th Floor: - [facility] (general and EP) - Clinical Pharmacy for coumadin, hypertension, lipid, weight-loss medications, and med management appointments    Valet parking services will be available as well.

## 2023-09-13 DIAGNOSIS — R5381 Other malaise: Secondary | ICD-10-CM | POA: Diagnosis not present

## 2023-09-13 DIAGNOSIS — M6281 Muscle weakness (generalized): Secondary | ICD-10-CM | POA: Diagnosis not present

## 2023-09-13 DIAGNOSIS — Z951 Presence of aortocoronary bypass graft: Secondary | ICD-10-CM | POA: Diagnosis not present

## 2023-09-13 DIAGNOSIS — R2681 Unsteadiness on feet: Secondary | ICD-10-CM | POA: Diagnosis not present

## 2023-09-14 ENCOUNTER — Encounter (INDEPENDENT_AMBULATORY_CARE_PROVIDER_SITE_OTHER): Payer: Self-pay | Admitting: Ophthalmology

## 2023-09-14 ENCOUNTER — Ambulatory Visit (INDEPENDENT_AMBULATORY_CARE_PROVIDER_SITE_OTHER): Payer: PPO | Admitting: Ophthalmology

## 2023-09-14 ENCOUNTER — Other Ambulatory Visit (HOSPITAL_COMMUNITY): Payer: PPO

## 2023-09-14 DIAGNOSIS — E113313 Type 2 diabetes mellitus with moderate nonproliferative diabetic retinopathy with macular edema, bilateral: Secondary | ICD-10-CM | POA: Diagnosis not present

## 2023-09-14 DIAGNOSIS — H35012 Changes in retinal vascular appearance, left eye: Secondary | ICD-10-CM | POA: Diagnosis not present

## 2023-09-14 DIAGNOSIS — H25813 Combined forms of age-related cataract, bilateral: Secondary | ICD-10-CM

## 2023-09-14 DIAGNOSIS — I10 Essential (primary) hypertension: Secondary | ICD-10-CM | POA: Diagnosis not present

## 2023-09-14 DIAGNOSIS — H35033 Hypertensive retinopathy, bilateral: Secondary | ICD-10-CM

## 2023-09-14 MED ORDER — BEVACIZUMAB CHEMO INJECTION 1.25MG/0.05ML SYRINGE FOR KALEIDOSCOPE
1.2500 mg | INTRAVITREAL | Status: AC | PRN
  Administered 2023-09-14: 1.25 mg via INTRAVITREAL

## 2023-09-15 DIAGNOSIS — I25810 Atherosclerosis of coronary artery bypass graft(s) without angina pectoris: Secondary | ICD-10-CM | POA: Diagnosis not present

## 2023-09-15 DIAGNOSIS — M6281 Muscle weakness (generalized): Secondary | ICD-10-CM | POA: Diagnosis not present

## 2023-09-15 DIAGNOSIS — I350 Nonrheumatic aortic (valve) stenosis: Secondary | ICD-10-CM | POA: Diagnosis not present

## 2023-09-16 DIAGNOSIS — R5381 Other malaise: Secondary | ICD-10-CM | POA: Diagnosis not present

## 2023-09-16 DIAGNOSIS — M6281 Muscle weakness (generalized): Secondary | ICD-10-CM | POA: Diagnosis not present

## 2023-09-16 DIAGNOSIS — Z951 Presence of aortocoronary bypass graft: Secondary | ICD-10-CM | POA: Diagnosis not present

## 2023-09-16 DIAGNOSIS — R2681 Unsteadiness on feet: Secondary | ICD-10-CM | POA: Diagnosis not present

## 2023-09-17 ENCOUNTER — Telehealth (HOSPITAL_COMMUNITY): Payer: Self-pay

## 2023-09-17 ENCOUNTER — Encounter: Payer: Self-pay | Admitting: Cardiovascular Disease

## 2023-09-17 NOTE — Telephone Encounter (Signed)
 Pt called in regards to CR, adv pt of note below.  Patient verbalized understanding.

## 2023-09-20 DIAGNOSIS — R2681 Unsteadiness on feet: Secondary | ICD-10-CM | POA: Diagnosis not present

## 2023-09-20 DIAGNOSIS — M6281 Muscle weakness (generalized): Secondary | ICD-10-CM | POA: Diagnosis not present

## 2023-09-20 DIAGNOSIS — Z951 Presence of aortocoronary bypass graft: Secondary | ICD-10-CM | POA: Diagnosis not present

## 2023-09-20 DIAGNOSIS — R5381 Other malaise: Secondary | ICD-10-CM | POA: Diagnosis not present

## 2023-09-21 DIAGNOSIS — I25810 Atherosclerosis of coronary artery bypass graft(s) without angina pectoris: Secondary | ICD-10-CM | POA: Diagnosis not present

## 2023-09-21 DIAGNOSIS — M6281 Muscle weakness (generalized): Secondary | ICD-10-CM | POA: Diagnosis not present

## 2023-09-21 DIAGNOSIS — I350 Nonrheumatic aortic (valve) stenosis: Secondary | ICD-10-CM | POA: Diagnosis not present

## 2023-09-22 DIAGNOSIS — Z951 Presence of aortocoronary bypass graft: Secondary | ICD-10-CM | POA: Diagnosis not present

## 2023-09-22 DIAGNOSIS — R2681 Unsteadiness on feet: Secondary | ICD-10-CM | POA: Diagnosis not present

## 2023-09-22 DIAGNOSIS — M6281 Muscle weakness (generalized): Secondary | ICD-10-CM | POA: Diagnosis not present

## 2023-09-22 DIAGNOSIS — R5381 Other malaise: Secondary | ICD-10-CM | POA: Diagnosis not present

## 2023-09-23 ENCOUNTER — Ambulatory Visit (HOSPITAL_BASED_OUTPATIENT_CLINIC_OR_DEPARTMENT_OTHER)
Admission: RE | Admit: 2023-09-23 | Discharge: 2023-09-23 | Disposition: A | Discharge status: Home / Self Care | Payer: PPO | Source: Ambulatory Visit | Attending: Internal Medicine | Admitting: Internal Medicine

## 2023-09-23 ENCOUNTER — Other Ambulatory Visit (HOSPITAL_BASED_OUTPATIENT_CLINIC_OR_DEPARTMENT_OTHER): Payer: Self-pay | Admitting: Internal Medicine

## 2023-09-23 DIAGNOSIS — R918 Other nonspecific abnormal finding of lung field: Secondary | ICD-10-CM | POA: Diagnosis not present

## 2023-09-23 DIAGNOSIS — E039 Hypothyroidism, unspecified: Secondary | ICD-10-CM | POA: Diagnosis not present

## 2023-09-23 DIAGNOSIS — I480 Paroxysmal atrial fibrillation: Secondary | ICD-10-CM | POA: Diagnosis not present

## 2023-09-23 DIAGNOSIS — Z951 Presence of aortocoronary bypass graft: Secondary | ICD-10-CM | POA: Diagnosis not present

## 2023-09-23 DIAGNOSIS — N179 Acute kidney failure, unspecified: Secondary | ICD-10-CM | POA: Diagnosis not present

## 2023-09-23 DIAGNOSIS — J90 Pleural effusion, not elsewhere classified: Secondary | ICD-10-CM | POA: Diagnosis not present

## 2023-09-23 DIAGNOSIS — N184 Chronic kidney disease, stage 4 (severe): Secondary | ICD-10-CM | POA: Diagnosis not present

## 2023-09-23 DIAGNOSIS — Z952 Presence of prosthetic heart valve: Secondary | ICD-10-CM | POA: Diagnosis not present

## 2023-09-23 DIAGNOSIS — R339 Retention of urine, unspecified: Secondary | ICD-10-CM | POA: Diagnosis not present

## 2023-09-23 DIAGNOSIS — I10 Essential (primary) hypertension: Secondary | ICD-10-CM | POA: Diagnosis not present

## 2023-09-23 DIAGNOSIS — E1121 Type 2 diabetes mellitus with diabetic nephropathy: Secondary | ICD-10-CM | POA: Diagnosis not present

## 2023-09-24 ENCOUNTER — Encounter: Payer: Self-pay | Admitting: Cardiovascular Disease

## 2023-09-24 DIAGNOSIS — I25810 Atherosclerosis of coronary artery bypass graft(s) without angina pectoris: Secondary | ICD-10-CM | POA: Diagnosis not present

## 2023-09-24 DIAGNOSIS — I350 Nonrheumatic aortic (valve) stenosis: Secondary | ICD-10-CM | POA: Diagnosis not present

## 2023-09-24 DIAGNOSIS — M6281 Muscle weakness (generalized): Secondary | ICD-10-CM | POA: Diagnosis not present

## 2023-09-27 DIAGNOSIS — M6281 Muscle weakness (generalized): Secondary | ICD-10-CM | POA: Diagnosis not present

## 2023-09-27 DIAGNOSIS — R2681 Unsteadiness on feet: Secondary | ICD-10-CM | POA: Diagnosis not present

## 2023-09-27 DIAGNOSIS — Z951 Presence of aortocoronary bypass graft: Secondary | ICD-10-CM | POA: Diagnosis not present

## 2023-09-27 DIAGNOSIS — R5381 Other malaise: Secondary | ICD-10-CM | POA: Diagnosis not present

## 2023-09-28 ENCOUNTER — Telehealth: Payer: Self-pay | Admitting: Physician Assistant

## 2023-09-28 ENCOUNTER — Encounter: Payer: Self-pay | Admitting: Cardiovascular Disease

## 2023-09-28 DIAGNOSIS — M6281 Muscle weakness (generalized): Secondary | ICD-10-CM | POA: Diagnosis not present

## 2023-09-28 DIAGNOSIS — I350 Nonrheumatic aortic (valve) stenosis: Secondary | ICD-10-CM | POA: Diagnosis not present

## 2023-09-28 DIAGNOSIS — I25810 Atherosclerosis of coronary artery bypass graft(s) without angina pectoris: Secondary | ICD-10-CM | POA: Diagnosis not present

## 2023-09-28 NOTE — Telephone Encounter (Signed)
 Patient's daughter contacted cardiology service as he was complaining of some shortness of breath recently.  Looking at the raw picture of his chest x-ray from last week, he has at least moderate if not a large pleural effusion on the left side.  Pleural effusion occupies one half of the left lung.  According to the daughter, this is not surprising as when he was discharged from [facility] recently, he has left pleural effusion actually occupies more than one half of the left lung.  Apparently his O2 saturation has been hovering around 87 to 89%.  They are aware to take the patient to the ED if he develop shortness of breath at rest or paroxysmal nocturnal dyspnea.  I asked the daughter to contact cardiology service first thing in the morning to arrange a earlier follow-up.  He needed a ambulatory O2 saturation, potentially need to be set up with a home oxygen.  He also likely will need to arrange a thoracentesis by interventional radiology service.

## 2023-09-29 ENCOUNTER — Encounter (HOSPITAL_COMMUNITY): Payer: Self-pay

## 2023-09-29 ENCOUNTER — Inpatient Hospital Stay (HOSPITAL_COMMUNITY)
Admission: EM | Admit: 2023-09-29 | Discharge: 2023-10-01 | DRG: 205 | Disposition: A | Discharge status: Home Health | Attending: Internal Medicine | Admitting: Internal Medicine

## 2023-09-29 ENCOUNTER — Emergency Department (HOSPITAL_COMMUNITY)

## 2023-09-29 ENCOUNTER — Other Ambulatory Visit: Payer: Self-pay

## 2023-09-29 ENCOUNTER — Telehealth: Payer: Self-pay | Admitting: Cardiovascular Disease

## 2023-09-29 DIAGNOSIS — Z860100 Personal history of colon polyps, unspecified: Secondary | ICD-10-CM

## 2023-09-29 DIAGNOSIS — Z952 Presence of prosthetic heart valve: Secondary | ICD-10-CM

## 2023-09-29 DIAGNOSIS — Z79899 Other long term (current) drug therapy: Secondary | ICD-10-CM

## 2023-09-29 DIAGNOSIS — E872 Acidosis, unspecified: Secondary | ICD-10-CM | POA: Diagnosis present

## 2023-09-29 DIAGNOSIS — Y838 Other surgical procedures as the cause of abnormal reaction of the patient, or of later complication, without mention of misadventure at the time of the procedure: Secondary | ICD-10-CM | POA: Diagnosis present

## 2023-09-29 DIAGNOSIS — Z823 Family history of stroke: Secondary | ICD-10-CM

## 2023-09-29 DIAGNOSIS — Z951 Presence of aortocoronary bypass graft: Secondary | ICD-10-CM | POA: Diagnosis not present

## 2023-09-29 DIAGNOSIS — J9589 Other postprocedural complications and disorders of respiratory system, not elsewhere classified: Principal | ICD-10-CM | POA: Diagnosis present

## 2023-09-29 DIAGNOSIS — J9601 Acute respiratory failure with hypoxia: Secondary | ICD-10-CM | POA: Diagnosis not present

## 2023-09-29 DIAGNOSIS — R918 Other nonspecific abnormal finding of lung field: Secondary | ICD-10-CM | POA: Diagnosis not present

## 2023-09-29 DIAGNOSIS — D649 Anemia, unspecified: Secondary | ICD-10-CM | POA: Insufficient documentation

## 2023-09-29 DIAGNOSIS — M6281 Muscle weakness (generalized): Secondary | ICD-10-CM | POA: Diagnosis not present

## 2023-09-29 DIAGNOSIS — I517 Cardiomegaly: Secondary | ICD-10-CM | POA: Diagnosis not present

## 2023-09-29 DIAGNOSIS — E7800 Pure hypercholesterolemia, unspecified: Secondary | ICD-10-CM | POA: Diagnosis present

## 2023-09-29 DIAGNOSIS — R0902 Hypoxemia: Secondary | ICD-10-CM | POA: Diagnosis not present

## 2023-09-29 DIAGNOSIS — E1159 Type 2 diabetes mellitus with other circulatory complications: Secondary | ICD-10-CM | POA: Insufficient documentation

## 2023-09-29 DIAGNOSIS — I129 Hypertensive chronic kidney disease with stage 1 through stage 4 chronic kidney disease, or unspecified chronic kidney disease: Secondary | ICD-10-CM | POA: Diagnosis present

## 2023-09-29 DIAGNOSIS — I3139 Other pericardial effusion (noninflammatory): Secondary | ICD-10-CM | POA: Diagnosis not present

## 2023-09-29 DIAGNOSIS — E039 Hypothyroidism, unspecified: Secondary | ICD-10-CM | POA: Diagnosis not present

## 2023-09-29 DIAGNOSIS — Z83511 Family history of glaucoma: Secondary | ICD-10-CM

## 2023-09-29 DIAGNOSIS — N183 Chronic kidney disease, stage 3 unspecified: Secondary | ICD-10-CM | POA: Insufficient documentation

## 2023-09-29 DIAGNOSIS — K219 Gastro-esophageal reflux disease without esophagitis: Secondary | ICD-10-CM | POA: Diagnosis present

## 2023-09-29 DIAGNOSIS — R0602 Shortness of breath: Secondary | ICD-10-CM | POA: Diagnosis not present

## 2023-09-29 DIAGNOSIS — I480 Paroxysmal atrial fibrillation: Secondary | ICD-10-CM | POA: Diagnosis present

## 2023-09-29 DIAGNOSIS — J9811 Atelectasis: Secondary | ICD-10-CM | POA: Diagnosis not present

## 2023-09-29 DIAGNOSIS — Z48812 Encounter for surgical aftercare following surgery on the circulatory system: Secondary | ICD-10-CM | POA: Diagnosis not present

## 2023-09-29 DIAGNOSIS — Z79890 Hormone replacement therapy (postmenopausal): Secondary | ICD-10-CM

## 2023-09-29 DIAGNOSIS — E1142 Type 2 diabetes mellitus with diabetic polyneuropathy: Secondary | ICD-10-CM | POA: Diagnosis present

## 2023-09-29 DIAGNOSIS — N184 Chronic kidney disease, stage 4 (severe): Secondary | ICD-10-CM | POA: Diagnosis present

## 2023-09-29 DIAGNOSIS — E8809 Other disorders of plasma-protein metabolism, not elsewhere classified: Secondary | ICD-10-CM | POA: Diagnosis present

## 2023-09-29 DIAGNOSIS — J9600 Acute respiratory failure, unspecified whether with hypoxia or hypercapnia: Secondary | ICD-10-CM | POA: Insufficient documentation

## 2023-09-29 DIAGNOSIS — D631 Anemia in chronic kidney disease: Secondary | ICD-10-CM | POA: Diagnosis present

## 2023-09-29 DIAGNOSIS — R946 Abnormal results of thyroid function studies: Secondary | ICD-10-CM | POA: Diagnosis present

## 2023-09-29 DIAGNOSIS — Z794 Long term (current) use of insulin: Secondary | ICD-10-CM

## 2023-09-29 DIAGNOSIS — J90 Pleural effusion, not elsewhere classified: Secondary | ICD-10-CM | POA: Diagnosis not present

## 2023-09-29 DIAGNOSIS — J189 Pneumonia, unspecified organism: Secondary | ICD-10-CM | POA: Diagnosis not present

## 2023-09-29 DIAGNOSIS — Z7982 Long term (current) use of aspirin: Secondary | ICD-10-CM

## 2023-09-29 DIAGNOSIS — I2510 Atherosclerotic heart disease of native coronary artery without angina pectoris: Secondary | ICD-10-CM | POA: Diagnosis present

## 2023-09-29 DIAGNOSIS — R2681 Unsteadiness on feet: Secondary | ICD-10-CM | POA: Diagnosis not present

## 2023-09-29 DIAGNOSIS — I10 Essential (primary) hypertension: Secondary | ICD-10-CM | POA: Diagnosis not present

## 2023-09-29 DIAGNOSIS — R0609 Other forms of dyspnea: Secondary | ICD-10-CM | POA: Diagnosis not present

## 2023-09-29 DIAGNOSIS — D696 Thrombocytopenia, unspecified: Secondary | ICD-10-CM | POA: Diagnosis present

## 2023-09-29 DIAGNOSIS — E1122 Type 2 diabetes mellitus with diabetic chronic kidney disease: Secondary | ICD-10-CM | POA: Diagnosis present

## 2023-09-29 DIAGNOSIS — Z7984 Long term (current) use of oral hypoglycemic drugs: Secondary | ICD-10-CM

## 2023-09-29 DIAGNOSIS — E11319 Type 2 diabetes mellitus with unspecified diabetic retinopathy without macular edema: Secondary | ICD-10-CM | POA: Diagnosis present

## 2023-09-29 DIAGNOSIS — R5381 Other malaise: Secondary | ICD-10-CM | POA: Diagnosis not present

## 2023-09-29 DIAGNOSIS — R059 Cough, unspecified: Secondary | ICD-10-CM | POA: Diagnosis not present

## 2023-09-29 DIAGNOSIS — Z87891 Personal history of nicotine dependence: Secondary | ICD-10-CM

## 2023-09-29 HISTORY — PX: IR THORACENTESIS ASP PLEURAL SPACE W/IMG GUIDE: IMG5380

## 2023-09-29 LAB — GLUCOSE, PLEURAL OR PERITONEAL FLUID: Glucose, Fluid: 322 mg/dL

## 2023-09-29 LAB — BASIC METABOLIC PANEL
Anion gap: 11 (ref 5–15)
BUN: 60 mg/dL — ABNORMAL HIGH (ref 8–23)
CO2: 21 mmol/L — ABNORMAL LOW (ref 22–32)
Calcium: 9.7 mg/dL (ref 8.9–10.3)
Chloride: 101 mmol/L (ref 98–111)
Creatinine, Ser: 3.89 mg/dL — ABNORMAL HIGH (ref 0.61–1.24)
GFR, Estimated: 15 mL/min — ABNORMAL LOW (ref >60)
Glucose, Bld: 295 mg/dL — ABNORMAL HIGH (ref 70–99)
Potassium: 4.9 mmol/L (ref 3.5–5.1)
Sodium: 133 mmol/L — ABNORMAL LOW (ref 135–145)

## 2023-09-29 LAB — CBC WITH DIFFERENTIAL/PLATELET
Abs Immature Granulocytes: 0.1 10*3/uL — ABNORMAL HIGH (ref 0.00–0.07)
Basophils Absolute: 0 10*3/uL (ref 0.0–0.1)
Basophils Relative: 1 %
Eosinophils Absolute: 0.2 10*3/uL (ref 0.0–0.5)
Eosinophils Relative: 2 %
HCT: 30.6 % — ABNORMAL LOW (ref 39.0–52.0)
Hemoglobin: 10.1 g/dL — ABNORMAL LOW (ref 13.0–17.0)
Immature Granulocytes: 1 %
Lymphocytes Relative: 12 %
Lymphs Abs: 0.9 10*3/uL (ref 0.7–4.0)
MCH: 29.1 pg (ref 26.0–34.0)
MCHC: 33 g/dL (ref 30.0–36.0)
MCV: 88.2 fL (ref 80.0–100.0)
Monocytes Absolute: 0.5 10*3/uL (ref 0.1–1.0)
Monocytes Relative: 6 %
Neutro Abs: 6.1 10*3/uL (ref 1.7–7.7)
Neutrophils Relative %: 78 %
Platelets: 116 10*3/uL — ABNORMAL LOW (ref 150–400)
RBC: 3.47 MIL/uL — ABNORMAL LOW (ref 4.22–5.81)
RDW: 15.3 % (ref 11.5–15.5)
WBC: 7.8 10*3/uL (ref 4.0–10.5)
nRBC: 0 % (ref 0.0–0.2)

## 2023-09-29 LAB — GLUCOSE, CAPILLARY: Glucose-Capillary: 243 mg/dL — ABNORMAL HIGH (ref 70–99)

## 2023-09-29 LAB — PROTEIN, PLEURAL OR PERITONEAL FLUID: Total protein, fluid: 3.7 g/dL

## 2023-09-29 LAB — LACTATE DEHYDROGENASE, PLEURAL OR PERITONEAL FLUID: LD, Fluid: 181 U/L — ABNORMAL HIGH (ref 3–23)

## 2023-09-29 MED ORDER — INSULIN GLARGINE 100 UNIT/ML ~~LOC~~ SOLN
14.0000 [IU] | Freq: Every day | SUBCUTANEOUS | Status: DC
  Administered 2023-09-30 (×2): 14 [IU] via SUBCUTANEOUS
  Filled 2023-09-29 (×3): qty 0.14

## 2023-09-29 MED ORDER — GABAPENTIN 300 MG PO CAPS
600.0000 mg | ORAL_CAPSULE | Freq: Two times a day (BID) | ORAL | Status: DC
  Administered 2023-09-30 – 2023-10-01 (×3): 600 mg via ORAL
  Filled 2023-09-29 (×3): qty 2

## 2023-09-29 MED ORDER — SODIUM CHLORIDE 0.9 % IV SOLN
1.0000 g | Freq: Once | INTRAVENOUS | Status: AC
  Administered 2023-09-29: 1 g via INTRAVENOUS
  Filled 2023-09-29: qty 10

## 2023-09-29 MED ORDER — METOPROLOL TARTRATE 12.5 MG HALF TABLET
12.5000 mg | ORAL_TABLET | Freq: Two times a day (BID) | ORAL | Status: DC
  Administered 2023-09-30 – 2023-10-01 (×3): 12.5 mg via ORAL
  Filled 2023-09-29 (×3): qty 1

## 2023-09-29 MED ORDER — ASPIRIN 81 MG PO CHEW
81.0000 mg | CHEWABLE_TABLET | Freq: Every day | ORAL | Status: DC
  Administered 2023-09-30 – 2023-10-01 (×2): 81 mg via ORAL
  Filled 2023-09-29 (×2): qty 1

## 2023-09-29 MED ORDER — ROSUVASTATIN CALCIUM 20 MG PO TABS
20.0000 mg | ORAL_TABLET | Freq: Every day | ORAL | Status: DC
  Administered 2023-09-30: 20 mg via ORAL
  Filled 2023-09-29 (×2): qty 1

## 2023-09-29 MED ORDER — LIDOCAINE HCL 1 % IJ SOLN
INTRAMUSCULAR | Status: AC
  Filled 2023-09-29: qty 20

## 2023-09-29 MED ORDER — EMPAGLIFLOZIN 10 MG PO TABS
10.0000 mg | ORAL_TABLET | Freq: Every day | ORAL | Status: DC
  Administered 2023-09-30 – 2023-10-01 (×2): 10 mg via ORAL
  Filled 2023-09-29 (×2): qty 1

## 2023-09-29 MED ORDER — ACETAMINOPHEN 650 MG RE SUPP: 650.0000 mg | Freq: Four times a day (QID) | RECTAL | Status: DC | PRN

## 2023-09-29 MED ORDER — POLYETHYLENE GLYCOL 3350 17 G PO PACK: 17.0000 g | PACK | Freq: Every day | ORAL | Status: DC | PRN

## 2023-09-29 MED ORDER — LIDOCAINE HCL 1 % IJ SOLN
20.0000 mL | Freq: Once | INTRAMUSCULAR | Status: AC
  Administered 2023-09-29: 10 mL

## 2023-09-29 MED ORDER — ACETAMINOPHEN 325 MG PO TABS: 650.0000 mg | ORAL_TABLET | Freq: Four times a day (QID) | ORAL | Status: DC | PRN

## 2023-09-29 MED ORDER — INSULIN ASPART 100 UNIT/ML IJ SOLN
0.0000 [IU] | Freq: Three times a day (TID) | INTRAMUSCULAR | Status: DC
  Administered 2023-09-30: 2 [IU] via SUBCUTANEOUS
  Administered 2023-09-30: 5 [IU] via SUBCUTANEOUS
  Administered 2023-09-30: 1 [IU] via SUBCUTANEOUS
  Administered 2023-10-01: 5 [IU] via SUBCUTANEOUS
  Administered 2023-10-01: 1 [IU] via SUBCUTANEOUS
  Administered 2023-10-01: 3 [IU] via SUBCUTANEOUS

## 2023-09-29 MED ORDER — SODIUM CHLORIDE 0.9 % IV SOLN
500.0000 mg | Freq: Once | INTRAVENOUS | Status: AC
  Administered 2023-09-29: 500 mg via INTRAVENOUS
  Filled 2023-09-29: qty 5

## 2023-09-29 MED ORDER — AMLODIPINE BESYLATE 10 MG PO TABS
10.0000 mg | ORAL_TABLET | Freq: Every day | ORAL | Status: DC
  Administered 2023-09-30 – 2023-10-01 (×2): 10 mg via ORAL
  Filled 2023-09-29 (×2): qty 1

## 2023-09-29 MED ORDER — LEVOTHYROXINE SODIUM 75 MCG PO TABS
75.0000 ug | ORAL_TABLET | Freq: Every day | ORAL | Status: DC
  Administered 2023-09-30: 75 ug via ORAL
  Filled 2023-09-29: qty 1

## 2023-09-29 NOTE — Telephone Encounter (Signed)
   Pt c/o of Chest Pain: STAT if active CP, including tightness, pressure, jaw pain, radiating pain to shoulder/upper arm/back, CP unrelieved by Nitro. Symptoms reported of SOB, nausea, vomiting, sweating.  1. Are you having CP right now?   No.  Patient stated they happen when he lies down with his head up  2. Are you experiencing any other symptoms (ex. SOB, nausea, vomiting, sweating)?   SOB  3. Is your CP continuous or coming and going?   Continuous when lying down  4. Have you taken Nitroglycerin?   No  5. How long have you been experiencing CP?   Patient noticed this for the first time last night  6. If NO CP at time of call then end call with telling Pt to call back or call 911 if Chest pain returns prior to return call from triage team.   Patient noted when he lies flat this does not happen.

## 2023-09-29 NOTE — H&P (Incomplete)
 History and Physical    Chad [name] ZOX:[id number] DOB: 21-Feb-1946 DOA: 09/29/2023  Patient coming from: Home.  Chief Complaint: Shortness of breath.  HPI: Chad [name] is a 78 y.o. male with history of CAD status post aortic valve replacement recently in January 2025 at [facility], diabetes mellitus type 2, chronic kidney disease stage IV, anemia has been experiencing increasing shortness of breath last few days and presents to the ER.  Also last few days patient has been having some productive cough more than usual.  Patient had CABG and AVR at [facility] in January 2025 and course was complicated with postoperative atrial fibrillation for which patient was taking amiodarone and mexiletine until last week and had worsening renal function with urinary retention with peak creatinine more than 5 which has gradually improved.  ED Course: In the ER CT chest shows large left pleural effusion.  Also shows right-sided infiltrates concerning for pneumonia for which patient was placed on antibiotics.  Had left-sided thoracentesis done and patient is presently on 2 L oxygen.  Admitted for further observation.  Labs show creatinine of 3.8 WBC 7.8 hemoglobin 10.1.  Platelets 116.  Review of Systems: As per HPI, rest all negative.   Past Medical History:  Diagnosis Date   Cataract    CKD (chronic kidney disease)    Colon polyps    Diabetes (HCC)    Diabetic polyneuropathy associated with type 2 diabetes mellitus (HCC) 01/15/2017   Diabetic retinopathy (HCC)    GERD (gastroesophageal reflux disease)    Hypercholesterolemia    Hypertension    Hypertensive retinopathy    Hypothyroidism    Polyneuropathy    Proteinuria    Severe aortic stenosis     Past Surgical History:  Procedure Laterality Date   CARDIAC SURGERY     IR THORACENTESIS ASP PLEURAL SPACE W/IMG GUIDE  09/29/2023   RIGHT/LEFT HEART CATH AND CORONARY ANGIOGRAPHY N/A 05/21/2023   Procedure:  RIGHT/LEFT HEART CATH AND CORONARY ANGIOGRAPHY;  Surgeon: Orbie Pyo, MD;  Location: [facility] INVASIVE CV LAB;  Service: Cardiovascular;  Laterality: N/A;     reports that he has quit smoking. He has never used smokeless tobacco. He reports current alcohol use. He reports that he does not use drugs.  Allergies  Allergen Reactions   Pork-Derived Products Other (See Comments)    Family History  Problem Relation Age of Onset   Stroke Father    Glaucoma Father     Prior to Admission medications   Medication Sig Start Date End Date Taking? Authorizing Provider  amiodarone (PACERONE) 400 MG tablet TAKE 1 TABLET BY MOUTH TWICE A DAY UNTIL 08/29/23, THEN REDUCE TO 1 TABLET BY MOUTH DAILY Patient taking differently: Take 400 mg by mouth daily. TAKE 1 TABLET BY MOUTH TWICE A DAY UNTIL 08/29/23, THEN REDUCE TO 1 TABLET BY MOUTH DAILY 08/23/23  Yes Georgie Chard D, NP  amLODipine (NORVASC) 10 MG tablet Take 1 tablet (10 mg total) by mouth daily. 09/10/23  Yes Wendall Stade, MD  aspirin 81 MG chewable tablet Chew 81 mg by mouth daily.   Yes [provider]  gabapentin (NEURONTIN) 300 MG capsule Take 600 mg by mouth 2 (two) times daily.   Yes [provider]  insulin glargine (LANTUS) 100 UNIT/ML injection Inject 14 Units into the skin at bedtime.   Yes [provider]  JARDIANCE 10 MG TABS tablet Take 10 mg by mouth daily. 11/11/20  Yes [provider]  levothyroxine (SYNTHROID, LEVOTHROID) 75 MCG tablet Take 75 mcg by mouth daily before breakfast.  12/28/16  Yes [provider]  metoprolol tartrate (LOPRESSOR) 25 MG tablet Take 12.5 mg by mouth 2 (two) times daily. 08/21/23 08/20/24 Yes [provider]  polyethylene glycol (MIRALAX / GLYCOLAX) 17 g packet Take 17 g by mouth as needed for moderate constipation. 08/21/23  Yes [provider]  rosuvastatin (CRESTOR) 20 MG tablet Take 20 mg by mouth daily.   Yes [provider]  B-D  ULTRAFINE III SHORT PEN 31G X 8 MM MISC SMARTSIG:1 Each SUB-Q Daily 04/25/21   [provider]  furosemide (LASIX) 20 MG tablet Take 1 tablet (20 mg total) by mouth daily. Patient not taking: Reported on 09/10/2023 08/24/23 08/23/24  Filbert Schilder, NP  [facility] VERIO test strip  04/24/21   [provider]  mexiletine (MEXITIL) 200 MG capsule Take 200 mg by mouth every 8 (eight) hours. Patient not taking: Reported on 09/14/2023 08/21/23 08/20/24  [provider]    Physical Exam: Constitutional: Moderately built and nourished. Vitals:   09/29/23 2123 09/29/23 2130 09/29/23 2245 09/29/23 2327  BP:  (!) 140/52 (!) 107/57 117/69  Pulse:  64 63 68  Resp:  (!) 24 (!) 24 16  Temp: 98.9 F (37.2 C)     SpO2:  92% 97% 90%  Weight:      Height:       Eyes: Anicteric no pallor. ENMT: No discharge from the ears eyes nose or mouth. Neck: No mass felt.  No neck rigidity. Respiratory: No rhonchi or crepitations. Cardiovascular: S1-S2 heard. Abdomen: Soft nontender bowel sound present. Musculoskeletal: No edema. Skin: No rash. Neurologic: Alert awake oriented to time place and person.  Moves all extremities. Psychiatric: Appear normal.  Normal affect.   Labs on Admission: I have personally reviewed following labs and imaging studies  CBC: Recent Labs  Lab 09/29/23 1830  WBC 7.8  NEUTROABS 6.1  HGB 10.1*  HCT 30.6*  MCV 88.2  PLT 116*   Basic Metabolic Panel: Recent Labs  Lab 09/29/23 1830  NA 133*  K 4.9  CL 101  CO2 21*  GLUCOSE 295*  BUN 60*  CREATININE 3.89*  CALCIUM 9.7   GFR: Estimated Creatinine Clearance: 15.6 mL/min (A) (by C-G formula based on SCr of 3.89 mg/dL (H)). Liver Function Tests: No results for input(s): "AST", "ALT", "ALKPHOS", "BILITOT", "PROT", "ALBUMIN" in the last 168 hours. No results for input(s): "LIPASE", "AMYLASE" in the last 168 hours. No results for input(s): "AMMONIA" in the last 168 hours. Coagulation Profile: No  results for input(s): "INR", "PROTIME" in the last 168 hours. Cardiac Enzymes: No results for input(s): "CKTOTAL", "CKMB", "CKMBINDEX", "TROPONINI" in the last 168 hours. BNP (last 3 results) No results for input(s): "PROBNP" in the last 8760 hours. HbA1C: No results for input(s): "HGBA1C" in the last 72 hours. CBG: No results for input(s): "GLUCAP" in the last 168 hours. Lipid Profile: No results for input(s): "CHOL", "HDL", "LDLCALC", "TRIG", "CHOLHDL", "LDLDIRECT" in the last 72 hours. Thyroid Function Tests: No results for input(s): "TSH", "T4TOTAL", "FREET4", "T3FREE", "THYROIDAB" in the last 72 hours. Anemia Panel: No results for input(s): "VITAMINB12", "FOLATE", "FERRITIN", "TIBC", "IRON", "RETICCTPCT" in the last 72 hours. Urine analysis: No results found for: "COLORURINE", "APPEARANCEUR", "LABSPEC", "PHURINE", "GLUCOSEU", "HGBUR", "BILIRUBINUR", "KETONESUR", "PROTEINUR", "UROBILINOGEN", "NITRITE", "LEUKOCYTESUR" Sepsis Labs: @LABRCNTIP (procalcitonin:4,lacticidven:4) ) Recent Results (from the past 240 hours)  Body fluid culture w Gram Stain     Status:  None (Preliminary result)   Collection Time: 09/29/23  4:33 PM   Specimen: Abdomen; Peritoneal Fluid  Result Value Ref Range Status   Specimen Description PERITONEAL  Final   Special Requests NONE  Final   Gram Stain   Final    RARE WBC PRESENT, PREDOMINANTLY MONONUCLEAR NO ORGANISMS SEEN Performed at [facility], [address] [place], [place] [address]    Culture PENDING  Incomplete   Report Status PENDING  Incomplete     Radiological Exams on Admission: CT CHEST WO CONTRAST Addendum Date: 09/29/2023 ADDENDUM REPORT: 09/29/2023 21:17 ADDENDUM: Addendum to add additional findings upon further review. Acute comminuted fracture of the left first rib. Additional nondisplaced fracture of the left second rib. These results were called by telephone at the time of interpretation on 09/29/2023 at 9:00 pm to provider Dr.  Toniann Fail, Who verbally acknowledged these results. Electronically Signed   By: Minerva Fester M.D.   On: 09/29/2023 21:17   Result Date: 09/29/2023 CLINICAL DATA:  Shortness of breath low oxygen saturations. Open heart surgery in January 2025 EXAM: CT CHEST WITHOUT CONTRAST TECHNIQUE: Multidetector CT imaging of the chest was performed following the standard protocol without IV contrast. RADIATION DOSE REDUCTION: This exam was performed according to the departmental dose-optimization program which includes automated exposure control, adjustment of the mA and/or kV according to patient size and/or use of iterative reconstruction technique. COMPARISON:  Same day chest radiographs and CT 09/23/2022 FINDINGS: Cardiovascular: Small pericardial effusion. Sternotomy with unhealed sternal fracture compatible with recent open heart surgery. TAVR. Coronary artery calcification. Aortic atherosclerotic calcification. Mediastinum/Nodes: Small amount of free fluid in the anterior mediastinum compatible with recent heart surgery. Trachea and esophagus are unremarkable. Evaluation for adenopathy is limited without IV contrast. Lungs/Pleura: Large left pleural effusion. Near total atelectasis of the left lower lobe and partial atelectasis of the left upper lobe. Patchy ground-glass opacities throughout the right lung. Small right pleural effusion. No pneumothorax. Upper Abdomen: No acute abnormality. Musculoskeletal: No acute fracture. IMPRESSION: 1. Large left pleural effusion with near total atelectasis of the left lower lobe and partial atelectasis of the left upper lobe. 2. Patchy ground-glass opacities throughout the right lung, favor atypical infection. 3. Small right pleural effusion. 4. Postoperative change of open heart surgery in January 2025. Small amount of residual fluid in the anterior mediastinum. No evidence of abscess. 5. Small pericardial effusion likely related to recent open heart surgery. Pericarditis is  difficult to exclude. 6. Aortic Atherosclerosis (ICD10-I70.0). Electronically Signed: By: Minerva Fester M.D. On: 09/29/2023 19:18   DG Chest 2 View Result Date: 09/29/2023 CLINICAL DATA:  Status post open heart surgery in January 2025 presenting with shortness of breath and nonproductive cough. EXAM: CHEST - 2 VIEW COMPARISON:  September 23, 2023 FINDINGS: Multiple sternal wires are present. The heart size and visualized mediastinal contours are within normal limits. An artificial aortic valve is suspected. A stable, moderate to large left-sided pleural effusion is seen. Underlying atelectasis and/or infiltrate is suspected. No pneumothorax is identified. Multilevel degenerative changes seen throughout the thoracic spine. IMPRESSION: Stable, moderate to large left-sided pleural effusion with suspected underlying atelectasis and/or infiltrate. Electronically Signed   By: Aram Candela M.D.   On: 09/29/2023 17:33   DG Chest 1 View Result Date: 09/29/2023 CLINICAL DATA:  78 year old male status post left-sided thoracentesis EXAM: CHEST  1 VIEW COMPARISON:  CT chest 09/29/2023, plain film 09/29/2023 FINDINGS: Cardiomediastinal silhouette unchanged. Surgical changes of median sternotomy, CABG, TAVR. Near complete resolution of  left-sided pleural fluid. Some residual opacity at the pleuroparenchymal interface at the base of the left lung. No pneumothorax. No new airspace disease. Right lung relatively well aerated. IMPRESSION: No complicating features status post left-sided thoracentesis, with mild residual fluid/consolidation. Electronically Signed   By: Gilmer Mor D.O.   On: 09/29/2023 16:59   IR THORACENTESIS ASP PLEURAL SPACE W/IMG GUIDE Result Date: 09/29/2023 INDICATION: Recent cardiac surgery with large left pleural effusion. Request for diagnostic and therapeutic thoracentesis. EXAM: ULTRASOUND GUIDED LEFT THORACENTESIS MEDICATIONS: 1% lidocaine 10 mL COMPLICATIONS: None immediate. PROCEDURE: An  ultrasound guided thoracentesis was thoroughly discussed with the patient and questions answered. The benefits, risks, alternatives and complications were also discussed. The patient understands and wishes to proceed with the procedure. Written consent was obtained. Ultrasound was performed to localize and mark an adequate pocket of fluid in the left chest. The area was then prepped and draped in the normal sterile fashion. 1% Lidocaine was used for local anesthesia. Under ultrasound guidance a 6 Fr Safe-T-Centesis catheter was introduced. Thoracentesis was performed. The catheter was removed and a dressing applied. FINDINGS: A total of approximately 1.9 L of clear amber fluid was removed. Samples were sent to the laboratory as requested by the clinical team. IMPRESSION: Successful ultrasound guided left thoracentesis yielding 1.9 L of pleural fluid. Procedure performed by: Corrin Parker, PA-C Electronically Signed   By: Gilmer Mor D.O.   On: 09/29/2023 16:47      Assessment/Plan Principal Problem:   Acute respiratory failure with hypoxia (HCC) Active Problems:   CKD (chronic kidney disease) stage 3, GFR 30-59 ml/min (HCC)   Chronic anemia   Type 2 diabetes mellitus with vascular disease (HCC)   Hx of CABG   S/P AVR   Essential hypertension    Acute respiratory failure with hypoxia presently on 2 L oxygen likely related to patient's pleural effusion post CABG.  Thoracentesis was done.  Follow-up pleural fluid labs.  CT scan also showed rib fractures involving the left first and second.  I discussed with on-call general surgery team and they will be reviewing their scan and giving their recommendations. Possible pneumonia with CT scan showing right-sided lung infiltrates and patient having persistent cough on empiric antibiotics. Diabetes mellitus type 2 takes Lantus 40 units at bedtime.  On sliding scale coverage. Hypothyroidism on Synthroid. Chronic kidney disease stage IV creatinine improved  from recent past. Chronic anemia likely from renal disease. Recent CAD status post CABG and AVR at [facility] in January 2025.  On metoprolol aspirin and statins. Hypertension on amlodipine and metoprolol.  Since patient has large left pleural effusion s/p thoracentesis requiring oxygen and possible pneumonia will need more than 2 midnight stay.   DVT prophylaxis: Lovenox. Code Status: Full code. Family Communication: Patient's wife at the bedside. Disposition Plan: Monitored bed. Consults called: None. Admission status: Observation.

## 2023-09-29 NOTE — Telephone Encounter (Signed)
 Per Dr. Eden Emms, Needs to go to ER and arrange IR radiology left thoracentesis.  Called patient with Dr. Eden Emms advisement. Patient will go to ED.

## 2023-09-29 NOTE — Telephone Encounter (Signed)
 Left message for patient to call back

## 2023-09-29 NOTE — ED Triage Notes (Signed)
 Pt c/o SOB d/t fluid on his L lung.  O2 sat 78-80% RA.  Pt reports having a recent CT at [facility].  Pt reports open heart surgery in January 2025.

## 2023-09-29 NOTE — Telephone Encounter (Signed)
 Spoke with pt regarding his chest pain and SOB. Pt stated he recently had heart surgery and it was recommended by his doctor to elevate his head when he lies down. Pt states that when he does this he has pains in his chest and has more difficulty breathing. Pt states he only has SOB and chest pain in this position. Pt was advised to call [facility] to let them know of his symptoms as it could be surgery related. Pt verbalized understanding. All questions, if any, were answered.

## 2023-09-29 NOTE — ED Provider Notes (Signed)
  Provider Note MRN:  [id number]  Arrival date & time: 09/29/23    ED Course and Medical Decision Making   Clinical Course as of 09/29/23 1947  Wed Sep 29, 2023  1620 Received from [facility] Large L pleural eff S/p cabg/ aortic graft at [facility] Worsening effusion on imaging Now hypoxic, 4LNC  Labs/imaging formal read pending IR to eval for thora Needs admit   [SG]  1921 On recheck seems to be feeling better, now on [facility] [SG]    Clinical Course User Index [SG] Sloan Leiter, DO    He had thora in the ED, 1.9 L removed Resp improved, still on [facility] CT w/ ?pna in the background, will cover with abx, not septic  Plan per prior physician admit Pt/family agreeable, spoke with son over phone with pt's permission to update plan    Admitted [facility]      CRITICAL CARE Performed by: Sloan Leiter   Total critical care time: 32 minutes  Critical care time was exclusive of separately billable procedures and treating other patients.  Critical care was necessary to treat or prevent imminent or life-threatening deterioration.  Critical care was time spent personally by me on the following activities: development of treatment plan with patient and/or surrogate as well as nursing, discussions with consultants, evaluation of patient's response to treatment, examination of patient, obtaining history from patient or surrogate, ordering and performing treatments and interventions, ordering and review of laboratory studies, ordering and review of radiographic studies, pulse oximetry and re-evaluation of patient's condition. Hypoxia, pleural eff large    Procedures  Final Clinical Impressions(s) / ED Diagnoses     ICD-10-CM   1. Pleural effusion  J90 Lactate dehydrogenase (pleural or peritoneal fluid)    Lactate dehydrogenase (pleural or peritoneal fluid)    Protein, pleural or peritoneal fluid    Protein, pleural or peritoneal fluid    Glucose, pleural or peritoneal fluid    Glucose, pleural or  peritoneal fluid    Miscellaneous [facility] test (send-out)    Miscellaneous [facility] test (send-out)    Body fluid culture w Gram Stain    Body fluid culture w Gram Stain    CANCELED: Gram stain    CANCELED: Gram stain    2. Hypoxia  R09.02       ED Discharge Orders     None       Discharge Instructions   None        Sloan Leiter, DO 09/29/23 1948

## 2023-09-29 NOTE — ED Provider Notes (Signed)
 [place] EMERGENCY DEPARTMENT AT [facility] Provider Note   CSN: [id number] Arrival date & time: 09/29/23  1412     History  Chief Complaint  Patient presents with   Shortness of Breath    Chad [name] is a 78 y.o. male.  Patient is a 78 year old male with a history of hypertension, hyperlipidemia, chronic kidney disease, diabetes who presents with shortness of breath.  He had a recent bypass surgery/aortic valve replacement at [facility] in January of this year.  He states he had a little bit of fluid on his lung when he left the hospital but that it was anticipated that this would resolve.  Over the last couple weeks he has had some progressive shortness of breath and fatigue.  He has had a little bit of a nonproductive cough.  No known fevers.  No associated chest pain.  He had some leg swelling previously but says it is overall improved.  He is not on anticoagulants.  He is not on home oxygen.       Home Medications Prior to Admission medications   Medication Sig Start Date End Date Taking? Authorizing Provider  amiodarone (PACERONE) 400 MG tablet TAKE 1 TABLET BY MOUTH TWICE A DAY UNTIL 08/29/23, THEN REDUCE TO 1 TABLET BY MOUTH DAILY 08/23/23   Georgie Chard D, NP  amLODipine (NORVASC) 10 MG tablet Take 1 tablet (10 mg total) by mouth daily. 09/10/23   Wendall Stade, MD  aspirin 81 MG chewable tablet Chew 81 mg by mouth daily.    [provider]  B-D ULTRAFINE III SHORT PEN 31G X 8 MM MISC SMARTSIG:1 Each SUB-Q Daily 04/25/21   [provider]  furosemide (LASIX) 20 MG tablet Take 1 tablet (20 mg total) by mouth daily. Patient not taking: Reported on 09/14/2023 08/24/23 08/23/24  Georgie Chard D, NP  insulin glargine (LANTUS) 100 UNIT/ML injection Inject 15 Units into the skin daily.    [provider]  insulin lispro (HUMALOG) 100 UNIT/ML injection Inject 15 Units into the skin daily. Patient not taking: Reported on 09/14/2023    [provider]  JARDIANCE 10 MG TABS tablet Take 10 mg by mouth daily. 11/11/20   [provider]  levothyroxine (SYNTHROID, LEVOTHROID) 75 MCG tablet Take 75 mcg by mouth daily before breakfast.  12/28/16   [provider]  metoprolol tartrate (LOPRESSOR) 25 MG tablet Take 12.5 mg by mouth 2 (two) times daily. 08/21/23 08/20/24  [provider]  mexiletine (MEXITIL) 200 MG capsule TAKE 1 TABLET TWICE A DAY UNTIL 08/29/23 THEN YOU CAN STOP IT Patient not taking: Reported on 09/14/2023 08/23/23   Filbert Schilder, NP  [facility] VERIO test strip  04/24/21   [provider]  polyethylene glycol (MIRALAX / GLYCOLAX) 17 g packet Take 17 g by mouth every other day. 08/21/23   [provider]  rosuvastatin (CRESTOR) 20 MG tablet Take 20 mg by mouth daily.    [provider]  mexiletine (MEXITIL) 200 MG capsule Take 200 mg by mouth every 8 (eight) hours. Patient not taking: Reported on 09/14/2023 08/21/23 08/20/24  [provider]      Allergies    Patient has no known allergies.    Review of Systems   Review of Systems  Constitutional:  Positive for fatigue. Negative for chills, diaphoresis and fever.  HENT:  Negative for congestion, rhinorrhea and sneezing.   Eyes: Negative.   Respiratory:  Positive for cough and shortness of  breath. Negative for chest tightness.   Cardiovascular:  Positive for leg swelling. Negative for chest pain.  Gastrointestinal:  Negative for abdominal pain, blood in stool, diarrhea, nausea and vomiting.  Genitourinary:  Negative for difficulty urinating, flank pain, frequency and hematuria.  Musculoskeletal:  Negative for arthralgias and back pain.  Skin:  Negative for rash.  Neurological:  Negative for dizziness, speech difficulty, weakness, numbness and headaches.    Physical Exam Updated Vital Signs BP (!) 145/59 (BP Location: Right Arm)   Pulse 64   Temp (!) 97.5 F (36.4 C)   Resp 17   Ht 5\' 8"  (1.727 m)   Wt  81.2 kg   SpO2 93%   BMI 27.22 kg/m  Physical Exam Constitutional:      Appearance: He is well-developed.  HENT:     Head: Normocephalic and atraumatic.  Eyes:     Pupils: Pupils are equal, round, and reactive to light.  Cardiovascular:     Rate and Rhythm: Normal rate and regular rhythm.     Heart sounds: No murmur heard. Pulmonary:     Effort: Pulmonary effort is normal. No respiratory distress.     Breath sounds: Normal breath sounds. No wheezing or rales.     Comments: Mild tachypnea, decreased breath sounds in the left lung fields Chest:     Chest wall: No tenderness.  Abdominal:     General: Bowel sounds are normal.     Palpations: Abdomen is soft.     Tenderness: There is no abdominal tenderness. There is no guarding or rebound.  Musculoskeletal:        General: Normal range of motion.     Cervical back: Normal range of motion and neck supple.     Comments: 2+ pitting edema to lower extremities bilaterally  Lymphadenopathy:     Cervical: No cervical adenopathy.  Skin:    General: Skin is warm and dry.     Findings: No rash.  Neurological:     Mental Status: He is alert and oriented to person, place, and time.     ED Results / Procedures / Treatments   Labs (all labs ordered are listed, but only abnormal results are displayed) Labs Reviewed  BASIC METABOLIC PANEL  CBC WITH DIFFERENTIAL/PLATELET    EKG EKG Interpretation Date/Time:  Wednesday September 29 2023 14:32:29 EST Ventricular Rate:  66 PR Interval:  194 QRS Duration:  82 QT Interval:  412 QTC Calculation: 431 R Axis:   75  Text Interpretation: Normal sinus rhythm Cannot rule out Anterior infarct , age undetermined Abnormal ECG When compared with ECG of 23-Aug-2023 15:24, PREVIOUS ECG IS PRESENT since last tracing no significant change Confirmed by Rolan Bucco [phone]) on 09/29/2023 2:52:13 PM  Radiology No results found.  Procedures Procedures    Medications Ordered in ED Medications - No  data to display  ED Course/ Medical Decision Making/ A&P Clinical Course as of 09/29/23 1628  Wed Sep 29, 2023  1620 Received from [facility] Large L pleural eff S/p cabg/ aortic graft at [facility] Worsening effusion on imaging Now hypoxic, 4LNC  Labs/imaging formal read pending IR to eval for thora Needs admit   [SG]    Clinical Course User Index [SG] Sloan Leiter, DO                                 Medical Decision Making Amount and/or Complexity of Data Reviewed Labs: ordered.  Radiology: ordered.   Patient is a 78 year old who presents with worsening shortness of breath.  He was initially on 2 L/min of oxygen but I had to bump him up to 4 L due to hypoxia.  He has some mild tachypnea but no other increased work of breathing.  Chest x-ray was interpreted by me to show large left pleural effusion.  He is afebrile.  Will check a CT scan to rule out underlying process.  I did speak with Trish with cardiology to see if cardiology wanted to be involved and she advises that the medicine team can consult if needed.  I spoke with Dr. Loreta Ave with IR who advised to put an order in for the US thoracentesis and they will see if there is availability for today.  However we are still awaiting his labs and imaging study results.. Care turned over to Dr. Gwenette Greet pending results.  Will need admission to the medicine service.  Final Clinical Impression(s) / ED Diagnoses Final diagnoses:  Pleural effusion    Rx / DC Orders ED Discharge Orders     None         Rolan Bucco, MD 09/29/23 [phone]

## 2023-09-29 NOTE — Telephone Encounter (Signed)
 Per Dr. Eden Emms, Would have him go to ER and arrange left IR thoracentesis.  Patient called back. Informed him of Dr. Fabio Bering advisement and he is headed to ER.

## 2023-09-29 NOTE — Procedures (Signed)
 PROCEDURE SUMMARY:  Successful US guided left thoracentesis. Yielded 1.9 L of amber fluid. Patient tolerated procedure well. No immediate complications. EBL = trace  Specimen was sent for labs.  Post procedure chest X-ray pending.  Zeven Kocak S Vandy Tsuchiya PA-C 09/29/2023 4:21 PM

## 2023-09-29 NOTE — ED Notes (Signed)
 Patient to Thoracentesis

## 2023-09-30 ENCOUNTER — Other Ambulatory Visit (HOSPITAL_BASED_OUTPATIENT_CLINIC_OR_DEPARTMENT_OTHER): Payer: PPO

## 2023-09-30 ENCOUNTER — Encounter: Payer: Self-pay | Admitting: Cardiovascular Disease

## 2023-09-30 ENCOUNTER — Observation Stay (HOSPITAL_COMMUNITY)

## 2023-09-30 DIAGNOSIS — J90 Pleural effusion, not elsewhere classified: Principal | ICD-10-CM | POA: Insufficient documentation

## 2023-09-30 DIAGNOSIS — I129 Hypertensive chronic kidney disease with stage 1 through stage 4 chronic kidney disease, or unspecified chronic kidney disease: Secondary | ICD-10-CM | POA: Diagnosis not present

## 2023-09-30 DIAGNOSIS — I2510 Atherosclerotic heart disease of native coronary artery without angina pectoris: Secondary | ICD-10-CM | POA: Diagnosis not present

## 2023-09-30 DIAGNOSIS — K219 Gastro-esophageal reflux disease without esophagitis: Secondary | ICD-10-CM | POA: Diagnosis not present

## 2023-09-30 DIAGNOSIS — D649 Anemia, unspecified: Secondary | ICD-10-CM | POA: Diagnosis not present

## 2023-09-30 DIAGNOSIS — Y838 Other surgical procedures as the cause of abnormal reaction of the patient, or of later complication, without mention of misadventure at the time of the procedure: Secondary | ICD-10-CM | POA: Diagnosis present

## 2023-09-30 DIAGNOSIS — R946 Abnormal results of thyroid function studies: Secondary | ICD-10-CM | POA: Diagnosis not present

## 2023-09-30 DIAGNOSIS — R0609 Other forms of dyspnea: Secondary | ICD-10-CM | POA: Diagnosis not present

## 2023-09-30 DIAGNOSIS — I480 Paroxysmal atrial fibrillation: Secondary | ICD-10-CM | POA: Diagnosis not present

## 2023-09-30 DIAGNOSIS — E11319 Type 2 diabetes mellitus with unspecified diabetic retinopathy without macular edema: Secondary | ICD-10-CM | POA: Diagnosis not present

## 2023-09-30 DIAGNOSIS — E1142 Type 2 diabetes mellitus with diabetic polyneuropathy: Secondary | ICD-10-CM | POA: Diagnosis not present

## 2023-09-30 DIAGNOSIS — E8809 Other disorders of plasma-protein metabolism, not elsewhere classified: Secondary | ICD-10-CM | POA: Diagnosis not present

## 2023-09-30 DIAGNOSIS — Z7984 Long term (current) use of oral hypoglycemic drugs: Secondary | ICD-10-CM | POA: Diagnosis not present

## 2023-09-30 DIAGNOSIS — J189 Pneumonia, unspecified organism: Secondary | ICD-10-CM | POA: Diagnosis not present

## 2023-09-30 DIAGNOSIS — E039 Hypothyroidism, unspecified: Secondary | ICD-10-CM

## 2023-09-30 DIAGNOSIS — Z951 Presence of aortocoronary bypass graft: Secondary | ICD-10-CM

## 2023-09-30 DIAGNOSIS — E872 Acidosis, unspecified: Secondary | ICD-10-CM | POA: Diagnosis not present

## 2023-09-30 DIAGNOSIS — E1122 Type 2 diabetes mellitus with diabetic chronic kidney disease: Secondary | ICD-10-CM | POA: Diagnosis not present

## 2023-09-30 DIAGNOSIS — D696 Thrombocytopenia, unspecified: Secondary | ICD-10-CM | POA: Diagnosis not present

## 2023-09-30 DIAGNOSIS — J9589 Other postprocedural complications and disorders of respiratory system, not elsewhere classified: Secondary | ICD-10-CM | POA: Diagnosis not present

## 2023-09-30 DIAGNOSIS — I10 Essential (primary) hypertension: Secondary | ICD-10-CM

## 2023-09-30 DIAGNOSIS — Z952 Presence of prosthetic heart valve: Secondary | ICD-10-CM | POA: Diagnosis not present

## 2023-09-30 DIAGNOSIS — E7800 Pure hypercholesterolemia, unspecified: Secondary | ICD-10-CM | POA: Diagnosis not present

## 2023-09-30 DIAGNOSIS — Z79899 Other long term (current) drug therapy: Secondary | ICD-10-CM | POA: Diagnosis not present

## 2023-09-30 DIAGNOSIS — N184 Chronic kidney disease, stage 4 (severe): Secondary | ICD-10-CM | POA: Diagnosis not present

## 2023-09-30 DIAGNOSIS — R0602 Shortness of breath: Secondary | ICD-10-CM | POA: Diagnosis not present

## 2023-09-30 DIAGNOSIS — R918 Other nonspecific abnormal finding of lung field: Secondary | ICD-10-CM | POA: Diagnosis not present

## 2023-09-30 DIAGNOSIS — D631 Anemia in chronic kidney disease: Secondary | ICD-10-CM | POA: Diagnosis not present

## 2023-09-30 DIAGNOSIS — J9601 Acute respiratory failure with hypoxia: Secondary | ICD-10-CM | POA: Diagnosis not present

## 2023-09-30 DIAGNOSIS — Z79890 Hormone replacement therapy (postmenopausal): Secondary | ICD-10-CM | POA: Diagnosis not present

## 2023-09-30 DIAGNOSIS — Z794 Long term (current) use of insulin: Secondary | ICD-10-CM | POA: Diagnosis not present

## 2023-09-30 LAB — COMPREHENSIVE METABOLIC PANEL
ALT: 31 U/L (ref 0–44)
AST: 20 U/L (ref 15–41)
Albumin: 2.1 g/dL — ABNORMAL LOW (ref 3.5–5.0)
Alkaline Phosphatase: 66 U/L (ref 38–126)
Anion gap: 9 (ref 5–15)
BUN: 54 mg/dL — ABNORMAL HIGH (ref 8–23)
CO2: 19 mmol/L — ABNORMAL LOW (ref 22–32)
Calcium: 8.8 mg/dL — ABNORMAL LOW (ref 8.9–10.3)
Chloride: 107 mmol/L (ref 98–111)
Creatinine, Ser: 3.66 mg/dL — ABNORMAL HIGH (ref 0.61–1.24)
GFR, Estimated: 16 mL/min — ABNORMAL LOW (ref >60)
Glucose, Bld: 184 mg/dL — ABNORMAL HIGH (ref 70–99)
Potassium: 4 mmol/L (ref 3.5–5.1)
Sodium: 135 mmol/L (ref 135–145)
Total Bilirubin: 0.7 mg/dL (ref 0.0–1.2)
Total Protein: 5 g/dL — ABNORMAL LOW (ref 6.5–8.1)

## 2023-09-30 LAB — GLUCOSE, CAPILLARY
Glucose-Capillary: 177 mg/dL — ABNORMAL HIGH (ref 70–99)
Glucose-Capillary: 268 mg/dL — ABNORMAL HIGH (ref 70–99)
Glucose-Capillary: 148 mg/dL — ABNORMAL HIGH (ref 70–99)
Glucose-Capillary: 183 mg/dL — ABNORMAL HIGH (ref 70–99)

## 2023-09-30 LAB — FOLATE: Folate: 14.6 ng/mL (ref >5.9)

## 2023-09-30 LAB — CBC
HCT: 26.5 % — ABNORMAL LOW (ref 39.0–52.0)
Hemoglobin: 8.8 g/dL — ABNORMAL LOW (ref 13.0–17.0)
MCH: 28.9 pg (ref 26.0–34.0)
MCHC: 33.2 g/dL (ref 30.0–36.0)
MCV: 87.2 fL (ref 80.0–100.0)
Platelets: 102 10*3/uL — ABNORMAL LOW (ref 150–400)
RBC: 3.04 MIL/uL — ABNORMAL LOW (ref 4.22–5.81)
RDW: 15.5 % (ref 11.5–15.5)
WBC: 7.2 10*3/uL (ref 4.0–10.5)
nRBC: 0 % (ref 0.0–0.2)

## 2023-09-30 LAB — IRON AND TIBC
Iron: 35 ug/dL — ABNORMAL LOW (ref 45–182)
Saturation Ratios: 15 % — ABNORMAL LOW (ref 17.9–39.5)
TIBC: 231 ug/dL — ABNORMAL LOW (ref 250–450)
UIBC: 196 ug/dL

## 2023-09-30 LAB — RETICULOCYTES
Immature Retic Fract: 26.3 % — ABNORMAL HIGH (ref 2.3–15.9)
RBC.: 3.02 MIL/uL — ABNORMAL LOW (ref 4.22–5.81)
Retic Count, Absolute: 125.6 10*3/uL (ref 19.0–186.0)
Retic Ct Pct: 4.2 % — ABNORMAL HIGH (ref 0.4–3.1)

## 2023-09-30 LAB — VITAMIN B12: Vitamin B-12: 728 pg/mL (ref 180–914)

## 2023-09-30 LAB — FERRITIN: Ferritin: 201 ng/mL (ref 24–336)

## 2023-09-30 LAB — TSH: TSH: 21.123 u[IU]/mL — ABNORMAL HIGH (ref 0.350–4.500)

## 2023-09-30 MED ORDER — ENOXAPARIN SODIUM 30 MG/0.3ML IJ SOSY
30.0000 mg | PREFILLED_SYRINGE | INTRAMUSCULAR | Status: DC
  Administered 2023-09-30 – 2023-10-01 (×2): 30 mg via SUBCUTANEOUS
  Filled 2023-09-30 (×2): qty 0.3

## 2023-09-30 MED ORDER — LEVALBUTEROL HCL 0.63 MG/3ML IN NEBU
0.6300 mg | INHALATION_SOLUTION | Freq: Four times a day (QID) | RESPIRATORY_TRACT | Status: DC
  Administered 2023-09-30 – 2023-10-01 (×4): 0.63 mg via RESPIRATORY_TRACT
  Filled 2023-09-30 (×5): qty 3

## 2023-09-30 MED ORDER — IPRATROPIUM BROMIDE 0.02 % IN SOLN
0.5000 mg | Freq: Four times a day (QID) | RESPIRATORY_TRACT | Status: DC
  Administered 2023-09-30 – 2023-10-01 (×4): 0.5 mg via RESPIRATORY_TRACT
  Filled 2023-09-30 (×5): qty 2.5

## 2023-09-30 MED ORDER — SODIUM CHLORIDE 0.9 % IV SOLN: 1.0000 g | Freq: Once | INTRAVENOUS | Status: DC

## 2023-09-30 MED ORDER — LEVOTHYROXINE SODIUM 100 MCG PO TABS
100.0000 ug | ORAL_TABLET | Freq: Every day | ORAL | Status: DC
  Administered 2023-10-01: 100 ug via ORAL
  Filled 2023-09-30: qty 1

## 2023-09-30 MED ORDER — SODIUM CHLORIDE 0.9 % IV SOLN: 500.0000 mg | INTRAVENOUS | Status: DC

## 2023-09-30 MED ORDER — SODIUM CHLORIDE 0.9 % IV SOLN: 500.0000 mg | Freq: Once | INTRAVENOUS | Status: DC

## 2023-09-30 MED ORDER — SODIUM CHLORIDE 0.9 % IV SOLN
1.0000 g | INTRAVENOUS | Status: DC
  Administered 2023-09-30: 1 g via INTRAVENOUS
  Filled 2023-09-30: qty 10

## 2023-09-30 MED ORDER — AZITHROMYCIN 500 MG PO TABS
500.0000 mg | ORAL_TABLET | Freq: Every day | ORAL | Status: DC
Start: 2023-09-30 — End: 2023-10-02
  Administered 2023-09-30 – 2023-10-01 (×2): 500 mg via ORAL
  Filled 2023-09-30 (×2): qty 1

## 2023-09-30 MED ORDER — GUAIFENESIN ER 600 MG PO TB12
1200.0000 mg | ORAL_TABLET | Freq: Two times a day (BID) | ORAL | Status: DC
  Administered 2023-09-30 – 2023-10-01 (×2): 1200 mg via ORAL
  Filled 2023-09-30 (×2): qty 2

## 2023-09-30 NOTE — Hospital Course (Addendum)
 Patient is a 78 year old Caucasian male with past medical history significant for Mannam to CAD status post aortic valve replacement and recent CABG at [facility] in January 2025, diabetes mellitus type 2, chronic kidney disease stage IV, anemia of kidney disease as well as other comorbidities who presented worsening shortness of breath and dyspnea and was found to have a very large left-sided pleural effusion which she underwent a thoracentesis for.  Subsequently chest x-ray now shows a postobstructive pneumonia and has been placed on antibiotics.  Patient does not wear oxygen at home at baseline but desaturated and needs oxygen for discharge.  Repeat chest x-ray appears stable.  He is medically stable to be discharged at this time and follow-up with PCP, cardiology and cardiothoracic surgery and if necessary referral to pulmonary in outpatient setting.  Referrals been made to hematologist given his anemia and thrombocytopenia.  Assessment and Plan:  Acute respiratory failure with hypoxia presently on 2 L oxygen likely related to patient's pleural effusion post CABG.  Thoracentesis was done.  Follow-up pleural fluid labs.  CT scan also showed rib fractures involving the left first and second. Dr. Toniann Fail discussed with on-call general surgery team no further intervention was needed as were likely related to his recent CABG. see treatment as below and continue to try to wean his oxygen. Based on Lights Criteria Effusion is Exudative.  Patient desaturate on ambulatory home O2 screen and will need to be weaned in the outpatient setting and will be sent home with supplemental oxygen.  Advising repeat chest x-ray in 3 to 6 weeks and if not improving after weaning will need pulmonary referral  Community Acquired Pneumonia with CT scan showing right-sided lung infiltrates and patient having persistent cough. C/w Azithromycin and Ceftriaxone while hospitalized and then changed to oral  antibiotics and discharged with cefdinir and azithromycin. CXR done yesterday and showed a worsening and increasing basilar opacity concerning for pneumonia or atelectasis with possible small pleural effusion on the left and the right lung was unremarkable.   -We have started Xopenex and Atrovent scheduled and will also add Brovana budesonide in addition to guaifenesin 1200 mL p.o. twice daily, flutter valve and incentive spirometry.   -CXR done and showed stable cardiomegaly as well as no overt pulmonary edema and a small stable left pleural sided effusion and minimal patchy left lung base opacity.  Repeat chest x-ray in 3 to 6 weeks and follow-up with primary.  Diabetes Mellitus Type 2: Takes Lantus 40 units at bedtime but now on 14 units. On sliding scale coverage with Sensitive Novolog SSI AC and C/w Empagliflozin 10 mg po Daily  Hypothyroidism: TSH was 21.123 and will increase his levothyroxine to 100 mcg from 75 mcg.  He will need repeat TFTs in 4 to 6 weeks   Chronic kidney disease stage IV / Metabolic Acidosis: Creatinine improved from recent past.Current BUN/Cr Trend: Recent Labs  Lab 09/29/23 1830 09/30/23 0731 10/01/23 0734  BUN 60* 54* 52*  CREATININE 3.89* 3.66* 3.61*  -Has a Slight metabolic acidosis with a CO2 of 19, anion gap of 8, chloride level 104 -Avoid Nephrotoxic Medications, Contrast Dyes, Hypotension and Dehydration to Ensure Adequate Renal Perfusion and will need to Renally Adjust Meds -Continue to Monitor and Trend Renal Function carefully and repeat CMP within 1 week  Chronic anemia likely from renal disease: Hemoglobin/hematocrit 9 from 10.1/30.6 is now 8.8/26.5.  Check anemia panel which showed an iron level of 35, UIBC 196, TIBC 231, saturation ratios of 15%,  ferritin level 201, folate of 14.6 and vitamin B12 728.  Transfused IV iron this a.m.  Continue to monitor for signs and symptoms of bleeding; no overt bleeding noted and will need follow-up with  hematology  Thrombocytopenia: Platelet Count went from 116 -> 102 and is now 90. CTM for S/Sx of Bleeding; No overt bleeding noted. Repeat CBC within 1 week and will arrange outpatient follow-up with hematology for further evaluation  Recent CAD status post CABG and AVR at [facility] in January 2025.  C/w Metoprolol Tartrate 12.5 mg po BID, Rosuvastatin 20 mg po Daily, and ASA 81 mg po Daily and Monitor for any CP and C/w Empagliflozain at discharge -Repeat echo done and showed an EF of 60 to 65% and showed "Post AVR with hybrid 25 mm Liva Nova Perceval rapid deployment valve  normal gradients and no PVL"  Hypertension: C/w Amlodipine 10 mg po Daily and Metoprolol Tartrate 12.5 mg po BID  HLD: C/w Rousvastatin 20 mg po Daily.  Hypoalbuminemia: Patient's Albumin is now 2.1 given. CTM and Trend and repeat CMP w/in 1 week

## 2023-09-30 NOTE — Plan of Care (Signed)
  Problem: Education: Goal: Knowledge of General Education information will improve Description: Including pain rating scale, medication(s)/side effects and non-pharmacologic comfort measures Outcome: Progressing   Problem: Health Behavior/Discharge Planning: Goal: Ability to manage health-related needs will improve Outcome: Progressing   Problem: Clinical Measurements: Goal: Will remain free from infection Outcome: Progressing   Problem: Activity: Goal: Risk for activity intolerance will decrease Outcome: Progressing   Problem: Nutrition: Goal: Adequate nutrition will be maintained Outcome: Progressing   Problem: Coping: Goal: Level of anxiety will decrease Outcome: Progressing   Problem: Elimination: Goal: Will not experience complications related to bowel motility Outcome: Progressing Goal: Will not experience complications related to urinary retention Outcome: Progressing   Problem: Safety: Goal: Ability to remain free from injury will improve Outcome: Progressing   Problem: Skin Integrity: Goal: Risk for impaired skin integrity will decrease Outcome: Progressing

## 2023-09-30 NOTE — Progress Notes (Signed)
 Mobility Specialist: Progress Note   09/30/23 1623  Mobility  Activity Ambulated with assistance in hallway  Level of Assistance Standby assist, set-up cues, supervision of patient - no hands on  Assistive Device Front wheel walker  Distance Ambulated (ft) 1000 ft  Activity Response Tolerated well  Mobility Referral Yes  Mobility visit 1 Mobility  Mobility Specialist Start Time (ACUTE ONLY) 1540  Mobility Specialist Stop Time (ACUTE ONLY) 1603  Mobility Specialist Time Calculation (min) (ACUTE ONLY) 23 min    Pt requesting mobility - received in bed. SV throughout. No complaints. VSS on 4LO2. Returned to room without fault. Left in bed with all needs met, call bell in reach. Wife in room.   Maurene Capes Mobility Specialist Please contact via SecureChat or Rehab office at [phone]

## 2023-09-30 NOTE — Progress Notes (Signed)
 Incentive spirometry and flutter valve introduced with teach back.

## 2023-09-30 NOTE — Progress Notes (Signed)
 Patient ambulated along the hallway by the CNA with 3L O2 via [place] . Patient developed SOB and SPO2 was 88%.  RN kept the patient at rest and increased O2. checked the vitals after few minutes at rest. SPO2 is 94% with 4L [place]. Will continue to monitor

## 2023-09-30 NOTE — Progress Notes (Signed)
 SCD placed for B/L lower extrimities.

## 2023-09-30 NOTE — Progress Notes (Signed)
 PROGRESS NOTE    Chad Chad [name]  ZOX:[id number] DOB: [birth year] DOA: 09/29/2023 PCP: Chad Dills, MD   Brief Narrative:  Chad [name] is a 78 year old Caucasian male with past medical history significant for Mannam to CAD status post aortic valve replacement and recent CABG at [facility] in January 2025, diabetes mellitus type 2, chronic kidney disease stage IV, anemia of kidney disease as well as other comorbidities who presented worsening shortness of breath and dyspnea and was found to have a very large left-sided pleural effusion which she underwent a thoracentesis for.  Subsequently chest x-ray now shows a postobstructive pneumonia and has been placed on antibiotics.  Chad [name] does not wear oxygen at home at baseline and oxygen is being weaned.  Assessment and Plan:  Acute respiratory failure with hypoxia presently on 2 L oxygen likely related to Chad [name]'s pleural effusion post CABG.  Thoracentesis was done.  Follow-up pleural fluid labs.  CT scan also showed rib fractures involving the left first and second. Dr. Toniann [name] discussed with on-call general surgery team no further intervention was needed as were likely related to his recent CABG. see treatment as below and continue to try to wean his oxygen. Based on Lights Criteria Effusion is Exudative. Will need an Ambulatory home O2 screen repeat chest x-ray in the a.m.  Community Acquired Pneumonia with CT scan showing right-sided lung infiltrates and Chad [name] having persistent cough. C/w Azithromycin and Ceftriaxone. Repeat CXR this AM done and showed a worsening and increasing basilar opacity concerning for pneumonia or atelectasis with possible small pleural effusion on the left and the right lung was unremarkable.  We have started Xopenex and Atrovent scheduled and will also add Brovana budesonide in addition to guaifenesin 1200 mL p.o. twice daily, flutter valve and incentive spirometry.  Will need repeat chest x-ray in the  a.m.  Diabetes Mellitus Type 2: Takes Lantus 40 units at bedtime but now on 14 units. On sliding scale coverage with Sensitive Novolog SSI AC and C/w Empagliflozin 10 mg po Daily  Hypothyroidism: TSH was 21.123 and will increase his levothyroxine to 100 mcg from 75 mcg.  He will need repeat TFTs in 4 to 6 weeks   Chronic kidney disease stage IV / Metabolic Acidosis: Creatinine improved from recent past.Current BUN/Cr Trend: Recent Labs  Lab 09/29/23 1830 09/30/23 0731  BUN 60* 54*  CREATININE 3.89* 3.66*  -Has a Slight metabolic acidosis with a CO2 of 19, anion gap of 9, chloride level 107 -Avoid Nephrotoxic Medications, Contrast Dyes, Hypotension and Dehydration to Ensure Adequate Renal Perfusion and will need to Renally Adjust Meds -Continue to Monitor and Trend Renal Function carefully and repeat CMP in the AM   Chronic anemia likely from renal disease: Hemoglobin/hematocrit 9 from 10.1/30.6 is now 8.8/26.5.  Check anemia panel which showed an iron level of 35, UIBC 196, TIBC 231, saturation ratios of 15%, ferritin level 201, folate of 14.6 and vitamin B12 728.  Will likely transfuse IV iron in the morning if blood cultures show no growth to continue to monitor for signs and symptoms bleeding; no overt bleeding noted  Thrombocytopenia: Platelet Count went from 116 -> 102. CTM for S/Sx of Bleeding; No overt bleeding noted. Repeat CBC in the AM  Recent CAD status post CABG and AVR at [facility] in January 2025.  C/w Metoprolol Tartrate 12.5 mg po BID, Rosuvastatin 20 mg po Daily, and ASA 81 mg po Daily and Monitor for any CP and C/w Empagliflozain  Hypertension: C/w Amlodipine 10  mg po Daily and Metoprolol Tartrate 12.5 mg po BID  HLD: C/w Rousvastatin 20 mg po Daily.  Hypoalbuminemia: Chad [name]'s Albumin is now 2.1. CTM and Trend and repeat CMP in the AM   DVT prophylaxis: enoxaparin (LOVENOX) injection 30 mg Start: 09/30/23 1000 SCDs Start: 09/29/23 2336    Code Status: Full  Code Family Communication: Discussed with the Chad [name]'s wife at bedside discussed with the Chad [name]'s son Dr. Rolena Chad [name] over the telephone  Disposition Plan:  Level of care: Telemetry Medical Status is: Inpatient Remains inpatient appropriate because: Needs further clinical improvement in his respiratory status and attempting weaning of his oxygen   Consultants:  None  Procedures:  As delineated above  Antimicrobials:  Anti-infectives (From admission, onward)    Start     Dose/Rate Route Frequency Ordered Stop   09/30/23 2000  cefTRIAXone (ROCEPHIN) 1 g in sodium chloride 0.9 % 100 mL IVPB        1 g 200 mL/hr over 30 Minutes Intravenous Every 24 hours 09/30/23 0957     09/30/23 2000  azithromycin (ZITHROMAX) 500 mg in sodium chloride 0.9 % 250 mL IVPB  Status:  Discontinued        500 mg 250 mL/hr over 60 Minutes Intravenous Every 24 hours 09/30/23 0957 09/30/23 1109   09/30/23 1200  azithromycin (ZITHROMAX) tablet 500 mg        500 mg Oral Daily 09/30/23 1109     09/30/23 1045  azithromycin (ZITHROMAX) 500 mg in sodium chloride 0.9 % 250 mL IVPB  Status:  Discontinued        500 mg 250 mL/hr over 60 Minutes Intravenous  Once 09/30/23 0953 09/30/23 0957   09/30/23 1045  cefTRIAXone (ROCEPHIN) 1 g in sodium chloride 0.9 % 100 mL IVPB  Status:  Discontinued        1 g 200 mL/hr over 30 Minutes Intravenous  Once 09/30/23 0953 09/30/23 0957   09/29/23 2000  cefTRIAXone (ROCEPHIN) 1 g in sodium chloride 0.9 % 100 mL IVPB        1 g 200 mL/hr over 30 Minutes Intravenous  Once 09/29/23 1946 09/29/23 2123   09/29/23 2000  azithromycin (ZITHROMAX) 500 mg in sodium chloride 0.9 % 250 mL IVPB        500 mg 250 mL/hr over 60 Minutes Intravenous  Once 09/29/23 1946 09/29/23 2244       Subjective: Seen and examined at bedside and thinks his respiratory status is doing okay.  Still wearing supplemental oxygen via nasal cannula.  No nausea or vomiting.  Denied any lightheadedness or dizziness.   Wanting to walk.  No other concerns or complaints at the time.  Objective: Vitals:   09/30/23 1230 09/30/23 1238 09/30/23 1735 09/30/23 1808  BP:  (!) 107/58 (!) 116/54   Pulse:  (!) 58 (!) 58 69  Resp:   18   Temp:      TempSrc:      SpO2: (!) 88% 94% 92% (!) 87%  Weight:      Height:        Intake/Output Summary (Last 24 hours) at 09/30/2023 1933 Last data filed at 09/30/2023 1030 Gross per 24 hour  Intake 250 ml  Output 801 ml  Net -551 ml   Filed Weights   09/29/23 1429  Weight: 81.2 kg   Examination: Physical Exam:  Constitutional: WN/WD overweight elderly Caucasian male in no acute distress Respiratory: Diminished to auscultation bilaterally with some coarse breath sounds at the bases worse  on the left compared to the right and does have some slight rhonchi and mild crackles but no appreciable wheezing or rales. Normal respiratory effort and Chad [name] is not tachypenic. No accessory muscle use.  Wearing supplemental oxygen via nasal cannula Cardiovascular: RRR, no murmurs / rubs / gallops. S1 and S2 auscultated. No appreciable extremity edema.  Abdomen: Soft, non-tender, distended secondary body habitus. Bowel sounds positive.  GU: Deferred. Musculoskeletal: No clubbing / cyanosis of digits/nails. No joint deformity upper and lower extremities. Skin: No rashes, lesions, ulcers on a limited skin evaluation. No induration; Warm and dry.  Neurologic: CN 2-12 grossly intact with no focal deficits. Romberg sign and cerebellar reflexes not assessed.  Psychiatric: Normal judgment and insight. Alert and oriented x 3. Normal mood and appropriate affect.   Data Reviewed: I have personally reviewed following labs and imaging studies  CBC: Recent Labs  Lab 09/29/23 1830 09/30/23 0731  WBC 7.8 7.2  NEUTROABS 6.1  --   HGB 10.1* 8.8*  HCT 30.6* 26.5*  MCV 88.2 87.2  PLT 116* 102*   Basic Metabolic Panel: Recent Labs  Lab 09/29/23 1830 09/30/23 0731  NA 133* 135  K 4.9 4.0   CL 101 107  CO2 21* 19*  GLUCOSE 295* 184*  BUN 60* 54*  CREATININE 3.89* 3.66*  CALCIUM 9.7 8.8*   GFR: Estimated Creatinine Clearance: 16.6 mL/min (A) (by C-G formula based on SCr of 3.66 mg/dL (H)). Liver Function Tests: Recent Labs  Lab 09/30/23 0731  AST 20  ALT 31  ALKPHOS 66  BILITOT 0.7  PROT 5.0*  ALBUMIN 2.1*   No results for input(s): "LIPASE", "AMYLASE" in the last 168 hours. No results for input(s): "AMMONIA" in the last 168 hours. Coagulation Profile: No results for input(s): "INR", "PROTIME" in the last 168 hours. Cardiac Enzymes: No results for input(s): "CKTOTAL", "CKMB", "CKMBINDEX", "TROPONINI" in the last 168 hours. BNP (last 3 results) No results for input(s): "PROBNP" in the last 8760 hours. HbA1C: No results for input(s): "HGBA1C" in the last 72 hours. CBG: Recent Labs  Lab 09/29/23 2336 09/30/23 0814 09/30/23 1138 09/30/23 1734  GLUCAP 243* 177* 268* 148*   Lipid Profile: No results for input(s): "CHOL", "HDL", "LDLCALC", "TRIG", "CHOLHDL", "LDLDIRECT" in the last 72 hours. Thyroid Function Tests: Recent Labs    09/30/23 0731  TSH 21.123*   Anemia Panel: Recent Labs    09/30/23 1043  VITAMINB12 728  FOLATE 14.6  FERRITIN 201  TIBC 231*  IRON 35*  RETICCTPCT 4.2*   Sepsis Labs: No results for input(s): "PROCALCITON", "LATICACIDVEN" in the last 168 hours.  Recent Results (from the past 240 hours)  Body fluid culture w Gram Stain     Status: None (Preliminary result)   Collection Time: 09/29/23  4:33 PM   Specimen: Abdomen; Peritoneal Fluid  Result Value Ref Range Status   Specimen Description PERITONEAL  Final   Special Requests NONE  Final   Gram Stain   Final    RARE WBC PRESENT, PREDOMINANTLY MONONUCLEAR NO ORGANISMS SEEN    Culture   Final    NO GROWTH < 24 HOURS Performed at [facility], [address], [place], [place] [address]    Report Status PENDING  Incomplete  Culture, blood (Routine X 2) w Reflex  to ID Panel     Status: None (Preliminary result)   Collection Time: 09/30/23 10:34 AM   Specimen: BLOOD RIGHT HAND  Result Value Ref Range Status   Specimen Description BLOOD  RIGHT HAND  Final   Special Requests   Final    BOTTLES DRAWN AEROBIC AND ANAEROBIC Blood Culture results may not be optimal due to an inadequate volume of blood received in culture bottles   Culture   Final    NO GROWTH < 12 HOURS Performed at [facility], [address] [place], [place] [address]    Report Status PENDING  Incomplete  Culture, blood (Routine X 2) w Reflex to ID Panel     Status: None (Preliminary result)   Collection Time: 09/30/23 10:39 AM   Specimen: BLOOD LEFT HAND  Result Value Ref Range Status   Specimen Description BLOOD LEFT HAND  Final   Special Requests   Final    BOTTLES DRAWN AEROBIC AND ANAEROBIC Blood Culture results may not be optimal due to an inadequate volume of blood received in culture bottles   Culture   Final    NO GROWTH < 12 HOURS Performed at [facility], [address] [place], [place] [address]    Report Status PENDING  Incomplete    Radiology Studies: DG CHEST PORT 1 VIEW Result Date: 09/30/2023 CLINICAL DATA:  Shortness of breath. EXAM: PORTABLE CHEST 1 VIEW COMPARISON:  September 29, 2023. FINDINGS: Stable cardiomediastinal silhouette. Increased left basilar opacity is noted concerning for pneumonia or atelectasis with possible small pleural effusion. Right lung is unremarkable. IMPRESSION: Increased left basilar opacity as noted above. Electronically Signed   By: Lupita Raider M.D.   On: 09/30/2023 14:18   CT CHEST WO CONTRAST Addendum Date: 09/29/2023 ADDENDUM REPORT: 09/29/2023 21:17 ADDENDUM: Addendum to add additional findings upon further review. Acute comminuted fracture of the left first rib. Additional nondisplaced fracture of the left second rib. These results were called by telephone at the time of interpretation on 09/29/2023 at 9:00 pm to  provider Dr. Toniann [name], Who verbally acknowledged these results. Electronically Signed   By: Minerva Fester M.D.   On: 09/29/2023 21:17   Result Date: 09/29/2023 CLINICAL DATA:  Shortness of breath low oxygen saturations. Open heart surgery in January 2025 EXAM: CT CHEST WITHOUT CONTRAST TECHNIQUE: Multidetector CT imaging of the chest was performed following the standard protocol without IV contrast. RADIATION DOSE REDUCTION: This exam was performed according to the departmental dose-optimization program which includes automated exposure control, adjustment of the mA and/or kV according to Chad [name] size and/or use of iterative reconstruction technique. COMPARISON:  Same day chest radiographs and CT 09/23/2022 FINDINGS: Cardiovascular: Small pericardial effusion. Sternotomy with unhealed sternal fracture compatible with recent open heart surgery. TAVR. Coronary artery calcification. Aortic atherosclerotic calcification. Mediastinum/Nodes: Small amount of free fluid in the anterior mediastinum compatible with recent heart surgery. Trachea and esophagus are unremarkable. Evaluation for adenopathy is limited without IV contrast. Lungs/Pleura: Large left pleural effusion. Near total atelectasis of the left lower lobe and partial atelectasis of the left upper lobe. Patchy ground-glass opacities throughout the right lung. Small right pleural effusion. No pneumothorax. Upper Abdomen: No acute abnormality. Musculoskeletal: No acute fracture. IMPRESSION: 1. Large left pleural effusion with near total atelectasis of the left lower lobe and partial atelectasis of the left upper lobe. 2. Patchy ground-glass opacities throughout the right lung, favor atypical infection. 3. Small right pleural effusion. 4. Postoperative change of open heart surgery in January 2025. Small amount of residual fluid in the anterior mediastinum. No evidence of abscess. 5. Small pericardial effusion likely related to recent open heart surgery.  Pericarditis is difficult to exclude. 6. Aortic  Atherosclerosis (ICD10-I70.0). Electronically Signed: By: Minerva Fester M.D. On: 09/29/2023 19:18   DG Chest 2 View Result Date: 09/29/2023 CLINICAL DATA:  Status post open heart surgery in January 2025 presenting with shortness of breath and nonproductive cough. EXAM: CHEST - 2 VIEW COMPARISON:  September 23, 2023 FINDINGS: Multiple sternal wires are present. The heart size and visualized mediastinal contours are within normal limits. An artificial aortic valve is suspected. A stable, moderate to large left-sided pleural effusion is seen. Underlying atelectasis and/or infiltrate is suspected. No pneumothorax is identified. Multilevel degenerative changes seen throughout the thoracic spine. IMPRESSION: Stable, moderate to large left-sided pleural effusion with suspected underlying atelectasis and/or infiltrate. Electronically Signed   By: Aram Candela M.D.   On: 09/29/2023 17:33   DG Chest 1 View Result Date: 09/29/2023 CLINICAL DATA:  78 year old male status post left-sided thoracentesis EXAM: CHEST  1 VIEW COMPARISON:  CT chest 09/29/2023, plain film 09/29/2023 FINDINGS: Cardiomediastinal silhouette unchanged. Surgical changes of median sternotomy, CABG, TAVR. Near complete resolution of left-sided pleural fluid. Some residual opacity at the pleuroparenchymal interface at the base of the left lung. No pneumothorax. No new airspace disease. Right lung relatively well aerated. IMPRESSION: No complicating features status post left-sided thoracentesis, with mild residual fluid/consolidation. Electronically Signed   By: Gilmer Mor D.O.   On: 09/29/2023 16:59   IR THORACENTESIS ASP PLEURAL SPACE W/IMG GUIDE Result Date: 09/29/2023 INDICATION: Recent cardiac surgery with large left pleural effusion. Request for diagnostic and therapeutic thoracentesis. EXAM: ULTRASOUND GUIDED LEFT THORACENTESIS MEDICATIONS: 1% lidocaine 10 mL COMPLICATIONS: None immediate.  PROCEDURE: An ultrasound guided thoracentesis was thoroughly discussed with the Chad [name] and questions answered. The benefits, risks, alternatives and complications were also discussed. The Chad [name] understands and wishes to proceed with the procedure. Written consent was obtained. Ultrasound was performed to localize and mark an adequate pocket of fluid in the left chest. The area was then prepped and draped in the normal sterile fashion. 1% Lidocaine was used for local anesthesia. Under ultrasound guidance a 6 Fr Safe-T-Centesis catheter was introduced. Thoracentesis was performed. The catheter was removed and a dressing applied. FINDINGS: A total of approximately 1.9 L of clear amber fluid was removed. Samples were sent to the laboratory as requested by the clinical team. IMPRESSION: Successful ultrasound guided left thoracentesis yielding 1.9 L of pleural fluid. Procedure performed by: Corrin Parker, PA-C Electronically Signed   By: Gilmer Mor D.O.   On: 09/29/2023 16:47   Scheduled Meds:  amLODipine  10 mg Oral Daily   aspirin  81 mg Oral Daily   azithromycin  500 mg Oral Daily   empagliflozin  10 mg Oral Daily   enoxaparin (LOVENOX) injection  30 mg Subcutaneous Q24H   gabapentin  600 mg Oral BID   guaiFENesin  1,200 mg Oral BID   insulin aspart  0-9 Units Subcutaneous TID WC   insulin glargine  14 Units Subcutaneous QHS   ipratropium  0.5 mg Nebulization Q6H   levalbuterol  0.63 mg Nebulization Q6H   [START ON 10/01/2023] levothyroxine  100 mcg Oral QAC breakfast   metoprolol tartrate  12.5 mg Oral BID   rosuvastatin  20 mg Oral Daily   Continuous Infusions:  cefTRIAXone (ROCEPHIN)  IV      LOS: 0 days   Marguerita Merles, DO [facility] Available via Epic secure chat 7am-7pm After these hours, please refer to coverage provider listed on [email or web address] 09/30/2023, 7:33 PM

## 2023-10-01 ENCOUNTER — Telehealth: Payer: Self-pay | Admitting: Cardiovascular Disease

## 2023-10-01 ENCOUNTER — Other Ambulatory Visit (HOSPITAL_COMMUNITY): Payer: Self-pay

## 2023-10-01 ENCOUNTER — Inpatient Hospital Stay (HOSPITAL_COMMUNITY)

## 2023-10-01 ENCOUNTER — Ambulatory Visit (HOSPITAL_COMMUNITY): Payer: PPO

## 2023-10-01 DIAGNOSIS — J9601 Acute respiratory failure with hypoxia: Secondary | ICD-10-CM | POA: Diagnosis not present

## 2023-10-01 DIAGNOSIS — R0609 Other forms of dyspnea: Secondary | ICD-10-CM

## 2023-10-01 DIAGNOSIS — D649 Anemia, unspecified: Secondary | ICD-10-CM | POA: Diagnosis not present

## 2023-10-01 DIAGNOSIS — Z951 Presence of aortocoronary bypass graft: Secondary | ICD-10-CM | POA: Diagnosis not present

## 2023-10-01 DIAGNOSIS — I10 Essential (primary) hypertension: Secondary | ICD-10-CM | POA: Diagnosis not present

## 2023-10-01 DIAGNOSIS — J90 Pleural effusion, not elsewhere classified: Secondary | ICD-10-CM | POA: Diagnosis not present

## 2023-10-01 LAB — CBC WITH DIFFERENTIAL/PLATELET
Abs Immature Granulocytes: 0.06 10*3/uL (ref 0.00–0.07)
Basophils Absolute: 0 10*3/uL (ref 0.0–0.1)
Basophils Relative: 0 %
Eosinophils Absolute: 0.3 10*3/uL (ref 0.0–0.5)
Eosinophils Relative: 4 %
HCT: 26.5 % — ABNORMAL LOW (ref 39.0–52.0)
Hemoglobin: 8.8 g/dL — ABNORMAL LOW (ref 13.0–17.0)
Immature Granulocytes: 1 %
Lymphocytes Relative: 17 %
Lymphs Abs: 1.1 10*3/uL (ref 0.7–4.0)
MCH: 29.1 pg (ref 26.0–34.0)
MCHC: 33.2 g/dL (ref 30.0–36.0)
MCV: 87.7 fL (ref 80.0–100.0)
Monocytes Absolute: 0.5 10*3/uL (ref 0.1–1.0)
Monocytes Relative: 8 %
Neutro Abs: 4.7 10*3/uL (ref 1.7–7.7)
Neutrophils Relative %: 70 %
Platelets: 90 10*3/uL — ABNORMAL LOW (ref 150–400)
RBC: 3.02 MIL/uL — ABNORMAL LOW (ref 4.22–5.81)
RDW: 15.5 % (ref 11.5–15.5)
WBC: 6.7 10*3/uL (ref 4.0–10.5)
nRBC: 0 % (ref 0.0–0.2)

## 2023-10-01 LAB — BRAIN NATRIURETIC PEPTIDE: B Natriuretic Peptide: 354.5 pg/mL — ABNORMAL HIGH (ref 0.0–100.0)

## 2023-10-01 LAB — COMPREHENSIVE METABOLIC PANEL
ALT: 44 U/L (ref 0–44)
AST: 47 U/L — ABNORMAL HIGH (ref 15–41)
Albumin: 2.1 g/dL — ABNORMAL LOW (ref 3.5–5.0)
Alkaline Phosphatase: 63 U/L (ref 38–126)
Anion gap: 8 (ref 5–15)
BUN: 52 mg/dL — ABNORMAL HIGH (ref 8–23)
CO2: 19 mmol/L — ABNORMAL LOW (ref 22–32)
Calcium: 8.4 mg/dL — ABNORMAL LOW (ref 8.9–10.3)
Chloride: 104 mmol/L (ref 98–111)
Creatinine, Ser: 3.61 mg/dL — ABNORMAL HIGH (ref 0.61–1.24)
GFR, Estimated: 17 mL/min — ABNORMAL LOW (ref >60)
Glucose, Bld: 136 mg/dL — ABNORMAL HIGH (ref 70–99)
Potassium: 3.7 mmol/L (ref 3.5–5.1)
Sodium: 131 mmol/L — ABNORMAL LOW (ref 135–145)
Total Bilirubin: 0.4 mg/dL (ref 0.0–1.2)
Total Protein: 4.8 g/dL — ABNORMAL LOW (ref 6.5–8.1)

## 2023-10-01 LAB — ECHOCARDIOGRAM COMPLETE
AR max vel: 2.17 cm2
AV Area VTI: 2.67 cm2
AV Area mean vel: 2.33 cm2
AV Mean grad: 10 mmHg
AV Peak grad: 17.5 mmHg
Ao pk vel: 2.09 m/s
Area-P 1/2: 2.55 cm2
Height: 68 in
S' Lateral: 2.6 cm
Weight: 2864 [oz_av]

## 2023-10-01 LAB — MISC LABCORP TEST (SEND OUT): Labcorp test code: 9985

## 2023-10-01 LAB — GLUCOSE, CAPILLARY
Glucose-Capillary: 144 mg/dL — ABNORMAL HIGH (ref 70–99)
Glucose-Capillary: 225 mg/dL — ABNORMAL HIGH (ref 70–99)
Glucose-Capillary: 247 mg/dL — ABNORMAL HIGH (ref 70–99)

## 2023-10-01 LAB — MAGNESIUM: Magnesium: 2 mg/dL (ref 1.7–2.4)

## 2023-10-01 LAB — PHOSPHORUS: Phosphorus: 4.4 mg/dL (ref 2.5–4.6)

## 2023-10-01 MED ORDER — SODIUM CHLORIDE 0.9 % IV SOLN
500.0000 mg | INTRAVENOUS | Status: DC
  Administered 2023-10-01: 500 mg via INTRAVENOUS
  Filled 2023-10-01: qty 25

## 2023-10-01 MED ORDER — FUROSEMIDE 20 MG PO TABS: 20.0000 mg | ORAL_TABLET | Freq: Every day | ORAL | Status: DC | PRN

## 2023-10-01 MED ORDER — CEFDINIR 300 MG PO CAPS
300.0000 mg | ORAL_CAPSULE | Freq: Two times a day (BID) | ORAL | 0 refills | Status: AC
  Filled 2023-10-01: qty 10, 5d supply, fill #0

## 2023-10-01 MED ORDER — AZITHROMYCIN 500 MG PO TABS
500.0000 mg | ORAL_TABLET | Freq: Every day | ORAL | 0 refills | Status: AC
  Filled 2023-10-01: qty 3, 3d supply, fill #0

## 2023-10-01 MED ORDER — GUAIFENESIN ER 600 MG PO TB12
600.0000 mg | ORAL_TABLET | Freq: Two times a day (BID) | ORAL | 0 refills | Status: AC
  Filled 2023-10-01: qty 10, 5d supply, fill #0

## 2023-10-01 MED ORDER — ACETAMINOPHEN 325 MG PO TABS
650.0000 mg | ORAL_TABLET | Freq: Four times a day (QID) | ORAL | 0 refills | Status: DC | PRN
  Filled 2023-10-01: qty 20, 3d supply, fill #0

## 2023-10-01 MED ORDER — LEVOTHYROXINE SODIUM 100 MCG PO TABS
100.0000 ug | ORAL_TABLET | Freq: Every day | ORAL | 0 refills | Status: AC
  Filled 2023-10-01: qty 30, 30d supply, fill #0

## 2023-10-01 NOTE — Progress Notes (Signed)
 SATURATION QUALIFICATIONS: (This note is used to comply with regulatory documentation for home oxygen)  Patient Saturations on Room Air at Rest = 77%  Patient Saturations on Room Air while Ambulating = Not assessed due to desaturation at rest  Patient Saturations on 2 Liters of oxygen while Ambulating = 91%  Please briefly explain why patient needs home oxygen: Pt requires supplemental oxygen to maintain oxygen saturation above 90% when resting and to improve activity tolerance when mobilizing.  Arlyss Gandy, PT, DPT Acute Rehabilitation Office [phone]

## 2023-10-01 NOTE — Evaluation (Signed)
 Physical Therapy Evaluation Patient Details Name: Chad [name] MRN: [id number] DOB: 1946-01-02 Today's Date: 10/01/2023  History of Present Illness  78 y.o. male presents to [facility] hospital on 09/29/2023 with worsening SOB. Pt found to have a large L pleural effusion and underwent thoracentesis on 3/5. Subsequent x-ray with findings of a post-obstructive PNA. PMH includes aortic valve replacement, CABG Jan 2025, DMII, CKD IV, anemia.  Clinical Impression  Pt is a 78 y.o. male presenting on 09/29/23 with above conditions and deficits below, see PT Problem List. PTA pt lived in an independent living facility with his wife and used a RW for mobility. Currently pt is supervision for bed mobility and transfers and CGA-supervision for gait with a RW. Pt displays impairments in functional mobility, activity tolerance, dynamic/static balance, gait, and requires supplemental oxygen use at rest and with activity and would benefit from continued acute PT before d/c. Pt continues to require 2 L of oxygen by [place] to maintain SpO2 levels >90% at rest and during ambulation. Given pt's previous CABG surgery on 08/10/23 he would benefit from cardiac rehab to promote return to his independent baseline. Will continue to follow acutely.          If plan is discharge home, recommend the following: A little help with walking and/or transfers;A little help with bathing/dressing/bathroom;Assistance with cooking/housework;Assist for transportation   Can travel by private vehicle        Equipment Recommendations None recommended by PT  Recommendations for Other Services       Functional Status Assessment Patient has had a recent decline in their functional status and demonstrates the ability to make significant improvements in function in a reasonable and predictable amount of time.     Precautions / Restrictions Precautions Precautions: Fall Recall of Precautions/Restrictions: Intact Restrictions Weight Bearing Restrictions  Per Provider Order: No      Mobility  Bed Mobility Overal bed mobility: Needs Assistance Bed Mobility: Supine to Sit     Supine to sit: Supervision, HOB elevated, Used rails     General bed mobility comments: Pt requires HOB elevation, additional time, and use of R rail to sit on the R side of the bed.    Transfers Overall transfer level: Needs assistance Equipment used: Rolling walker (2 wheels) Transfers: Sit to/from Stand Sit to Stand: Supervision           General transfer comment: Pt able to transfer from lowest bed height with minimal increased time and trunk flexion. Required cueing to uncross legs and hand placement before standing    Ambulation/Gait Ambulation/Gait assistance: Contact guard assist, Supervision Gait Distance (Feet): 150 Feet Assistive device: Rolling walker (2 wheels) Gait Pattern/deviations: Step-through pattern, Decreased stride length, Shuffle, Trunk flexed, Narrow base of support Gait velocity: reduced Gait velocity interpretation: 1.31 - 2.62 ft/sec, indicative of limited community ambulator   General Gait Details: CGA with head turns, quick turns, and walking backwards. Supervision for standard forward walking. Pt required cueing to maintain upright trunk posture and increasing stride length  Stairs            Wheelchair Mobility     Tilt Bed    Modified Rankin (Stroke Patients Only)       Balance Overall balance assessment: Needs assistance Sitting-balance support: Single extremity supported, No upper extremity supported, Feet supported Sitting balance-Leahy Scale: Good Sitting balance - Comments: pt sits EOB primarly without UE support. No LOB   Standing balance support: Bilateral upper extremity supported, During functional activity Standing balance-Leahy  Scale: Fair Standing balance comment: Pt stands with increased trunk flexion and requires consistent cueing to maintain upright trunk posture. no LOB              High level balance activites: Backward walking, Turns, Sudden stops, Head turns High Level Balance Comments: Pt continued to walk with reduced speed with all dynamic balance challenges but was able to maintain a continued straight path with deviation. Pt greatly reduced speed when walking backwards and required cueing for maintaining proximity to RW.             Pertinent Vitals/Pain Pain Assessment Pain Assessment: No/denies pain    Home Living Family/patient expects to be discharged to:: Other (Comment) (Independent living) Living Arrangements: Spouse/significant other Available Help at Discharge: Family;Personal care attendant;Available 24 hours/day;Available PRN/intermittently (wife available 24 hours/day. personal care attendant comes daily 11:00- 19:00) Type of Home: Independent living facility Home Access: Level entry       Home Layout: One level Home Equipment: Agricultural consultant (2 wheels);Cane - single point;Shower seat;Grab bars - tub/shower;Grab bars - toilet;Transport chair;Lift chair;Wheelchair - manual Additional Comments: The facility has w/c, transport chair, and lift chairs available if needed. Pt was receiving OT and PT services at the facility.    Prior Function Prior Level of Function : Independent/Modified Independent;Needs assist;Driving       Physical Assist : Mobility (physical);ADLs (physical) Mobility (physical): Gait;Stairs ADLs (physical): IADLs Mobility Comments: Prior to CABG surgery on 08/10/23 pt was independent in all mobility. S/p surgery pt uses RW for home and community ambulation ADLs Comments: Prior to CABG surgery on 08/10/23 pt was independent in all ADLs. S/p pt requires assistance from personal care attendant for IADLs     Extremity/Trunk Assessment   Upper Extremity Assessment Upper Extremity Assessment: Defer to OT evaluation    Lower Extremity Assessment Lower Extremity Assessment: Generalized weakness    Cervical / Trunk  Assessment Cervical / Trunk Assessment: Kyphotic  Communication   Communication Communication: Impaired Factors Affecting Communication: Hearing impaired    Cognition Arousal: Alert Behavior During Therapy: WFL for tasks assessed/performed   PT - Cognitive impairments: No apparent impairments                         Following commands: Intact       Cueing Cueing Techniques: Verbal cues     General Comments General comments (skin integrity, edema, etc.): Pt's SpO2 on RA at rest sat in the low 80s with the lowest being 77%. Pt was placed back on 2 L of O2 by [place] to keep SpO2 > 90% at rest and with ambulation    Exercises     Assessment/Plan    PT Assessment Patient needs continued PT services  PT Problem List Decreased strength;Decreased range of motion;Decreased activity tolerance;Decreased balance;Decreased mobility;Decreased coordination;Cardiopulmonary status limiting activity       PT Treatment Interventions DME instruction;Gait training;Stair training;Functional mobility training;Therapeutic activities;Therapeutic exercise;Balance training;Neuromuscular re-education;Patient/family education    PT Goals (Current goals can be found in the Care Plan section)  Acute Rehab PT Goals Patient Stated Goal: return to baseline PT Goal Formulation: With patient/family Time For Goal Achievement: 10/15/23 Potential to Achieve Goals: Good    Frequency Min 2X/week     Co-evaluation               AM-PAC PT "6 Clicks" Mobility  Outcome Measure Help needed turning from your back to your side while in a flat bed without using  bedrails?: A Little Help needed moving from lying on your back to sitting on the side of a flat bed without using bedrails?: A Little Help needed moving to and from a bed to a chair (including a wheelchair)?: A Little Help needed standing up from a chair using your arms (e.g., wheelchair or bedside chair)?: A Little Help needed to walk in  hospital room?: A Little Help needed climbing 3-5 steps with a railing? : A Lot 6 Click Score: 17    End of Session Equipment Utilized During Treatment: Gait belt;Oxygen Activity Tolerance: Patient tolerated treatment well Patient left: in chair;with call bell/phone within reach;with chair alarm set;with family/visitor present Nurse Communication: Mobility status;Other (comment) (O2 readings and oxygen re- adminstration) PT Visit Diagnosis: Other abnormalities of gait and mobility (R26.89);Muscle weakness (generalized) (M62.81);Difficulty in walking, not elsewhere classified (R26.2);Unsteadiness on feet (R26.81)    Time: 1027-2536 PT Time Calculation (min) (ACUTE ONLY): 34 min   Charges:   PT Evaluation $PT Eval Low Complexity: 1 Low   PT General Charges $$ ACUTE PT VISIT: 1 Visit        [address], SPT   [place] 10/01/2023, 11:24 AM

## 2023-10-01 NOTE — Progress Notes (Signed)
 Patient arrived in the floor at 1240 pm after procedure

## 2023-10-01 NOTE — Plan of Care (Signed)
  Problem: Education: Goal: Knowledge of General Education information will improve Description: Including pain rating scale, medication(s)/side effects and non-pharmacologic comfort measures Outcome: Progressing   Problem: Activity: Goal: Risk for activity intolerance will decrease Outcome: Progressing   Problem: Nutrition: Goal: Adequate nutrition will be maintained Outcome: Progressing   Problem: Coping: Goal: Level of anxiety will decrease Outcome: Progressing   Problem: Elimination: Goal: Will not experience complications related to bowel motility Outcome: Progressing   Problem: Pain Managment: Goal: General experience of comfort will improve and/or be controlled Outcome: Progressing

## 2023-10-01 NOTE — Telephone Encounter (Signed)
 Left voicemail to return call to office

## 2023-10-01 NOTE — Consult Note (Addendum)
 Cardiology Consultation   Chad [name] ID: Chad [name] MRN: [id number]; DOB: January 11, 1946  Admit date: 09/29/2023 Date of Consult: 10/01/2023  PCP:  Renford Dills, MD   [facility] Providers Cardiologist:  Charlton Haws, MD    Chad [name] Profile:   Chad [name] is a 78 y.o. male with a hx of severe AS and 3V CAD s/p CABG/AVR (Dr. Kizzie Bane at [facility] 07/2023), HTN, HLD, CVD IV (baseline cr 1.8), DM2,  who is being seen 10/01/2023 for the evaluation of acute respiratory with hypoxia at the request of Dr. Marland Mcalpine.  History of Present Illness:   Chad [name] is a 26 YOM with above medical history.  08/10/23: 25 mm Perceval rapid deployment bovine pericardial valve and CABG with LIMA to LAD, SVG OM Hospitalized for 11 days due to ARF/CRF PAF and urinary retention. Course was complicated by significant AKI with a peak Cr >5.0, urinary retention requiring catheter placement followed by urology, and new post-op atrial fibrillation with significant ventricular ectopy treated with amiodarone and mexiletine with plans for EP referral. Not anticoagulated.   Chad [name] was last seen in cardiology clinic on 09/10/2023 for post CABG/AVR follow-up.  At this time Chad [name] was stable, sternum healing and good urinary output.  Recommendations to continue ASA, Lopressor, insulin and Jardiance.  He was also in NSR and supposed to start weaning amiodarone and DC in mexiletine as suggested by Dr. Ladona Ridgel due to renal issues.   EKG: NSR, HR 66 with nonspecfic ST changes (unchanged from previous)  BNP: 354, TSH 21 ECHO: LVEF 60-65%. Normal LV and RV. Post AVR with hybrid 25 mm Liva Nova Perceval rapid deployment valve  normal gradients and no PVL.  Chad [name] daughter contacted cardiology for Chad [name]'s shortness of breath.  Chad [name] and daughter is aware of left pleural effusion upon discharge from [facility] recently.  However home O2 saturations have been 87 to 89%.  Chad [name] presented to the ED for worsening SOB and  dyspnea.  He was found to have a very large left-sided pleural effusion which underwent a thoracentesis.  Currently CXR shows a postobstructive pneumonia is being treated with antibiotics.  Chad [name] does not wear oxygen at home at baseline however has required oxygen supplementation this hospitalization. Successful ultrasound guided left thoracentesis yielding 1.9 L of pleural fluid.   Cardiology was consulted for shortness of breath.  On interview Chad [name] reports that since being discharged from [facility] he has had progressive dyspnea.  He was unable to complete task in the house without getting shortness of breath.  Also experienced shortness of breath at rest.  Denies any chest pain, dizziness, weakness.  After the thoracentesis, he reports markedly improved breathing.  He is now able to walk up and down the halls with assistance, prior to hospitalization he was not able to ambulate to this degree due to SOB.  At home he is not on oxygen.  However he has required supplemental oxygen during this hospitalization even with ambulation during evaluation.  Past Medical History:  Diagnosis Date   Cataract    CKD (chronic kidney disease)    Colon polyps    Diabetes (HCC)    Diabetic polyneuropathy associated with type 2 diabetes mellitus (HCC) 01/15/2017   Diabetic retinopathy (HCC)    GERD (gastroesophageal reflux disease)    Hypercholesterolemia    Hypertension    Hypertensive retinopathy    Hypothyroidism    Polyneuropathy    Proteinuria    Severe aortic stenosis     Past Surgical History:  Procedure Laterality Date   CARDIAC SURGERY     IR THORACENTESIS ASP PLEURAL SPACE W/IMG GUIDE  09/29/2023   RIGHT/LEFT HEART CATH AND CORONARY ANGIOGRAPHY N/A 05/21/2023   Procedure: RIGHT/LEFT HEART CATH AND CORONARY ANGIOGRAPHY;  Surgeon: Orbie Pyo, MD;  Location: [facility] INVASIVE CV LAB;  Service: Cardiovascular;  Laterality: N/A;     Home Medications:  Prior to Admission medications   Medication Sig  Start Date End Date Taking? Authorizing Provider  amiodarone (PACERONE) 400 MG tablet TAKE 1 TABLET BY MOUTH TWICE A DAY UNTIL 08/29/23, THEN REDUCE TO 1 TABLET BY MOUTH DAILY Chad [name] taking differently: Take 400 mg by mouth daily. TAKE 1 TABLET BY MOUTH TWICE A DAY UNTIL 08/29/23, THEN REDUCE TO 1 TABLET BY MOUTH DAILY 08/23/23  Yes Georgie Chard D, NP  amLODipine (NORVASC) 10 MG tablet Take 1 tablet (10 mg total) by mouth daily. 09/10/23  Yes Wendall Stade, MD  aspirin 81 MG chewable tablet Chew 81 mg by mouth daily.   Yes [provider]  cefdinir (OMNICEF) 300 MG capsule Take 1 capsule (300 mg total) by mouth 2 (two) times daily for 5 days. 10/01/23 10/06/23 Yes Sheikh, Omair Latif, DO  gabapentin (NEURONTIN) 300 MG capsule Take 600 mg by mouth 2 (two) times daily.   Yes [provider]  insulin glargine (LANTUS) 100 UNIT/ML injection Inject 14 Units into the skin at bedtime.   Yes [provider]  JARDIANCE 10 MG TABS tablet Take 10 mg by mouth daily. 11/11/20  Yes [provider]  levothyroxine (SYNTHROID, LEVOTHROID) 75 MCG tablet Take 75 mcg by mouth daily before breakfast.  12/28/16  Yes [provider]  metoprolol tartrate (LOPRESSOR) 25 MG tablet Take 12.5 mg by mouth 2 (two) times daily. 08/21/23 08/20/24 Yes [provider]  polyethylene glycol (MIRALAX / GLYCOLAX) 17 g packet Take 17 g by mouth as needed for moderate constipation. 08/21/23  Yes [provider]  rosuvastatin (CRESTOR) 20 MG tablet Take 20 mg by mouth daily.   Yes [provider]  acetaminophen (TYLENOL) 325 MG tablet Take 2 tablets (650 mg total) by mouth every 6 (six) hours as needed for mild pain (pain score 1-3) (or Fever >/= 101). 10/01/23   Marguerita Merles Latif, DO  azithromycin (ZITHROMAX) 500 MG tablet Take 1 tablet (500 mg total) by mouth daily for 3 days. 10/02/23 10/05/23  Marguerita Merles Latif, DO  B-D ULTRAFINE III SHORT PEN 31G X 8 MM MISC SMARTSIG:1 Each  SUB-Q Daily 04/25/21   [provider]  furosemide (LASIX) 20 MG tablet Take 1 tablet (20 mg total) by mouth daily. Chad [name] not taking: Reported on 09/10/2023 08/24/23 08/23/24  Filbert Schilder, NP  guaiFENesin (MUCINEX) 600 MG 12 hr tablet Take 1 tablet (600 mg total) by mouth 2 (two) times daily for 5 days. 10/01/23 10/06/23  Marguerita Merles Latif, DO  levothyroxine (SYNTHROID) 100 MCG tablet Take 1 tablet (100 mcg total) by mouth daily before breakfast. 10/02/23   Marguerita Merles Clinton, DO  [facility] VERIO test strip  04/24/21   [provider]  mexiletine (MEXITIL) 200 MG capsule Take 200 mg by mouth every 8 (eight) hours. Chad [name] not taking: Reported on 09/14/2023 08/21/23 08/20/24  [provider]    Inpatient Medications: Scheduled Meds:  amLODipine  10 mg Oral Daily   aspirin  81 mg Oral Daily   azithromycin  500 mg Oral Daily   empagliflozin  10 mg Oral Daily   enoxaparin (LOVENOX)  injection  30 mg Subcutaneous Q24H   gabapentin  600 mg Oral BID   guaiFENesin  1,200 mg Oral BID   insulin aspart  0-9 Units Subcutaneous TID WC   insulin glargine  14 Units Subcutaneous QHS   ipratropium  0.5 mg Nebulization Q6H   levalbuterol  0.63 mg Nebulization Q6H   levothyroxine  100 mcg Oral QAC breakfast   metoprolol tartrate  12.5 mg Oral BID   rosuvastatin  20 mg Oral Daily   Continuous Infusions:  cefTRIAXone (ROCEPHIN)  IV 1 g (09/30/23 2057)   iron sucrose 500 mg (10/01/23 1300)   PRN Meds: acetaminophen **OR** acetaminophen, polyethylene glycol  Allergies:    Allergies  Allergen Reactions   Pork-Derived Products Other (See Comments)    Social History:   Social History   Socioeconomic History   Marital status: Married    Spouse name: Not on file   Number of children: 2   Years of education: college   Highest education level: Not on file  Occupational History   Occupation: Health visitor  Tobacco Use   Smoking status: Former   Smokeless  tobacco: Never  Advertising account planner   Vaping status: Never Used  Substance and Sexual Activity   Alcohol use: Yes   Drug use: No   Sexual activity: Not Currently  Other Topics Concern   Not on file  Social History Narrative   Chad [name] lives with wife in a 2 story home.  Has 2 children.  Works as a Health visitor.  Education: college.       Right handed   Social Drivers of Health   Financial Resource Strain: Low Risk  (08/11/2023)   Received from [facility]   Overall Financial Resource Strain (CARDIA)    Difficulty of Paying Living Expenses: Not hard at all  Food Insecurity: No Food Insecurity (09/29/2023)   Hunger Vital Sign    Worried About Running Out of Food in the Last Year: Never true    Ran Out of Food in the Last Year: Never true  Transportation Needs: No Transportation Needs (09/29/2023)   PRAPARE - Administrator, Civil Service (Medical): No    Lack of Transportation (Non-Medical): No  Physical Activity: Not on file  Stress: Not on file  Social Connections: Socially Integrated (09/29/2023)   Social Connection and Isolation Panel [NHANES]    Frequency of Communication with Friends and Family: More than three times a week    Frequency of Social Gatherings with Friends and Family: More than three times a week    Attends Religious Services: More than 4 times per year    Active Member of [employer] or Organizations: No    Attends Engineer, structural: 1 to 4 times per year    Marital Status: Married  Catering manager Violence: Not At Risk (09/29/2023)   Humiliation, Afraid, Rape, and Kick questionnaire    Fear of Current or Ex-Partner: No    Emotionally Abused: No    Physically Abused: No    Sexually Abused: No    Family History:   Family History  Problem Relation Age of Onset   Stroke Father    Glaucoma Father      ROS:  Please see the history of present illness.   All other ROS reviewed and negative.     Physical Exam/Data:    Vitals:   10/01/23 0240 10/01/23 0600 10/01/23 0802 10/01/23 0855  BP:  121/63 (!) 124/59  Pulse:  62 60 62  Resp:  16 18   Temp:  98.1 F (36.7 C) 98 F (36.7 C)   TempSrc:  Oral    SpO2: 91% 97% 93%   Weight:      Height:        Intake/Output Summary (Last 24 hours) at 10/01/2023 1529 Last data filed at 10/01/2023 1610 Gross per 24 hour  Intake 100 ml  Output 350 ml  Net -250 ml      09/29/2023    2:29 PM 09/10/2023    3:43 PM 08/23/2023    3:27 PM  Last 3 Weights  Weight (lbs) 179 lb 179 lb 208 lb 3.2 oz  Weight (kg) 81.194 kg 81.194 kg 94.439 kg     Body mass index is 27.22 kg/m.  General:  Laying in bed with no acute distress HEENT: normal Neck: no JVD Vascular: No carotid bruits; Distal pulses 2+ bilaterally Cardiac:  normal S1, S2; RRR; no murmur  Lungs:  diminished breath sounds bilaterally   Abd: soft, nontender, no hepatomegaly  Ext: no edema, SCD on Musculoskeletal:  No deformities, BUE and BLE strength normal and equal Skin: warm and dry  Neuro:  CNs 2-12 intact, no focal abnormalities noted Psych:  Normal affect   EKG:  The EKG was personally reviewed and demonstrates:  NSR, HR 66 with nonspecfic ST changes (unchanged from previous)  Telemetry:  Telemetry was personally reviewed and demonstrates:  unavailable to monitor   Relevant CV Studies: Cath 05/21/2023 Diagnostic Dominance: Right Left Main  Dist LM lesion is 70% stenosed.    Left Anterior Descending  The vessel exhibits minimal luminal irregularities.  Ost LAD lesion is 80% stenosed.  Mid LAD lesion is 30% stenosed.    Left Circumflex  Ost Cx to Prox Cx lesion is 80% stenosed.  Mid Cx to Dist Cx lesion is 40% stenosed.    Right Coronary Artery  Mid RCA to Dist RCA lesion is 80% stenosed.    Intervention   No interventions have been documented.  Hemo Data  Flowsheet Row Most Recent Value  Fick Cardiac Output 6.36 L/min  Fick Cardiac Output Index 3.21 (L/min)/BSA  RA A Wave 11  mmHg  RA V Wave 9 mmHg  RA Mean 9 mmHg  RV Systolic Pressure 40 mmHg  RV Diastolic Pressure 1 mmHg  RV EDP 10 mmHg  PA Systolic Pressure 38 mmHg  PA Diastolic Pressure 10 mmHg  PA Mean 20 mmHg  PW A Wave 16 mmHg  PW V Wave 14 mmHg  PW Mean 11 mmHg  AO Systolic Pressure 141 mmHg  AO Diastolic Pressure 54 mmHg  AO Mean 84 mmHg  QP/QS 1  TPVR Index 6.22 HRUI  TSVR Index 26.13 HRUI  PVR SVR Ratio 0.12  TPVR/TSVR Ratio 0.24  Recommendation: Continue with evaluation for transcatheter attic valve replacement; at this time, continue medical therapy for coronary artery disease in the context of Chad [name]'s stage IV chronic kidney disease and lack of exertional angina.    ECHO 10/01/23  IMPRESSIONS   1. Left ventricular ejection fraction, by estimation, is 60 to 65%. The  left ventricle has normal function. The left ventricle has no regional  wall motion abnormalities. Left ventricular diastolic parameters were  normal.   2. Right ventricular systolic function is normal. The right ventricular  size is normal.   3. The mitral valve is abnormal. No evidence of mitral valve  regurgitation. No evidence of mitral stenosis.   4. Post AVR  with hybrid 25 mm Liva Nova Perceval rapid deployment valve  normal gradients and no PVL. The aortic valve has been repaired/replaced.  Aortic valve regurgitation is not visualized. No aortic stenosis is  present. There is a 25 mm Liva Nova  Perceval rapid deployment/sutureless bovine pericardial valve valve  present in the aortic position. Procedure Date: 08/10/23.   5. The inferior vena cava is normal in size with greater than 50%  respiratory variability, suggesting right atrial pressure of 3 mmHg.   Laboratory Data:  High Sensitivity Troponin:  No results for input(s): "TROPONINIHS" in the last 720 hours.   Chemistry Recent Labs  Lab 09/29/23 1830 09/30/23 0731 10/01/23 0734  NA 133* 135 131*  K 4.9 4.0 3.7  CL 101 107 104  CO2 21* 19* 19*   GLUCOSE 295* 184* 136*  BUN 60* 54* 52*  CREATININE 3.89* 3.66* 3.61*  CALCIUM 9.7 8.8* 8.4*  MG  --   --  2.0  GFRNONAA 15* 16* 17*  ANIONGAP 11 9 8     Recent Labs  Lab 09/30/23 0731 10/01/23 0734  PROT 5.0* 4.8*  ALBUMIN 2.1* 2.1*  AST 20 47*  ALT 31 44  ALKPHOS 66 63  BILITOT 0.7 0.4   Lipids No results for input(s): "CHOL", "TRIG", "HDL", "LABVLDL", "LDLCALC", "CHOLHDL" in the last 168 hours.  Hematology Recent Labs  Lab 09/29/23 1830 09/30/23 0731 09/30/23 1043 10/01/23 0734  WBC 7.8 7.2  --  6.7  RBC 3.47* 3.04* 3.02* 3.02*  HGB 10.1* 8.8*  --  8.8*  HCT 30.6* 26.5*  --  26.5*  MCV 88.2 87.2  --  87.7  MCH 29.1 28.9  --  29.1  MCHC 33.0 33.2  --  33.2  RDW 15.3 15.5  --  15.5  PLT 116* 102*  --  90*   Thyroid  Recent Labs  Lab 09/30/23 0731  TSH 21.123*    BNP Recent Labs  Lab 10/01/23 0734  BNP 354.5*    DDimer No results for input(s): "DDIMER" in the last 168 hours.   Radiology/Studies:  ECHOCARDIOGRAM COMPLETE Result Date: 10/01/2023    ECHOCARDIOGRAM REPORT   Chad [name] Name:   Chad [name] Date of Exam: 10/01/2023 Medical Rec #:  [id number]    Height:       68.0 in Accession #:    [id number]   Weight:       179.0 lb Date of Birth:  [birth year]     BSA:          1.950 m Chad [name] Age:    77 years     BP:           124/59 mmHg Chad [name] Gender: M            HR:           54 bpm. Exam Location:  Inpatient Procedure: 2D Echo, Cardiac Doppler and Color Doppler (Both Spectral and Color            Flow Doppler were utilized during procedure). Indications:    R06.9 DOE  History:        Chad [name] has prior history of Echocardiogram examinations, most                 recent 08/15/2023. Prior CABG; Risk Factors:Hypertension and                 Diabetes.                 Aortic Valve:  25 mm Liva Nova Perceval rapid                 deployment/sutureless bovine pericardial valve valve is present                 in the aortic position. Procedure Date: 08/10/23.  Sonographer:     Irving Burton Senior RDCS Referring Phys: Marguerita Merles, LATIF IMPRESSIONS  1. Left ventricular ejection fraction, by estimation, is 60 to 65%. The left ventricle has normal function. The left ventricle has no regional wall motion abnormalities. Left ventricular diastolic parameters were normal.  2. Right ventricular systolic function is normal. The right ventricular size is normal.  3. The mitral valve is abnormal. No evidence of mitral valve regurgitation. No evidence of mitral stenosis.  4. Post AVR with hybrid 25 mm Liva Nova Perceval rapid deployment valve normal gradients and no PVL. The aortic valve has been repaired/replaced. Aortic valve regurgitation is not visualized. No aortic stenosis is present. There is a 25 mm Liva Nova Perceval rapid deployment/sutureless bovine pericardial valve valve present in the aortic position. Procedure Date: 08/10/23.  5. The inferior vena cava is normal in size with greater than 50% respiratory variability, suggesting right atrial pressure of 3 mmHg. FINDINGS  Left Ventricle: Left ventricular ejection fraction, by estimation, is 60 to 65%. The left ventricle has normal function. The left ventricle has no regional wall motion abnormalities. Strain was performed and the global longitudinal strain is indeterminate. The left ventricular internal cavity size was normal in size. There is no left ventricular hypertrophy. Left ventricular diastolic parameters were normal. Right Ventricle: The right ventricular size is normal. No increase in right ventricular wall thickness. Right ventricular systolic function is normal. Left Atrium: Left atrial size was normal in size. Right Atrium: Right atrial size was normal in size. Pericardium: There is no evidence of pericardial effusion. Mitral Valve: The mitral valve is abnormal. There is mild thickening of the mitral valve leaflet(s). There is mild calcification of the mitral valve leaflet(s). Mild mitral annular calcification. No evidence of  mitral valve regurgitation. No evidence of mitral valve stenosis. Tricuspid Valve: The tricuspid valve is normal in structure. Tricuspid valve regurgitation is mild . No evidence of tricuspid stenosis. Aortic Valve: Post AVR with hybrid 25 mm Liva Nova Perceval rapid deployment valve normal gradients and no PVL. The aortic valve has been repaired/replaced. Aortic valve regurgitation is not visualized. No aortic stenosis is present. Aortic valve mean gradient measures 10.0 mmHg. Aortic valve peak gradient measures 17.5 mmHg. Aortic valve area, by VTI measures 2.67 cm. There is a 25 mm Liva Nova Perceval rapid deployment/sutureless bovine pericardial valve valve present in the aortic position. Procedure Date: 08/10/23. Pulmonic Valve: The pulmonic valve was normal in structure. Pulmonic valve regurgitation is not visualized. No evidence of pulmonic stenosis. Aorta: The aortic root is normal in size and structure. Venous: The inferior vena cava is normal in size with greater than 50% respiratory variability, suggesting right atrial pressure of 3 mmHg. IAS/Shunts: No atrial level shunt detected by color flow Doppler. Additional Comments: 3D was performed not requiring image post processing on an independent workstation and was indeterminate.  LEFT VENTRICLE PLAX 2D LVIDd:         3.90 cm   Diastology LVIDs:         2.60 cm   LV e' medial:    7.62 cm/s LV PW:         1.10 cm   LV E/e' medial:  11.5 LV IVS:        1.00 cm   LV e' lateral:   6.85 cm/s LVOT diam:     2.30 cm   LV E/e' lateral: 12.8 LV SV:         109 LV SV Index:   56 LVOT Area:     4.15 cm  RIGHT VENTRICLE RV S prime:     7.51 cm/s TAPSE (M-mode): 0.9 cm LEFT ATRIUM             Index        RIGHT ATRIUM           Index LA diam:        3.20 cm 1.64 cm/m   RA Area:     20.60 cm LA Vol (A2C):   56.4 ml 28.93 ml/m  RA Volume:   58.20 ml  29.85 ml/m LA Vol (A4C):   43.6 ml 22.36 ml/m LA Biplane Vol: 53.8 ml 27.60 ml/m  AORTIC VALVE AV Area (Vmax):     2.17 cm AV Area (Vmean):   2.33 cm AV Area (VTI):     2.67 cm AV Vmax:           209.00 cm/s AV Vmean:          151.000 cm/s AV VTI:            0.407 m AV Peak Grad:      17.5 mmHg AV Mean Grad:      10.0 mmHg LVOT Vmax:         109.00 cm/s LVOT Vmean:        84.700 cm/s LVOT VTI:          0.262 m LVOT/AV VTI ratio: 0.64  AORTA Ao Root diam: 2.90 cm Ao Asc diam:  3.00 cm MITRAL VALVE MV Area (PHT): 2.55 cm    SHUNTS MV Decel Time: 298 msec    Systemic VTI:  0.26 m MV E velocity: 87.50 cm/s  Systemic Diam: 2.30 cm MV A velocity: 95.90 cm/s MV E/A ratio:  0.91 Charlton Haws MD Electronically signed by Charlton Haws MD Signature Date/Time: 10/01/2023/12:36:48 PM    Final    DG CHEST PORT 1 VIEW Result Date: 10/01/2023 CLINICAL DATA:  [id number] SOB (shortness of breath) on exertion [id number] EXAM: PORTABLE CHEST 1 VIEW COMPARISON:  09/30/2023 chest radiograph. FINDINGS: Intact sternotomy wires. TAVR in place. Stable cardiomediastinal silhouette with mild cardiomegaly. No pneumothorax. Stable small left pleural effusion. No right pleural effusion. No overt pulmonary edema. Similar patchy left lung base opacity. IMPRESSION: 1. Stable mild cardiomegaly. No overt pulmonary edema. 2. Stable small left pleural effusion. 3. Similar patchy left lung base opacity. Electronically Signed   By: Delbert Phenix M.D.   On: 10/01/2023 11:21   DG CHEST PORT 1 VIEW Result Date: 09/30/2023 CLINICAL DATA:  Shortness of breath. EXAM: PORTABLE CHEST 1 VIEW COMPARISON:  September 29, 2023. FINDINGS: Stable cardiomediastinal silhouette. Increased left basilar opacity is noted concerning for pneumonia or atelectasis with possible small pleural effusion. Right lung is unremarkable. IMPRESSION: Increased left basilar opacity as noted above. Electronically Signed   By: Lupita Raider M.D.   On: 09/30/2023 14:18   CT CHEST WO CONTRAST Addendum Date: 09/29/2023 ADDENDUM REPORT: 09/29/2023 21:17 ADDENDUM: Addendum to add additional findings upon further  review. Acute comminuted fracture of the left first rib. Additional nondisplaced fracture of the left second rib. These results were called by telephone at the time of interpretation on 09/29/2023  at 9:00 pm to provider Dr. Toniann Fail, Who verbally acknowledged these results. Electronically Signed   By: Minerva Fester M.D.   On: 09/29/2023 21:17   Result Date: 09/29/2023 CLINICAL DATA:  Shortness of breath low oxygen saturations. Open heart surgery in January 2025 EXAM: CT CHEST WITHOUT CONTRAST TECHNIQUE: Multidetector CT imaging of the chest was performed following the standard protocol without IV contrast. RADIATION DOSE REDUCTION: This exam was performed according to the departmental dose-optimization program which includes automated exposure control, adjustment of the mA and/or kV according to Chad [name] size and/or use of iterative reconstruction technique. COMPARISON:  Same day chest radiographs and CT 09/23/2022 FINDINGS: Cardiovascular: Small pericardial effusion. Sternotomy with unhealed sternal fracture compatible with recent open heart surgery. TAVR. Coronary artery calcification. Aortic atherosclerotic calcification. Mediastinum/Nodes: Small amount of free fluid in the anterior mediastinum compatible with recent heart surgery. Trachea and esophagus are unremarkable. Evaluation for adenopathy is limited without IV contrast. Lungs/Pleura: Large left pleural effusion. Near total atelectasis of the left lower lobe and partial atelectasis of the left upper lobe. Patchy ground-glass opacities throughout the right lung. Small right pleural effusion. No pneumothorax. Upper Abdomen: No acute abnormality. Musculoskeletal: No acute fracture. IMPRESSION: 1. Large left pleural effusion with near total atelectasis of the left lower lobe and partial atelectasis of the left upper lobe. 2. Patchy ground-glass opacities throughout the right lung, favor atypical infection. 3. Small right pleural effusion. 4. Postoperative  change of open heart surgery in January 2025. Small amount of residual fluid in the anterior mediastinum. No evidence of abscess. 5. Small pericardial effusion likely related to recent open heart surgery. Pericarditis is difficult to exclude. 6. Aortic Atherosclerosis (ICD10-I70.0). Electronically Signed: By: Minerva Fester M.D. On: 09/29/2023 19:18   DG Chest 2 View Result Date: 09/29/2023 CLINICAL DATA:  Status post open heart surgery in January 2025 presenting with shortness of breath and nonproductive cough. EXAM: CHEST - 2 VIEW COMPARISON:  September 23, 2023 FINDINGS: Multiple sternal wires are present. The heart size and visualized mediastinal contours are within normal limits. An artificial aortic valve is suspected. A stable, moderate to large left-sided pleural effusion is seen. Underlying atelectasis and/or infiltrate is suspected. No pneumothorax is identified. Multilevel degenerative changes seen throughout the thoracic spine. IMPRESSION: Stable, moderate to large left-sided pleural effusion with suspected underlying atelectasis and/or infiltrate. Electronically Signed   By: Aram Candela M.D.   On: 09/29/2023 17:33   DG Chest 1 View Result Date: 09/29/2023 CLINICAL DATA:  78 year old male status post left-sided thoracentesis EXAM: CHEST  1 VIEW COMPARISON:  CT chest 09/29/2023, plain film 09/29/2023 FINDINGS: Cardiomediastinal silhouette unchanged. Surgical changes of median sternotomy, CABG, TAVR. Near complete resolution of left-sided pleural fluid. Some residual opacity at the pleuroparenchymal interface at the base of the left lung. No pneumothorax. No new airspace disease. Right lung relatively well aerated. IMPRESSION: No complicating features status post left-sided thoracentesis, with mild residual fluid/consolidation. Electronically Signed   By: Gilmer Mor D.O.   On: 09/29/2023 16:59   IR THORACENTESIS ASP PLEURAL SPACE W/IMG GUIDE Result Date: 09/29/2023 INDICATION: Recent cardiac  surgery with large left pleural effusion. Request for diagnostic and therapeutic thoracentesis. EXAM: ULTRASOUND GUIDED LEFT THORACENTESIS MEDICATIONS: 1% lidocaine 10 mL COMPLICATIONS: None immediate. PROCEDURE: An ultrasound guided thoracentesis was thoroughly discussed with the Chad [name] and questions answered. The benefits, risks, alternatives and complications were also discussed. The Chad [name] understands and wishes to proceed with the procedure. Written consent was obtained. Ultrasound was performed to localize and mark an  adequate pocket of fluid in the left chest. The area was then prepped and draped in the normal sterile fashion. 1% Lidocaine was used for local anesthesia. Under ultrasound guidance a 6 Fr Safe-T-Centesis catheter was introduced. Thoracentesis was performed. The catheter was removed and a dressing applied. FINDINGS: A total of approximately 1.9 L of clear amber fluid was removed. Samples were sent to the laboratory as requested by the clinical team. IMPRESSION: Successful ultrasound guided left thoracentesis yielding 1.9 L of pleural fluid. Procedure performed by: Corrin Parker, PA-C Electronically Signed   By: Gilmer Mor D.O.   On: 09/29/2023 16:47     Assessment and Plan:   Acute respiratory  with hypoxia  Pleural effusion Dyspnea  Elevated BNP  - Upon discharge from [facility] Chad [name] has moderate size left pleural effusion. - Presented to the ED for worsening SOB and dyspnea.  He was found to have a very large left-sided pleural effusion which underwent a thoracentesis.  Based on lights criteria effusion is exudative. - 09/29/23: Successful ultrasound guided left thoracentesis yielding 1.9 L of pleural fluid. - Most recent Cxr 10/01/23: Stable mild cardiomegaly. No overt pulmonary edema. Stable small left pleural effusion. Similar patchy left lung base opacity. - Chad [name] evaluation on 10/01/22: Chad [name]'s SpO2 on RA at rest sat in the low 80s with the lowest being 77%. Chad [name] was placed back on 2 L of  O2 by [place] to keep SpO2 > 90% at rest and with ambulation - After the thoracentesis, he reports markedly improved breathing.  He is now able to walk up and down the halls with assistance, prior to hospitalization he was not able to ambulate to this degree due to SOB. - Appear euvolemic on exam. Diminished breath sound on the lower lobes.  - ECHO: LVEF 60-65%. Normal LV and RV. Post AVR with hybrid 25 mm Liva Nova Perceval rapid deployment valve  normal gradients and no PVL. -Appears euvolemic, would not discharge on scheduled Lasix.  Recommend monitoring daily weights and call if gains more than 3 pounds in 1 day or 5 pounds 1 week  Community-acquired pneumonia -CT: Right side lung infiltrates -Currently treated with IV antibiotics -As per primary team  CAD s/p CABG/AVR:  - At [facility] in January 2025 - 25 mm Percael rapid deployment valve and CABG with LIMA to LAD and SVG OM.  -C/W metoprolol tartrate 12.5 mg p.o. twice daily, rosuvastatin 20 mg daily, ASA 81 mg daily, Jardiance - ECHO: AVR stable   Post op atrial fibrillation/ventricular ectopy:  -  Dr Ladona Ridgel suggested weaning amiodarone and d/c mexiletine due to renal issues  -  He has d/c these medications successfully and denies any palpitations - Continue to self monitor pulse and use pulse oximeter to check for recurrence  CKD stage IV complicated by post-op AKI:  - [facility] hospitalization: Peak Cr >5 with a discharge Cr 1/25 at 4.7.  - 09/10/23: 2.8 with baseline prior to CABG/AVR around 2.0   - this admission: 3.89 > 3.66 > 3.61 - Managed by Dr [facility]  - Avoid nephrotoxic medications, contrast, dyes, hypotension and dehydration - has not received any lasix this admission  - continue to monitor. This should be Chad [name] new  Cr baseline of 3.6 -3.9  HTN  - BP currently 124/53 - currently  on amlodipine 10mg , metoprolol tartrate 12.5 mg p.o. twice daily,  DM2:  - Continue insulin, jardiance.  - Follows with  Dr. Nehemiah Settle  Chronic anemia likely from renal disease: Thrombocytopenia:  -  Hemoglobin/hematocrit 9 from 10.1/30.6 is now 8.8/26.5.iron level of 35, UIBC 196, TIBC 231, saturation ratios of 15%, ferritin level 201, folate of 14.6 and vitamin B12 728.  Platelet Count went from 116 -> 102  - currently transfuse IV iron  - continue to monitor for signs and symptoms bleeding; no overt bleeding noted  Hypoalbuminemia:  - Chad [name]'s Albumin is now 2.1   Otherwise managed by primary  - Hypothyroidism    [facility] will sign off.   Medication Recommendations: Continue current meds.  Appears euvolemic, would not discarded on scheduled Lasix.  Recommend monitoring daily weights and call if gains more than 3 pounds in 1 day or 5 pounds 1 week Other recommendations (labs, testing, etc): None Follow up as an outpatient: Has follow-up scheduled on 3/26    Risk Assessment/Risk Scores:      For questions or updates, please contact [facility] Please consult [email or web address] for contact info under    Signed, Basilio Cairo, PA-C  10/01/2023 3:29 PM   Chad [name] seen and examined.  Agree with above documentation.  Mr. Eastman is a 78 year old male with a history of multivessel CAD and severe aortic stenosis status post CABG/AVR at [facility] 07/2023, CKD stage IV, hypertension, T2DM who we are consulted for evaluation of acute hypoxic respiratory failure at the request of Dr. Marland Mcalpine.  Underwent CABG/AVR in January at [facility].  Course was complicated by AKI, creatinine peaked at greater than 5.  Also had new postop atrial fibrillation that was treated with amiodarone and mexiletine.  At follow-up with Dr. Eden Emms 08/2023, discussed with Dr. Ladona Ridgel in EP and weaned off the amiodarone and mexiletine.  He presented to ED for worsening shortness of breath on 3/5.  He was found to have a large left-sided pleural effusion, underwent thoracentesis with 1.9 L removed.  Reports feeling significantly improved  after thoracentesis.  Felt to have postobstructive pneumonia for which he was started on antibiotics.  Most recent vital signs showed BP 124/53, pulse 62, SpO2 88% on 2 L.  Labs notable for creatinine 3.61, sodium 131, albumin 2.1, BNP 354, WBC 6.7, hemoglobin 8.8, platelets 90.  On exam, Chad [name] is alert and oriented, regular rate and rhythm, no murmurs, diminished breath sounds, no LE edema or JVD.  For his acute hypoxic respiratory failure, suspect this was due to his large pleural effusion and pneumonia.  On exam, he appears euvolemic.  Would not discharge on scheduled Lasix.  Recommend monitoring daily weights and call if gains more than 3 pounds in 1 day or 5 pounds in 1 week.  Little Ishikawa, MD

## 2023-10-01 NOTE — Telephone Encounter (Signed)
 Spoke with son already.

## 2023-10-01 NOTE — Progress Notes (Signed)
 Patient weaned to RA at 0900 AM. Will continue to  monitor

## 2023-10-01 NOTE — Telephone Encounter (Signed)
 Patient's son was returning call. Please advise

## 2023-10-01 NOTE — Telephone Encounter (Signed)
 Called son back about message, and Dr. Fabio Bering recommendations. He stated his father was going to be our of the hospital by today. Son and daughter-in-law are both doctors, GI and Peds, they want to have a close follow-up after being in hospital. They both live in [place], and will be leaving by Tuesday, so they would like to be at follow-up appointment, to ensure patient is followed up and doing what he is suppose to do. Will see if Dr. Eden Emms is okay with double book on Monday. No other spots available on Monday, with DOD or APP.

## 2023-10-01 NOTE — Telephone Encounter (Signed)
 Already called patient and rescheduled for 2 weeks.

## 2023-10-01 NOTE — Telephone Encounter (Signed)
 Patient's son is calling to speak to the nurse. Please advise

## 2023-10-01 NOTE — Progress Notes (Signed)
 Transition of Care [facility]) - Inpatient Brief Assessment   Patient Details  Name: Chad [name] MRN: [id number] Date of Birth: [birth year]  [facility]) CM/SW Contact:    Janae Bridgeman, RN Phone Number: 10/01/2023, 11:48 AM   Clinical Narrative: CM met with the patient at the bedside to discuss TOC needs.  The patient lives at [facility] ILF apartment with wife and plans to return home today.  Patient has DME at the home including RW, shower chair, commode bars.  Patient has personal care aide through [employer] 5 days per week.  I provided choice regarding home oxygen DME and the patient did not have a preference.  I called [employer] [facility] and they will deliver portable oxygen to the bedside today.  Wife plans to call family to provide transportation to home later today once medically stable.   Transition of Care Asessment: Insurance and Status: (P) Insurance coverage has been reviewed Patient has primary care physician: (P) Yes Home environment has been reviewed: (P) from home with spoue Prior level of function:: (P) RW Prior/Current Home Services: (P) Current home services (Patient has personal care services through [employer] 5 days per week) Social Drivers of Health Review: (P) SDOH reviewed needs interventions Readmission risk has been reviewed: (P) Yes Transition of care needs: (P) transition of care needs identified, TOC will continue to follow

## 2023-10-01 NOTE — Telephone Encounter (Signed)
 Pt son calling back to discuss this message, please advise. I told him you would c/b as soon as you can.

## 2023-10-01 NOTE — Progress Notes (Signed)
 Echocardiogram 2D Echocardiogram has been performed.  Warren Lacy Olena Willy RDCS 10/01/2023, 12:18 PM

## 2023-10-01 NOTE — Progress Notes (Signed)
    Durable Medical Equipment  (From admission, onward)           Start     Ordered   10/01/23 1121  For home use only DME oxygen  Once       Question Answer Comment  Length of Need 6 Months   Mode or (Route) Nasal cannula   Liters per Minute 2   Frequency Continuous (stationary and portable oxygen unit needed)   Oxygen conserving device Yes   Oxygen delivery system Gas      10/01/23 1121

## 2023-10-01 NOTE — Evaluation (Signed)
 Occupational Therapy Evaluation Patient Details Name: Chad [name] MRN: [id number] DOB: 1945-11-21 Today's Date: 10/01/2023   History of Present Illness   78 y.o. male presents to [facility] hospital on 09/29/2023 with worsening SOB. Pt found to have a large L pleural effusion and underwent thoracentesis on 3/5. Subsequent x-ray with findings of a post-obstructive PNA. PMH includes aortic valve replacement, CABG Jan 2025, DMII, CKD IV, anemia.     Clinical Impressions Pt reports walking with a RW and having returned to modified independence in ADLs since his CABG in January. He is currently functioning at up to a supervision level in basic ADLs and mobility. Educated pt and wife in energy conservation strategies, pursed lip breathing an O2 safety as he will be using a concentrator when he returns home. Pt is hopeful for discharge later today. Will follow if he remains in the hospital. Pt was receiving HHOT at his ILF, recommend resuming.      If plan is discharge home, recommend the following:   A little help with walking and/or transfers;A lot of help with bathing/dressing/bathroom;Assistance with cooking/housework;Assist for transportation;Help with stairs or ramp for entrance     Functional Status Assessment   Patient has had a recent decline in their functional status and demonstrates the ability to make significant improvements in function in a reasonable and predictable amount of time.     Equipment Recommendations   None recommended by OT     Recommendations for Other Services         Precautions/Restrictions   Precautions Precautions: Fall Recall of Precautions/Restrictions: Intact Restrictions Weight Bearing Restrictions Per Provider Order: No     Mobility Bed Mobility   Bed Mobility: Supine to Sit, Sit to Supine     Supine to sit: Supervision, HOB elevated, Used rails Sit to supine: Supervision   General bed mobility comments: HOB up, use of rail     Transfers Overall transfer level: Needs assistance Equipment used: Rolling walker (2 wheels) Transfers: Sit to/from Stand Sit to Stand: Supervision                  Balance     Sitting balance-Leahy Scale: Good       Standing balance-Leahy Scale: Fair                             ADL either performed or assessed with clinical judgement   ADL Overall ADL's : Needs assistance/impaired Eating/Feeding: Independent;Sitting;Bed level   Grooming: Wash/dry hands;Standing;Supervision/safety   Upper Body Bathing: Set up;Sitting   Lower Body Bathing: Supervison/ safety;Sit to/from stand   Upper Body Dressing : Set up;Sitting   Lower Body Dressing: Supervision/safety;Sit to/from stand   Toilet Transfer: Supervision/safety;Ambulation   Toileting- Clothing Manipulation and Hygiene: Supervision/safety;Sit to/from stand       Functional mobility during ADLs: Supervision/safety;Rolling walker (2 wheels) General ADL Comments: Educated to wear O2 in shower, pursed lip breathing techniques, and encouraged continued use of IS.     Vision Baseline Vision/History: 1 Wears glasses Ability to See in Adequate Light: 0 Adequate Patient Visual Report: No change from baseline       Perception         Praxis         Pertinent Vitals/Pain Pain Assessment Pain Assessment: No/denies pain     Extremity/Trunk Assessment Upper Extremity Assessment Upper Extremity Assessment: Overall WFL for tasks assessed   Lower Extremity Assessment Lower Extremity Assessment: Defer to PT  evaluation   Cervical / Trunk Assessment Cervical / Trunk Assessment: Kyphotic   Communication Communication Factors Affecting Communication: Hearing impaired   Cognition Arousal: Alert Behavior During Therapy: WFL for tasks assessed/performed Cognition: No apparent impairments                               Following commands: Intact       Cueing  General Comments           Exercises     Shoulder Instructions      Home Living Family/patient expects to be discharged to:: Private residence Living Arrangements: Spouse/significant other Available Help at Discharge: Family;Personal care attendant;Available 24 hours/day;Available PRN/intermittently Type of Home: Independent living facility Home Access: Level entry     Home Layout: One level     Bathroom Shower/Tub: Producer, television/film/video: Handicapped height     Home Equipment: Agricultural consultant (2 wheels);Cane - single point;Shower seat;Grab bars - tub/shower;Grab bars - toilet;Transport chair;Lift chair;Wheelchair - manual   Additional Comments: The facility has w/c, transport chair, and lift chairs available if needed. Pt was receiving OT and PT services at the facility.      Prior Functioning/Environment Prior Level of Function : Independent/Modified Independent;Needs assist             Mobility Comments: pt ambulating with RW since CABG ADLs Comments: pt sits to shower, reports having returned to modified independence since his CABG, assisted for IADLs    OT Problem List: Decreased activity tolerance;Impaired balance (sitting and/or standing);Cardiopulmonary status limiting activity   OT Treatment/Interventions: Self-care/ADL training;Energy conservation;DME and/or AE instruction;Patient/family education;Therapeutic activities;Balance training      OT Goals(Current goals can be found in the care plan section)   Acute Rehab OT Goals OT Goal Formulation: With patient Time For Goal Achievement: 10/15/23 Potential to Achieve Goals: Good ADL Goals Pt Will Perform Grooming: with modified independence;standing Pt Will Perform Lower Body Bathing: with modified independence;sit to/from stand Pt Will Perform Lower Body Dressing: with modified independence;sit to/from stand Pt Will Perform Toileting - Clothing Manipulation and hygiene: with modified independence;sit to/from  stand Additional ADL Goal #1: Pt will employ energy conservation and pursed lip breathing strategies in ADLs and mobility.   OT Frequency:  Min 2X/week    Co-evaluation              AM-PAC OT "6 Clicks" Daily Activity     Outcome Measure Help from another person eating meals?: None Help from another person taking care of personal grooming?: A Little Help from another person toileting, which includes using toliet, bedpan, or urinal?: A Little Help from another person bathing (including washing, rinsing, drying)?: A Little Help from another person to put on and taking off regular upper body clothing?: A Little Help from another person to put on and taking off regular lower body clothing?: A Little 6 Click Score: 19   End of Session    Activity Tolerance: Patient tolerated treatment well Patient left: in bed;with call bell/phone within reach;with family/visitor present  OT Visit Diagnosis: Unsteadiness on feet (R26.81);Muscle weakness (generalized) (M62.81);Other (comment) (decreased activity tolerance)                Time: 1430-1501 OT Time Calculation (min): 31 min Charges:  OT General Charges $OT Visit: 1 Visit OT Evaluation $OT Eval Low Complexity: 1 Low OT Treatments $Self Care/Home Management : 8-22 mins  Berna Spare, OTR/L Acute Rehabilitation Services  Office: [phone]   Evern Bio 10/01/2023, 3:46 PM

## 2023-10-01 NOTE — Telephone Encounter (Signed)
 Daughter calling back said Dr Eden Emms wants pt seen on a different date, then the 10/04/23

## 2023-10-02 ENCOUNTER — Other Ambulatory Visit (HOSPITAL_COMMUNITY): Payer: Self-pay

## 2023-10-03 LAB — BODY FLUID CULTURE W GRAM STAIN: Culture: NO GROWTH

## 2023-10-03 NOTE — Discharge Summary (Signed)
 Physician Discharge Summary   Patient: Chad [name] MRN: [id number] DOB: 28-May-1946  Admit date:     09/29/2023  Discharge date: 10/01/2023  Discharge Physician: Marguerita Merles, DO   PCP: Renford Dills, MD   Recommendations at discharge:   Follow-up with PCP within 1 to 2 weeks repeat CBC, CMP, mag, Phos within 1 week Follow-up with cardiology Dr. Eden Emms within the coming weeks and also follow-up with cardiothoracic surgery in outpatient setting. Follow-up with hematology in outpatient setting for evaluation for thrombocytopenia and anemia Repeat thyroid function studies in 4 to 6 weeks as TSH was extremely high and we adjusted his levothyroxine Repeat chest x-ray in 3 to 6 weeks  Discharge Diagnoses: Principal Problem:   Acute respiratory failure with hypoxia (HCC) Active Problems:   CKD (chronic kidney disease) stage 3, GFR 30-59 ml/min (HCC)   Chronic anemia   Type 2 diabetes mellitus with vascular disease (HCC)   Hx of CABG   S/P AVR   Essential hypertension   Pneumonia   Hypothyroidism   Pleural effusion  Resolved Problems:   * No resolved hospital problems. *  Hospital Course: Patient is a 78 year old Caucasian male with past medical history significant for Mannam to CAD status post aortic valve replacement and recent CABG at [facility] in January 2025, diabetes mellitus type 2, chronic kidney disease stage IV, anemia of kidney disease as well as other comorbidities who presented worsening shortness of breath and dyspnea and was found to have a very large left-sided pleural effusion which she underwent a thoracentesis for.  Subsequently chest x-ray now shows a postobstructive pneumonia and has been placed on antibiotics.  Patient does not wear oxygen at home at baseline but desaturated and needs oxygen for discharge.  Repeat chest x-ray appears stable.  He is medically stable to be discharged at this time and follow-up with PCP, cardiology and cardiothoracic  surgery and if necessary referral to pulmonary in outpatient setting.  Referrals been made to hematologist given his anemia and thrombocytopenia.  Assessment and Plan:  Acute respiratory failure with hypoxia presently on 2 L oxygen likely related to patient's pleural effusion post CABG.  Thoracentesis was done.  Follow-up pleural fluid labs.  CT scan also showed rib fractures involving the left first and second. Dr. Toniann Fail discussed with on-call general surgery team no further intervention was needed as were likely related to his recent CABG. see treatment as below and continue to try to wean his oxygen. Based on Lights Criteria Effusion is Exudative.  Patient desaturate on ambulatory home O2 screen and will need to be weaned in the outpatient setting and will be sent home with supplemental oxygen.  Advising repeat chest x-ray in 3 to 6 weeks and if not improving after weaning will need pulmonary referral  Community Acquired Pneumonia with CT scan showing right-sided lung infiltrates and patient having persistent cough. C/w Azithromycin and Ceftriaxone while hospitalized and then changed to oral antibiotics and discharged with cefdinir and azithromycin. CXR done yesterday and showed a worsening and increasing basilar opacity concerning for pneumonia or atelectasis with possible small pleural effusion on the left and the right lung was unremarkable.   -We have started Xopenex and Atrovent scheduled and will also add Brovana budesonide in addition to guaifenesin 1200 mL p.o. twice daily, flutter valve and incentive spirometry.   -CXR done and showed stable cardiomegaly as well as no overt pulmonary edema and a small stable left pleural sided effusion and minimal patchy left lung base  opacity.  Repeat chest x-ray in 3 to 6 weeks and follow-up with primary.  Diabetes Mellitus Type 2: Takes Lantus 40 units at bedtime but now on 14 units. On sliding scale coverage with Sensitive Novolog SSI AC and C/w  Empagliflozin 10 mg po Daily  Hypothyroidism: TSH was 21.123 and will increase his levothyroxine to 100 mcg from 75 mcg.  He will need repeat TFTs in 4 to 6 weeks   Chronic kidney disease stage IV / Metabolic Acidosis: Creatinine improved from recent past.Current BUN/Cr Trend: Recent Labs  Lab 09/29/23 1830 09/30/23 0731 10/01/23 0734  BUN 60* 54* 52*  CREATININE 3.89* 3.66* 3.61*  -Has a Slight metabolic acidosis with a CO2 of 19, anion gap of 8, chloride level 104 -Avoid Nephrotoxic Medications, Contrast Dyes, Hypotension and Dehydration to Ensure Adequate Renal Perfusion and will need to Renally Adjust Meds -Continue to Monitor and Trend Renal Function carefully and repeat CMP within 1 week  Chronic anemia likely from renal disease: Hemoglobin/hematocrit 9 from 10.1/30.6 is now 8.8/26.5.  Check anemia panel which showed an iron level of 35, UIBC 196, TIBC 231, saturation ratios of 15%, ferritin level 201, folate of 14.6 and vitamin B12 728.  Transfused IV iron this a.m.  Continue to monitor for signs and symptoms of bleeding; no overt bleeding noted and will need follow-up with hematology  Thrombocytopenia: Platelet Count went from 116 -> 102 and is now 90. CTM for S/Sx of Bleeding; No overt bleeding noted. Repeat CBC within 1 week and will arrange outpatient follow-up with hematology for further evaluation  Recent CAD status post CABG and AVR at [facility] in January 2025.  C/w Metoprolol Tartrate 12.5 mg po BID, Rosuvastatin 20 mg po Daily, and ASA 81 mg po Daily and Monitor for any CP and C/w Empagliflozain at discharge -Repeat echo done and showed an EF of 60 to 65% and showed "Post AVR with hybrid 25 mm Liva Nova Perceval rapid deployment valve  normal gradients and no PVL"  Hypertension: C/w Amlodipine 10 mg po Daily and Metoprolol Tartrate 12.5 mg po BID  HLD: C/w Rousvastatin 20 mg po Daily.  Hypoalbuminemia: Patient's Albumin is now 2.1 given. CTM and Trend and repeat CMP  w/in 1 week  Consultants: Cardiology Procedures performed: ECHO  Disposition: Home health Diet recommendation:  Discharge Diet Orders (From admission, onward)     Start     Ordered   10/01/23 0000  Diet - low sodium heart healthy        10/01/23 1816   10/01/23 0000  Diet Carb Modified        10/01/23 1816           Cardiac and Carb modified diet DISCHARGE MEDICATION: Allergies as of 10/01/2023       Reactions   Pork-derived Products Other (See Comments)        Medication List     STOP taking these medications    amiodarone 400 MG tablet Commonly known as: PACERONE       TAKE these medications    acetaminophen 325 MG tablet Commonly known as: TYLENOL Take 2 tablets (650 mg total) by mouth every 6 (six) hours as needed for mild pain (pain score 1-3) (or Fever >/= 101).   amLODipine 10 MG tablet Commonly known as: NORVASC Take 1 tablet (10 mg total) by mouth daily.   aspirin 81 MG chewable tablet Chew 81 mg by mouth daily.   azithromycin 500 MG tablet Commonly known as: ZITHROMAX Take  1 tablet (500 mg total) by mouth daily for 3 days.   B-D ULTRAFINE III SHORT PEN 31G X 8 MM Misc Generic drug: Insulin Pen Needle SMARTSIG:1 Each SUB-Q Daily   cefdinir 300 MG capsule Commonly known as: OMNICEF Take 1 capsule (300 mg total) by mouth 2 (two) times daily for 5 days.   furosemide 20 MG tablet Commonly known as: Lasix Take 1 tablet (20 mg total) by mouth daily as needed. What changed:  when to take this reasons to take this   gabapentin 300 MG capsule Commonly known as: NEURONTIN Take 600 mg by mouth 2 (two) times daily.   guaiFENesin 600 MG 12 hr tablet Commonly known as: MUCINEX Take 1 tablet (600 mg total) by mouth 2 (two) times daily for 5 days.   insulin glargine 100 UNIT/ML injection Commonly known as: LANTUS Inject 14 Units into the skin at bedtime.   Jardiance 10 MG Tabs tablet Generic drug: empagliflozin Take 10 mg by mouth daily.    levothyroxine 100 MCG tablet Commonly known as: SYNTHROID Take 1 tablet (100 mcg total) by mouth daily before breakfast. What changed:  medication strength how much to take   metoprolol tartrate 25 MG tablet Commonly known as: LOPRESSOR Take 12.5 mg by mouth 2 (two) times daily.   OneTouch Verio test strip Generic drug: glucose blood   polyethylene glycol 17 g packet Commonly known as: MIRALAX / GLYCOLAX Take 17 g by mouth as needed for moderate constipation.   rosuvastatin 20 MG tablet Commonly known as: CRESTOR Take 20 mg by mouth daily.        Follow-up Information     [facility] Follow up.   Why: Cassie Freer will provide home oxygen. Contact information: [address] [place] [address] [phone]                Discharge Exam: Ceasar Mons Weights   09/29/23 1429  Weight: 81.2 kg   Vitals:   10/01/23 0855 10/01/23 1622  BP:  (!) 124/53  Pulse: 62 62  Resp:    Temp:  (!) 97.3 F (36.3 C)  SpO2:  (!) 88%   Examination: Physical Exam:  Constitutional: WN/WD overweight elderly Caucasian male no acute distress Respiratory: Diminished to auscultation bilaterally with some coarse breath sounds and does have some slight rhonchi and crackles but no appreciable wheezing or rales. Normal respiratory effort and patient is not tachypenic. No accessory muscle use.  Unlabored breathing but is wearing supplemental oxygen via nasal cannula Cardiovascular: RRR, no murmurs / rubs / gallops. S1 and S2 auscultated. No extremity edema.  Abdomen: Soft, non-tender, distended secondary to body habitus. Bowel sounds positive.  GU: Deferred. Musculoskeletal: No clubbing / cyanosis of digits/nails. No joint deformity upper and lower extremities. Skin: No rashes, lesions, ulcers. No induration; Warm and dry.  Neurologic: CN 2-12 grossly intact with no focal deficits. Romberg sign and cerebellar reflexes not assessed.  Psychiatric: Normal judgment and  insight. Alert and oriented x 3. Normal mood and appropriate affect.   Condition at discharge: stable  The results of significant diagnostics from this hospitalization (including imaging, microbiology, ancillary and laboratory) are listed below for reference.   Imaging Studies: ECHOCARDIOGRAM COMPLETE Result Date: 10/01/2023    ECHOCARDIOGRAM REPORT   Patient Name:   Chad [name] Date of Exam: 10/01/2023 Medical Rec #:  [id number]    Height:       68.0 in Accession #:    [id number]   Weight:  179.0 lb Date of Birth:  06/29/1946     BSA:          1.950 m Patient Age:    77 years     BP:           124/59 mmHg Patient Gender: M            HR:           54 bpm. Exam Location:  Inpatient Procedure: 2D Echo, Cardiac Doppler and Color Doppler (Both Spectral and Color            Flow Doppler were utilized during procedure). Indications:    R06.9 DOE  History:        Patient has prior history of Echocardiogram examinations, most                 recent 08/15/2023. Prior CABG; Risk Factors:Hypertension and                 Diabetes.                 Aortic Valve: 25 mm Liva Nova Perceval rapid                 deployment/sutureless bovine pericardial valve valve is present                 in the aortic position. Procedure Date: 08/10/23.  Sonographer:    Irving Burton Senior RDCS Referring Phys: Marguerita Merles, LATIF IMPRESSIONS  1. Left ventricular ejection fraction, by estimation, is 60 to 65%. The left ventricle has normal function. The left ventricle has no regional wall motion abnormalities. Left ventricular diastolic parameters were normal.  2. Right ventricular systolic function is normal. The right ventricular size is normal.  3. The mitral valve is abnormal. No evidence of mitral valve regurgitation. No evidence of mitral stenosis.  4. Post AVR with hybrid 25 mm Liva Nova Perceval rapid deployment valve normal gradients and no PVL. The aortic valve has been repaired/replaced. Aortic valve regurgitation is not visualized. No  aortic stenosis is present. There is a 25 mm Liva Nova Perceval rapid deployment/sutureless bovine pericardial valve valve present in the aortic position. Procedure Date: 08/10/23.  5. The inferior vena cava is normal in size with greater than 50% respiratory variability, suggesting right atrial pressure of 3 mmHg. FINDINGS  Left Ventricle: Left ventricular ejection fraction, by estimation, is 60 to 65%. The left ventricle has normal function. The left ventricle has no regional wall motion abnormalities. Strain was performed and the global longitudinal strain is indeterminate. The left ventricular internal cavity size was normal in size. There is no left ventricular hypertrophy. Left ventricular diastolic parameters were normal. Right Ventricle: The right ventricular size is normal. No increase in right ventricular wall thickness. Right ventricular systolic function is normal. Left Atrium: Left atrial size was normal in size. Right Atrium: Right atrial size was normal in size. Pericardium: There is no evidence of pericardial effusion. Mitral Valve: The mitral valve is abnormal. There is mild thickening of the mitral valve leaflet(s). There is mild calcification of the mitral valve leaflet(s). Mild mitral annular calcification. No evidence of mitral valve regurgitation. No evidence of mitral valve stenosis. Tricuspid Valve: The tricuspid valve is normal in structure. Tricuspid valve regurgitation is mild . No evidence of tricuspid stenosis. Aortic Valve: Post AVR with hybrid 25 mm Liva Nova Perceval rapid deployment valve normal gradients and no PVL. The aortic valve has been repaired/replaced. Aortic valve regurgitation is not  visualized. No aortic stenosis is present. Aortic valve mean gradient measures 10.0 mmHg. Aortic valve peak gradient measures 17.5 mmHg. Aortic valve area, by VTI measures 2.67 cm. There is a 25 mm Liva Nova Perceval rapid deployment/sutureless bovine pericardial valve valve present in the  aortic position. Procedure Date: 08/10/23. Pulmonic Valve: The pulmonic valve was normal in structure. Pulmonic valve regurgitation is not visualized. No evidence of pulmonic stenosis. Aorta: The aortic root is normal in size and structure. Venous: The inferior vena cava is normal in size with greater than 50% respiratory variability, suggesting right atrial pressure of 3 mmHg. IAS/Shunts: No atrial level shunt detected by color flow Doppler. Additional Comments: 3D was performed not requiring image post processing on an independent workstation and was indeterminate.  LEFT VENTRICLE PLAX 2D LVIDd:         3.90 cm   Diastology LVIDs:         2.60 cm   LV e' medial:    7.62 cm/s LV PW:         1.10 cm   LV E/e' medial:  11.5 LV IVS:        1.00 cm   LV e' lateral:   6.85 cm/s LVOT diam:     2.30 cm   LV E/e' lateral: 12.8 LV SV:         109 LV SV Index:   56 LVOT Area:     4.15 cm  RIGHT VENTRICLE RV S prime:     7.51 cm/s TAPSE (M-mode): 0.9 cm LEFT ATRIUM             Index        RIGHT ATRIUM           Index LA diam:        3.20 cm 1.64 cm/m   RA Area:     20.60 cm LA Vol (A2C):   56.4 ml 28.93 ml/m  RA Volume:   58.20 ml  29.85 ml/m LA Vol (A4C):   43.6 ml 22.36 ml/m LA Biplane Vol: 53.8 ml 27.60 ml/m  AORTIC VALVE AV Area (Vmax):    2.17 cm AV Area (Vmean):   2.33 cm AV Area (VTI):     2.67 cm AV Vmax:           209.00 cm/s AV Vmean:          151.000 cm/s AV VTI:            0.407 m AV Peak Grad:      17.5 mmHg AV Mean Grad:      10.0 mmHg LVOT Vmax:         109.00 cm/s LVOT Vmean:        84.700 cm/s LVOT VTI:          0.262 m LVOT/AV VTI ratio: 0.64  AORTA Ao Root diam: 2.90 cm Ao Asc diam:  3.00 cm MITRAL VALVE MV Area (PHT): 2.55 cm    SHUNTS MV Decel Time: 298 msec    Systemic VTI:  0.26 m MV E velocity: 87.50 cm/s  Systemic Diam: 2.30 cm MV A velocity: 95.90 cm/s MV E/A ratio:  0.91 Charlton Haws MD Electronically signed by Charlton Haws MD Signature Date/Time: 10/01/2023/12:36:48 PM    Final    DG  CHEST PORT 1 VIEW Result Date: 10/01/2023 CLINICAL DATA:  [id number] SOB (shortness of breath) on exertion [id number] EXAM: PORTABLE CHEST 1 VIEW COMPARISON:  09/30/2023 chest radiograph. FINDINGS: Intact sternotomy wires. TAVR in place.  Stable cardiomediastinal silhouette with mild cardiomegaly. No pneumothorax. Stable small left pleural effusion. No right pleural effusion. No overt pulmonary edema. Similar patchy left lung base opacity. IMPRESSION: 1. Stable mild cardiomegaly. No overt pulmonary edema. 2. Stable small left pleural effusion. 3. Similar patchy left lung base opacity. Electronically Signed   By: Delbert Phenix M.D.   On: 10/01/2023 11:21   DG CHEST PORT 1 VIEW Result Date: 09/30/2023 CLINICAL DATA:  Shortness of breath. EXAM: PORTABLE CHEST 1 VIEW COMPARISON:  September 29, 2023. FINDINGS: Stable cardiomediastinal silhouette. Increased left basilar opacity is noted concerning for pneumonia or atelectasis with possible small pleural effusion. Right lung is unremarkable. IMPRESSION: Increased left basilar opacity as noted above. Electronically Signed   By: Lupita Raider M.D.   On: 09/30/2023 14:18   CT CHEST WO CONTRAST Addendum Date: 09/29/2023 ADDENDUM REPORT: 09/29/2023 21:17 ADDENDUM: Addendum to add additional findings upon further review. Acute comminuted fracture of the left first rib. Additional nondisplaced fracture of the left second rib. These results were called by telephone at the time of interpretation on 09/29/2023 at 9:00 pm to provider Dr. Toniann Fail, Who verbally acknowledged these results. Electronically Signed   By: Minerva Fester M.D.   On: 09/29/2023 21:17   Result Date: 09/29/2023 CLINICAL DATA:  Shortness of breath low oxygen saturations. Open heart surgery in January 2025 EXAM: CT CHEST WITHOUT CONTRAST TECHNIQUE: Multidetector CT imaging of the chest was performed following the standard protocol without IV contrast. RADIATION DOSE REDUCTION: This exam was performed according to the  departmental dose-optimization program which includes automated exposure control, adjustment of the mA and/or kV according to patient size and/or use of iterative reconstruction technique. COMPARISON:  Same day chest radiographs and CT 09/23/2022 FINDINGS: Cardiovascular: Small pericardial effusion. Sternotomy with unhealed sternal fracture compatible with recent open heart surgery. TAVR. Coronary artery calcification. Aortic atherosclerotic calcification. Mediastinum/Nodes: Small amount of free fluid in the anterior mediastinum compatible with recent heart surgery. Trachea and esophagus are unremarkable. Evaluation for adenopathy is limited without IV contrast. Lungs/Pleura: Large left pleural effusion. Near total atelectasis of the left lower lobe and partial atelectasis of the left upper lobe. Patchy ground-glass opacities throughout the right lung. Small right pleural effusion. No pneumothorax. Upper Abdomen: No acute abnormality. Musculoskeletal: No acute fracture. IMPRESSION: 1. Large left pleural effusion with near total atelectasis of the left lower lobe and partial atelectasis of the left upper lobe. 2. Patchy ground-glass opacities throughout the right lung, favor atypical infection. 3. Small right pleural effusion. 4. Postoperative change of open heart surgery in January 2025. Small amount of residual fluid in the anterior mediastinum. No evidence of abscess. 5. Small pericardial effusion likely related to recent open heart surgery. Pericarditis is difficult to exclude. 6. Aortic Atherosclerosis (ICD10-I70.0). Electronically Signed: By: Minerva Fester M.D. On: 09/29/2023 19:18   DG Chest 2 View Result Date: 09/29/2023 CLINICAL DATA:  Open heart surgery in January 2025. Fluid on left lung since that time. EXAM: CHEST - 2 VIEW COMPARISON:  CTA chest 09/23/2022 FINDINGS: Normal cardiomediastinal silhouette. Sternotomy and CABG. TAVR. Moderate left pleural effusion and left basilar airspace opacities.  Right lung is clear. No pneumothorax. IMPRESSION: Moderate left pleural effusion and left basilar airspace opacities. Electronically Signed   By: Minerva Fester M.D.   On: 09/29/2023 19:19   DG Chest 2 View Result Date: 09/29/2023 CLINICAL DATA:  Status post open heart surgery in January 2025 presenting with shortness of breath and nonproductive cough. EXAM: CHEST - 2 VIEW  COMPARISON:  September 23, 2023 FINDINGS: Multiple sternal wires are present. The heart size and visualized mediastinal contours are within normal limits. An artificial aortic valve is suspected. A stable, moderate to large left-sided pleural effusion is seen. Underlying atelectasis and/or infiltrate is suspected. No pneumothorax is identified. Multilevel degenerative changes seen throughout the thoracic spine. IMPRESSION: Stable, moderate to large left-sided pleural effusion with suspected underlying atelectasis and/or infiltrate. Electronically Signed   By: Aram Candela M.D.   On: 09/29/2023 17:33   DG Chest 1 View Result Date: 09/29/2023 CLINICAL DATA:  78 year old male status post left-sided thoracentesis EXAM: CHEST  1 VIEW COMPARISON:  CT chest 09/29/2023, plain film 09/29/2023 FINDINGS: Cardiomediastinal silhouette unchanged. Surgical changes of median sternotomy, CABG, TAVR. Near complete resolution of left-sided pleural fluid. Some residual opacity at the pleuroparenchymal interface at the base of the left lung. No pneumothorax. No new airspace disease. Right lung relatively well aerated. IMPRESSION: No complicating features status post left-sided thoracentesis, with mild residual fluid/consolidation. Electronically Signed   By: Gilmer Mor D.O.   On: 09/29/2023 16:59   IR THORACENTESIS ASP PLEURAL SPACE W/IMG GUIDE Result Date: 09/29/2023 INDICATION: Recent cardiac surgery with large left pleural effusion. Request for diagnostic and therapeutic thoracentesis. EXAM: ULTRASOUND GUIDED LEFT THORACENTESIS MEDICATIONS: 1%  lidocaine 10 mL COMPLICATIONS: None immediate. PROCEDURE: An ultrasound guided thoracentesis was thoroughly discussed with the patient and questions answered. The benefits, risks, alternatives and complications were also discussed. The patient understands and wishes to proceed with the procedure. Written consent was obtained. Ultrasound was performed to localize and mark an adequate pocket of fluid in the left chest. The area was then prepped and draped in the normal sterile fashion. 1% Lidocaine was used for local anesthesia. Under ultrasound guidance a 6 Fr Safe-T-Centesis catheter was introduced. Thoracentesis was performed. The catheter was removed and a dressing applied. FINDINGS: A total of approximately 1.9 L of clear amber fluid was removed. Samples were sent to the laboratory as requested by the clinical team. IMPRESSION: Successful ultrasound guided left thoracentesis yielding 1.9 L of pleural fluid. Procedure performed by: Corrin Parker, PA-C Electronically Signed   By: Gilmer Mor D.O.   On: 09/29/2023 16:47   Intravitreal Injection, Pharmacologic Agent - OS - Left Eye Result Date: 09/14/2023 Time Out 09/14/2023. 3:31 PM. Confirmed correct patient, procedure, site, and patient consented. Anesthesia Topical anesthesia was used. Anesthetic medications included Lidocaine 2%, Proparacaine 0.5%. Procedure Preparation included 5% betadine to ocular surface, eyelid speculum. A (32g) needle was used. Injection: 1.25 mg Bevacizumab 1.25mg /0.73ml   Route: Intravitreal, Site: Left Eye   NDC: [id number], Lot: [id number], Expiration date: 10/14/2023 Post-op Post injection exam found visual acuity of at least counting fingers. The patient tolerated the procedure well. There were no complications. The patient received written and verbal post procedure care education. Post injection medications were not given.   OCT, Retina - OU - Both Eyes Result Date: 09/14/2023 Right Eye Quality was good. Central Foveal Thickness:  277. Progression has been stable. Findings include normal foveal contour, no IRF, no SRF, intraretinal hyper-reflective material, epiretinal membrane, macular pucker (Stable improvement in IRF / IRHM temporal macula and fovea ). Left Eye Quality was good. Central Foveal Thickness: 250. Progression has been stable. Findings include normal foveal contour, no SRF, intraretinal hyper-reflective material, intraretinal fluid (Stable improvement IRF/IRHM centrally, trace persistent cystic changes sup mac ). Notes *Images captured and stored on drive Diagnosis / Impression: +DME OU OD: Stable improvement in IRF / IRHM temporal macula and fovea  OS: Stable improvement IRF/IRHM centrally, trace persistent cystic changes sup mac Clinical management: See below Abbreviations: NFP - Normal foveal profile. CME - cystoid macular edema. PED - pigment epithelial detachment. IRF - intraretinal fluid. SRF - subretinal fluid. EZ - ellipsoid zone. ERM - epiretinal membrane. ORA - outer retinal atrophy. ORT - outer retinal tubulation. SRHM - subretinal hyper-reflective material. IRHM - intraretinal hyper-reflective material   Microbiology: Results for orders placed or performed during the hospital encounter of 09/29/23  Body fluid culture w Gram Stain     Status: None   Collection Time: 09/29/23  4:33 PM   Specimen: Abdomen; Peritoneal Fluid  Result Value Ref Range Status   Specimen Description PERITONEAL  Final   Special Requests NONE  Final   Gram Stain   Final    RARE WBC PRESENT, PREDOMINANTLY MONONUCLEAR NO ORGANISMS SEEN    Culture   Final    NO GROWTH 3 DAYS Performed at [facility], [address], [place], [place] [address]    Report Status 10/03/2023 FINAL  Final  Culture, blood (Routine X 2) w Reflex to ID Panel     Status: None (Preliminary result)   Collection Time: 09/30/23 10:34 AM   Specimen: BLOOD RIGHT HAND  Result Value Ref Range Status   Specimen Description BLOOD RIGHT HAND  Final    Special Requests   Final    BOTTLES DRAWN AEROBIC AND ANAEROBIC Blood Culture results may not be optimal due to an inadequate volume of blood received in culture bottles   Culture   Final    NO GROWTH 3 DAYS Performed at [facility], [address], [place], [place] [address]    Report Status PENDING  Incomplete  Culture, blood (Routine X 2) w Reflex to ID Panel     Status: None (Preliminary result)   Collection Time: 09/30/23 10:39 AM   Specimen: BLOOD LEFT HAND  Result Value Ref Range Status   Specimen Description BLOOD LEFT HAND  Final   Special Requests   Final    BOTTLES DRAWN AEROBIC AND ANAEROBIC Blood Culture results may not be optimal due to an inadequate volume of blood received in culture bottles   Culture   Final    NO GROWTH 3 DAYS Performed at [facility], [address], [place], [place] [address]    Report Status PENDING  Incomplete   Labs: CBC: Recent Labs  Lab 09/29/23 1830 09/30/23 0731 10/01/23 0734  WBC 7.8 7.2 6.7  NEUTROABS 6.1  --  4.7  HGB 10.1* 8.8* 8.8*  HCT 30.6* 26.5* 26.5*  MCV 88.2 87.2 87.7  PLT 116* 102* 90*   Basic Metabolic Panel: Recent Labs  Lab 09/29/23 1830 09/30/23 0731 10/01/23 0734  NA 133* 135 131*  K 4.9 4.0 3.7  CL 101 107 104  CO2 21* 19* 19*  GLUCOSE 295* 184* 136*  BUN 60* 54* 52*  CREATININE 3.89* 3.66* 3.61*  CALCIUM 9.7 8.8* 8.4*  MG  --   --  2.0  PHOS  --   --  4.4   Liver Function Tests: Recent Labs  Lab 09/30/23 0731 10/01/23 0734  AST 20 47*  ALT 31 44  ALKPHOS 66 63  BILITOT 0.7 0.4  PROT 5.0* 4.8*  ALBUMIN 2.1* 2.1*   CBG: Recent Labs  Lab 09/30/23 1734 09/30/23 2058 10/01/23 0814 10/01/23 1239 10/01/23 1632  GLUCAP 148* 183* 144* 225* 247*   Discharge time spent: greater than 30  minutes.  Signed: Marguerita Merles, DO [facility] 10/03/2023

## 2023-10-04 ENCOUNTER — Telehealth (HOSPITAL_COMMUNITY): Payer: Self-pay

## 2023-10-04 ENCOUNTER — Ambulatory Visit: Payer: PPO | Admitting: Cardiovascular Disease

## 2023-10-04 ENCOUNTER — Ambulatory Visit: Admitting: Physician Assistant

## 2023-10-04 ENCOUNTER — Encounter: Payer: Self-pay | Admitting: Cardiovascular Disease

## 2023-10-04 DIAGNOSIS — J90 Pleural effusion, not elsewhere classified: Secondary | ICD-10-CM

## 2023-10-04 NOTE — Telephone Encounter (Signed)
 Pt and daug called in regards to CR, adv both of note below. Pt next hosp. F/u appt is 3/25.

## 2023-10-05 DIAGNOSIS — Z951 Presence of aortocoronary bypass graft: Secondary | ICD-10-CM | POA: Diagnosis not present

## 2023-10-05 DIAGNOSIS — I350 Nonrheumatic aortic (valve) stenosis: Secondary | ICD-10-CM | POA: Diagnosis not present

## 2023-10-05 DIAGNOSIS — M6281 Muscle weakness (generalized): Secondary | ICD-10-CM | POA: Diagnosis not present

## 2023-10-05 DIAGNOSIS — R2681 Unsteadiness on feet: Secondary | ICD-10-CM | POA: Diagnosis not present

## 2023-10-05 DIAGNOSIS — R5381 Other malaise: Secondary | ICD-10-CM | POA: Diagnosis not present

## 2023-10-05 DIAGNOSIS — I25810 Atherosclerosis of coronary artery bypass graft(s) without angina pectoris: Secondary | ICD-10-CM | POA: Diagnosis not present

## 2023-10-05 LAB — CULTURE, BLOOD (ROUTINE X 2)
Culture: NO GROWTH
Culture: NO GROWTH

## 2023-10-06 DIAGNOSIS — D638 Anemia in other chronic diseases classified elsewhere: Secondary | ICD-10-CM | POA: Diagnosis not present

## 2023-10-06 DIAGNOSIS — N184 Chronic kidney disease, stage 4 (severe): Secondary | ICD-10-CM | POA: Diagnosis not present

## 2023-10-06 DIAGNOSIS — J90 Pleural effusion, not elsewhere classified: Secondary | ICD-10-CM | POA: Diagnosis not present

## 2023-10-06 DIAGNOSIS — R946 Abnormal results of thyroid function studies: Secondary | ICD-10-CM | POA: Diagnosis not present

## 2023-10-06 DIAGNOSIS — J189 Pneumonia, unspecified organism: Secondary | ICD-10-CM | POA: Diagnosis not present

## 2023-10-06 DIAGNOSIS — D696 Thrombocytopenia, unspecified: Secondary | ICD-10-CM | POA: Diagnosis not present

## 2023-10-06 DIAGNOSIS — J9601 Acute respiratory failure with hypoxia: Secondary | ICD-10-CM | POA: Diagnosis not present

## 2023-10-11 DIAGNOSIS — M6281 Muscle weakness (generalized): Secondary | ICD-10-CM | POA: Diagnosis not present

## 2023-10-11 DIAGNOSIS — Z951 Presence of aortocoronary bypass graft: Secondary | ICD-10-CM | POA: Diagnosis not present

## 2023-10-11 DIAGNOSIS — R5381 Other malaise: Secondary | ICD-10-CM | POA: Diagnosis not present

## 2023-10-11 DIAGNOSIS — R2681 Unsteadiness on feet: Secondary | ICD-10-CM | POA: Diagnosis not present

## 2023-10-11 NOTE — Telephone Encounter (Signed)
 Pt' son Jeannett Senior is calling back to check the status of this message. Please call him back at [phone]

## 2023-10-12 DIAGNOSIS — I350 Nonrheumatic aortic (valve) stenosis: Secondary | ICD-10-CM | POA: Diagnosis not present

## 2023-10-12 DIAGNOSIS — M6281 Muscle weakness (generalized): Secondary | ICD-10-CM | POA: Diagnosis not present

## 2023-10-12 DIAGNOSIS — I25810 Atherosclerosis of coronary artery bypass graft(s) without angina pectoris: Secondary | ICD-10-CM | POA: Diagnosis not present

## 2023-10-13 DIAGNOSIS — R2681 Unsteadiness on feet: Secondary | ICD-10-CM | POA: Diagnosis not present

## 2023-10-13 DIAGNOSIS — Z951 Presence of aortocoronary bypass graft: Secondary | ICD-10-CM | POA: Diagnosis not present

## 2023-10-13 DIAGNOSIS — M6281 Muscle weakness (generalized): Secondary | ICD-10-CM | POA: Diagnosis not present

## 2023-10-13 DIAGNOSIS — R7989 Other specified abnormal findings of blood chemistry: Secondary | ICD-10-CM | POA: Diagnosis not present

## 2023-10-13 DIAGNOSIS — R5381 Other malaise: Secondary | ICD-10-CM | POA: Diagnosis not present

## 2023-10-13 DIAGNOSIS — R946 Abnormal results of thyroid function studies: Secondary | ICD-10-CM | POA: Diagnosis not present

## 2023-10-15 ENCOUNTER — Telehealth (HOSPITAL_COMMUNITY): Payer: Self-pay

## 2023-10-15 ENCOUNTER — Encounter (HOSPITAL_COMMUNITY): Payer: Self-pay

## 2023-10-15 NOTE — Telephone Encounter (Signed)
 Pt insurance is active and benefits verified through [employer]. Co-pay $15, DED $0/$0 met, out of pocket $3,400/$645.89 met, co-insurance 0%. No pre-authorization required. Ezekial/[employer] 10/15/23 @ 2:26p, REF# [id number]  How many CR sessions are covered? (36 visits for TCR, 72 visits for ICR)72 Is this a lifetime maximum or an annual maximum? Annual Has the member used any of these services to date? No Is there a time limit (weeks/months) on start of program and/or program completion? No

## 2023-10-15 NOTE — Telephone Encounter (Signed)
 Pt returned CR phone call and stated he is interested in CR. Patient will come in for orientation on 10/21/23 @ 1:15PM and will attend the 1:45PM exercise class. Went over insurance, patient verbalized understanding.   Pensions consultant.

## 2023-10-15 NOTE — Telephone Encounter (Signed)
 Attempted to call patient in regards to Cardiac Rehab - LM on VM Mailed letter

## 2023-10-16 ENCOUNTER — Other Ambulatory Visit: Payer: Self-pay | Admitting: Neurology

## 2023-10-17 ENCOUNTER — Other Ambulatory Visit: Payer: Self-pay | Admitting: Neurology

## 2023-10-18 DIAGNOSIS — M6281 Muscle weakness (generalized): Secondary | ICD-10-CM | POA: Diagnosis not present

## 2023-10-18 DIAGNOSIS — Z951 Presence of aortocoronary bypass graft: Secondary | ICD-10-CM | POA: Diagnosis not present

## 2023-10-18 DIAGNOSIS — R5381 Other malaise: Secondary | ICD-10-CM | POA: Diagnosis not present

## 2023-10-18 DIAGNOSIS — R2681 Unsteadiness on feet: Secondary | ICD-10-CM | POA: Diagnosis not present

## 2023-10-19 ENCOUNTER — Ambulatory Visit: Attending: Cardiology | Admitting: Cardiology

## 2023-10-19 ENCOUNTER — Encounter: Payer: Self-pay | Admitting: Cardiology

## 2023-10-19 ENCOUNTER — Ambulatory Visit (INDEPENDENT_AMBULATORY_CARE_PROVIDER_SITE_OTHER)

## 2023-10-19 VITALS — BP 104/66 | HR 53 | Ht 68.0 in | Wt 181.0 lb

## 2023-10-19 DIAGNOSIS — I472 Ventricular tachycardia, unspecified: Secondary | ICD-10-CM | POA: Diagnosis not present

## 2023-10-19 DIAGNOSIS — I25810 Atherosclerosis of coronary artery bypass graft(s) without angina pectoris: Secondary | ICD-10-CM | POA: Diagnosis not present

## 2023-10-19 DIAGNOSIS — D6869 Other thrombophilia: Secondary | ICD-10-CM

## 2023-10-19 DIAGNOSIS — I4891 Unspecified atrial fibrillation: Secondary | ICD-10-CM | POA: Diagnosis not present

## 2023-10-19 DIAGNOSIS — Z952 Presence of prosthetic heart valve: Secondary | ICD-10-CM

## 2023-10-19 DIAGNOSIS — Z951 Presence of aortocoronary bypass graft: Secondary | ICD-10-CM

## 2023-10-19 DIAGNOSIS — M6281 Muscle weakness (generalized): Secondary | ICD-10-CM | POA: Diagnosis not present

## 2023-10-19 DIAGNOSIS — I350 Nonrheumatic aortic (valve) stenosis: Secondary | ICD-10-CM | POA: Diagnosis not present

## 2023-10-19 NOTE — Progress Notes (Addendum)
 Electrophysiology Office Note:   Date:  10/19/2023  ID:  Chad [name], DOB 1946/07/16, MRN [id number]  Primary Cardiologist: Charlton Haws, MD Electrophysiologist: Nobie Putnam, MD      History of Present Illness:   Chad [name] is a 78 y.o. male with h/o HTN, HLD, CKD IV (baseline Cr 1.8), DM2, AS and CAD s/p CABG and AVR at [facility] on 08/10/23 who is being seen today for evaluation of his atrial fibrillation and ventricular ectopy.   Discussed the use of AI scribe software for clinical note transcription with the patient, who gave verbal consent to proceed. History of Present Illness Chad [name] is a 78 year old male with atrial fibrillation who presents for follow-up after surgery. He was referred by Dr. Eden Emms for follow-up of atrial fibrillation and abnormal rhythms post-surgery. He presents today with his wife, and his son who is a physician is communicating via FaceTime. He experienced both paroxysms of atrial fibrillation and ventricular arrhythmias immediately following cardiac surgery. Initially, he was treated with a full 10g load of amiodarone and mexiletine. A heart monitor was placed upon discharge, which he wore for over a week in January. He has since been weaned off both mexiletine and amiodarone. He thinks the arrhythmias have since resolved, and he has not experienced any episodes of heart racing, skipping, or fluttering since stopping the medications. He did have symptoms associated with arrhythmias while in the hospital. He associates tremors with the use of amiodarone, which have improved since discontinuation of the medication about a month ago. He thinks that he is recovering well and has progressed from using a walker two weeks ago to ambulating independently.He is hoping to start cardiac rehab.   Review of systems complete and found to be negative unless listed in HPI.   EP Information / Studies Reviewed:    EKG is ordered today. Personal review as below.  EKG  Interpretation Date/Time:  Tuesday October 19 2023 15:47:53 EDT Ventricular Rate:  50 PR Interval:  164 QRS Duration:  80 QT Interval:  326 QTC Calculation: 297 R Axis:   52  Text Interpretation: Sinus bradycardia Cannot rule out Anterior infarct (cited on or before 29-Sep-2023) When compared with ECG of 29-Sep-2023 14:32,QT has shortened, less artifact, no significant change. Confirmed by Nobie Putnam [phone]) on 10/19/2023 5:02:52 PM   Echo 10/01/23:   1. Left ventricular ejection fraction, by estimation, is 60 to 65%. The  left ventricle has normal function. The left ventricle has no regional  wall motion abnormalities. Left ventricular diastolic parameters were  normal.   2. Right ventricular systolic function is normal. The right ventricular  size is normal.   3. The mitral valve is abnormal. No evidence of mitral valve  regurgitation. No evidence of mitral stenosis.   4. Post AVR with hybrid 25 mm Liva Nova Perceval rapid deployment valve  normal gradients and no PVL. The aortic valve has been repaired/replaced.  Aortic valve regurgitation is not visualized. No aortic stenosis is  present. There is a 25 mm Liva Nova  Perceval rapid deployment/sutureless bovine pericardial valve valve  present in the aortic position. Procedure Date: 08/10/23.   5. The inferior vena cava is normal in size with greater than 50%  respiratory variability, suggesting right atrial pressure of 3 mmHg.   Holter Monitor 08/21/23:  IMPRESSION:  1. The predominant rhythm was Sinus. 2. The Maximum Heart Rate recorded was 232 bpm, 01/26 07:52:53, the Minimum Heart Rate recorded was 47 bpm, 01/29 12:36:49,  and the Average Heart Rate was 61 bpm. 3. Rare VE beats with a burden of <1 %. There were 48  occurrences of Ventricular Tachycardia with the Fastest episode 232 bpm, 01/26 07:52:51, and the Longest episode 10 beats, 01/26 07:52:51. 4. Occasional SVE beats with a burden of 2 %. There were 203 occurrences of  Supraventricular Tachycardia with the  Fastest episode 147 bpm, 01/25 16:52:37, and the Longest episode 6 beats, 01/26 13:17:39. 5. There was 1 Patient Trigger which was not associated with arrhythmia.   Risk Assessment/Calculations:    CHA2DS2-VASc Score = 5   This indicates a 7.2% annual risk of stroke. The patient's score is based upon: CHF History: 0 HTN History: 1 Diabetes History: 1 Stroke History: 0 Vascular Disease History: 1 Age Score: 2 Gender Score: 0             Physical Exam:   VS:  BP 104/66   Pulse (!) 53   Ht 5\' 8"  (1.727 m)   Wt 181 lb (82.1 kg)   SpO2 92%   BMI 27.52 kg/m    Wt Readings from Last 3 Encounters:  10/19/23 181 lb (82.1 kg)  09/29/23 179 lb (81.2 kg)  09/10/23 179 lb (81.2 kg)     GEN: Well nourished, well developed in no acute distress NECK: No JVD CARDIAC: Bradycardic, regular rhythm.  RESPIRATORY:  Clear to auscultation without rales, wheezing or rhonchi  ABDOMEN: Soft, non-distended EXTREMITIES:  No edema; No deformity   ASSESSMENT AND PLAN:    Assessment & Plan #Post-operative atrial fibrillation: Following cardiac surgery, likely related to inflammation post-surgery but does have risk factors for AF. Symptomatic during episodes. Now off amiodarone. No known recurrences.  #Secondary hypercoagulable state due to atrial fibrillation: CHADSVASC score of 5. He has not been on anti-coagulation since surgery.  -Repeat 2 week Zio monitor now that patient has stopped all medications. Amiodarone stopped 1 month ago.  -Encouraged patient to find a means to monitor heart rates and rhythm long-term: either wearable device such as a watch or Kardia mobile device. Loop recorder could also be considered if patient cannot find an alternative.  -Will start oral anti-coagulation if he has any AF.  -Continue metoprolol tartrate 12.5mg  BID.   #Ventricular Tachycardia: Reportedly short episodes post cardiac surgery, possibly reperfusion or  inflammatory etiology. He had non-sustained episodes on Zio monitor immediately after surgery, longest 10 beats. Now off amiodarone and mexiletine. No known recurrences.  -Repeat 2 week Zio monitor now that patient has stopped all medications. Amiodarone stopped 1 month ago.  -Encouraged patient to find a means to monitor heart rates and rhythm long-term: either wearable device such as a watch or Kardia mobile device. Loop recorder could also be considered if patient cannot find an alternative.  -Continue metoprolol tartrate 12.5mg  BID.   #CAD s/p CABG and AVR at [facility] on 08/10/23: Recovering slowly. Well compensated today. LVEF 60-65%.  - No EP contraindication to participating in cardiac rehab.    Follow up with Dr. Jimmey Ralph in 6 months   Total time of encounter: 63 minutes total time of encounter, including chart review, face-to-face patient care, coordination of care and counseling regarding high complexity medical decision making.   Signed, Nobie Putnam, MD

## 2023-10-19 NOTE — Patient Instructions (Addendum)
 Medication Instructions:  Your physician recommends that you continue on your current medications as directed. Please refer to the Current Medication list given to you today.  *If you need a refill on your cardiac medications before your next appointment, please call your pharmacy*  Testing/Procedures: Event Monitor Your physician has recommended that you wear an event monitor. Event monitors are medical devices that record the heart's electrical activity. Doctors most often [place] these monitors to diagnose arrhythmias. Arrhythmias are problems with the speed or rhythm of the heartbeat. The monitor is a small, portable device. You can wear one while you do your normal daily activities. This is usually used to diagnose what is causing palpitations/syncope (passing out).  Follow-Up: At [facility], you and your health needs are our priority.  As part of our continuing mission to provide you with exceptional heart care, we have created designated Provider Care Teams.  These Care Teams include your primary Cardiologist (physician) and Advanced Practice Providers (APPs -  Physician Assistants and Nurse Practitioners) who all work together to provide you with the care you need, when you need it.  Your next appointment:   6 months  Provider:   Nobie Putnam, MD    Other Instructions Dr. Jimmey Ralph recommends that you purchase a device for monitoring your heart rate/rhythm - Smart Watch or [facility] for monitoring of EKGs at home: You can look into the [facility] device by [employer].This device is purchased by you and it connects to an application you download to your smart phone.  It can detect abnormal heart rhythms and alert you to contact your doctor for further evaluation. The device is approximately $90 and the phone application is free.  The web site is:  [email or web address]://www.alivecor.com    ZIO XT- Long Term Monitor Instructions  Your physician has requested you wear a ZIO patch  monitor for 14 days.  This is a single patch monitor. [employer] supplies one patch monitor per enrollment. Additional stickers are not available. Please do not apply patch if you will be having a Nuclear Stress Test,  Echocardiogram, Cardiac CT, MRI, or Chest Xray during the period you would be wearing the  monitor. The patch cannot be worn during these tests. You cannot remove and re-apply the  ZIO XT patch monitor.  Your ZIO patch monitor will be mailed 3 day USPS to your address on file. It may take 3-5 days  to receive your monitor after you have been enrolled.  Once you have received your monitor, please review the enclosed instructions. Your monitor  has already been registered assigning a specific monitor serial # to you.  Billing and Patient Assistance Program Information  We have supplied [employer] with any of your insurance information on file for billing purposes. [employer] offers a sliding scale Patient Assistance Program for patients that do not have  insurance, or whose insurance does not completely cover the cost of the ZIO monitor.  You must apply for the Patient Assistance Program to qualify for this discounted rate.  To apply, please call [employer] at [phone], select option 4, select option 2, ask to apply for  Patient Assistance Program. Meredeth Ide will ask your household income, and how many people  are in your household. They will quote your out-of-pocket cost based on that information.  [employer] will also be able to set up a 30-month, interest-free payment plan if needed.  Applying the monitor   Shave hair from upper left chest.  Hold abrader disc by orange  tab. Rub abrader in 40 strokes over the upper left chest as  indicated in your monitor instructions.  Clean area with 4 enclosed alcohol pads. Let dry.  Apply patch as indicated in monitor instructions. Patch will be placed under collarbone on left  side of chest with arrow pointing upward.  Rub patch adhesive wings  for 2 minutes. Remove white label marked "1". Remove the white  label marked "2". Rub patch adhesive wings for 2 additional minutes.  While looking in a mirror, press and release button in center of patch. A small green light will  flash 3-4 times. This will be your only indicator that the monitor has been turned on.  Do not shower for the first 24 hours. You may shower after the first 24 hours.  Press the button if you feel a symptom. You will hear a small click. Record Date, Time and  Symptom in the Patient Logbook.  When you are ready to remove the patch, follow instructions on the last 2 pages of Patient  Logbook. Stick patch monitor onto the last page of Patient Logbook.  Place Patient Logbook in the blue and white box. Use locking tab on box and tape box closed  securely. The blue and white box has prepaid postage on it. Please place it in the mailbox as  soon as possible. Your physician should have your test results approximately 7 days after the  monitor has been mailed back to [facility].  Call [facility] Customer Care at [phone] if you have questions regarding  your ZIO XT patch monitor. Call them immediately if you see an orange light blinking on your  monitor.  If your monitor falls off in less than 4 days, contact our Monitor department at [phone].  If your monitor becomes loose or falls off after 4 days call [employer] at [phone] for  suggestions on securing your monitor

## 2023-10-19 NOTE — Progress Notes (Unsigned)
 Enrolled patient for a 14 day Zio XT  monitor to be mailed to patients home

## 2023-10-19 NOTE — Progress Notes (Unsigned)
 Cardiology Office Note    Patient Name: Chad [name] Date of Encounter: 10/20/2023  Primary Care Provider:  Renford Dills, MD Primary Cardiologist:  Charlton Haws, MD Primary Electrophysiologist: Nobie Putnam, MD   Past Medical History    Past Medical History:  Diagnosis Date   Cataract    CKD (chronic kidney disease)    Colon polyps    Diabetes [facility])    Diabetic polyneuropathy associated with type 2 diabetes mellitus (HCC) 01/15/2017   Diabetic retinopathy (HCC)    GERD (gastroesophageal reflux disease)    Hypercholesterolemia    Hypertension    Hypertensive retinopathy    Hypothyroidism    Polyneuropathy    Proteinuria    Severe aortic stenosis     History of Present Illness  Chad [name] is a 78 y.o. male with a PMH of postop AF, CAD s/p CABG with LIMA to LAD, SVG OM and AVR 08/10/2023, polyneuropathy DM type II, hypothyroidism, aortic stenosis, HLD, HTN, CKD stage IV who presents today for posthospital follow-up.  Chad [name] has been followed by Dr. Eden Emms since 2019 for management of CAD.  He underwent an ETT on 09/03/2017 that was normal.  2D echo was completed showing EF of 60 to 65% with mild AS with mean gradient of 18 mmHg and peak at 31 mm.  He had elevated BP and was started on beta-blocker and Norvasc and ACE held due to renal issues.  He was referred to Dr. Lynnette Caffey for symptomatic severe aortic stenosis and was unfortunately found to have significant CAD on 3 TAVR heart cath.  He was found not to be a good candidate for CABG/AVR and seek a second opinion at [facility] and underwent bypass and AVR on 08/10/2023 by Dr. Kizzie Bane.  During his postop course he developed AKI with a peak creatinine of 5 and also developed atrial fibrillation with ventricular ectopy that was treated with amiodarone and mexiletine.  He was not started on anticoagulation and was given a referral to EP.  He was discharged with event monitor was weaned off both mexiletine and amiodarone.  He was last seen by  Dr. Eden Emms on 09/10/2023 and underwent 2D echo to assess baseline gradient after AVR.  He contacted the office on 3//25 with complaint of shortness of breath with chest x-ray completed showing large pleural effusion on the left side.  He was advised to go to the ED for further evaluation and to arrange for left thoracentesis.  He had a CT of the chest completed that verified large left pleural effusion with right-sided infiltrates concerning for pneumonia.  He was started on antibiotics thoracentesis completed with total of 1.9 L of fluid removed.  He reported following the procedure improved breathing and underwent 2D echo that showed EF of 60 to 65% with normal LV and RV with post AVR showing normal gradients.  He was not discharged on Lasix due to being euvolemic on exam.  He was seen by Dr. Jimmey Ralph on 10/19/2023 for evaluation of AF.  During his visit he reported no recurrence of AF since his hospital discharge.  He was placed on a 2-week event monitor for further evaluation with plan to start anticoagulant if AF recurs.  He was continued on metoprolol to tartrate 12.5 mg twice daily.  Chad [name] presents today with his wife and son Chad [name] via telephone for follow-up visit.  He reports doing well since his previous hospitalization but is concerned about the potential reaccumulation of fluid in his lungs, as he occasionally  experiences shortness of breath similar to prior episodes. He has not taken Lasix since his hospitalization and his weight has remained stable. No current chest pain, fever, chills, or significant cough with phlegm. He continues on metoprolol to manage his heart rate. He feels tired after physical activities such as walking or chores, which may be related to his current medication regimen. He has been on metoprolol 12.5 mg twice daily, His blood pressure has been running low, with readings in the low 100s to 110s, and one instance of 94/49. He was previously on 5 mg of Norvasc, which was  increased to 10 mg due to high blood pressure, but now there is consideration to adjust the dosage due to the low readings. He has a history of diabetes and uses compression socks to manage leg swelling. The swelling does not change overnight and is more pronounced in one leg, possibly due to previous surgical interventions. He uses Lasix as needed for significant swelling. He maintains adequate hydration and reports stable blood sugars, with occasional spikes. He has completed his course of antibiotics without issues. Patient denies chest pain, palpitations, dyspnea, PND, orthopnea, nausea, vomiting, dizziness, syncope, edema, weight gain, or early satiety.  Discussed the use of AI scribe software for clinical note transcription with the patient, who gave verbal consent to proceed.  History of Present Illness   Review of Systems  Please see the history of present illness.    All other systems reviewed and are otherwise negative except as noted above.  Physical Exam    Wt Readings from Last 3 Encounters:  10/20/23 179 lb 6.4 oz (81.4 kg)  10/19/23 181 lb (82.1 kg)  09/29/23 179 lb (81.2 kg)   VS: Vitals:   10/20/23 1117  BP: 128/70  Pulse: (!) 53  SpO2: 96%  ,Body mass index is 27.28 kg/m. GEN: Well nourished, well developed in no acute distress Neck: No JVD; No carotid bruits Pulmonary: Clear to auscultation without rales, wheezing or rhonchi  Cardiovascular: Normal rate. Regular rhythm. Normal S1. Normal S2.   Murmurs: There is no murmur.  ABDOMEN: Soft, non-tender, non-distended EXTREMITIES: +1 bilateral feet  EKG/LABS/ Recent Cardiac Studies   ECG personally reviewed by me today -none completed today  Risk Assessment/Calculations:          Lab Results  Component Value Date   WBC 6.7 10/01/2023   HGB 8.8 (L) 10/01/2023   HCT 26.5 (L) 10/01/2023   MCV 87.7 10/01/2023   PLT 90 (L) 10/01/2023   Lab Results  Component Value Date   CREATININE 3.61 (H) 10/01/2023    BUN 52 (H) 10/01/2023   NA 131 (L) 10/01/2023   K 3.7 10/01/2023   CL 104 10/01/2023   CO2 19 (L) 10/01/2023   No results found for: "CHOL", "HDL", "LDLCALC", "LDLDIRECT", "TRIG", "CHOLHDL"  No results found for: "HGBA1C" Assessment & Plan    1.CAD s/p CABG/AVR:  -CABG with LIMA to LAD, SVG OM and AVR 08/10/2023 at [facility] -Patient was previously cleared for cardiac rehab -He was admitted on 09/29/2023 and underwent pericardiocentesis for pleural effusion -Today patient reports no chest pain or angina since previous follow-up. -Continue current GDMT with ASA 81 mg, Toprol-XL 25 mg, Crestor 20 mg -SBE prophylaxis discussed -Continue as needed Lasix 20 mg  2.  Postop AF with ventricular ectopy: -Patient reports no recurrence or episodes of tachycardia since hospital follow-up. -Patient recently evaluated by EP with repeat event monitor placed and plan to initiate anticoagulation if AF is present. -  Continue Toprol-XL 25 mg  3.  Essential hypertension: -Patient's blood pressure today was stable at 128/70 with low BP per log at home. - Reduce amlodipine to 5 mg daily. - Monitor blood pressure with provided log. - Adjust medication based on blood pressure trends.  4.  DM type II: -Patient's last hemoglobin A1c was 6.1 -Continue current treatment plan per PCP  5.  CKD stage IV: -Patient's last creatinine was 3.6 -Check BMET today -Continue treatment plan per nephrology  6. Pleural Effusion: Left-sided effusion previously drained. Monitoring for reaccumulation due to shortness of breath. No Lasix as fluid volume stable. - Order chest x-ray to assess for reaccumulation of pleural fluid. - Monitor weight and fluid status. - Advise on signs of fluid accumulation and when to use Lasix as needed.     Disposition: Follow-up with Charlton Haws, MD as scheduled    Signed, Napoleon Form, Leodis Rains, NP 10/20/2023, 11:41 AM [facility]

## 2023-10-20 ENCOUNTER — Ambulatory Visit
Admission: RE | Admit: 2023-10-20 | Discharge: 2023-10-20 | Disposition: A | Discharge status: Home / Self Care | Source: Ambulatory Visit | Attending: Nurse Practitioner | Admitting: Nurse Practitioner

## 2023-10-20 ENCOUNTER — Ambulatory Visit: Attending: Nurse Practitioner | Admitting: Nurse Practitioner

## 2023-10-20 ENCOUNTER — Encounter: Payer: Self-pay | Admitting: Nurse Practitioner

## 2023-10-20 VITALS — BP 128/70 | HR 53 | Ht 68.0 in | Wt 179.4 lb

## 2023-10-20 DIAGNOSIS — Z952 Presence of prosthetic heart valve: Secondary | ICD-10-CM

## 2023-10-20 DIAGNOSIS — I4891 Unspecified atrial fibrillation: Secondary | ICD-10-CM | POA: Diagnosis not present

## 2023-10-20 DIAGNOSIS — E1159 Type 2 diabetes mellitus with other circulatory complications: Secondary | ICD-10-CM

## 2023-10-20 DIAGNOSIS — Z794 Long term (current) use of insulin: Secondary | ICD-10-CM | POA: Diagnosis not present

## 2023-10-20 DIAGNOSIS — J90 Pleural effusion, not elsewhere classified: Secondary | ICD-10-CM

## 2023-10-20 DIAGNOSIS — M6281 Muscle weakness (generalized): Secondary | ICD-10-CM | POA: Diagnosis not present

## 2023-10-20 DIAGNOSIS — J189 Pneumonia, unspecified organism: Secondary | ICD-10-CM | POA: Diagnosis not present

## 2023-10-20 DIAGNOSIS — Z951 Presence of aortocoronary bypass graft: Secondary | ICD-10-CM | POA: Diagnosis not present

## 2023-10-20 DIAGNOSIS — E118 Type 2 diabetes mellitus with unspecified complications: Secondary | ICD-10-CM

## 2023-10-20 DIAGNOSIS — R2681 Unsteadiness on feet: Secondary | ICD-10-CM | POA: Diagnosis not present

## 2023-10-20 DIAGNOSIS — I152 Hypertension secondary to endocrine disorders: Secondary | ICD-10-CM

## 2023-10-20 DIAGNOSIS — R5381 Other malaise: Secondary | ICD-10-CM | POA: Diagnosis not present

## 2023-10-20 DIAGNOSIS — N184 Chronic kidney disease, stage 4 (severe): Secondary | ICD-10-CM

## 2023-10-20 MED ORDER — AMLODIPINE BESYLATE 5 MG PO TABS: 5.0000 mg | ORAL_TABLET | Freq: Every day | ORAL | 1 refills | Status: AC

## 2023-10-20 MED ORDER — METOPROLOL SUCCINATE ER 25 MG PO TB24: 25.0000 mg | ORAL_TABLET | Freq: Every day | ORAL | 1 refills | Status: DC

## 2023-10-20 NOTE — Patient Instructions (Addendum)
 Medication Instructions:  DECREASE Norvasc (Amlodipine) to 5mg  once a day  START Toprol XL Take 1 tablet once a once a day at night  STOP Metoprolol Tartrate *If you need a refill on your cardiac medications before your next appointment, please call your pharmacy*   Lab Work: TODAY-BMET, LFT, CBC If you have labs (blood work) drawn today and your tests are completely normal, you will receive your results only by: MyChart Message (if you have MyChart) OR A paper copy in the mail If you have any lab test that is abnormal or we need to change your treatment, we will call you to review the results.   Testing/Procedures: A chest x-ray takes a picture of the organs and structures inside the chest, including the heart, lungs, and blood vessels. This test can show several things, including, whether the heart is enlarges; whether fluid is building up in the lungs; and whether pacemaker / defibrillator leads are still in place.   Follow-Up: At [facility], you and your health needs are our priority.  As part of our continuing mission to provide you with exceptional heart care, we have created designated Provider Care Teams.  These Care Teams include your primary Cardiologist (physician) and Advanced Practice Providers (APPs -  Physician Assistants and Nurse Practitioners) who all work together to provide you with the care you need, when you need it.  We recommend signing up for the patient portal called "MyChart".  Sign up information is provided on this After Visit Summary.  MyChart is used to connect with patients for Virtual Visits (Telemedicine).  Patients are able to view lab/test results, encounter notes, upcoming appointments, etc.  Non-urgent messages can be sent to your provider as well.   To learn more about what you can do with MyChart, go to [email or web address].    Your next appointment:   FOLLOW UP AS SCHEDULED   Provider:   Charlton Haws, MD     Other  Instructions Check your blood pressure daily for 2 weeks, then contact the office with your readings.  Contact the office either by phone or MyChart with your readings.  Make sure to check your blood pressure 2 hours after taking your medications.   AVOID these things for 30 minutes before checking your blood pressure: No Drinking caffeine. No Drinking alcohol. No Eating. No Smoking. No Exercising.  Five minutes before checking your blood pressure: Pee. Sit in a dining chair. Avoid sitting in a soft couch or armchair. Be quiet. Do not talk.    1st Floor: - Lobby - Registration  - Pharmacy  - Lab - Cafe  2nd Floor: - PV Lab - Diagnostic Testing (echo, CT, nuclear med)  3rd Floor: - Vacant  4th Floor: - TCTS (cardiothoracic surgery) - AFib Clinic - Structural Heart Clinic - Vascular Surgery  - Vascular Ultrasound  5th Floor: - HeartCare Cardiology (general and EP) - Clinical Pharmacy for coumadin, hypertension, lipid, weight-loss medications, and med management appointments    Valet parking services will be available as well.

## 2023-10-21 ENCOUNTER — Encounter (HOSPITAL_COMMUNITY)
Admission: RE | Admit: 2023-10-21 | Discharge: 2023-10-21 | Disposition: A | Discharge status: Home / Self Care | Source: Ambulatory Visit | Attending: Cardiovascular Disease | Admitting: Cardiovascular Disease

## 2023-10-21 VITALS — BP 118/62 | HR 51 | Ht 68.0 in | Wt 184.7 lb

## 2023-10-21 DIAGNOSIS — Z952 Presence of prosthetic heart valve: Secondary | ICD-10-CM | POA: Insufficient documentation

## 2023-10-21 DIAGNOSIS — Z951 Presence of aortocoronary bypass graft: Secondary | ICD-10-CM | POA: Diagnosis not present

## 2023-10-21 LAB — BASIC METABOLIC PANEL WITH GFR
BUN/Creatinine Ratio: 12 (ref 10–24)
BUN: 41 mg/dL — ABNORMAL HIGH (ref 8–27)
CO2: 19 mmol/L — ABNORMAL LOW (ref 20–29)
Calcium: 8.8 mg/dL (ref 8.6–10.2)
Chloride: 104 mmol/L (ref 96–106)
Creatinine, Ser: 3.55 mg/dL — ABNORMAL HIGH (ref 0.76–1.27)
Glucose: 160 mg/dL — ABNORMAL HIGH (ref 70–99)
Potassium: 4.7 mmol/L (ref 3.5–5.2)
Sodium: 136 mmol/L (ref 134–144)
eGFR: 17 mL/min/{1.73_m2} — ABNORMAL LOW (ref >59)

## 2023-10-21 LAB — HEPATIC FUNCTION PANEL
ALT: 17 IU/L (ref 0–44)
AST: 20 IU/L (ref 0–40)
Albumin: 3.7 g/dL — ABNORMAL LOW (ref 3.8–4.8)
Alkaline Phosphatase: 74 IU/L (ref 44–121)
Bilirubin Total: 0.5 mg/dL (ref 0.0–1.2)
Bilirubin, Direct: 0.19 mg/dL (ref 0.00–0.40)
Total Protein: 6.1 g/dL (ref 6.0–8.5)

## 2023-10-21 LAB — GLUCOSE, CAPILLARY: Glucose-Capillary: 150 mg/dL — ABNORMAL HIGH (ref 70–99)

## 2023-10-21 LAB — CBC
Hematocrit: 32.5 % — ABNORMAL LOW (ref 37.5–51.0)
Hemoglobin: 10.4 g/dL — ABNORMAL LOW (ref 13.0–17.7)
MCH: 30.2 pg (ref 26.6–33.0)
MCHC: 32 g/dL (ref 31.5–35.7)
MCV: 95 fL (ref 79–97)
Platelets: 101 10*3/uL — ABNORMAL LOW (ref 150–450)
RBC: 3.44 x10E6/uL — ABNORMAL LOW (ref 4.14–5.80)
RDW: 15 % (ref 11.6–15.4)
WBC: 6.4 10*3/uL (ref 3.4–10.8)

## 2023-10-21 NOTE — Progress Notes (Signed)
 Cardiac Rehab Medication Review by a Nurse  Does the patient  feel that his/her medications are working for him/her?  yes  Has the patient been experiencing any side effects to the medications prescribed?  no  Does the patient measure his/her own blood pressure or blood glucose at home?  yes   Does the patient have any problems obtaining medications due to transportation or finances?   no  Understanding of regimen: excellent Understanding of indications: excellent Potential of compliance: excellent    Nurse comments: Chad [name] is taking his medications as prescribed and has a good understanding of what his medications are for. Chad [name] checks his blood sugars and blood pressures once a day.    [name] RN 10/21/2023 1:17 PM

## 2023-10-21 NOTE — Progress Notes (Addendum)
 Patient here for cardiac rehab orientation. Resting heart rate and exertional heart rate noted in the low 50's as patient has a history of sinus brady  and is previously documented. Patient completed 6 minute nustep test as he reported falling over this morning picking up something and scraping his right elbow. Patient reported having mild shortness of breath otherwise, asymptomatic. Will forward today's vital signs to onsite provider Jari Favre [facility] for review.  Onsite provider Jari Favre reviewed today's ECG tracings. No new order's received. Will forward today's ECG tracings to Dr Jimmey Ralph for review.Thayer Headings RN BSN

## 2023-10-21 NOTE — Progress Notes (Signed)
 Cardiac Individual Treatment Plan  Patient Details  Name: Chad [name] MRN: [id number] Date of Birth: [birth year] Referring Provider:   Flowsheet Row INTENSIVE CARDIAC REHAB ORIENT from 10/21/2023 in [facility]  Referring Provider Dr. Charlton Haws, MD       Initial Encounter Date:  Flowsheet Row INTENSIVE CARDIAC REHAB ORIENT from 10/21/2023 in [facility]  Date 10/21/23       Visit Diagnosis: S/P CABG x 2 at [facility]  S/P AVR (aortic valve replacement) at [facility]  Patient's Home Medications on Admission:  Current Outpatient Medications:    amLODipine (NORVASC) 5 MG tablet, Take 1 tablet (5 mg total) by mouth daily., Disp: 30 tablet, Rfl: 1   aspirin 81 MG chewable tablet, Chew 81 mg by mouth daily., Disp: , Rfl:    B-D ULTRAFINE III SHORT PEN 31G X 8 MM MISC, SMARTSIG:1 Each SUB-Q Daily, Disp: , Rfl:    gabapentin (NEURONTIN) 300 MG capsule, TAKE 2 CAPSULES BY MOUTH TWICE A DAY, Disp: 360 capsule, Rfl: 0   insulin glargine (LANTUS) 100 UNIT/ML injection, Inject 14 Units into the skin at bedtime., Disp: , Rfl:    JARDIANCE 10 MG TABS tablet, Take 10 mg by mouth daily., Disp: , Rfl:    levothyroxine (SYNTHROID) 100 MCG tablet, Take 1 tablet (100 mcg total) by mouth daily before breakfast., Disp: 30 tablet, Rfl: 0   metoprolol succinate (TOPROL XL) 25 MG 24 hr tablet, Take 1 tablet (25 mg total) by mouth daily., Disp: 30 tablet, Rfl: 1   ONETOUCH VERIO test strip, , Disp: , Rfl:    polyethylene glycol (MIRALAX / GLYCOLAX) 17 g packet, Take 17 g by mouth as needed for moderate constipation., Disp: , Rfl:    rosuvastatin (CRESTOR) 20 MG tablet, Take 20 mg by mouth daily., Disp: , Rfl:   Past Medical History: Past Medical History:  Diagnosis Date   Cataract    CKD (chronic kidney disease)    Colon polyps    Diabetes (HCC)    Diabetic polyneuropathy associated with type 2  diabetes mellitus (HCC) 01/15/2017   Diabetic retinopathy (HCC)    GERD (gastroesophageal reflux disease)    Hypercholesterolemia    Hypertension    Hypertensive retinopathy    Hypothyroidism    Polyneuropathy    Proteinuria    Severe aortic stenosis     Tobacco Use: Social History   Tobacco Use  Smoking Status Former  Smokeless Tobacco Never    Labs: Review Flowsheet       Latest Ref Rng & Units 05/21/2023  Labs for ITP Cardiac and Pulmonary Rehab  PH, Arterial 7.35 - 7.45 7.316   PCO2 arterial 32 - 48 mmHg 38.9   Bicarbonate 20.0 - 28.0 mmol/L 20.5  21.0  19.9   TCO2 22 - 32 mmol/L 22  22  21    Acid-base deficit 0.0 - 2.0 mmol/L 6.0  6.0  6.0   O2 Saturation % 61  65  91     Details       Multiple values from one day are sorted in reverse-chronological order         Capillary Blood Glucose: Lab Results  Component Value Date   GLUCAP 150 (H) 10/21/2023   GLUCAP 247 (H) 10/01/2023   GLUCAP 225 (H) 10/01/2023   GLUCAP 144 (H) 10/01/2023   GLUCAP 183 (H) 09/30/2023     Exercise Target  Goals: Exercise Program Goal: Individual exercise prescription set using results from initial 6 min walk test and THRR while considering  patient's activity barriers and safety.   Exercise Prescription Goal: Initial exercise prescription builds to 30-45 minutes a day of aerobic activity, 2-3 days per week.  Home exercise guidelines will be given to patient during program as part of exercise prescription that the participant will acknowledge.  Activity Barriers & Risk Stratification:  Activity Barriers & Cardiac Risk Stratification - 10/21/23 1535       Activity Barriers & Cardiac Risk Stratification   Activity Barriers Assistive Device;Deconditioning;Back Problems;Shortness of Breath;Balance Concerns;History of Falls    Cardiac Risk Stratification High             6 Minute Walk:  6 Minute Walk     Row Name 10/21/23 1533         6 Minute Walk   Phase Initial   Nustep test     Distance 1279 feet     Walk Time 6 minutes     # of Rest Breaks 0     MPH 2.42     METS 2.09     RPE 9     Perceived Dyspnea  1     VO2 Peak 7.31     Symptoms Yes (comment)     Comments No symptoms: 0.39km, 454 total steps, 75Avg spm, 1.6MET, 16 AVG Watts     Resting HR 51 bpm     Resting BP 118/62     Resting Oxygen Saturation  95 %     Exercise Oxygen Saturation  during 6 min walk 98 %     Max Ex. HR 53 bpm     Max Ex. BP 138/72     2 Minute Post BP 130/60              Oxygen Initial Assessment:   Oxygen Re-Evaluation:   Oxygen Discharge (Final Oxygen Re-Evaluation):   Initial Exercise Prescription:  Initial Exercise Prescription - 10/21/23 1300       Date of Initial Exercise RX and Referring Provider   Date 10/21/23    Referring Provider Dr. Charlton Haws, MD    Expected Discharge Date 01/12/24      NuStep   Level 1    SPM 60    Minutes 20    METs 1.5      Prescription Details   Frequency (times per week) 3    Duration Progress to 30 minutes of continuous aerobic without signs/symptoms of physical distress      Intensity   THRR 40-80% of Max Heartrate 58-115    Ratings of Perceived Exertion 11-13    Perceived Dyspnea 0-4      Progression   Progression Continue progressive overload as per policy without signs/symptoms or physical distress.      Resistance Training   Training Prescription Yes    Weight 3    Reps 10-15             Perform Capillary Blood Glucose checks as needed.  Exercise Prescription Changes:   Exercise Comments:   Exercise Goals and Review:   Exercise Goals     Row Name 10/21/23 1316             Exercise Goals   Increase Physical Activity Yes       Intervention Provide advice, education, support and counseling about physical activity/exercise needs.;Develop an individualized exercise prescription for aerobic and resistive training based on initial evaluation  findings, risk stratification,  comorbidities and participant's personal goals.       Expected Outcomes Short Term: Attend rehab on a regular basis to increase amount of physical activity.;Long Term: Exercising regularly at least 3-5 days a week.;Long Term: Add in home exercise to make exercise part of routine and to increase amount of physical activity.       Increase Strength and Stamina Yes       Intervention Provide advice, education, support and counseling about physical activity/exercise needs.;Develop an individualized exercise prescription for aerobic and resistive training based on initial evaluation findings, risk stratification, comorbidities and participant's personal goals.       Expected Outcomes Short Term: Increase workloads from initial exercise prescription for resistance, speed, and METs.;Short Term: Perform resistance training exercises routinely during rehab and add in resistance training at home;Long Term: Improve cardiorespiratory fitness, muscular endurance and strength as measured by increased METs and functional capacity ( )       Able to understand and use rate of perceived exertion (RPE) scale Yes       Intervention Provide education and explanation on how to use RPE scale       Expected Outcomes Short Term: Able to use RPE daily in rehab to express subjective intensity level;Long Term:  Able to use RPE to guide intensity level when exercising independently       Knowledge and understanding of Target Heart Rate Range (THRR) Yes       Intervention Provide education and explanation of THRR including how the numbers were predicted and where they are located for reference       Expected Outcomes Short Term: Able to state/look up THRR;Long Term: Able to use THRR to govern intensity when exercising independently;Short Term: Able to use daily as guideline for intensity in rehab       Understanding of Exercise Prescription Yes       Intervention Provide education, explanation, and written materials on patient's  individual exercise prescription       Expected Outcomes Short Term: Able to explain program exercise prescription;Long Term: Able to explain home exercise prescription to exercise independently                Exercise Goals Re-Evaluation :   Discharge Exercise Prescription (Final Exercise Prescription Changes):   Nutrition:  Target Goals: Understanding of nutrition guidelines, daily intake of sodium 1500mg , cholesterol 200mg , calories 30% from fat and 7% or less from saturated fats, daily to have 5 or more servings of fruits and vegetables.  Biometrics:  Pre Biometrics - 10/21/23 1309       Pre Biometrics   Waist Circumference 40 inches    Hip Circumference 43 inches    Waist to Hip Ratio 0.93 %    Triceps Skinfold 7 mm    % Body Fat 24.9 %    Grip Strength 26 kg    Flexibility --   unable to reach   Single Leg Stand --   Assist device. No attempt             Nutrition Therapy Plan and Nutrition Goals:   Nutrition Assessments:  MEDIFICTS Score Key: >=70 Need to make dietary changes  40-70 Heart Healthy Diet <= 40 Therapeutic Level Cholesterol Diet    Picture Your Plate Scores: <16 Unhealthy dietary pattern with much room for improvement. 41-50 Dietary pattern unlikely to meet recommendations for good health and room for improvement. 51-60 More healthful dietary pattern, with some room for improvement.  >60 Healthy  dietary pattern, although there may be some specific behaviors that could be improved.    Nutrition Goals Re-Evaluation:   Nutrition Goals Re-Evaluation:   Nutrition Goals Discharge (Final Nutrition Goals Re-Evaluation):   Psychosocial: Target Goals: Acknowledge presence or absence of significant depression and/or stress, maximize coping skills, provide positive support system. Participant is able to verbalize types and ability to use techniques and skills needed for reducing stress and depression.  Initial Review & Psychosocial  Screening:  Initial Psych Review & Screening - 10/21/23 1356       Initial Review   Current issues with None Identified      Family Dynamics   Good Support System? Yes   Aodhan has his wife two children and grandchildren for support     Barriers   Psychosocial barriers to participate in program There are no identifiable barriers or psychosocial needs.      Screening Interventions   Interventions Encouraged to exercise             Quality of Life Scores:  Quality of Life - 10/21/23 1538       Quality of Life   Select Quality of Life      Quality of Life Scores   Health/Function Pre 26.8 %    Socioeconomic Pre 28.33 %    Psych/Spiritual Pre 26.93 %    Family Pre 28.8 %    GLOBAL Pre 27.41 %            Scores of 19 and below usually indicate a poorer quality of life in these areas.  A difference of  2-3 points is a clinically meaningful difference.  A difference of 2-3 points in the total score of the Quality of Life Index has been associated with significant improvement in overall quality of life, self-image, physical symptoms, and general health in studies assessing change in quality of life.  PHQ-9: Review Flowsheet       10/21/2023  Depression screen PHQ 2/9  Decreased Interest 0  Down, Depressed, Hopeless 0  PHQ - 2 Score 0  Altered sleeping 0  Tired, decreased energy 0  Change in appetite 0  Feeling bad or failure about yourself  0  Trouble concentrating 0  Moving slowly or fidgety/restless 0  Suicidal thoughts 0  PHQ-9 Score 0   Interpretation of Total Score  Total Score Depression Severity:  1-4 = Minimal depression, 5-9 = Mild depression, 10-14 = Moderate depression, 15-19 = Moderately severe depression, 20-27 = Severe depression   Psychosocial Evaluation and Intervention:   Psychosocial Re-Evaluation:   Psychosocial Discharge (Final Psychosocial Re-Evaluation):   Vocational Rehabilitation: Provide vocational rehab assistance to  qualifying candidates.   Vocational Rehab Evaluation & Intervention:  Vocational Rehab - 10/21/23 1357       Initial Vocational Rehab Evaluation & Intervention   Assessment shows need for Vocational Rehabilitation No   Vale is retired and does not need vocational rehab at this time            Education: Education Goals: Education classes will be provided on a weekly basis, covering required topics. Participant will state understanding/return demonstration of topics presented.     Core Videos: Exercise    Move It!  Clinical staff conducted group or individual video education with verbal and written material and guidebook.  Patient learns the recommended Pritikin exercise program. Exercise with the goal of living a long, healthy life. Some of the health benefits of exercise include controlled diabetes, healthier blood pressure levels, improved  cholesterol levels, improved heart and lung capacity, improved sleep, and better body composition. Everyone should speak with their doctor before starting or changing an exercise routine.  Biomechanical Limitations Clinical staff conducted group or individual video education with verbal and written material and guidebook.  Patient learns how biomechanical limitations can impact exercise and how we can mitigate and possibly overcome limitations to have an impactful and balanced exercise routine.  Body Composition Clinical staff conducted group or individual video education with verbal and written material and guidebook.  Patient learns that body composition (ratio of muscle mass to fat mass) is a key component to assessing overall fitness, rather than body weight alone. Increased fat mass, especially visceral belly fat, can put [place] at increased risk for metabolic syndrome, type 2 diabetes, heart disease, and even death. It is recommended to combine diet and exercise (cardiovascular and resistance training) to improve your body composition. Seek  guidance from your physician and exercise physiologist before implementing an exercise routine.  Exercise Action Plan Clinical staff conducted group or individual video education with verbal and written material and guidebook.  Patient learns the recommended strategies to achieve and enjoy long-term exercise adherence, including variety, self-motivation, self-efficacy, and positive decision making. Benefits of exercise include fitness, good health, weight management, more energy, better sleep, less stress, and overall well-being.  Medical   Heart Disease Risk Reduction Clinical staff conducted group or individual video education with verbal and written material and guidebook.  Patient learns our heart is our most vital organ as it circulates oxygen, nutrients, white blood cells, and hormones throughout the entire body, and carries waste away. Data supports a plant-based eating plan like the Pritikin Program for its effectiveness in slowing progression of and reversing heart disease. The video provides a number of recommendations to address heart disease.   Metabolic Syndrome and Belly Fat  Clinical staff conducted group or individual video education with verbal and written material and guidebook.  Patient learns what metabolic syndrome is, how it leads to heart disease, and how one can reverse it and keep it from coming back. You have metabolic syndrome if you have 3 of the following 5 criteria: abdominal obesity, high blood pressure, high triglycerides, low HDL cholesterol, and high blood sugar.  Hypertension and Heart Disease Clinical staff conducted group or individual video education with verbal and written material and guidebook.  Patient learns that high blood pressure, or hypertension, is very common in the [place]. Hypertension is largely due to excessive salt intake, but other important risk factors include being overweight, physical inactivity, drinking too much alcohol, smoking, and  not eating enough potassium from fruits and vegetables. High blood pressure is a leading risk factor for heart attack, stroke, congestive heart failure, dementia, kidney failure, and premature death. Long-term effects of excessive salt intake include stiffening of the arteries and thickening of heart muscle and organ damage. Recommendations include ways to reduce hypertension and the risk of heart disease.  Diseases of Our Time - Focusing on Diabetes Clinical staff conducted group or individual video education with verbal and written material and guidebook.  Patient learns why the best way to stop diseases of our time is prevention, through food and other lifestyle changes. Medicine (such as prescription pills and surgeries) is often only a Band-Aid on the problem, not a long-term solution. Most common diseases of our time include obesity, type 2 diabetes, hypertension, heart disease, and cancer. The Pritikin Program is recommended and has been proven to help reduce, reverse, and/or  prevent the damaging effects of metabolic syndrome.  Nutrition   Overview of the Pritikin Eating Plan  Clinical staff conducted group or individual video education with verbal and written material and guidebook.  Patient learns about the Pritikin Eating Plan for disease risk reduction. The Pritikin Eating Plan emphasizes a wide variety of unrefined, minimally-processed carbohydrates, like fruits, vegetables, whole grains, and legumes. Go, Caution, and Stop food choices are explained. Plant-based and lean animal proteins are emphasized. Rationale provided for low sodium intake for blood pressure control, low added sugars for blood sugar stabilization, and low added fats and oils for coronary artery disease risk reduction and weight management.  Calorie Density  Clinical staff conducted group or individual video education with verbal and written material and guidebook.  Patient learns about calorie density and how it impacts  the Pritikin Eating Plan. Knowing the characteristics of the food you choose will help you decide whether those foods will lead to weight gain or weight loss, and whether you want to consume more or less of them. Weight loss is usually a side effect of the Pritikin Eating Plan because of its focus on low calorie-dense foods.  Label Reading  Clinical staff conducted group or individual video education with verbal and written material and guidebook.  Patient learns about the Pritikin recommended label reading guidelines and corresponding recommendations regarding calorie density, added sugars, sodium content, and whole grains.  Dining Out - Part 1  Clinical staff conducted group or individual video education with verbal and written material and guidebook.  Patient learns that restaurant meals can be sabotaging because they can be so high in calories, fat, sodium, and/or sugar. Patient learns recommended strategies on how to positively address this and avoid unhealthy pitfalls.  Facts on Fats  Clinical staff conducted group or individual video education with verbal and written material and guidebook.  Patient learns that lifestyle modifications can be just as effective, if not more so, as many medications for lowering your risk of heart disease. A Pritikin lifestyle can help to reduce your risk of inflammation and atherosclerosis (cholesterol build-up, or plaque, in the artery walls). Lifestyle interventions such as dietary choices and physical activity address the cause of atherosclerosis. A review of the types of fats and their impact on blood cholesterol levels, along with dietary recommendations to reduce fat intake is also included.  Nutrition Action Plan  Clinical staff conducted group or individual video education with verbal and written material and guidebook.  Patient learns how to incorporate Pritikin recommendations into their lifestyle. Recommendations include planning and keeping personal  health goals in mind as an important part of their success.  Healthy Mind-Set    Healthy Minds, Bodies, Hearts  Clinical staff conducted group or individual video education with verbal and written material and guidebook.  Patient learns how to identify when they are stressed. Video will discuss the impact of that stress, as well as the many benefits of stress management. Patient will also be introduced to stress management techniques. The way we think, act, and feel has an impact on our hearts.  How Our Thoughts Can Heal Our Hearts  Clinical staff conducted group or individual video education with verbal and written material and guidebook.  Patient learns that negative thoughts can cause depression and anxiety. This can result in negative lifestyle behavior and serious health problems. Cognitive behavioral therapy is an effective method to help control our thoughts in order to change and improve our emotional outlook.  Additional Videos:  Exercise  Improving Performance  Clinical staff conducted group or individual video education with verbal and written material and guidebook.  Patient learns to use a non-linear approach by alternating intensity levels and lengths of time spent exercising to help burn more calories and lose more body fat. Cardiovascular exercise helps improve heart health, metabolism, hormonal balance, blood sugar control, and recovery from fatigue. Resistance training improves strength, endurance, balance, coordination, reaction time, metabolism, and muscle mass. Flexibility exercise improves circulation, posture, and balance. Seek guidance from your physician and exercise physiologist before implementing an exercise routine and learn your capabilities and proper form for all exercise.  Introduction to Yoga  Clinical staff conducted group or individual video education with verbal and written material and guidebook.  Patient learns about yoga, a discipline of the coming  together of mind, breath, and body. The benefits of yoga include improved flexibility, improved range of motion, better posture and core strength, increased lung function, weight loss, and positive self-image. Yoga's heart health benefits include lowered blood pressure, healthier heart rate, decreased cholesterol and triglyceride levels, improved immune function, and reduced stress. Seek guidance from your physician and exercise physiologist before implementing an exercise routine and learn your capabilities and proper form for all exercise.  Medical   Aging: Enhancing Your Quality of Life  Clinical staff conducted group or individual video education with verbal and written material and guidebook.  Patient learns key strategies and recommendations to stay in good physical health and enhance quality of life, such as prevention strategies, having an advocate, securing a Health Care Proxy and Power of Attorney, and keeping a list of medications and system for tracking them. It also discusses how to avoid risk for bone loss.  Biology of Weight Control  Clinical staff conducted group or individual video education with verbal and written material and guidebook.  Patient learns that weight gain occurs because we consume more calories than we burn (eating more, moving less). Even if your body weight is normal, you may have higher ratios of fat compared to muscle mass. Too much body fat puts you at increased risk for cardiovascular disease, heart attack, stroke, type 2 diabetes, and obesity-related cancers. In addition to exercise, following the Pritikin Eating Plan can help reduce your risk.  Decoding Lab Results  Clinical staff conducted group or individual video education with verbal and written material and guidebook.  Patient learns that lab test reflects one measurement whose values change over time and are influenced by many factors, including medication, stress, sleep, exercise, food, hydration,  pre-existing medical conditions, and more. It is recommended to use the knowledge from this video to become more involved with your lab results and evaluate your numbers to speak with your doctor.   Diseases of Our Time - Overview  Clinical staff conducted group or individual video education with verbal and written material and guidebook.  Patient learns that according to the CDC, 50% to 70% of chronic diseases (such as obesity, type 2 diabetes, elevated lipids, hypertension, and heart disease) are avoidable through lifestyle improvements including healthier food choices, listening to satiety cues, and increased physical activity.  Sleep Disorders Clinical staff conducted group or individual video education with verbal and written material and guidebook.  Patient learns how good quality and duration of sleep are important to overall health and well-being. Patient also learns about sleep disorders and how they impact health along with recommendations to address them, including discussing with a physician.  Nutrition  Dining Out - Part 2 Clinical staff conducted  group or individual video education with verbal and written material and guidebook.  Patient learns how to plan ahead and communicate in order to maximize their dining experience in a healthy and nutritious manner. Included are recommended food choices based on the type of restaurant the patient is visiting.   Fueling a Banker conducted group or individual video education with verbal and written material and guidebook.  There is a strong connection between our food choices and our health. Diseases like obesity and type 2 diabetes are very prevalent and are in large-part due to lifestyle choices. The Pritikin Eating Plan provides plenty of food and hunger-curbing satisfaction. It is easy to follow, affordable, and helps reduce health risks.  Menu Workshop  Clinical staff conducted group or individual video education  with verbal and written material and guidebook.  Patient learns that restaurant meals can sabotage health goals because they are often packed with calories, fat, sodium, and sugar. Recommendations include strategies to plan ahead and to communicate with the manager, chef, or server to help order a healthier meal.  Planning Your Eating Strategy  Clinical staff conducted group or individual video education with verbal and written material and guidebook.  Patient learns about the Pritikin Eating Plan and its benefit of reducing the risk of disease. The Pritikin Eating Plan does not focus on calories. Instead, it emphasizes high-quality, nutrient-rich foods. By knowing the characteristics of the foods, we choose, we can determine their calorie density and make informed decisions.  Targeting Your Nutrition Priorities  Clinical staff conducted group or individual video education with verbal and written material and guidebook.  Patient learns that lifestyle habits have a tremendous impact on disease risk and progression. This video provides eating and physical activity recommendations based on your personal health goals, such as reducing LDL cholesterol, losing weight, preventing or controlling type 2 diabetes, and reducing high blood pressure.  Vitamins and Minerals  Clinical staff conducted group or individual video education with verbal and written material and guidebook.  Patient learns different ways to obtain key vitamins and minerals, including through a recommended healthy diet. It is important to discuss all supplements you take with your doctor.   Healthy Mind-Set    Smoking Cessation  Clinical staff conducted group or individual video education with verbal and written material and guidebook.  Patient learns that cigarette smoking and tobacco addiction pose a serious health risk which affects millions of people. Stopping smoking will significantly reduce the risk of heart disease, lung disease,  and many forms of cancer. Recommended strategies for quitting are covered, including working with your doctor to develop a successful plan.  Culinary   Becoming a Set designer conducted group or individual video education with verbal and written material and guidebook.  Patient learns that cooking at home can be healthy, cost-effective, quick, and puts them in control. Keys to cooking healthy recipes will include looking at your recipe, assessing your equipment needs, planning ahead, making it simple, choosing cost-effective seasonal ingredients, and limiting the use of added fats, salts, and sugars.  Cooking - Breakfast and Snacks  Clinical staff conducted group or individual video education with verbal and written material and guidebook.  Patient learns how important breakfast is to satiety and nutrition through the entire day. Recommendations include key foods to eat during breakfast to help stabilize blood sugar levels and to prevent overeating at meals later in the day. Planning ahead is also a key component.  Cooking - Futures trader  and Sides  Clinical staff conducted group or individual video education with verbal and written material and guidebook.  Patient learns eating strategies to improve overall health, including an approach to cook more at home. Recommendations include thinking of animal protein as a side on your plate rather than center stage and focusing instead on lower calorie dense options like vegetables, fruits, whole grains, and plant-based proteins, such as beans. Making sauces in large quantities to freeze for later and leaving the skin on your vegetables are also recommended to maximize your experience.  Cooking - Healthy Salads and Dressing Clinical staff conducted group or individual video education with verbal and written material and guidebook.  Patient learns that vegetables, fruits, whole grains, and legumes are the foundations of the Pritikin Eating Plan.  Recommendations include how to incorporate each of these in flavorful and healthy salads, and how to create homemade salad dressings. Proper handling of ingredients is also covered. Cooking - Soups and [employer] - Soups and Desserts Clinical staff conducted group or individual video education with verbal and written material and guidebook.  Patient learns that Pritikin soups and desserts make for easy, nutritious, and delicious snacks and meal components that are low in sodium, fat, sugar, and calorie density, while high in vitamins, minerals, and filling fiber. Recommendations include simple and healthy ideas for soups and desserts.   Overview     The Pritikin Solution Program Overview Clinical staff conducted group or individual video education with verbal and written material and guidebook.  Patient learns that the results of the Pritikin Program have been documented in more than 100 articles published in peer-reviewed journals, and the benefits include reducing risk factors for (and, in some cases, even reversing) high cholesterol, high blood pressure, type 2 diabetes, obesity, and more! An overview of the three key pillars of the Pritikin Program will be covered: eating well, doing regular exercise, and having a healthy mind-set.  WORKSHOPS  Exercise: Exercise Basics: Building Your Action Plan Clinical staff led group instruction and group discussion with PowerPoint presentation and patient guidebook. To enhance the learning environment the use of posters, models and videos may be added. At the conclusion of this workshop, patients will comprehend the difference between physical activity and exercise, as well as the benefits of incorporating both, into their routine. Patients will understand the FITT (Frequency, Intensity, Time, and Type) principle and how to use it to build an exercise action plan. In addition, safety concerns and other considerations for exercise and cardiac rehab  will be addressed by the presenter. The purpose of this lesson is to promote a comprehensive and effective weekly exercise routine in order to improve patients' overall level of fitness.   Managing Heart Disease: Your Path to a Healthier Heart Clinical staff led group instruction and group discussion with PowerPoint presentation and patient guidebook. To enhance the learning environment the use of posters, models and videos may be added.At the conclusion of this workshop, patients will understand the anatomy and physiology of the heart. Additionally, they will understand how Pritikin's three pillars impact the risk factors, the progression, and the management of heart disease.  The purpose of this lesson is to provide a high-level overview of the heart, heart disease, and how the Pritikin lifestyle positively impacts risk factors.  Exercise Biomechanics Clinical staff led group instruction and group discussion with PowerPoint presentation and patient guidebook. To enhance the learning environment the use of posters, models and videos may be added. Patients will learn how the  structural parts of their bodies function and how these functions impact their daily activities, movement, and exercise. Patients will learn how to promote a neutral spine, learn how to manage pain, and identify ways to improve their physical movement in order to promote healthy living. The purpose of this lesson is to expose patients to common physical limitations that impact physical activity. Participants will learn practical ways to adapt and manage aches and pains, and to minimize their effect on regular exercise. Patients will learn how to maintain good posture while sitting, walking, and lifting.  Balance Training and Fall Prevention  Clinical staff led group instruction and group discussion with PowerPoint presentation and patient guidebook. To enhance the learning environment the use of posters, models and videos  may be added. At the conclusion of this workshop, patients will understand the importance of their sensorimotor skills (vision, proprioception, and the vestibular system) in maintaining their ability to balance as they age. Patients will apply a variety of balancing exercises that are appropriate for their current level of function. Patients will understand the common causes for poor balance, possible solutions to these problems, and ways to modify their physical environment in order to minimize their fall risk. The purpose of this lesson is to teach patients about the importance of maintaining balance as they age and ways to minimize their risk of falling.  WORKSHOPS   Nutrition:  Fueling a Ship broker led group instruction and group discussion with PowerPoint presentation and patient guidebook. To enhance the learning environment the use of posters, models and videos may be added. Patients will review the foundational principles of the Pritikin Eating Plan and understand what constitutes a serving size in each of the food groups. Patients will also learn Pritikin-friendly foods that are better choices when away from home and review make-ahead meal and snack options. Calorie density will be reviewed and applied to three nutrition priorities: weight maintenance, weight loss, and weight gain. The purpose of this lesson is to reinforce (in a group setting) the key concepts around what patients are recommended to eat and how to apply these guidelines when away from home by planning and selecting Pritikin-friendly options. Patients will understand how calorie density may be adjusted for different weight management goals.  Mindful Eating  Clinical staff led group instruction and group discussion with PowerPoint presentation and patient guidebook. To enhance the learning environment the use of posters, models and videos may be added. Patients will briefly review the concepts of the Pritikin  Eating Plan and the importance of low-calorie dense foods. The concept of mindful eating will be introduced as well as the importance of paying attention to internal hunger signals. Triggers for non-hunger eating and techniques for dealing with triggers will be explored. The purpose of this lesson is to provide patients with the opportunity to review the basic principles of the Pritikin Eating Plan, discuss the value of eating mindfully and how to measure internal cues of hunger and fullness using the Hunger Scale. Patients will also discuss reasons for non-hunger eating and learn strategies to use for controlling emotional eating.  Targeting Your Nutrition Priorities Clinical staff led group instruction and group discussion with PowerPoint presentation and patient guidebook. To enhance the learning environment the use of posters, models and videos may be added. Patients will learn how to determine their genetic susceptibility to disease by reviewing their family history. Patients will gain insight into the importance of diet as part of an overall healthy lifestyle in mitigating the  impact of genetics and other environmental insults. The purpose of this lesson is to provide patients with the opportunity to assess their personal nutrition priorities by looking at their family history, their own health history and current risk factors. Patients will also be able to discuss ways of prioritizing and modifying the Pritikin Eating Plan for their highest risk areas  Menu  Clinical staff led group instruction and group discussion with PowerPoint presentation and patient guidebook. To enhance the learning environment the use of posters, models and videos may be added. Using menus brought in from [employer], or printed from [employer], patients will apply the Pritikin dining out guidelines that were presented in the [employer] video. Patients will also be able to practice these guidelines  in a variety of provided scenarios. The purpose of this lesson is to provide patients with the opportunity to practice hands-on learning of the Pritikin Dining Out guidelines with actual menus and practice scenarios.  Label Reading Clinical staff led group instruction and group discussion with PowerPoint presentation and patient guidebook. To enhance the learning environment the use of posters, models and videos may be added. Patients will review and discuss the Pritikin label reading guidelines presented in Pritikin's Label Reading Educational series video. Using fool labels brought in from local grocery stores and markets, patients will apply the label reading guidelines and determine if the packaged food meet the Pritikin guidelines. The purpose of this lesson is to provide patients with the opportunity to review, discuss, and practice hands-on learning of the Pritikin Label Reading guidelines with actual packaged food labels. Cooking School  Pritikin's [employer] are designed to teach patients ways to prepare quick, simple, and affordable recipes at home. The importance of nutrition's role in chronic disease risk reduction is reflected in its emphasis in the overall Pritikin program. By learning how to prepare essential core Pritikin Eating Plan recipes, patients will increase control over what they eat; be able to customize the flavor of foods without the use of added salt, sugar, or fat; and improve the quality of the food they consume. By learning a set of core recipes which are easily assembled, quickly prepared, and affordable, patients are more likely to prepare more healthy foods at home. These workshops focus on convenient breakfasts, simple entres, side dishes, and desserts which can be prepared with minimal effort and are consistent with nutrition recommendations for cardiovascular risk reduction. Cooking [employer] are taught by a Armed forces logistics/support/administrative officer (RD) who has  been trained by the [employer]. The chef or RD has a clear understanding of the importance of minimizing - if not completely eliminating - added fat, sugar, and sodium in recipes. Throughout the series of Cooking School Workshop sessions, patients will learn about healthy ingredients and efficient methods of cooking to build confidence in their capability to prepare    Cooking School weekly topics:  Adding Flavor- Sodium-Free  Fast and Healthy Breakfasts  Powerhouse Plant-Based Proteins  Satisfying Salads and Dressings  Simple Sides and Sauces  International Cuisine-Spotlight on the [employer] Zones  Delicious Desserts  Savory Soups  [employer] - Meals in a Astronomer Appetizers and Snacks  Comforting Weekend Breakfasts  One-Pot Wonders   Fast Evening Meals  Landscape architect Your Pritikin Plate  WORKSHOPS   Healthy Mindset (Psychosocial):  Focused Goals, Sustainable Changes Clinical staff led group instruction and group discussion with PowerPoint presentation and patient guidebook. To enhance the learning environment the  use of posters, models and videos may be added. Patients will be able to apply effective goal setting strategies to establish at least one personal goal, and then take consistent, meaningful action toward that goal. They will learn to identify common barriers to achieving personal goals and develop strategies to overcome them. Patients will also gain an understanding of how our mind-set can impact our ability to achieve goals and the importance of cultivating a positive and growth-oriented mind-set. The purpose of this lesson is to provide patients with a deeper understanding of how to set and achieve personal goals, as well as the tools and strategies needed to overcome common obstacles which may arise along the way.  From Head to Heart: The Power of a Healthy Outlook  Clinical staff led group instruction and group discussion with  PowerPoint presentation and patient guidebook. To enhance the learning environment the use of posters, models and videos may be added. Patients will be able to recognize and describe the impact of emotions and mood on physical health. They will discover the importance of self-care and explore self-care practices which may work for them. Patients will also learn how to utilize the 4 C's to cultivate a healthier outlook and better manage stress and challenges. The purpose of this lesson is to demonstrate to patients how a healthy outlook is an essential part of maintaining good health, especially as they continue their cardiac rehab journey.  Healthy Sleep for a Healthy Heart Clinical staff led group instruction and group discussion with PowerPoint presentation and patient guidebook. To enhance the learning environment the use of posters, models and videos may be added. At the conclusion of this workshop, patients will be able to demonstrate knowledge of the importance of sleep to overall health, well-being, and quality of life. They will understand the symptoms of, and treatments for, common sleep disorders. Patients will also be able to identify daytime and nighttime behaviors which impact sleep, and they will be able to apply these tools to help manage sleep-related challenges. The purpose of this lesson is to provide patients with a general overview of sleep and outline the importance of quality sleep. Patients will learn about a few of the most common sleep disorders. Patients will also be introduced to the concept of "sleep hygiene," and discover ways to self-manage certain sleeping problems through simple daily behavior changes. Finally, the workshop will motivate patients by clarifying the links between quality sleep and their goals of heart-healthy living.   Recognizing and Reducing Stress Clinical staff led group instruction and group discussion with PowerPoint presentation and patient guidebook. To  enhance the learning environment the use of posters, models and videos may be added. At the conclusion of this workshop, patients will be able to understand the types of stress reactions, differentiate between acute and chronic stress, and recognize the impact that chronic stress has on their health. They will also be able to apply different coping mechanisms, such as reframing negative self-talk. Patients will have the opportunity to practice a variety of stress management techniques, such as deep abdominal breathing, progressive muscle relaxation, and/or guided imagery.  The purpose of this lesson is to educate patients on the role of stress in their lives and to provide healthy techniques for coping with it.  Learning Barriers/Preferences:  Learning Barriers/Preferences - 10/21/23 1400       Learning Barriers/Preferences   Learning Barriers Sight;Hearing;Exercise Concerns    Learning Preferences Group Instruction;Individual Instruction;Skilled Demonstration  Education Topics:  Knowledge Questionnaire Score:  Knowledge Questionnaire Score - 10/21/23 1400       Knowledge Questionnaire Score   Pre Score 24/24             Core Components/Risk Factors/Patient Goals at Admission:  Personal Goals and Risk Factors at Admission - 10/21/23 1317       Core Components/Risk Factors/Patient Goals on Admission   Hypertension Yes    Intervention Provide education on lifestyle modifcations including regular physical activity/exercise, weight management, moderate sodium restriction and increased consumption of fresh fruit, vegetables, and low fat dairy, alcohol moderation, and smoking cessation.;Monitor prescription use compliance.    Expected Outcomes Short Term: Continued assessment and intervention until BP is < 140/55mm HG in hypertensive participants. < 130/62mm HG in hypertensive participants with diabetes, heart failure or chronic kidney disease.;Long Term: Maintenance of  blood pressure at goal levels.    Lipids Yes    Intervention Provide education and support for participant on nutrition & aerobic/resistive exercise along with prescribed medications to achieve LDL 70mg , HDL >40mg .    Expected Outcomes Short Term: Participant states understanding of desired cholesterol values and is compliant with medications prescribed. Participant is following exercise prescription and nutrition guidelines.;Long Term: Cholesterol controlled with medications as prescribed, with individualized exercise RX and with personalized nutrition plan. Value goals: LDL < 70mg , HDL > 40 mg.    Personal Goal Other Yes    Personal Goal Pt would like to increase stamina and get into shape and travel to [place] with his family    Intervention Will continue to monitor pt and progress workloads as tolerated without sign or symptom    Expected Outcomes Pt will achieve his goals and gain strength             Core Components/Risk Factors/Patient Goals Review:    Core Components/Risk Factors/Patient Goals at Discharge (Final Review):    ITP Comments:  ITP Comments     Row Name 10/21/23 1310           ITP Comments Dr. Armanda Magic medical director. Introduction to Pritikin education program/intensive cardiac rehab. Initial orientation packet reviewed with patient                Comments: Participant attended orientation for the cardiac rehabilitation program on  10/21/2023  to perform initial intake and exercise nustep test. Patient introduced to the Pritikin Program education and orientation packet was reviewed. Completed 6-minute nustep test, measurements, initial ITP, and exercise prescription. Vital signs stable. Telemetry sinus brady rhythm, only symptom is mild SOB with exertion, resolved when exercise is complete.   Service time was from 1305 to 1520.

## 2023-10-22 ENCOUNTER — Telehealth (HOSPITAL_COMMUNITY): Payer: Self-pay | Admitting: *Deleted

## 2023-10-22 NOTE — Telephone Encounter (Signed)
-----   Message from Nobie Putnam sent at 10/21/2023  3:47 PM EDT ----- Rip Harbour. His general cardiologist is Dr. Eden Emms. I think he is the one that requested cardiac rehab. I agree that he should significantly benefit from cardiac rehab. ----- Message ----- From: Cammy Copa, RN Sent: 10/21/2023   3:36 PM EDT To: Nobie Putnam, MD  Good afternoon Dr Jimmey Ralph, Mr [facility] attended cardiac rehab orientation this afternoon. Resting heart rate and exertional heart rate noted in the low 50's and upper 40's as the  patient has a history of Sinus [place] Foley and is previously documented. Patient completed 6 minute nustep test as he reported falling over this morning picking up something and scraping his right elbow. Patient reported having mild shortness of breath otherwise, asymptomatic. Please see attached ECG tracings and let us know if you have any concerns or further recommendations.  Thank you Sincerely, Gladstone Lighter RN Cardiac Rehab

## 2023-10-25 ENCOUNTER — Telehealth: Payer: Self-pay

## 2023-10-25 DIAGNOSIS — E039 Hypothyroidism, unspecified: Secondary | ICD-10-CM | POA: Diagnosis not present

## 2023-10-25 MED ORDER — METOPROLOL SUCCINATE ER 25 MG PO TB24: 12.5000 mg | ORAL_TABLET | Freq: Every day | ORAL | 1 refills | Status: DC

## 2023-10-25 NOTE — Telephone Encounter (Signed)
-----   Message from Charlton Haws sent at 10/25/2023  4:26 PM EDT ----- Regarding: RE: Sinus brady at cardiac rehab Pam have him cut his lopressor back to 1/2 tab 12.5 mg once/day ----- Message ----- From: Cammy Copa, RN Sent: 10/25/2023   3:32 PM EDT To: Wendall Stade, MD Subject: Sinus brady at cardiac rehab                   Good afternoon Dr Eden Emms Mr [facility] was noted to be bradycardiac at cardiac rehab orientation. He will begin exercise on Wednesday.   I am bring this to your attention   Thank you [facility] ----- Message ----- From: Nobie Putnam, MD Sent: 10/21/2023   3:48 PM EDT To: Cammy Copa, RN  Ok. His general cardiologist is Dr. Eden Emms. I think he is the one that requested cardiac rehab. I agree that he should significantly benefit from cardiac rehab. ----- Message ----- From: Cammy Copa, RN Sent: 10/21/2023   3:36 PM EDT To: Nobie Putnam, MD  Good afternoon Dr Jimmey Ralph, Mr [facility] attended cardiac rehab orientation this afternoon. Resting heart rate and exertional heart rate noted in the low 50's and upper 40's as the  patient has a history of Sinus [place] Foley and is previously documented. Patient completed 6 minute nustep test as he reported falling over this morning picking up something and scraping his right elbow. Patient reported having mild shortness of breath otherwise, asymptomatic. Please see attached ECG tracings and let us know if you have any concerns or further recommendations.  Thank you Sincerely, Gladstone Lighter RN Cardiac Rehab

## 2023-10-25 NOTE — Telephone Encounter (Signed)
 Called patient about Dr. Eden Emms advisement. Patient stated he thinks he is already taking 1/2 a tablet. Encourage patient to take a 1/2 tablet by mouth with dinner and see if this helps.

## 2023-10-26 MED ORDER — FUROSEMIDE 20 MG PO TABS: 60.0000 mg | ORAL_TABLET | Freq: Every day | ORAL | 3 refills | Status: DC

## 2023-10-26 NOTE — Telephone Encounter (Signed)
 Placed referral to Cardiothoracic Surgery and placed order for chest xray for 1 week from start of his lasix.

## 2023-10-26 NOTE — Telephone Encounter (Signed)
 Called patient to let him know Dr. Eden Emms wants him to start taking Lasix 60 mg daily and get BMET in 3 weeks. Patient agreed to plan. Sent in medication and placed order for BMET.

## 2023-10-27 ENCOUNTER — Other Ambulatory Visit (HOSPITAL_COMMUNITY): Payer: PPO

## 2023-10-27 ENCOUNTER — Encounter (HOSPITAL_COMMUNITY)
Admission: RE | Admit: 2023-10-27 | Discharge: 2023-10-27 | Disposition: A | Discharge status: Home / Self Care | Source: Ambulatory Visit | Attending: Cardiovascular Disease | Admitting: Cardiovascular Disease

## 2023-10-27 ENCOUNTER — Other Ambulatory Visit: Payer: Self-pay

## 2023-10-27 DIAGNOSIS — Z951 Presence of aortocoronary bypass graft: Secondary | ICD-10-CM | POA: Diagnosis not present

## 2023-10-27 DIAGNOSIS — J90 Pleural effusion, not elsewhere classified: Secondary | ICD-10-CM

## 2023-10-27 DIAGNOSIS — Z952 Presence of prosthetic heart valve: Secondary | ICD-10-CM | POA: Insufficient documentation

## 2023-10-27 LAB — GLUCOSE, CAPILLARY
Glucose-Capillary: 120 mg/dL — ABNORMAL HIGH (ref 70–99)
Glucose-Capillary: 105 mg/dL — ABNORMAL HIGH (ref 70–99)

## 2023-10-27 NOTE — Progress Notes (Signed)
 Cardiac Individual Treatment Plan  Patient Details  Name: Chad [name] MRN: [id number] Date of Birth: Oct 30, 1945 Referring Provider:   Flowsheet Row INTENSIVE CARDIAC REHAB ORIENT from 10/21/2023 in [facility] Heart, V[facility], & Lung [facility]  Referring Provider Dr. Charlton Haws, MD       Initial Encounter Date:  Flowsheet Row INTENSIVE CARDIAC REHAB ORIENT from 10/21/2023 in [facility] for Heart, Vascular, & Lung Health  Date 10/21/23       Visit Diagnosis: S/P CABG x 2 at [facility]  S/P AVR (aortic valve replacement) at [facility]  Patient's Home Medications on Admission:  Current Outpatient Medications:    amLODipine (NORVASC) 5 MG tablet, Take 1 tablet (5 mg total) by mouth daily., Disp: 30 tablet, Rfl: 1   aspirin 81 MG chewable tablet, Chew 81 mg by mouth daily., Disp: , Rfl:    B-D ULTRAFINE III SHORT PEN 31G X 8 MM MISC, SMARTSIG:1 Each SUB-Q Daily, Disp: , Rfl:    gabapentin (NEURONTIN) 300 MG capsule, TAKE 2 CAPSULES BY MOUTH TWICE A DAY, Disp: 360 capsule, Rfl: 0   insulin glargine (LANTUS) 100 UNIT/ML injection, Inject 14 Units into the skin at bedtime., Disp: , Rfl:    JARDIANCE 10 MG TABS tablet, Take 10 mg by mouth daily., Disp: , Rfl:    levothyroxine (SYNTHROID) 100 MCG tablet, Take 1 tablet (100 mcg total) by mouth daily before breakfast., Disp: 30 tablet, Rfl: 0   ONETOUCH VERIO test strip, , Disp: , Rfl:    polyethylene glycol (MIRALAX / GLYCOLAX) 17 g packet, Take 17 g by mouth as needed for moderate constipation., Disp: , Rfl:    rosuvastatin (CRESTOR) 20 MG tablet, Take 20 mg by mouth daily., Disp: , Rfl:    furosemide (LASIX) 20 MG tablet, Take 3 tablets (60 mg total) by mouth daily., Disp: 90 tablet, Rfl: 3   metoprolol succinate (TOPROL XL) 25 MG 24 hr tablet, Take 0.5 tablets (12.5 mg total) by mouth daily., Disp: 45 tablet, Rfl: 1  Past Medical History: Past Medical History:  Diagnosis Date   Cataract    CKD  (chronic kidney disease)    Colon polyps    Diabetes (HCC)    Diabetic polyneuropathy associated with type 2 diabetes mellitus (HCC) 01/15/2017   Diabetic retinopathy (HCC)    GERD (gastroesophageal reflux disease)    Hypercholesterolemia    Hypertension    Hypertensive retinopathy    Hypothyroidism    Polyneuropathy    Proteinuria    Severe aortic stenosis     Tobacco Use: Social History   Tobacco Use  Smoking Status Former  Smokeless Tobacco Never    Labs: Review Flowsheet       Latest Ref Rng & Units 05/21/2023  Labs for ITP Cardiac and Pulmonary Rehab  PH, Arterial 7.35 - 7.45 7.316   PCO2 arterial 32 - 48 mmHg 38.9   Bicarbonate 20.0 - 28.0 mmol/L 20.5  21.0  19.9   TCO2 22 - 32 mmol/L 22  22  21    Acid-base deficit 0.0 - 2.0 mmol/L 6.0  6.0  6.0   O2 Saturation % 61  65  91     Details       Multiple values from one day are sorted in reverse-chronological order         Capillary Blood Glucose: Lab Results  Component Value Date   GLUCAP 150 (H) 10/21/2023   GLUCAP 247 (H) 10/01/2023   GLUCAP  225 (H) 10/01/2023   GLUCAP 144 (H) 10/01/2023   GLUCAP 183 (H) 09/30/2023     Exercise Target Goals: Exercise Program Goal: Individual exercise prescription set using results from initial 6 min walk test and THRR while considering  patient's activity barriers and safety.   Exercise Prescription Goal: Initial exercise prescription builds to 30-45 minutes a day of aerobic activity, 2-3 days per week.  Home exercise guidelines will be given to patient during program as part of exercise prescription that the participant will acknowledge.  Activity Barriers & Risk Stratification:  Activity Barriers & Cardiac Risk Stratification - 10/21/23 1535       Activity Barriers & Cardiac Risk Stratification   Activity Barriers Assistive Device;Deconditioning;Back Problems;Shortness of Breath;Balance Concerns;History of Falls    Cardiac Risk Stratification High              6 Minute Walk:  6 Minute Walk     Row Name 10/21/23 1533         6 Minute Walk   Phase Initial  Nustep test     Distance 1279 feet     Walk Time 6 minutes     # of Rest Breaks 0     MPH 2.42     METS 2.09     RPE 9     Perceived Dyspnea  1     VO2 Peak 7.31     Symptoms Yes (comment)     Comments No symptoms: 0.39km, 454 total steps, 75Avg spm, 1.6MET, 16 AVG Watts     Resting HR 51 bpm     Resting BP 118/62     Resting Oxygen Saturation  95 %     Exercise Oxygen Saturation  during 6 min walk 98 %     Max Ex. HR 53 bpm     Max Ex. BP 138/72     2 Minute Post BP 130/60              Oxygen Initial Assessment:   Oxygen Re-Evaluation:   Oxygen Discharge (Final Oxygen Re-Evaluation):   Initial Exercise Prescription:  Initial Exercise Prescription - 10/21/23 1300       Date of Initial Exercise RX and Referring Provider   Date 10/21/23    Referring Provider Dr. Charlton Haws, MD    Expected Discharge Date 01/12/24      NuStep   Level 1    SPM 60    Minutes 20    METs 1.5      Prescription Details   Frequency (times per week) 3    Duration Progress to 30 minutes of continuous aerobic without signs/symptoms of physical distress      Intensity   THRR 40-80% of Max Heartrate 58-115    Ratings of Perceived Exertion 11-13    Perceived Dyspnea 0-4      Progression   Progression Continue progressive overload as per policy without signs/symptoms or physical distress.      Resistance Training   Training Prescription Yes    Weight 3    Reps 10-15             Perform Capillary Blood Glucose checks as needed.  Exercise Prescription Changes:   Exercise Comments:   Exercise Goals and Review:   Exercise Goals     Row Name 10/21/23 1316             Exercise Goals   Increase Physical Activity Yes       Intervention Provide advice,  education, support and counseling about physical activity/exercise needs.;Develop an individualized  exercise prescription for aerobic and resistive training based on initial evaluation findings, risk stratification, comorbidities and participant's personal goals.       Expected Outcomes Short Term: Attend rehab on a regular basis to increase amount of physical activity.;Long Term: Exercising regularly at least 3-5 days a week.;Long Term: Add in home exercise to make exercise part of routine and to increase amount of physical activity.       Increase Strength and Stamina Yes       Intervention Provide advice, education, support and counseling about physical activity/exercise needs.;Develop an individualized exercise prescription for aerobic and resistive training based on initial evaluation findings, risk stratification, comorbidities and participant's personal goals.       Expected Outcomes Short Term: Increase workloads from initial exercise prescription for resistance, speed, and METs.;Short Term: Perform resistance training exercises routinely during rehab and add in resistance training at home;Long Term: Improve cardiorespiratory fitness, muscular endurance and strength as measured by increased METs and functional capacity ( )       Able to understand and use rate of perceived exertion (RPE) scale Yes       Intervention Provide education and explanation on how to use RPE scale       Expected Outcomes Short Term: Able to use RPE daily in rehab to express subjective intensity level;Long Term:  Able to use RPE to guide intensity level when exercising independently       Knowledge and understanding of Target Heart Rate Range (THRR) Yes       Intervention Provide education and explanation of THRR including how the numbers were predicted and where they are located for reference       Expected Outcomes Short Term: Able to state/look up THRR;Long Term: Able to use THRR to govern intensity when exercising independently;Short Term: Able to use daily as guideline for intensity in rehab       Understanding  of Exercise Prescription Yes       Intervention Provide education, explanation, and written materials on patient's individual exercise prescription       Expected Outcomes Short Term: Able to explain program exercise prescription;Long Term: Able to explain home exercise prescription to exercise independently                Exercise Goals Re-Evaluation :   Discharge Exercise Prescription (Final Exercise Prescription Changes):   Nutrition:  Target Goals: Understanding of nutrition guidelines, daily intake of sodium 1500mg , cholesterol 200mg , calories 30% from fat and 7% or less from saturated fats, daily to have 5 or more servings of fruits and vegetables.  Biometrics:  Pre Biometrics - 10/21/23 1309       Pre Biometrics   Waist Circumference 40 inches    Hip Circumference 43 inches    Waist to Hip Ratio 0.93 %    Triceps Skinfold 7 mm    % Body Fat 24.9 %    Grip Strength 26 kg    Flexibility --   unable to reach   Single Leg Stand --   Assist device. No attempt             Nutrition Therapy Plan and Nutrition Goals:   Nutrition Assessments:  MEDIFICTS Score Key: >=70 Need to make dietary changes  40-70 Heart Healthy Diet <= 40 Therapeutic Level Cholesterol Diet    Picture Your Plate Scores: <16 Unhealthy dietary pattern with much room for improvement. 41-50 Dietary pattern unlikely to meet  recommendations for good health and room for improvement. 51-60 More healthful dietary pattern, with some room for improvement.  >60 Healthy dietary pattern, although there may be some specific behaviors that could be improved.    Nutrition Goals Re-Evaluation:   Nutrition Goals Re-Evaluation:   Nutrition Goals Discharge (Final Nutrition Goals Re-Evaluation):   Psychosocial: Target Goals: Acknowledge presence or absence of significant depression and/or stress, maximize coping skills, provide positive support system. Participant is able to verbalize types and  ability to use techniques and skills needed for reducing stress and depression.  Initial Review & Psychosocial Screening:  Initial Psych Review & Screening - 10/21/23 1356       Initial Review   Current issues with None Identified      Family Dynamics   Good Support System? Yes   Kaesen has his wife two children and grandchildren for support     Barriers   Psychosocial barriers to participate in program There are no identifiable barriers or psychosocial needs.      Screening Interventions   Interventions Encouraged to exercise             Quality of Life Scores:  Quality of Life - 10/21/23 1538       Quality of Life   Select Quality of Life      Quality of Life Scores   Health/Function Pre 26.8 %    Socioeconomic Pre 28.33 %    Psych/Spiritual Pre 26.93 %    Family Pre 28.8 %    GLOBAL Pre 27.41 %            Scores of 19 and below usually indicate a poorer quality of life in these areas.  A difference of  2-3 points is a clinically meaningful difference.  A difference of 2-3 points in the total score of the Quality of Life Index has been associated with significant improvement in overall quality of life, self-image, physical symptoms, and general health in studies assessing change in quality of life.  PHQ-9: Review Flowsheet       10/21/2023  Depression screen PHQ 2/9  Decreased Interest 0  Down, Depressed, Hopeless 0  PHQ - 2 Score 0  Altered sleeping 0  Tired, decreased energy 0  Change in appetite 0  Feeling bad or failure about yourself  0  Trouble concentrating 0  Moving slowly or fidgety/restless 0  Suicidal thoughts 0  PHQ-9 Score 0   Interpretation of Total Score  Total Score Depression Severity:  1-4 = Minimal depression, 5-9 = Mild depression, 10-14 = Moderate depression, 15-19 = Moderately severe depression, 20-27 = Severe depression   Psychosocial Evaluation and Intervention:   Psychosocial Re-Evaluation:   Psychosocial Discharge  (Final Psychosocial Re-Evaluation):   Vocational Rehabilitation: Provide vocational rehab assistance to qualifying candidates.   Vocational Rehab Evaluation & Intervention:  Vocational Rehab - 10/21/23 1357       Initial Vocational Rehab Evaluation & Intervention   Assessment shows need for Vocational Rehabilitation No   Marke is retired and does not need vocational rehab at this time            Education: Education Goals: Education classes will be provided on a weekly basis, covering required topics. Participant will state understanding/return demonstration of topics presented.     Core Videos: Exercise    Move It!  Clinical staff conducted group or individual video education with verbal and written material and guidebook.  Patient learns the recommended Pritikin exercise program. Exercise with the goal  of living a long, healthy life. Some of the health benefits of exercise include controlled diabetes, healthier blood pressure levels, improved cholesterol levels, improved heart and lung capacity, improved sleep, and better body composition. Everyone should speak with their doctor before starting or changing an exercise routine.  Biomechanical Limitations Clinical staff conducted group or individual video education with verbal and written material and guidebook.  Patient learns how biomechanical limitations can impact exercise and how we can mitigate and possibly overcome limitations to have an impactful and balanced exercise routine.  Body Composition Clinical staff conducted group or individual video education with verbal and written material and guidebook.  Patient learns that body composition (ratio of muscle mass to fat mass) is a key component to assessing overall fitness, rather than body weight alone. Increased fat mass, especially visceral belly fat, can put [place] at increased risk for metabolic syndrome, type 2 diabetes, heart disease, and even death. It is recommended to  combine diet and exercise (cardiovascular and resistance training) to improve your body composition. Seek guidance from your physician and exercise physiologist before implementing an exercise routine.  Exercise Action Plan Clinical staff conducted group or individual video education with verbal and written material and guidebook.  Patient learns the recommended strategies to achieve and enjoy long-term exercise adherence, including variety, self-motivation, self-efficacy, and positive decision making. Benefits of exercise include fitness, good health, weight management, more energy, better sleep, less stress, and overall well-being.  Medical   Heart Disease Risk Reduction Clinical staff conducted group or individual video education with verbal and written material and guidebook.  Patient learns our heart is our most vital organ as it circulates oxygen, nutrients, white blood cells, and hormones throughout the entire body, and carries waste away. Data supports a plant-based eating plan like the Pritikin Program for its effectiveness in slowing progression of and reversing heart disease. The video provides a number of recommendations to address heart disease.   Metabolic Syndrome and Belly Fat  Clinical staff conducted group or individual video education with verbal and written material and guidebook.  Patient learns what metabolic syndrome is, how it leads to heart disease, and how one can reverse it and keep it from coming back. You have metabolic syndrome if you have 3 of the following 5 criteria: abdominal obesity, high blood pressure, high triglycerides, low HDL cholesterol, and high blood sugar.  Hypertension and Heart Disease Clinical staff conducted group or individual video education with verbal and written material and guidebook.  Patient learns that high blood pressure, or hypertension, is very common in the [place]. Hypertension is largely due to excessive salt intake, but other  important risk factors include being overweight, physical inactivity, drinking too much alcohol, smoking, and not eating enough potassium from fruits and vegetables. High blood pressure is a leading risk factor for heart attack, stroke, congestive heart failure, dementia, kidney failure, and premature death. Long-term effects of excessive salt intake include stiffening of the arteries and thickening of heart muscle and organ damage. Recommendations include ways to reduce hypertension and the risk of heart disease.  Diseases of Our Time - Focusing on Diabetes Clinical staff conducted group or individual video education with verbal and written material and guidebook.  Patient learns why the best way to stop diseases of our time is prevention, through food and other lifestyle changes. Medicine (such as prescription pills and surgeries) is often only a Band-Aid on the problem, not a long-term solution. Most common diseases of our time include obesity, type  2 diabetes, hypertension, heart disease, and cancer. The Pritikin Program is recommended and has been proven to help reduce, reverse, and/or prevent the damaging effects of metabolic syndrome.  Nutrition   Overview of the Pritikin Eating Plan  Clinical staff conducted group or individual video education with verbal and written material and guidebook.  Patient learns about the Pritikin Eating Plan for disease risk reduction. The Pritikin Eating Plan emphasizes a wide variety of unrefined, minimally-processed carbohydrates, like fruits, vegetables, whole grains, and legumes. Go, Caution, and Stop food choices are explained. Plant-based and lean animal proteins are emphasized. Rationale provided for low sodium intake for blood pressure control, low added sugars for blood sugar stabilization, and low added fats and oils for coronary artery disease risk reduction and weight management.  Calorie Density  Clinical staff conducted group or individual video  education with verbal and written material and guidebook.  Patient learns about calorie density and how it impacts the Pritikin Eating Plan. Knowing the characteristics of the food you choose will help you decide whether those foods will lead to weight gain or weight loss, and whether you want to consume more or less of them. Weight loss is usually a side effect of the Pritikin Eating Plan because of its focus on low calorie-dense foods.  Label Reading  Clinical staff conducted group or individual video education with verbal and written material and guidebook.  Patient learns about the Pritikin recommended label reading guidelines and corresponding recommendations regarding calorie density, added sugars, sodium content, and whole grains.  Dining Out - Part 1  Clinical staff conducted group or individual video education with verbal and written material and guidebook.  Patient learns that restaurant meals can be sabotaging because they can be so high in calories, fat, sodium, and/or sugar. Patient learns recommended strategies on how to positively address this and avoid unhealthy pitfalls.  Facts on Fats  Clinical staff conducted group or individual video education with verbal and written material and guidebook.  Patient learns that lifestyle modifications can be just as effective, if not more so, as many medications for lowering your risk of heart disease. A Pritikin lifestyle can help to reduce your risk of inflammation and atherosclerosis (cholesterol build-up, or plaque, in the artery walls). Lifestyle interventions such as dietary choices and physical activity address the cause of atherosclerosis. A review of the types of fats and their impact on blood cholesterol levels, along with dietary recommendations to reduce fat intake is also included.  Nutrition Action Plan  Clinical staff conducted group or individual video education with verbal and written material and guidebook.  Patient learns how  to incorporate Pritikin recommendations into their lifestyle. Recommendations include planning and keeping personal health goals in mind as an important part of their success.  Healthy Mind-Set    Healthy Minds, Bodies, Hearts  Clinical staff conducted group or individual video education with verbal and written material and guidebook.  Patient learns how to identify when they are stressed. Video will discuss the impact of that stress, as well as the many benefits of stress management. Patient will also be introduced to stress management techniques. The way we think, act, and feel has an impact on our hearts.  How Our Thoughts Can Heal Our Hearts  Clinical staff conducted group or individual video education with verbal and written material and guidebook.  Patient learns that negative thoughts can cause depression and anxiety. This can result in negative lifestyle behavior and serious health problems. Cognitive behavioral therapy is an effective  method to help control our thoughts in order to change and improve our emotional outlook.  Additional Videos:  Exercise    Improving Performance  Clinical staff conducted group or individual video education with verbal and written material and guidebook.  Patient learns to use a non-linear approach by alternating intensity levels and lengths of time spent exercising to help burn more calories and lose more body fat. Cardiovascular exercise helps improve heart health, metabolism, hormonal balance, blood sugar control, and recovery from fatigue. Resistance training improves strength, endurance, balance, coordination, reaction time, metabolism, and muscle mass. Flexibility exercise improves circulation, posture, and balance. Seek guidance from your physician and exercise physiologist before implementing an exercise routine and learn your capabilities and proper form for all exercise.  Introduction to Yoga  Clinical staff conducted group or individual video  education with verbal and written material and guidebook.  Patient learns about yoga, a discipline of the coming together of mind, breath, and body. The benefits of yoga include improved flexibility, improved range of motion, better posture and core strength, increased lung function, weight loss, and positive self-image. Yoga's heart health benefits include lowered blood pressure, healthier heart rate, decreased cholesterol and triglyceride levels, improved immune function, and reduced stress. Seek guidance from your physician and exercise physiologist before implementing an exercise routine and learn your capabilities and proper form for all exercise.  Medical   Aging: Enhancing Your Quality of Life  Clinical staff conducted group or individual video education with verbal and written material and guidebook.  Patient learns key strategies and recommendations to stay in good physical health and enhance quality of life, such as prevention strategies, having an advocate, securing a Health Care Proxy and Power of Attorney, and keeping a list of medications and system for tracking them. It also discusses how to avoid risk for bone loss.  Biology of Weight Control  Clinical staff conducted group or individual video education with verbal and written material and guidebook.  Patient learns that weight gain occurs because we consume more calories than we burn (eating more, moving less). Even if your body weight is normal, you may have higher ratios of fat compared to muscle mass. Too much body fat puts you at increased risk for cardiovascular disease, heart attack, stroke, type 2 diabetes, and obesity-related cancers. In addition to exercise, following the Pritikin Eating Plan can help reduce your risk.  Decoding Lab Results  Clinical staff conducted group or individual video education with verbal and written material and guidebook.  Patient learns that lab test reflects one measurement whose values change over  time and are influenced by many factors, including medication, stress, sleep, exercise, food, hydration, pre-existing medical conditions, and more. It is recommended to use the knowledge from this video to become more involved with your lab results and evaluate your numbers to speak with your doctor.   Diseases of Our Time - Overview  Clinical staff conducted group or individual video education with verbal and written material and guidebook.  Patient learns that according to the CDC, 50% to 70% of chronic diseases (such as obesity, type 2 diabetes, elevated lipids, hypertension, and heart disease) are avoidable through lifestyle improvements including healthier food choices, listening to satiety cues, and increased physical activity.  Sleep Disorders Clinical staff conducted group or individual video education with verbal and written material and guidebook.  Patient learns how good quality and duration of sleep are important to overall health and well-being. Patient also learns about sleep disorders and how they impact  health along with recommendations to address them, including discussing with a physician.  Nutrition  Dining Out - Part 2 Clinical staff conducted group or individual video education with verbal and written material and guidebook.  Patient learns how to plan ahead and communicate in order to maximize their dining experience in a healthy and nutritious manner. Included are recommended food choices based on the type of restaurant the patient is visiting.   Fueling a Banker conducted group or individual video education with verbal and written material and guidebook.  There is a strong connection between our food choices and our health. Diseases like obesity and type 2 diabetes are very prevalent and are in large-part due to lifestyle choices. The Pritikin Eating Plan provides plenty of food and hunger-curbing satisfaction. It is easy to follow, affordable, and  helps reduce health risks.  Menu Workshop  Clinical staff conducted group or individual video education with verbal and written material and guidebook.  Patient learns that restaurant meals can sabotage health goals because they are often packed with calories, fat, sodium, and sugar. Recommendations include strategies to plan ahead and to communicate with the manager, chef, or server to help order a healthier meal.  Planning Your Eating Strategy  Clinical staff conducted group or individual video education with verbal and written material and guidebook.  Patient learns about the Pritikin Eating Plan and its benefit of reducing the risk of disease. The Pritikin Eating Plan does not focus on calories. Instead, it emphasizes high-quality, nutrient-rich foods. By knowing the characteristics of the foods, we choose, we can determine their calorie density and make informed decisions.  Targeting Your Nutrition Priorities  Clinical staff conducted group or individual video education with verbal and written material and guidebook.  Patient learns that lifestyle habits have a tremendous impact on disease risk and progression. This video provides eating and physical activity recommendations based on your personal health goals, such as reducing LDL cholesterol, losing weight, preventing or controlling type 2 diabetes, and reducing high blood pressure.  Vitamins and Minerals  Clinical staff conducted group or individual video education with verbal and written material and guidebook.  Patient learns different ways to obtain key vitamins and minerals, including through a recommended healthy diet. It is important to discuss all supplements you take with your doctor.   Healthy Mind-Set    Smoking Cessation  Clinical staff conducted group or individual video education with verbal and written material and guidebook.  Patient learns that cigarette smoking and tobacco addiction pose a serious health risk which  affects millions of people. Stopping smoking will significantly reduce the risk of heart disease, lung disease, and many forms of cancer. Recommended strategies for quitting are covered, including working with your doctor to develop a successful plan.  Culinary   Becoming a Set designer conducted group or individual video education with verbal and written material and guidebook.  Patient learns that cooking at home can be healthy, cost-effective, quick, and puts them in control. Keys to cooking healthy recipes will include looking at your recipe, assessing your equipment needs, planning ahead, making it simple, choosing cost-effective seasonal ingredients, and limiting the use of added fats, salts, and sugars.  Cooking - Breakfast and Snacks  Clinical staff conducted group or individual video education with verbal and written material and guidebook.  Patient learns how important breakfast is to satiety and nutrition through the entire day. Recommendations include key foods to eat during breakfast to help stabilize blood  sugar levels and to prevent overeating at meals later in the day. Planning ahead is also a key component.  Cooking - Educational psychologist conducted group or individual video education with verbal and written material and guidebook.  Patient learns eating strategies to improve overall health, including an approach to cook more at home. Recommendations include thinking of animal protein as a side on your plate rather than center stage and focusing instead on lower calorie dense options like vegetables, fruits, whole grains, and plant-based proteins, such as beans. Making sauces in large quantities to freeze for later and leaving the skin on your vegetables are also recommended to maximize your experience.  Cooking - Healthy Salads and Dressing Clinical staff conducted group or individual video education with verbal and written material and guidebook.   Patient learns that vegetables, fruits, whole grains, and legumes are the foundations of the Pritikin Eating Plan. Recommendations include how to incorporate each of these in flavorful and healthy salads, and how to create homemade salad dressings. Proper handling of ingredients is also covered. Cooking - Soups and [employer] - Soups and Desserts Clinical staff conducted group or individual video education with verbal and written material and guidebook.  Patient learns that Pritikin soups and desserts make for easy, nutritious, and delicious snacks and meal components that are low in sodium, fat, sugar, and calorie density, while high in vitamins, minerals, and filling fiber. Recommendations include simple and healthy ideas for soups and desserts.   Overview     The Pritikin Solution Program Overview Clinical staff conducted group or individual video education with verbal and written material and guidebook.  Patient learns that the results of the Pritikin Program have been documented in more than 100 articles published in peer-reviewed journals, and the benefits include reducing risk factors for (and, in some cases, even reversing) high cholesterol, high blood pressure, type 2 diabetes, obesity, and more! An overview of the three key pillars of the Pritikin Program will be covered: eating well, doing regular exercise, and having a healthy mind-set.  WORKSHOPS  Exercise: Exercise Basics: Building Your Action Plan Clinical staff led group instruction and group discussion with PowerPoint presentation and patient guidebook. To enhance the learning environment the use of posters, models and videos may be added. At the conclusion of this workshop, patients will comprehend the difference between physical activity and exercise, as well as the benefits of incorporating both, into their routine. Patients will understand the FITT (Frequency, Intensity, Time, and Type) principle and how to use it to  build an exercise action plan. In addition, safety concerns and other considerations for exercise and cardiac rehab will be addressed by the presenter. The purpose of this lesson is to promote a comprehensive and effective weekly exercise routine in order to improve patients' overall level of fitness.   Managing Heart Disease: Your Path to a Healthier Heart Clinical staff led group instruction and group discussion with PowerPoint presentation and patient guidebook. To enhance the learning environment the use of posters, models and videos may be added.At the conclusion of this workshop, patients will understand the anatomy and physiology of the heart. Additionally, they will understand how Pritikin's three pillars impact the risk factors, the progression, and the management of heart disease.  The purpose of this lesson is to provide a high-level overview of the heart, heart disease, and how the Pritikin lifestyle positively impacts risk factors.  Exercise Biomechanics Clinical staff led group instruction and group discussion with PowerPoint presentation  and patient guidebook. To enhance the learning environment the use of posters, models and videos may be added. Patients will learn how the structural parts of their bodies function and how these functions impact their daily activities, movement, and exercise. Patients will learn how to promote a neutral spine, learn how to manage pain, and identify ways to improve their physical movement in order to promote healthy living. The purpose of this lesson is to expose patients to common physical limitations that impact physical activity. Participants will learn practical ways to adapt and manage aches and pains, and to minimize their effect on regular exercise. Patients will learn how to maintain good posture while sitting, walking, and lifting.  Balance Training and Fall Prevention  Clinical staff led group instruction and group discussion with  PowerPoint presentation and patient guidebook. To enhance the learning environment the use of posters, models and videos may be added. At the conclusion of this workshop, patients will understand the importance of their sensorimotor skills (vision, proprioception, and the vestibular system) in maintaining their ability to balance as they age. Patients will apply a variety of balancing exercises that are appropriate for their current level of function. Patients will understand the common causes for poor balance, possible solutions to these problems, and ways to modify their physical environment in order to minimize their fall risk. The purpose of this lesson is to teach patients about the importance of maintaining balance as they age and ways to minimize their risk of falling.  WORKSHOPS   Nutrition:  Fueling a Ship broker led group instruction and group discussion with PowerPoint presentation and patient guidebook. To enhance the learning environment the use of posters, models and videos may be added. Patients will review the foundational principles of the Pritikin Eating Plan and understand what constitutes a serving size in each of the food groups. Patients will also learn Pritikin-friendly foods that are better choices when away from home and review make-ahead meal and snack options. Calorie density will be reviewed and applied to three nutrition priorities: weight maintenance, weight loss, and weight gain. The purpose of this lesson is to reinforce (in a group setting) the key concepts around what patients are recommended to eat and how to apply these guidelines when away from home by planning and selecting Pritikin-friendly options. Patients will understand how calorie density may be adjusted for different weight management goals.  Mindful Eating  Clinical staff led group instruction and group discussion with PowerPoint presentation and patient guidebook. To enhance the learning  environment the use of posters, models and videos may be added. Patients will briefly review the concepts of the Pritikin Eating Plan and the importance of low-calorie dense foods. The concept of mindful eating will be introduced as well as the importance of paying attention to internal hunger signals. Triggers for non-hunger eating and techniques for dealing with triggers will be explored. The purpose of this lesson is to provide patients with the opportunity to review the basic principles of the Pritikin Eating Plan, discuss the value of eating mindfully and how to measure internal cues of hunger and fullness using the Hunger Scale. Patients will also discuss reasons for non-hunger eating and learn strategies to use for controlling emotional eating.  Targeting Your Nutrition Priorities Clinical staff led group instruction and group discussion with PowerPoint presentation and patient guidebook. To enhance the learning environment the use of posters, models and videos may be added. Patients will learn how to determine their genetic susceptibility to disease by  reviewing their family history. Patients will gain insight into the importance of diet as part of an overall healthy lifestyle in mitigating the impact of genetics and other environmental insults. The purpose of this lesson is to provide patients with the opportunity to assess their personal nutrition priorities by looking at their family history, their own health history and current risk factors. Patients will also be able to discuss ways of prioritizing and modifying the Pritikin Eating Plan for their highest risk areas  Menu  Clinical staff led group instruction and group discussion with PowerPoint presentation and patient guidebook. To enhance the learning environment the use of posters, models and videos may be added. Using menus brought in from [employer], or printed from [employer], patients will apply the Pritikin dining out guidelines  that were presented in the [employer] video. Patients will also be able to practice these guidelines in a variety of provided scenarios. The purpose of this lesson is to provide patients with the opportunity to practice hands-on learning of the Pritikin Dining Out guidelines with actual menus and practice scenarios.  Label Reading Clinical staff led group instruction and group discussion with PowerPoint presentation and patient guidebook. To enhance the learning environment the use of posters, models and videos may be added. Patients will review and discuss the Pritikin label reading guidelines presented in Pritikin's Label Reading Educational series video. Using fool labels brought in from local grocery stores and markets, patients will apply the label reading guidelines and determine if the packaged food meet the Pritikin guidelines. The purpose of this lesson is to provide patients with the opportunity to review, discuss, and practice hands-on learning of the Pritikin Label Reading guidelines with actual packaged food labels. Cooking School  Pritikin's [employer] are designed to teach patients ways to prepare quick, simple, and affordable recipes at home. The importance of nutrition's role in chronic disease risk reduction is reflected in its emphasis in the overall Pritikin program. By learning how to prepare essential core Pritikin Eating Plan recipes, patients will increase control over what they eat; be able to customize the flavor of foods without the use of added salt, sugar, or fat; and improve the quality of the food they consume. By learning a set of core recipes which are easily assembled, quickly prepared, and affordable, patients are more likely to prepare more healthy foods at home. These workshops focus on convenient breakfasts, simple entres, side dishes, and desserts which can be prepared with minimal effort and are consistent with nutrition recommendations  for cardiovascular risk reduction. Cooking [employer] are taught by a Armed forces logistics/support/administrative officer (RD) who has been trained by the [employer]. The chef or RD has a clear understanding of the importance of minimizing - if not completely eliminating - added fat, sugar, and sodium in recipes. Throughout the series of Cooking School Workshop sessions, patients will learn about healthy ingredients and efficient methods of cooking to build confidence in their capability to prepare    Cooking School weekly topics:  Adding Flavor- Sodium-Free  Fast and Healthy Breakfasts  Powerhouse Plant-Based Proteins  Satisfying Salads and Dressings  Simple Sides and Sauces  International Cuisine-Spotlight on the [employer] Zones  Delicious Desserts  Savory Soups  [employer] - Meals in a Astronomer Appetizers and Snacks  Comforting Weekend Breakfasts  One-Pot Wonders   Fast Evening Meals  Landscape architect Your Pritikin Plate  WORKSHOPS   Healthy Mindset (Psychosocial):  Focused  Goals, Sustainable Changes Clinical staff led group instruction and group discussion with PowerPoint presentation and patient guidebook. To enhance the learning environment the use of posters, models and videos may be added. Patients will be able to apply effective goal setting strategies to establish at least one personal goal, and then take consistent, meaningful action toward that goal. They will learn to identify common barriers to achieving personal goals and develop strategies to overcome them. Patients will also gain an understanding of how our mind-set can impact our ability to achieve goals and the importance of cultivating a positive and growth-oriented mind-set. The purpose of this lesson is to provide patients with a deeper understanding of how to set and achieve personal goals, as well as the tools and strategies needed to overcome common obstacles which may arise along the  way.  From Head to Heart: The Power of a Healthy Outlook  Clinical staff led group instruction and group discussion with PowerPoint presentation and patient guidebook. To enhance the learning environment the use of posters, models and videos may be added. Patients will be able to recognize and describe the impact of emotions and mood on physical health. They will discover the importance of self-care and explore self-care practices which may work for them. Patients will also learn how to utilize the 4 C's to cultivate a healthier outlook and better manage stress and challenges. The purpose of this lesson is to demonstrate to patients how a healthy outlook is an essential part of maintaining good health, especially as they continue their cardiac rehab journey.  Healthy Sleep for a Healthy Heart Clinical staff led group instruction and group discussion with PowerPoint presentation and patient guidebook. To enhance the learning environment the use of posters, models and videos may be added. At the conclusion of this workshop, patients will be able to demonstrate knowledge of the importance of sleep to overall health, well-being, and quality of life. They will understand the symptoms of, and treatments for, common sleep disorders. Patients will also be able to identify daytime and nighttime behaviors which impact sleep, and they will be able to apply these tools to help manage sleep-related challenges. The purpose of this lesson is to provide patients with a general overview of sleep and outline the importance of quality sleep. Patients will learn about a few of the most common sleep disorders. Patients will also be introduced to the concept of "sleep hygiene," and discover ways to self-manage certain sleeping problems through simple daily behavior changes. Finally, the workshop will motivate patients by clarifying the links between quality sleep and their goals of heart-healthy living.   Recognizing and  Reducing Stress Clinical staff led group instruction and group discussion with PowerPoint presentation and patient guidebook. To enhance the learning environment the use of posters, models and videos may be added. At the conclusion of this workshop, patients will be able to understand the types of stress reactions, differentiate between acute and chronic stress, and recognize the impact that chronic stress has on their health. They will also be able to apply different coping mechanisms, such as reframing negative self-talk. Patients will have the opportunity to practice a variety of stress management techniques, such as deep abdominal breathing, progressive muscle relaxation, and/or guided imagery.  The purpose of this lesson is to educate patients on the role of stress in their lives and to provide healthy techniques for coping with it.  Learning Barriers/Preferences:  Learning Barriers/Preferences - 10/21/23 1400       Learning Barriers/Preferences  Learning Barriers Sight;Hearing;Exercise Concerns    Learning Preferences Group Instruction;Individual Instruction;Skilled Demonstration             Education Topics:  Knowledge Questionnaire Score:  Knowledge Questionnaire Score - 10/21/23 1400       Knowledge Questionnaire Score   Pre Score 24/24             Core Components/Risk Factors/Patient Goals at Admission:  Personal Goals and Risk Factors at Admission - 10/21/23 1317       Core Components/Risk Factors/Patient Goals on Admission   Hypertension Yes    Intervention Provide education on lifestyle modifcations including regular physical activity/exercise, weight management, moderate sodium restriction and increased consumption of fresh fruit, vegetables, and low fat dairy, alcohol moderation, and smoking cessation.;Monitor prescription use compliance.    Expected Outcomes Short Term: Continued assessment and intervention until BP is < 140/31mm HG in hypertensive  participants. < 130/35mm HG in hypertensive participants with diabetes, heart failure or chronic kidney disease.;Long Term: Maintenance of blood pressure at goal levels.    Lipids Yes    Intervention Provide education and support for participant on nutrition & aerobic/resistive exercise along with prescribed medications to achieve LDL 70mg , HDL >40mg .    Expected Outcomes Short Term: Participant states understanding of desired cholesterol values and is compliant with medications prescribed. Participant is following exercise prescription and nutrition guidelines.;Long Term: Cholesterol controlled with medications as prescribed, with individualized exercise RX and with personalized nutrition plan. Value goals: LDL < 70mg , HDL > 40 mg.    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 Introduction to Pritikin education program/intensive cardiac rehab. Initial orientation packet reviewed with patient 30 Day ITP Review. Bode is scheduled to exercise at cardiac rehab on 10/27/23               Comments: See ITP comments.Thayer Headings RN BSN

## 2023-10-27 NOTE — Progress Notes (Signed)
 Daily Session Note  Patient Details  Name: Chad [name] MRN: [id number] Date of Birth: 04-28-1946 Referring Provider:   Flowsheet Row INTENSIVE CARDIAC REHAB ORIENT from 10/21/2023 in [facility]  Referring Provider Dr. Charlton Haws, MD       Encounter Date: 10/27/2023  Check In:  Session Check In - 10/27/23 1430       Check-In   Supervising physician immediately available to respond to emergencies [facility] MD immediately available    Physician(s) Carlos Levering, NP    Location [facility]    Staff Present Hughie Closs BS, ACSM-CEP, Exercise Physiologist;Eugenia Eldredge, RN, Zachery Conch, MS, ACSM-CEP, Exercise Physiologist;Woodson Manus Gunning, MS, ACSM-CEP, CCRP, Exercise Physiologist;Johnny Hale Bogus, MS, Exercise Physiologist    Virtual Visit No    Medication changes reported     Yes    Comments Taking levothyroxine100 mcg Mon through Sat, and 200 mcg on Sundays    Fall or balance concerns reported    No    Warm-up and Cool-down Performed as group-led instruction    Resistance Training Performed No    VAD Patient? No    PAD/SET Patient? No      Pain Assessment   Currently in Pain? No/denies    Pain Score 0-No pain    Multiple Pain Sites No             Capillary Blood Glucose: Results for orders placed or performed during the hospital encounter of 10/27/23 (from the past 24 hours)  Glucose, capillary     Status: Abnormal   Collection Time: 10/27/23  3:43 PM  Result Value Ref Range   Glucose-Capillary 105 (H) 70 - 99 mg/dL     Exercise Prescription Changes - 10/27/23 1623       Response to Exercise   Blood Pressure (Admit) 122/60    Blood Pressure (Exercise) 142/60    Blood Pressure (Exit) 108/60    Heart Rate (Admit) 53 bpm    Heart Rate (Exercise) 61 bpm    Heart Rate (Exit) 55 bpm    Rating of Perceived Exertion (Exercise) 11    Perceived Dyspnea (Exercise) 0    Symptoms 0     Comments Pt first day in the [facility] program    Duration Progress to 30 minutes of  aerobic without signs/symptoms of physical distress    Intensity THRR unchanged      Progression   Progression Continue to progress workloads to maintain intensity without signs/symptoms of physical distress.    Average METs 1.8      Resistance Training   Training Prescription No    Weight 3    Reps 10-15    Time 10 Minutes      NuStep   Level 1    SPM 73    Minutes 20    METs 1.8             Social History   Tobacco Use  Smoking Status Former  Smokeless Tobacco Never    Goals Met:  Exercise tolerated well No report of concerns or symptoms today Strength training completed today  Goals Unmet:  Not Applicable  Comments: Pt started cardiac rehab today.  Pt tolerated light exercise without difficulty. VSS, telemetry-Sinus brady, asymptomatic.  Medication list reconciled. Medication changes noted Pt denies barriers to medicaiton compliance.  PSYCHOSOCIAL ASSESSMENT:  PHQ-1. Pt exhibits positive coping skills, hopeful outlook with supportive family. No psychosocial needs identified at this time, no  psychosocial interventions necessary.    Pt enjoys reading and hopes to increase his physical activity after participating in the program. Kase is somewhat deconditioned.   Pt oriented to exercise equipment and routine.    Understanding verbalized. Thayer Headings RN BSN    Dr. Armanda Magic is Medical Director for Cardiac Rehab at [facility].

## 2023-10-28 NOTE — Telephone Encounter (Signed)
 I have called twice and left messages, with no return call

## 2023-10-29 ENCOUNTER — Encounter (HOSPITAL_COMMUNITY)
Admission: RE | Admit: 2023-10-29 | Discharge: 2023-10-29 | Disposition: A | Discharge status: Home / Self Care | Source: Ambulatory Visit | Attending: Cardiovascular Disease | Admitting: Cardiovascular Disease

## 2023-10-29 DIAGNOSIS — Z951 Presence of aortocoronary bypass graft: Secondary | ICD-10-CM

## 2023-10-29 DIAGNOSIS — Z952 Presence of prosthetic heart valve: Secondary | ICD-10-CM

## 2023-10-29 LAB — GLUCOSE, CAPILLARY: Glucose-Capillary: 150 mg/dL — ABNORMAL HIGH (ref 70–99)

## 2023-10-29 NOTE — Telephone Encounter (Signed)
 MyChart message sent to patient.

## 2023-11-01 ENCOUNTER — Encounter (HOSPITAL_COMMUNITY)
Admission: RE | Admit: 2023-11-01 | Discharge: 2023-11-01 | Disposition: A | Discharge status: Home / Self Care | Source: Ambulatory Visit | Attending: Cardiovascular Disease | Admitting: Cardiovascular Disease

## 2023-11-01 DIAGNOSIS — Z951 Presence of aortocoronary bypass graft: Secondary | ICD-10-CM | POA: Diagnosis not present

## 2023-11-01 DIAGNOSIS — Z952 Presence of prosthetic heart valve: Secondary | ICD-10-CM

## 2023-11-01 LAB — GLUCOSE, CAPILLARY
Glucose-Capillary: 181 mg/dL — ABNORMAL HIGH (ref 70–99)
Glucose-Capillary: 178 mg/dL — ABNORMAL HIGH (ref 70–99)
Glucose-Capillary: 157 mg/dL — ABNORMAL HIGH (ref 70–99)

## 2023-11-03 ENCOUNTER — Encounter (HOSPITAL_COMMUNITY)

## 2023-11-04 ENCOUNTER — Telehealth: Payer: Self-pay | Admitting: Hematology and Oncology

## 2023-11-05 ENCOUNTER — Encounter (HOSPITAL_COMMUNITY)
Admission: RE | Admit: 2023-11-05 | Discharge: 2023-11-05 | Disposition: A | Discharge status: Home / Self Care | Source: Ambulatory Visit | Attending: Cardiovascular Disease | Admitting: Cardiovascular Disease

## 2023-11-05 DIAGNOSIS — Z951 Presence of aortocoronary bypass graft: Secondary | ICD-10-CM

## 2023-11-05 DIAGNOSIS — Z952 Presence of prosthetic heart valve: Secondary | ICD-10-CM

## 2023-11-05 LAB — GLUCOSE, CAPILLARY: Glucose-Capillary: 129 mg/dL — ABNORMAL HIGH (ref 70–99)

## 2023-11-08 ENCOUNTER — Encounter (HOSPITAL_COMMUNITY)
Admission: RE | Admit: 2023-11-08 | Discharge: 2023-11-08 | Disposition: A | Discharge status: Home / Self Care | Source: Ambulatory Visit | Attending: Cardiovascular Disease | Admitting: Cardiovascular Disease

## 2023-11-08 DIAGNOSIS — Z951 Presence of aortocoronary bypass graft: Secondary | ICD-10-CM

## 2023-11-08 DIAGNOSIS — Z952 Presence of prosthetic heart valve: Secondary | ICD-10-CM

## 2023-11-08 LAB — GLUCOSE, CAPILLARY: Glucose-Capillary: 160 mg/dL — ABNORMAL HIGH (ref 70–99)

## 2023-11-10 ENCOUNTER — Other Ambulatory Visit

## 2023-11-10 ENCOUNTER — Encounter: Admitting: Thoracic Surgery (Cardiothoracic Vascular Surgery)

## 2023-11-10 ENCOUNTER — Encounter: Admitting: Hematology and Oncology

## 2023-11-11 DIAGNOSIS — E039 Hypothyroidism, unspecified: Secondary | ICD-10-CM | POA: Diagnosis not present

## 2023-11-12 ENCOUNTER — Encounter (HOSPITAL_COMMUNITY)
Admission: RE | Admit: 2023-11-12 | Discharge: 2023-11-12 | Disposition: A | Discharge status: Home / Self Care | Source: Ambulatory Visit | Attending: Cardiovascular Disease | Admitting: Cardiovascular Disease

## 2023-11-12 DIAGNOSIS — Z952 Presence of prosthetic heart valve: Secondary | ICD-10-CM

## 2023-11-12 DIAGNOSIS — Z951 Presence of aortocoronary bypass graft: Secondary | ICD-10-CM

## 2023-11-12 NOTE — Progress Notes (Signed)
 CARDIAC REHAB PHASE 2  Reviewed home exercise with pt today. Pt is tolerating exercise well. Pt will continue to exercise on his own by walking and using the stepper at [facility] for 30 minutes per session 1-2 days a week in addition to the 3 days in CRP2. Advised pt on THRR, RPE scale, hydration and temperature/humidity precautions. Reinforced S/S to stop exercise and when to call MD vs 911. Encouraged warm up cool down and stretches with exercise sessions. Pt verbalized understanding, all questions were answered and pt was given a copy to take home.    Lochlyn Zullo S Hellena Pridgen ACSM-CEP 11/12/2023 4:29 PM

## 2023-11-15 ENCOUNTER — Encounter (HOSPITAL_COMMUNITY)
Admission: RE | Admit: 2023-11-15 | Discharge: 2023-11-15 | Disposition: A | Discharge status: Home / Self Care | Source: Ambulatory Visit | Attending: Cardiovascular Disease

## 2023-11-15 DIAGNOSIS — Z952 Presence of prosthetic heart valve: Secondary | ICD-10-CM

## 2023-11-15 DIAGNOSIS — Z951 Presence of aortocoronary bypass graft: Secondary | ICD-10-CM

## 2023-11-15 DIAGNOSIS — I4891 Unspecified atrial fibrillation: Secondary | ICD-10-CM | POA: Diagnosis not present

## 2023-11-15 LAB — GLUCOSE, CAPILLARY: Glucose-Capillary: 158 mg/dL — ABNORMAL HIGH (ref 70–99)

## 2023-11-15 NOTE — Progress Notes (Signed)
 Cardiology Office Note:    Date:  11/29/2023   ID:  Chad [name], DOB 12-17-1945, MRN [id number]  PCP:  Merl Star, MD   [facility] Providers Cardiologist:  Janelle Mediate, MD Electrophysiologist:  Ardeen Kohler, MD     Referring MD: Merl Star, MD   No chief complaint on file.  History of Present Illness:    Chad [name] is a 78 y.o. male with a hx of HTN, HLD, CKD IV (baseline Cr 1.8), and DM2 who was recently found to have severe AS and 3V CAD who presents for post-CABG/AVR (Dr. Frosty Jews at [facility]) follow up.   Mr. Chad [name]  was referred to Dr. Lorie Rook for possible TAVR given new, symptomatic severe aortic stenosis. Unfortunately pre TAVR cardiac cath showed significant CAD with LM equivalent. I felt that CABG/AVR would be more suitable than TAVR. However CRF and risk of dialysis post pump run raised concerns Ultimately he ended up having CABG/AVR at [facility]  Surgical details 25 mm Perceval rapid deployment bovine pericardial valve and CABG with LIMA to LAD, SVG OM Hospitalized for 11 days due to ARF/CRF PAF and urinary retention   Course was complicated by significant AKI with a peak Cr >5.0, urinary retention requiring catheter placement followed by urology, and new post-op atrial fibrillation with significant ventricular ectopy treated with amiodarone  and mexiletine with plans for EP referral. Not anticoagulated.   Son is a GI physician in [place] . Patient sees Dr [name] from [facility]. Cr improved to 2.8 with K 3.5 and BUN 43 on 08/31/23  Urinating a lot. Sternum healing Has referral to cardiac rehab at [facility] Beta blocker reduced for bradycardia only 12.5 mg Toprol  at night   He has had left pleural effusion drained twice. Last thoracentesis was 09/29/23  Last CXR 10/25/23 with moderate recurrence Was to be referred to CVTS for further management and possible Pleur X catheter he has been on lasix  60 mg daily and hesitant to increase further for effusion  given his CRF.   Echo done 10/01/23 post AVR with EF 60-65% Normal RV AVR with mean gradient 10 peak 17.5 mmhg DVI 0.64 AVA 2.7 cm2  Monitor 11/15/23: average HR 55 bpm range 46-74 bpm PAC/PVC < 1% and no PAF   Sees Dr Luna Salinas next week for effusion will update CXR with left lateral decubitus  Past Medical History:  Diagnosis Date   Cataract    CKD (chronic kidney disease)    Colon polyps    Diabetes (HCC)    Diabetic polyneuropathy associated with type 2 diabetes mellitus (HCC) 01/15/2017   Diabetic retinopathy (HCC)    GERD (gastroesophageal reflux disease)    Hypercholesterolemia    Hypertension    Hypertensive retinopathy    Hypothyroidism    Polyneuropathy    Proteinuria    Severe aortic stenosis     Past Surgical History:  Procedure Laterality Date   CARDIAC SURGERY     IR THORACENTESIS ASP PLEURAL SPACE W/IMG GUIDE  09/29/2023   RIGHT/LEFT HEART CATH AND CORONARY ANGIOGRAPHY N/A 05/21/2023   Procedure: RIGHT/LEFT HEART CATH AND CORONARY ANGIOGRAPHY;  Surgeon: Kyra Phy, MD;  Location: [facility];  Service: Cardiovascular;  Laterality: N/A;    Current Medications: Current Meds  Medication Sig   amLODipine  (NORVASC ) 5 MG tablet Take 1 tablet (5 mg total) by mouth daily.   aspirin  81 MG chewable tablet Chew 81 mg by mouth daily.   B-D ULTRAFINE III SHORT PEN 31G X  8 MM MISC SMARTSIG:1 Each SUB-Q Daily   gabapentin  (NEURONTIN ) 300 MG capsule TAKE 2 CAPSULES BY MOUTH TWICE A DAY   insulin  glargine (LANTUS ) 100 UNIT/ML injection Inject 14 Units into the skin at bedtime.   JARDIANCE  10 MG TABS tablet Take 10 mg by mouth daily.   levothyroxine  (SYNTHROID ) 100 MCG tablet Take 1 tablet (100 mcg total) by mouth daily before breakfast.   metoprolol  succinate (TOPROL  XL) 25 MG 24 hr tablet Take 0.5 tablets (12.5 mg total) by mouth daily.   ONETOUCH VERIO test strip    polyethylene glycol (MIRALAX  / GLYCOLAX ) 17 g packet Take 17 g by mouth as needed for moderate  constipation.   rosuvastatin  (CRESTOR ) 20 MG tablet Take 20 mg by mouth daily.     Allergies:   Pork-derived products   Social History   Socioeconomic History   Marital status: Married    Spouse name: Not on file   Number of children: 2   Years of education: college   Highest education level: Not on file  Occupational History   Occupation: Health visitor  Tobacco Use   Smoking status: Former   Smokeless tobacco: Never  Advertising account planner   Vaping status: Never Used  Substance and Sexual Activity   Alcohol use: Yes   Drug use: No   Sexual activity: Not Currently  Other Topics Concern   Not on file  Social History Narrative   Patient lives with wife in a 2 story home.  Has 2 children.  Works as a Health visitor.  Education: college.       Right handed   Social Drivers of Health   Financial Resource Strain: Low Risk  (08/11/2023)   Received from [facility]   Overall Financial Resource Strain (CARDIA)    Difficulty of Paying Living Expenses: Not hard at all  Food Insecurity: No Food Insecurity (09/29/2023)   Hunger Vital Sign    Worried About Running Out of Food in the Last Year: Never true    Ran Out of Food in the Last Year: Never true  Transportation Needs: No Transportation Needs (09/29/2023)   PRAPARE - Administrator, Civil Service (Medical): No    Lack of Transportation (Non-Medical): No  Physical Activity: Not on file  Stress: Not on file  Social Connections: Socially Integrated (09/29/2023)   Social Connection and Isolation Panel [NHANES]    Frequency of Communication with Friends and Family: More than three times a week    Frequency of Social Gatherings with Friends and Family: More than three times a week    Attends Religious Services: More than 4 times per year    Active Member of [employer] or Organizations: No    Attends Engineer, structural: 1 to 4 times per year    Marital Status: Married    Family  History: The patient's family history includes Glaucoma in his father; Stroke in his father.  ROS:   Please see the history of present illness.     All other systems reviewed and are negative.  EKGs/Labs/Other Studies Reviewed:    Monitor 11/15/23 Study Highlights    Patch Wear Time:  13 days and 23 hours (2025-03-28T16:10:11-398 to 2025-04-11T16:09:57-0400)   Patient had a min HR of 46 bpm, max HR of 74 bpm, and avg HR of 55 bpm. Predominant underlying rhythm was Sinus Rhythm. Isolated SVEs were rare (<1.0%), SVE Couplets were rare (<1.0%), and no SVE Triplets were present. Isolated VEs  were occasional (1.1%,  [id number]), VE Couplets were rare (<1.0%, 2), and no VE Triplets were present. Ventricular Bigeminy was present.   Echocardiogram 10/01/23 IMPRESSIONS     1. Left ventricular ejection fraction, by estimation, is 60 to 65%. The  left ventricle has normal function. The left ventricle has no regional  wall motion abnormalities. Left ventricular diastolic parameters were  normal.   2. Right ventricular systolic function is normal. The right ventricular  size is normal.   3. The mitral valve is abnormal. No evidence of mitral valve  regurgitation. No evidence of mitral stenosis.   4. Post AVR with hybrid 25 mm Liva Nova Perceval rapid deployment valve  normal gradients and no PVL. The aortic valve has been repaired/replaced.  Aortic valve regurgitation is not visualized. No aortic stenosis is  present. There is a 25 mm Liva Nova  Perceval rapid deployment/sutureless bovine pericardial valve valve  present in the aortic position. Procedure Date: 08/10/23.   5. The inferior vena cava is normal in size with greater than 50%  respiratory variability, suggesting right atrial pressure of 3 mmHg.    AORTIC VALVE  AV Area (Vmax):    2.17 cm  AV Area (Vmean):   2.33 cm  AV Area (VTI):     2.67 cm  AV Vmax:           209.00 cm/s  AV Vmean:          151.000 cm/s  AV VTI:             0.407 m  AV Peak Grad:      17.5 mmHg  AV Mean Grad:      10.0 mmHg  LVOT Vmax:         109.00 cm/s  LVOT Vmean:        84.700 cm/s  LVOT VTI:          0.262 m  LVOT/AV VTI ratio: 0.64   Recent Labs: 09/30/2023: TSH 21.123 10/01/2023: B Natriuretic Peptide 354.5; Magnesium 2.0 10/20/2023: ALT 17; BUN 41; Creatinine, Ser 3.55; Hemoglobin 10.4; Platelets 101; Potassium 4.7; Sodium 136  Recent Lipid Panel No results found for: "CHOL", "TRIG", "HDL", "CHOLHDL", "VLDL", "LDLCALC", "LDLDIRECT" Risk Assessment/Calculations:    CHA2DS2-VASc Score = 5    This indicates a 7.2% annual risk of stroke. The patient's score is based upon: CHF History: 0 HTN History: 1 Diabetes History: 1 Stroke History: 0 Vascular Disease History: 1 Age Score: 2 Gender Score: 0   Physical Exam:    VS:  BP 126/64   Pulse (!) 59   Ht 5\' 8"  (1.727 m)   Wt 177 lb (80.3 kg)   SpO2 97%   BMI 26.91 kg/m     Wt Readings from Last 3 Encounters:  11/29/23 177 lb (80.3 kg)  10/21/23 184 lb 11.9 oz (83.8 kg)  10/20/23 179 lb 6.4 oz (81.4 kg)    Elderly male Post sternotomy SEM through AVR Abdomen benign Lungs decreased BS/effusion left base/mid Plus one LE edema  ASSESSMENT/PLAN:    CAD s/p CABG/AVR: 25 mm Percael rapid deployment valve and CABG with LIMA to LAD and SVG OM. Will order TTE to assess baseline gradients and EF continue ASA, lopressor  Has had some orthostasis so no further Rx  TTE 10/01/23 with EF 60-65% AVR with normal mean gradient 10 mmHg , Peak 17.5 mmHg no PVL  Post op atrial fibrillation/ventricular ectopy: Dr Carolynne Citron suggested weaning amiodarone  and d/c mexiletine due to renal issues Had  monitor  Placed at [facility] and not anticoagulated due to brief post op nature of PAF Off both now  Discussed taking pulse and using pulse oximeter to check for recurrence as well Beta blocker reduced for bradycardia Toprol  12.5 mg once/day now   CKD stage IV complicated by post-op AKI: Peak Cr during  hospital course >5 with a discharge Cr 1/25 at 4.7. Most recent 10/20/23 3.55   Urinary retention: Requiring urinary catheter however removed after able to void. No further issues and son reports no follow up required.   HTN:  post op orthostatism only on beta blocker now    DM2: Continue insulin , jardiance . Follows with Dr. Joice Nares this week.   Pleural Effusion:  thoracentesis x 2 with recurrence by CXR 10/25/23 Last IR thoracentesis was 09/29/23 update CXR f/u Luna Salinas next week Present and significant On exam today    F/U EP F/U Nephrology CXR F/U [facility]   F/U with me in 6 months   Signed, Janelle Mediate, MD  11/29/2023 2:12 PM    [facility]

## 2023-11-16 NOTE — Progress Notes (Signed)
 Cardiac Individual Treatment Plan  Patient Details  Name: Chad [name] MRN: [id number] Date of Birth: 10/01/1945 Referring Provider:   Flowsheet Row INTENSIVE CARDIAC REHAB ORIENT from 10/21/2023 in [facility]  Referring Provider Dr. Janelle Mediate, MD       Initial Encounter Date:  Flowsheet Row INTENSIVE CARDIAC REHAB ORIENT from 10/21/2023 in [facility]  Date 10/21/23       Visit Diagnosis: S/P CABG x 2 at [facility]  S/P AVR (aortic valve replacement) at [facility]  Patient's Home Medications on Admission:  Current Outpatient Medications:    amLODipine  (NORVASC ) 5 MG tablet, Take 1 tablet (5 mg total) by mouth daily., Disp: 30 tablet, Rfl: 1   aspirin  81 MG chewable tablet, Chew 81 mg by mouth daily., Disp: , Rfl:    B-D ULTRAFINE III SHORT PEN 31G X 8 MM MISC, SMARTSIG:1 Each SUB-Q Daily, Disp: , Rfl:    furosemide  (LASIX ) 20 MG tablet, Take 3 tablets (60 mg total) by mouth daily., Disp: 90 tablet, Rfl: 3   gabapentin  (NEURONTIN ) 300 MG capsule, TAKE 2 CAPSULES BY MOUTH TWICE A DAY, Disp: 360 capsule, Rfl: 0   insulin  glargine (LANTUS ) 100 UNIT/ML injection, Inject 14 Units into the skin at bedtime., Disp: , Rfl:    JARDIANCE  10 MG TABS tablet, Take 10 mg by mouth daily., Disp: , Rfl:    levothyroxine  (SYNTHROID ) 100 MCG tablet, Take 1 tablet (100 mcg total) by mouth daily before breakfast., Disp: 30 tablet, Rfl: 0   metoprolol  succinate (TOPROL  XL) 25 MG 24 hr tablet, Take 0.5 tablets (12.5 mg total) by mouth daily., Disp: 45 tablet, Rfl: 1   ONETOUCH VERIO test strip, , Disp: , Rfl:    polyethylene glycol (MIRALAX  / GLYCOLAX ) 17 g packet, Take 17 g by mouth as needed for moderate constipation., Disp: , Rfl:    rosuvastatin  (CRESTOR ) 20 MG tablet, Take 20 mg by mouth daily., Disp: , Rfl:   Past Medical History: Past Medical History:  Diagnosis Date   Cataract    CKD  (chronic kidney disease)    Colon polyps    Diabetes (HCC)    Diabetic polyneuropathy associated with type 2 diabetes mellitus (HCC) 01/15/2017   Diabetic retinopathy (HCC)    GERD (gastroesophageal reflux disease)    Hypercholesterolemia    Hypertension    Hypertensive retinopathy    Hypothyroidism    Polyneuropathy    Proteinuria    Severe aortic stenosis     Tobacco Use: Social History   Tobacco Use  Smoking Status Former  Smokeless Tobacco Never    Labs: Review Flowsheet       Latest Ref Rng & Units 05/21/2023  Labs for ITP Cardiac and Pulmonary Rehab  PH, Arterial 7.35 - 7.45 7.316   PCO2 arterial 32 - 48 mmHg 38.9   Bicarbonate 20.0 - 28.0 mmol/L 20.5  21.0  19.9   TCO2 22 - 32 mmol/L 22  22  21    Acid-base deficit 0.0 - 2.0 mmol/L 6.0  6.0  6.0   O2 Saturation % 61  65  91     Details       Multiple values from one day are sorted in reverse-chronological order         Capillary Blood Glucose: Lab Results  Component Value Date   GLUCAP 158 (H) 11/12/2023   GLUCAP 129 (H) 11/05/2023   GLUCAP  160 (H) 11/05/2023   GLUCAP 157 (H) 11/01/2023   GLUCAP 178 (H) 11/01/2023     Exercise Target Goals: Exercise Program Goal: Individual exercise prescription set using results from initial 6 min walk test and THRR while considering  patient's activity barriers and safety.   Exercise Prescription Goal: Initial exercise prescription builds to 30-45 minutes a day of aerobic activity, 2-3 days per week.  Home exercise guidelines will be given to patient during program as part of exercise prescription that the participant will acknowledge.  Activity Barriers & Risk Stratification:  Activity Barriers & Cardiac Risk Stratification - 10/21/23 1535       Activity Barriers & Cardiac Risk Stratification   Activity Barriers Assistive Device;Deconditioning;Back Problems;Shortness of Breath;Balance Concerns;History of Falls    Cardiac Risk Stratification High              6 Minute Walk:  6 Minute Walk     Row Name 10/21/23 1533         6 Minute Walk   Phase Initial  Nustep test     Distance 1279 feet     Walk Time 6 minutes     # of Rest Breaks 0     MPH 2.42     METS 2.09     RPE 9     Perceived Dyspnea  1     VO2 Peak 7.31     Symptoms Yes (comment)     Comments No symptoms: 0.39km, 454 total steps, 75Avg spm, 1.6MET, 16 AVG Watts     Resting HR 51 bpm     Resting BP 118/62     Resting Oxygen Saturation  95 %     Exercise Oxygen Saturation  during 6 min walk 98 %     Max Ex. HR 53 bpm     Max Ex. BP 138/72     2 Minute Post BP 130/60              Oxygen Initial Assessment:   Oxygen Re-Evaluation:   Oxygen Discharge (Final Oxygen Re-Evaluation):   Initial Exercise Prescription:  Initial Exercise Prescription - 10/21/23 1300       Date of Initial Exercise RX and Referring Provider   Date 10/21/23    Referring Provider Dr. Janelle Mediate, MD    Expected Discharge Date 01/12/24      NuStep   Level 1    SPM 60    Minutes 20    METs 1.5      Prescription Details   Frequency (times per week) 3    Duration Progress to 30 minutes of continuous aerobic without signs/symptoms of physical distress      Intensity   THRR 40-80% of Max Heartrate 58-115    Ratings of Perceived Exertion 11-13    Perceived Dyspnea 0-4      Progression   Progression Continue progressive overload as per policy without signs/symptoms or physical distress.      Resistance Training   Training Prescription Yes    Weight 3    Reps 10-15             Perform Capillary Blood Glucose checks as needed.  Exercise Prescription Changes:   Exercise Prescription Changes     Row Name 10/27/23 1623 11/12/23 1625           Response to Exercise   Blood Pressure (Admit) 122/60 122/70      Blood Pressure (Exercise) 142/60 144/76      Blood  Pressure (Exit) 108/60 128/60      Heart Rate (Admit) 53 bpm 54 bpm      Heart Rate (Exercise) 61  bpm 93 bpm      Heart Rate (Exit) 55 bpm 51 bpm      Rating of Perceived Exertion (Exercise) 11 11      Perceived Dyspnea (Exercise) 0 0      Symptoms 0 0      Comments Pt first day in the [facility] ICR program Reviewed MET's, goals and home ExRx      Duration Progress to 30 minutes of  aerobic without signs/symptoms of physical distress Progress to 30 minutes of  aerobic without signs/symptoms of physical distress      Intensity THRR unchanged THRR unchanged        Progression   Progression Continue to progress workloads to maintain intensity without signs/symptoms of physical distress. Continue to progress workloads to maintain intensity without signs/symptoms of physical distress.      Average METs 1.8 2.1        Resistance Training   Training Prescription No Yes      Weight 3 3      Reps 10-15 10-15      Time 10 Minutes 10 Minutes        NuStep   Level 1 2      SPM 73 83      Minutes 20 33      METs 1.8 2.1        Home Exercise Plan   Plans to continue exercise at -- Home (comment)      Frequency -- Add 2 additional days to program exercise sessions.      Initial Home Exercises Provided -- 11/12/23               Exercise Comments:   Exercise Comments     Row Name 10/27/23 1628 11/12/23 1629         Exercise Comments Pt first day in the [facility] ICR program. Pt tolerated exercise well with an average MET level of 1.8. Pt is learning his THRR, RPE and ExRx Reviewed MET's, goals and home ExRx. Pt tolerated exercise well with an average MET level of 2.1. Pt is feeling good about his goals and is increasing strength and stamina. He's very happy that hes getting stronger to go on a big family trip to [place] [facility] in Aug. He will add in exercise on his own by walking and using the stepper at [facility] 1-2 days for about 30 mins a session               Exercise Goals and Review:   Exercise Goals     Row Name 10/21/23 1316             Exercise Goals    Increase Physical Activity Yes       Intervention Provide advice, education, support and counseling about physical activity/exercise needs.;Develop an individualized exercise prescription for aerobic and resistive training based on initial evaluation findings, risk stratification, comorbidities and participant's personal goals.       Expected Outcomes Short Term: Attend rehab on a regular basis to increase amount of physical activity.;Long Term: Exercising regularly at least 3-5 days a week.;Long Term: Add in home exercise to make exercise part of routine and to increase amount of physical activity.       Increase Strength and Stamina Yes       Intervention Provide advice, education, support  and counseling about physical activity/exercise needs.;Develop an individualized exercise prescription for aerobic and resistive training based on initial evaluation findings, risk stratification, comorbidities and participant's personal goals.       Expected Outcomes Short Term: Increase workloads from initial exercise prescription for resistance, speed, and METs.;Short Term: Perform resistance training exercises routinely during rehab and add in resistance training at home;Long Term: Improve cardiorespiratory fitness, muscular endurance and strength as measured by increased METs and functional capacity ( )       Able to understand and use rate of perceived exertion (RPE) scale Yes       Intervention Provide education and explanation on how to use RPE scale       Expected Outcomes Short Term: Able to use RPE daily in rehab to express subjective intensity level;Long Term:  Able to use RPE to guide intensity level when exercising independently       Knowledge and understanding of Target Heart Rate Range (THRR) Yes       Intervention Provide education and explanation of THRR including how the numbers were predicted and where they are located for reference       Expected Outcomes Short Term: Able to state/look up  THRR;Long Term: Able to use THRR to govern intensity when exercising independently;Short Term: Able to use daily as guideline for intensity in rehab       Understanding of Exercise Prescription Yes       Intervention Provide education, explanation, and written materials on patient's individual exercise prescription       Expected Outcomes Short Term: Able to explain program exercise prescription;Long Term: Able to explain home exercise prescription to exercise independently                Exercise Goals Re-Evaluation :  Exercise Goals Re-Evaluation     Row Name 10/27/23 1626 11/12/23 1627           Exercise Goal Re-Evaluation   Exercise Goals Review Increase Physical Activity;Understanding of Exercise Prescription;Increase Strength and Stamina;Knowledge and understanding of Target Heart Rate Range (THRR);Able to understand and use rate of perceived exertion (RPE) scale Increase Physical Activity;Understanding of Exercise Prescription;Increase Strength and Stamina;Knowledge and understanding of Target Heart Rate Range (THRR);Able to understand and use rate of perceived exertion (RPE) scale      Comments Pt first day in the [facility] ICR program. Pt tolerated exercise well with an average MET level of 1.8. Pt is learning his THRR, RPE and ExRx Reviewed MET's, goals and home ExRx. Pt tolerated exercise well with an average MET level of 2.1. Pt is feeling good about his goals and is increasing strength and stamina. He's very happy that hes getting stronger to go on a big family trip to [facility] in Aug. He will add in exercise on his own by walking and using the stepper at [facility] 1-2 days for about 30 mins a session      Expected Outcomes Will continue to monitor pt and progress workloads as tolerated without sign or symptoms Will continue to monitor pt and progress workloads as tolerated without sign or symptoms               Discharge Exercise Prescription (Final Exercise  Prescription Changes):  Exercise Prescription Changes - 11/12/23 1625       Response to Exercise   Blood Pressure (Admit) 122/70    Blood Pressure (Exercise) 144/76    Blood Pressure (Exit) 128/60    Heart Rate (Admit) 54 bpm  Heart Rate (Exercise) 93 bpm    Heart Rate (Exit) 51 bpm    Rating of Perceived Exertion (Exercise) 11    Perceived Dyspnea (Exercise) 0    Symptoms 0    Comments Reviewed MET's, goals and home ExRx    Duration Progress to 30 minutes of  aerobic without signs/symptoms of physical distress    Intensity THRR unchanged      Progression   Progression Continue to progress workloads to maintain intensity without signs/symptoms of physical distress.    Average METs 2.1      Resistance Training   Training Prescription Yes    Weight 3    Reps 10-15    Time 10 Minutes      NuStep   Level 2    SPM 83    Minutes 33    METs 2.1      Home Exercise Plan   Plans to continue exercise at Home (comment)    Frequency Add 2 additional days to program exercise sessions.    Initial Home Exercises Provided 11/12/23             Nutrition:  Target Goals: Understanding of nutrition guidelines, daily intake of sodium 1500mg , cholesterol 200mg , calories 30% from fat and 7% or less from saturated fats, daily to have 5 or more servings of fruits and vegetables.  Biometrics:  Pre Biometrics - 10/21/23 1309       Pre Biometrics   Waist Circumference 40 inches    Hip Circumference 43 inches    Waist to Hip Ratio 0.93 %    Triceps Skinfold 7 mm    % Body Fat 24.9 %    Grip Strength 26 kg    Flexibility --   unable to reach   Single Leg Stand --   Assist device. No attempt             Nutrition Therapy Plan and Nutrition Goals:  Nutrition Therapy & Goals - 10/27/23 1543       Nutrition Therapy   Diet Heart Healthy Diet    Drug/Food Interactions Statins/Certain Fruits      Personal Nutrition Goals   Nutrition Goal Patient to identify strategies for  reducing cardiovascular risk by attending the [facility] education and nutrition series weekly.    Personal Goal #2 Patient to improve diet quality by using the plate method as a guide for meal planning to include lean protein/plant protein, fruits, vegetables, whole grains, nonfat dairy as part of a well-balanced diet.    Comments Kruze has medical history of CAD s/p CABG x2, AVR, HTN, hyperlipidemia, stage IV kidney disease,DM2.  A1c is well controlled. He continues regular follow-up with nephrology. His wife is a good support.   Patient will benefit from participation in intensive cardiac rehab for nutrition, exercise, and lifestyle modification.      Intervention Plan   Intervention Prescribe, educate and counsel regarding individualized specific dietary modifications aiming towards targeted core components such as weight, hypertension, lipid management, diabetes, heart failure and other comorbidities.;Nutrition handout(s) given to patient.    Expected Outcomes Short Term Goal: Understand basic principles of dietary content, such as calories, fat, sodium, cholesterol and nutrients.;Long Term Goal: Adherence to prescribed nutrition plan.             Nutrition Assessments:  MEDIFICTS Score Key: >=70 Need to make dietary changes  40-70 Heart Healthy Diet <= 40 Therapeutic Level Cholesterol Diet    Picture Your Plate Scores: <16 Unhealthy dietary pattern with  much room for improvement. 41-50 Dietary pattern unlikely to meet recommendations for good health and room for improvement. 51-60 More healthful dietary pattern, with some room for improvement.  >60 Healthy dietary pattern, although there may be some specific behaviors that could be improved.    Nutrition Goals Re-Evaluation:  Nutrition Goals Re-Evaluation     Row Name 10/27/23 1543             Goals   Current Weight 184 lb 11.9 oz (83.8 kg)       Comment Cr 2.235, GFR 17, A1c 6.0, unable to review most recent lipid panel.        Expected Outcome Ollivander has medical history of CAD s/p CABG x2, AVR, HTN, hyperlipidemia, stage IV kidney disease,DM2. A1c is well controlled. He continues regular follow-up with nephrology. His wife is a good support. Patient will benefit from participation in intensive cardiac rehab for nutrition, exercise, and lifestyle modification.                Nutrition Goals Re-Evaluation:  Nutrition Goals Re-Evaluation     Row Name 10/27/23 1543             Goals   Current Weight 184 lb 11.9 oz (83.8 kg)       Comment Cr 2.235, GFR 17, A1c 6.0, unable to review most recent lipid panel.       Expected Outcome Blaze has medical history of CAD s/p CABG x2, AVR, HTN, hyperlipidemia, stage IV kidney disease,DM2. A1c is well controlled. He continues regular follow-up with nephrology. His wife is a good support. Patient will benefit from participation in intensive cardiac rehab for nutrition, exercise, and lifestyle modification.                Nutrition Goals Discharge (Final Nutrition Goals Re-Evaluation):  Nutrition Goals Re-Evaluation - 10/27/23 1543       Goals   Current Weight 184 lb 11.9 oz (83.8 kg)    Comment Cr 2.235, GFR 17, A1c 6.0, unable to review most recent lipid panel.    Expected Outcome Ingvald has medical history of CAD s/p CABG x2, AVR, HTN, hyperlipidemia, stage IV kidney disease,DM2. A1c is well controlled. He continues regular follow-up with nephrology. His wife is a good support. Patient will benefit from participation in intensive cardiac rehab for nutrition, exercise, and lifestyle modification.             Psychosocial: Target Goals: Acknowledge presence or absence of significant depression and/or stress, maximize coping skills, provide positive support system. Participant is able to verbalize types and ability to use techniques and skills needed for reducing stress and depression.  Initial Review & Psychosocial Screening:  Initial Psych Review &  Screening - 10/21/23 1356       Initial Review   Current issues with None Identified      Family Dynamics   Good Support System? Yes   Sidharth has his wife two children and grandchildren for support     Barriers   Psychosocial barriers to participate in program There are no identifiable barriers or psychosocial needs.      Screening Interventions   Interventions Encouraged to exercise             Quality of Life Scores:  Quality of Life - 10/21/23 1538       Quality of Life   Select Quality of Life      Quality of Life Scores   Health/Function Pre 26.8 %  Socioeconomic Pre 28.33 %    Psych/Spiritual Pre 26.93 %    Family Pre 28.8 %    GLOBAL Pre 27.41 %            Scores of 19 and below usually indicate a poorer quality of life in these areas.  A difference of  2-3 points is a clinically meaningful difference.  A difference of 2-3 points in the total score of the Quality of Life Index has been associated with significant improvement in overall quality of life, self-image, physical symptoms, and general health in studies assessing change in quality of life.  PHQ-9: Review Flowsheet       10/21/2023  Depression screen PHQ 2/9  Decreased Interest 0  Down, Depressed, Hopeless 0  PHQ - 2 Score 0  Altered sleeping 0  Tired, decreased energy 0  Change in appetite 0  Feeling bad or failure about yourself  0  Trouble concentrating 0  Moving slowly or fidgety/restless 0  Suicidal thoughts 0  PHQ-9 Score 0   Interpretation of Total Score  Total Score Depression Severity:  1-4 = Minimal depression, 5-9 = Mild depression, 10-14 = Moderate depression, 15-19 = Moderately severe depression, 20-27 = Severe depression   Psychosocial Evaluation and Intervention:   Psychosocial Re-Evaluation:  Psychosocial Re-Evaluation     Row Name 10/28/23 1138 11/16/23 1720           Psychosocial Re-Evaluation   Current issues with None Identified None Identified       Interventions Encouraged to attend Cardiac Rehabilitation for the exercise Encouraged to attend Cardiac Rehabilitation for the exercise      Continue Psychosocial Services  No Follow up required No Follow up required               Psychosocial Discharge (Final Psychosocial Re-Evaluation):  Psychosocial Re-Evaluation - 11/16/23 1720       Psychosocial Re-Evaluation   Current issues with None Identified    Interventions Encouraged to attend Cardiac Rehabilitation for the exercise    Continue Psychosocial Services  No Follow up required             Vocational Rehabilitation: Provide vocational rehab assistance to qualifying candidates.   Vocational Rehab Evaluation & Intervention:  Vocational Rehab - 10/21/23 1357       Initial Vocational Rehab Evaluation & Intervention   Assessment shows need for Vocational Rehabilitation No   Clevester is retired and does not need vocational rehab at this time            Education: Education Goals: Education classes will be provided on a weekly basis, covering required topics. Participant will state understanding/return demonstration of topics presented.    Education     Row Name 10/27/23 1400     Education   Cardiac Education Topics Pritikin   Customer service manager   Weekly Topic Fast Evening Meals   Instruction Review Code 1- Verbalizes Understanding   Class Start Time 1400   Class Stop Time 1440   Class Time Calculation (min) 40 min    Row Name 10/29/23 1400     Education   Cardiac Education Topics Pritikin   Select Core Videos     Core Videos   Educator Dietitian   Select Nutrition   Nutrition Vitamins and Minerals   Instruction Review Code 1- Verbalizes Understanding   Class Start Time 1400   Class Stop Time 1440   Class Time Calculation (  min) 40 min    Row Name 11/01/23 1500     Education   Cardiac Education Topics Pritikin   Firefighter Nutrition   Nutrition Workshop Fueling a Forensic psychologist   Instruction Review Code 1- Teaching laboratory technician Start Time 1400   Class Stop Time 1440   Class Time Calculation (min) 40 min    Row Name 11/05/23 1400     Education   Cardiac Education Topics Pritikin   Psychologist, forensic Exercise Education   Exercise Education Improving Performance   Instruction Review Code 1- Verbalizes Understanding   Class Start Time 1400   Class Stop Time 1435   Class Time Calculation (min) 35 min    Row Name 11/08/23 1700     Education   Cardiac Education Topics Pritikin   [employer]     Workshops   Educator Exercise Physiologist   Select Psychosocial   Psychosocial Workshop Healthy Sleep for a Healthy Heart   Instruction Review Code 1- Verbalizes Understanding   Class Start Time 1403   Class Stop Time 1457   Class Time Calculation (min) 54 min    Row Name 11/12/23 1500     Education   Cardiac Education Topics Pritikin   Nurse, children's Exercise Physiologist   Select Psychosocial   Psychosocial How Our Thoughts Can Heal Our Hearts   Instruction Review Code 1- Verbalizes Understanding   Class Start Time 1400   Class Stop Time 1440   Class Time Calculation (min) 40 min    Row Name 11/15/23 1500     Education   Cardiac Education Topics Pritikin   Select Workshops     Workshops   Educator Exercise Physiologist   Select Exercise   Exercise Workshop Managing Heart Disease: Your Path to a Healthier Heart   Instruction Review Code 1- Verbalizes Understanding   Class Start Time 1400   Class Stop Time 1448   Class Time Calculation (min) 48 min            Core Videos: Exercise    Move It!  Clinical staff conducted group or individual video education with verbal and written material and guidebook.  Patient learns the recommended Pritikin  exercise program. Exercise with the goal of living a long, healthy life. Some of the health benefits of exercise include controlled diabetes, healthier blood pressure levels, improved cholesterol levels, improved heart and lung capacity, improved sleep, and better body composition. Everyone should speak with their doctor before starting or changing an exercise routine.  Biomechanical Limitations Clinical staff conducted group or individual video education with verbal and written material and guidebook.  Patient learns how biomechanical limitations can impact exercise and how we can mitigate and possibly overcome limitations to have an impactful and balanced exercise routine.  Body Composition Clinical staff conducted group or individual video education with verbal and written material and guidebook.  Patient learns that body composition (ratio of muscle mass to fat mass) is a key component to assessing overall fitness, rather than body weight alone. Increased fat mass, especially visceral belly fat, can put us  at increased risk for metabolic syndrome, type 2 diabetes, heart disease, and even death. It is recommended to combine diet and exercise (cardiovascular and resistance training) to improve your body composition. Seek  guidance from your physician and exercise physiologist before implementing an exercise routine.  Exercise Action Plan Clinical staff conducted group or individual video education with verbal and written material and guidebook.  Patient learns the recommended strategies to achieve and enjoy long-term exercise adherence, including variety, self-motivation, self-efficacy, and positive decision making. Benefits of exercise include fitness, good health, weight management, more energy, better sleep, less stress, and overall well-being.  Medical   Heart Disease Risk Reduction Clinical staff conducted group or individual video education with verbal and written material and guidebook.   Patient learns our heart is our most vital organ as it circulates oxygen, nutrients, white blood cells, and hormones throughout the entire body, and carries waste away. Data supports a plant-based eating plan like the Pritikin Program for its effectiveness in slowing progression of and reversing heart disease. The video provides a number of recommendations to address heart disease.   Metabolic Syndrome and Belly Fat  Clinical staff conducted group or individual video education with verbal and written material and guidebook.  Patient learns what metabolic syndrome is, how it leads to heart disease, and how one can reverse it and keep it from coming back. You have metabolic syndrome if you have 3 of the following 5 criteria: abdominal obesity, high blood pressure, high triglycerides, low HDL cholesterol, and high blood sugar.  Hypertension and Heart Disease Clinical staff conducted group or individual video education with verbal and written material and guidebook.  Patient learns that high blood pressure, or hypertension, is very common in the [place] . Hypertension is largely due to excessive salt intake, but other important risk factors include being overweight, physical inactivity, drinking too much alcohol, smoking, and not eating enough potassium from fruits and vegetables. High blood pressure is a leading risk factor for heart attack, stroke, congestive heart failure, dementia, kidney failure, and premature death. Long-term effects of excessive salt intake include stiffening of the arteries and thickening of heart muscle and organ damage. Recommendations include ways to reduce hypertension and the risk of heart disease.  Diseases of Our Time - Focusing on Diabetes Clinical staff conducted group or individual video education with verbal and written material and guidebook.  Patient learns why the best way to stop diseases of our time is prevention, through food and other lifestyle changes.  Medicine (such as prescription pills and surgeries) is often only a Band-Aid on the problem, not a long-term solution. Most common diseases of our time include obesity, type 2 diabetes, hypertension, heart disease, and cancer. The Pritikin Program is recommended and has been proven to help reduce, reverse, and/or prevent the damaging effects of metabolic syndrome.  Nutrition   Overview of the Pritikin Eating Plan  Clinical staff conducted group or individual video education with verbal and written material and guidebook.  Patient learns about the Pritikin Eating Plan for disease risk reduction. The Pritikin Eating Plan emphasizes a wide variety of unrefined, minimally-processed carbohydrates, like fruits, vegetables, whole grains, and legumes. Go, Caution, and Stop food choices are explained. Plant-based and lean animal proteins are emphasized. Rationale provided for low sodium intake for blood pressure control, low added sugars for blood sugar stabilization, and low added fats and oils for coronary artery disease risk reduction and weight management.  Calorie Density  Clinical staff conducted group or individual video education with verbal and written material and guidebook.  Patient learns about calorie density and how it impacts the Pritikin Eating Plan. Knowing the characteristics of the food you choose will help you decide  whether those foods will lead to weight gain or weight loss, and whether you want to consume more or less of them. Weight loss is usually a side effect of the Pritikin Eating Plan because of its focus on low calorie-dense foods.  Label Reading  Clinical staff conducted group or individual video education with verbal and written material and guidebook.  Patient learns about the Pritikin recommended label reading guidelines and corresponding recommendations regarding calorie density, added sugars, sodium content, and whole grains.  Dining Out - Part 1  Clinical staff conducted  group or individual video education with verbal and written material and guidebook.  Patient learns that restaurant meals can be sabotaging because they can be so high in calories, fat, sodium, and/or sugar. Patient learns recommended strategies on how to positively address this and avoid unhealthy pitfalls.  Facts on Fats  Clinical staff conducted group or individual video education with verbal and written material and guidebook.  Patient learns that lifestyle modifications can be just as effective, if not more so, as many medications for lowering your risk of heart disease. A Pritikin lifestyle can help to reduce your risk of inflammation and atherosclerosis (cholesterol build-up, or plaque, in the artery walls). Lifestyle interventions such as dietary choices and physical activity address the cause of atherosclerosis. A review of the types of fats and their impact on blood cholesterol levels, along with dietary recommendations to reduce fat intake is also included.  Nutrition Action Plan  Clinical staff conducted group or individual video education with verbal and written material and guidebook.  Patient learns how to incorporate Pritikin recommendations into their lifestyle. Recommendations include planning and keeping personal health goals in mind as an important part of their success.  Healthy Mind-Set    Healthy Minds, Bodies, Hearts  Clinical staff conducted group or individual video education with verbal and written material and guidebook.  Patient learns how to identify when they are stressed. Video will discuss the impact of that stress, as well as the many benefits of stress management. Patient will also be introduced to stress management techniques. The way we think, act, and feel has an impact on our hearts.  How Our Thoughts Can Heal Our Hearts  Clinical staff conducted group or individual video education with verbal and written material and guidebook.  Patient learns that negative  thoughts can cause depression and anxiety. This can result in negative lifestyle behavior and serious health problems. Cognitive behavioral therapy is an effective method to help control our thoughts in order to change and improve our emotional outlook.  Additional Videos:  Exercise    Improving Performance  Clinical staff conducted group or individual video education with verbal and written material and guidebook.  Patient learns to use a non-linear approach by alternating intensity levels and lengths of time spent exercising to help burn more calories and lose more body fat. Cardiovascular exercise helps improve heart health, metabolism, hormonal balance, blood sugar control, and recovery from fatigue. Resistance training improves strength, endurance, balance, coordination, reaction time, metabolism, and muscle mass. Flexibility exercise improves circulation, posture, and balance. Seek guidance from your physician and exercise physiologist before implementing an exercise routine and learn your capabilities and proper form for all exercise.  Introduction to Yoga  Clinical staff conducted group or individual video education with verbal and written material and guidebook.  Patient learns about yoga, a discipline of the coming together of mind, breath, and body. The benefits of yoga include improved flexibility, improved range of motion, better posture  and core strength, increased lung function, weight loss, and positive self-image. Yoga's heart health benefits include lowered blood pressure, healthier heart rate, decreased cholesterol and triglyceride levels, improved immune function, and reduced stress. Seek guidance from your physician and exercise physiologist before implementing an exercise routine and learn your capabilities and proper form for all exercise.  Medical   Aging: Enhancing Your Quality of Life  Clinical staff conducted group or individual video education with verbal and written  material and guidebook.  Patient learns key strategies and recommendations to stay in good physical health and enhance quality of life, such as prevention strategies, having an advocate, securing a Health Care Proxy and Power of Attorney, and keeping a list of medications and system for tracking them. It also discusses how to avoid risk for bone loss.  Biology of Weight Control  Clinical staff conducted group or individual video education with verbal and written material and guidebook.  Patient learns that weight gain occurs because we consume more calories than we burn (eating more, moving less). Even if your body weight is normal, you may have higher ratios of fat compared to muscle mass. Too much body fat puts you at increased risk for cardiovascular disease, heart attack, stroke, type 2 diabetes, and obesity-related cancers. In addition to exercise, following the Pritikin Eating Plan can help reduce your risk.  Decoding Lab Results  Clinical staff conducted group or individual video education with verbal and written material and guidebook.  Patient learns that lab test reflects one measurement whose values change over time and are influenced by many factors, including medication, stress, sleep, exercise, food, hydration, pre-existing medical conditions, and more. It is recommended to use the knowledge from this video to become more involved with your lab results and evaluate your numbers to speak with your doctor.   Diseases of Our Time - Overview  Clinical staff conducted group or individual video education with verbal and written material and guidebook.  Patient learns that according to the CDC, 50% to 70% of chronic diseases (such as obesity, type 2 diabetes, elevated lipids, hypertension, and heart disease) are avoidable through lifestyle improvements including healthier food choices, listening to satiety cues, and increased physical activity.  Sleep Disorders Clinical staff conducted group  or individual video education with verbal and written material and guidebook.  Patient learns how good quality and duration of sleep are important to overall health and well-being. Patient also learns about sleep disorders and how they impact health along with recommendations to address them, including discussing with a physician.  Nutrition  Dining Out - Part 2 Clinical staff conducted group or individual video education with verbal and written material and guidebook.  Patient learns how to plan ahead and communicate in order to maximize their dining experience in a healthy and nutritious manner. Included are recommended food choices based on the type of restaurant the patient is visiting.   Fueling a Banker conducted group or individual video education with verbal and written material and guidebook.  There is a strong connection between our food choices and our health. Diseases like obesity and type 2 diabetes are very prevalent and are in large-part due to lifestyle choices. The Pritikin Eating Plan provides plenty of food and hunger-curbing satisfaction. It is easy to follow, affordable, and helps reduce health risks.  Menu Workshop  Clinical staff conducted group or individual video education with verbal and written material and guidebook.  Patient learns that restaurant meals can sabotage health goals because  they are often packed with calories, fat, sodium, and sugar. Recommendations include strategies to plan ahead and to communicate with the manager, chef, or server to help order a healthier meal.  Planning Your Eating Strategy  Clinical staff conducted group or individual video education with verbal and written material and guidebook.  Patient learns about the Pritikin Eating Plan and its benefit of reducing the risk of disease. The Pritikin Eating Plan does not focus on calories. Instead, it emphasizes high-quality, nutrient-rich foods. By knowing the  characteristics of the foods, we choose, we can determine their calorie density and make informed decisions.  Targeting Your Nutrition Priorities  Clinical staff conducted group or individual video education with verbal and written material and guidebook.  Patient learns that lifestyle habits have a tremendous impact on disease risk and progression. This video provides eating and physical activity recommendations based on your personal health goals, such as reducing LDL cholesterol, losing weight, preventing or controlling type 2 diabetes, and reducing high blood pressure.  Vitamins and Minerals  Clinical staff conducted group or individual video education with verbal and written material and guidebook.  Patient learns different ways to obtain key vitamins and minerals, including through a recommended healthy diet. It is important to discuss all supplements you take with your doctor.   Healthy Mind-Set    Smoking Cessation  Clinical staff conducted group or individual video education with verbal and written material and guidebook.  Patient learns that cigarette smoking and tobacco addiction pose a serious health risk which affects millions of people. Stopping smoking will significantly reduce the risk of heart disease, lung disease, and many forms of cancer. Recommended strategies for quitting are covered, including working with your doctor to develop a successful plan.  Culinary   Becoming a Set designer conducted group or individual video education with verbal and written material and guidebook.  Patient learns that cooking at home can be healthy, cost-effective, quick, and puts them in control. Keys to cooking healthy recipes will include looking at your recipe, assessing your equipment needs, planning ahead, making it simple, choosing cost-effective seasonal ingredients, and limiting the use of added fats, salts, and sugars.  Cooking - Breakfast and Snacks  Clinical staff  conducted group or individual video education with verbal and written material and guidebook.  Patient learns how important breakfast is to satiety and nutrition through the entire day. Recommendations include key foods to eat during breakfast to help stabilize blood sugar levels and to prevent overeating at meals later in the day. Planning ahead is also a key component.  Cooking - Educational psychologist conducted group or individual video education with verbal and written material and guidebook.  Patient learns eating strategies to improve overall health, including an approach to cook more at home. Recommendations include thinking of animal protein as a side on your plate rather than center stage and focusing instead on lower calorie dense options like vegetables, fruits, whole grains, and plant-based proteins, such as beans. Making sauces in large quantities to freeze for later and leaving the skin on your vegetables are also recommended to maximize your experience.  Cooking - Healthy Salads and Dressing Clinical staff conducted group or individual video education with verbal and written material and guidebook.  Patient learns that vegetables, fruits, whole grains, and legumes are the foundations of the Pritikin Eating Plan. Recommendations include how to incorporate each of these in flavorful and healthy salads, and how to create homemade salad dressings. Proper  handling of ingredients is also covered. Cooking - Soups and [employer] - Soups and Desserts Clinical staff conducted group or individual video education with verbal and written material and guidebook.  Patient learns that Pritikin soups and desserts make for easy, nutritious, and delicious snacks and meal components that are low in sodium, fat, sugar, and calorie density, while high in vitamins, minerals, and filling fiber. Recommendations include simple and healthy ideas for soups and desserts.   Overview     The  Pritikin Solution Program Overview Clinical staff conducted group or individual video education with verbal and written material and guidebook.  Patient learns that the results of the Pritikin Program have been documented in more than 100 articles published in peer-reviewed journals, and the benefits include reducing risk factors for (and, in some cases, even reversing) high cholesterol, high blood pressure, type 2 diabetes, obesity, and more! An overview of the three key pillars of the Pritikin Program will be covered: eating well, doing regular exercise, and having a healthy mind-set.  WORKSHOPS  Exercise: Exercise Basics: Building Your Action Plan Clinical staff led group instruction and group discussion with PowerPoint presentation and patient guidebook. To enhance the learning environment the use of posters, models and videos may be added. At the conclusion of this workshop, patients will comprehend the difference between physical activity and exercise, as well as the benefits of incorporating both, into their routine. Patients will understand the FITT (Frequency, Intensity, Time, and Type) principle and how to use it to build an exercise action plan. In addition, safety concerns and other considerations for exercise and cardiac rehab will be addressed by the presenter. The purpose of this lesson is to promote a comprehensive and effective weekly exercise routine in order to improve patients' overall level of fitness.   Managing Heart Disease: Your Path to a Healthier Heart Clinical staff led group instruction and group discussion with PowerPoint presentation and patient guidebook. To enhance the learning environment the use of posters, models and videos may be added.At the conclusion of this workshop, patients will understand the anatomy and physiology of the heart. Additionally, they will understand how Pritikin's three pillars impact the risk factors, the progression, and the management  of heart disease.  The purpose of this lesson is to provide a high-level overview of the heart, heart disease, and how the Pritikin lifestyle positively impacts risk factors.  Exercise Biomechanics Clinical staff led group instruction and group discussion with PowerPoint presentation and patient guidebook. To enhance the learning environment the use of posters, models and videos may be added. Patients will learn how the structural parts of their bodies function and how these functions impact their daily activities, movement, and exercise. Patients will learn how to promote a neutral spine, learn how to manage pain, and identify ways to improve their physical movement in order to promote healthy living. The purpose of this lesson is to expose patients to common physical limitations that impact physical activity. Participants will learn practical ways to adapt and manage aches and pains, and to minimize their effect on regular exercise. Patients will learn how to maintain good posture while sitting, walking, and lifting.  Balance Training and Fall Prevention  Clinical staff led group instruction and group discussion with PowerPoint presentation and patient guidebook. To enhance the learning environment the use of posters, models and videos may be added. At the conclusion of this workshop, patients will understand the importance of their sensorimotor skills (vision, proprioception, and the vestibular system) in maintaining  their ability to balance as they age. Patients will apply a variety of balancing exercises that are appropriate for their current level of function. Patients will understand the common causes for poor balance, possible solutions to these problems, and ways to modify their physical environment in order to minimize their fall risk. The purpose of this lesson is to teach patients about the importance of maintaining balance as they age and ways to minimize their risk of  falling.  WORKSHOPS   Nutrition:  Fueling a Ship broker led group instruction and group discussion with PowerPoint presentation and patient guidebook. To enhance the learning environment the use of posters, models and videos may be added. Patients will review the foundational principles of the Pritikin Eating Plan and understand what constitutes a serving size in each of the food groups. Patients will also learn Pritikin-friendly foods that are better choices when away from home and review make-ahead meal and snack options. Calorie density will be reviewed and applied to three nutrition priorities: weight maintenance, weight loss, and weight gain. The purpose of this lesson is to reinforce (in a group setting) the key concepts around what patients are recommended to eat and how to apply these guidelines when away from home by planning and selecting Pritikin-friendly options. Patients will understand how calorie density may be adjusted for different weight management goals.  Mindful Eating  Clinical staff led group instruction and group discussion with PowerPoint presentation and patient guidebook. To enhance the learning environment the use of posters, models and videos may be added. Patients will briefly review the concepts of the Pritikin Eating Plan and the importance of low-calorie dense foods. The concept of mindful eating will be introduced as well as the importance of paying attention to internal hunger signals. Triggers for non-hunger eating and techniques for dealing with triggers will be explored. The purpose of this lesson is to provide patients with the opportunity to review the basic principles of the Pritikin Eating Plan, discuss the value of eating mindfully and how to measure internal cues of hunger and fullness using the Hunger Scale. Patients will also discuss reasons for non-hunger eating and learn strategies to use for controlling emotional eating.  Targeting Your  Nutrition Priorities Clinical staff led group instruction and group discussion with PowerPoint presentation and patient guidebook. To enhance the learning environment the use of posters, models and videos may be added. Patients will learn how to determine their genetic susceptibility to disease by reviewing their family history. Patients will gain insight into the importance of diet as part of an overall healthy lifestyle in mitigating the impact of genetics and other environmental insults. The purpose of this lesson is to provide patients with the opportunity to assess their personal nutrition priorities by looking at their family history, their own health history and current risk factors. Patients will also be able to discuss ways of prioritizing and modifying the Pritikin Eating Plan for their highest risk areas  Menu  Clinical staff led group instruction and group discussion with PowerPoint presentation and patient guidebook. To enhance the learning environment the use of posters, models and videos may be added. Using menus brought in from [employer], or printed from [employer], patients will apply the Pritikin dining out guidelines that were presented in the [employer] video. Patients will also be able to practice these guidelines in a variety of provided scenarios. The purpose of this lesson is to provide patients with the opportunity to practice hands-on learning of  the [employer] guidelines with actual menus and practice scenarios.  Label Reading Clinical staff led group instruction and group discussion with PowerPoint presentation and patient guidebook. To enhance the learning environment the use of posters, models and videos may be added. Patients will review and discuss the Pritikin label reading guidelines presented in Pritikin's Label Reading Educational series video. Using fool labels brought in from local grocery stores and markets, patients will apply the  label reading guidelines and determine if the packaged food meet the Pritikin guidelines. The purpose of this lesson is to provide patients with the opportunity to review, discuss, and practice hands-on learning of the Pritikin Label Reading guidelines with actual packaged food labels. Cooking School  [employer] are designed to teach patients ways to prepare quick, simple, and affordable recipes at home. The importance of nutrition's role in chronic disease risk reduction is reflected in its emphasis in the overall [facility]in program. By learning how to prepare essential core P[employer] Eating Plan recipes, patients will increase control over what they eat; be able to customize the flavor of foods without the use of added salt, sugar, or fat; and improve the quality of the food they consume. By learning a set of core recipes which are easily assembled, quickly prepared, and affordable, patients are more likely to prepare more healthy foods at home. These workshops focus on convenient breakfasts, simple entres, side dishes, and desserts which can be prepared with minimal effort and are consistent with nutrition recommendations for cardiovascular risk reduction. Cooking [employer] are taught by a Armed forces logistics/support/administrative officer (RD) who has been trained by the [employer]. The chef or RD has a clear understanding of the importance of minimizing - if not completely eliminating - added fat, sugar, and sodium in recipes. Throughout the series of Cooking School Workshop sessions, patients will learn about healthy ingredients and efficient methods of cooking to build confidence in their capability to prepare    Cooking School weekly topics:  Adding Flavor- Sodium-Free  Fast and Healthy Breakfasts  Powerhouse Plant-Based Proteins  Satisfying Salads and Dressings  Simple Sides and Sauces  International Cuisine-Spotlight on the [employer] Zones  Delicious Desserts  Savory  Soups  [employer] - Meals in a Astronomer Appetizers and Snacks  Comforting Weekend Breakfasts  One-Pot Wonders   Fast Evening Meals  Landscape architect Your Pritikin Plate  WORKSHOPS   Healthy Mindset (Psychosocial):  Focused Goals, Sustainable Changes Clinical staff led group instruction and group discussion with PowerPoint presentation and patient guidebook. To enhance the learning environment the use of posters, models and videos may be added. Patients will be able to apply effective goal setting strategies to establish at least one personal goal, and then take consistent, meaningful action toward that goal. They will learn to identify common barriers to achieving personal goals and develop strategies to overcome them. Patients will also gain an understanding of how our mind-set can impact our ability to achieve goals and the importance of cultivating a positive and growth-oriented mind-set. The purpose of this lesson is to provide patients with a deeper understanding of how to set and achieve personal goals, as well as the tools and strategies needed to overcome common obstacles which may arise along the way.  From Head to Heart: The Power of a Healthy Outlook  Clinical staff led group instruction and group discussion with PowerPoint presentation and patient guidebook. To enhance the learning environment the use of posters, models  and videos may be added. Patients will be able to recognize and describe the impact of emotions and mood on physical health. They will discover the importance of self-care and explore self-care practices which may work for them. Patients will also learn how to utilize the 4 C's to cultivate a healthier outlook and better manage stress and challenges. The purpose of this lesson is to demonstrate to patients how a healthy outlook is an essential part of maintaining good health, especially as they continue their cardiac rehab journey.  Healthy  Sleep for a Healthy Heart Clinical staff led group instruction and group discussion with PowerPoint presentation and patient guidebook. To enhance the learning environment the use of posters, models and videos may be added. At the conclusion of this workshop, patients will be able to demonstrate knowledge of the importance of sleep to overall health, well-being, and quality of life. They will understand the symptoms of, and treatments for, common sleep disorders. Patients will also be able to identify daytime and nighttime behaviors which impact sleep, and they will be able to apply these tools to help manage sleep-related challenges. The purpose of this lesson is to provide patients with a general overview of sleep and outline the importance of quality sleep. Patients will learn about a few of the most common sleep disorders. Patients will also be introduced to the concept of "sleep hygiene," and discover ways to self-manage certain sleeping problems through simple daily behavior changes. Finally, the workshop will motivate patients by clarifying the links between quality sleep and their goals of heart-healthy living.   Recognizing and Reducing Stress Clinical staff led group instruction and group discussion with PowerPoint presentation and patient guidebook. To enhance the learning environment the use of posters, models and videos may be added. At the conclusion of this workshop, patients will be able to understand the types of stress reactions, differentiate between acute and chronic stress, and recognize the impact that chronic stress has on their health. They will also be able to apply different coping mechanisms, such as reframing negative self-talk. Patients will have the opportunity to practice a variety of stress management techniques, such as deep abdominal breathing, progressive muscle relaxation, and/or guided imagery.  The purpose of this lesson is to educate patients on the role of stress in their  lives and to provide healthy techniques for coping with it.  Learning Barriers/Preferences:  Learning Barriers/Preferences - 10/21/23 1400       Learning Barriers/Preferences   Learning Barriers Sight;Hearing;Exercise Concerns    Learning Preferences Group Instruction;Individual Instruction;Skilled Demonstration             Education Topics:  Knowledge Questionnaire Score:  Knowledge Questionnaire Score - 10/21/23 1400       Knowledge Questionnaire Score   Pre Score 24/24             Core Components/Risk Factors/Patient Goals at Admission:  Personal Goals and Risk Factors at Admission - 10/21/23 1317       Core Components/Risk Factors/Patient Goals on Admission   Hypertension Yes    Intervention Provide education on lifestyle modifcations including regular physical activity/exercise, weight management, moderate sodium restriction and increased consumption of fresh fruit, vegetables, and low fat dairy, alcohol moderation, and smoking cessation.;Monitor prescription use compliance.    Expected Outcomes Short Term: Continued assessment and intervention until BP is < 140/42mm HG in hypertensive participants. < 130/72mm HG in hypertensive participants with diabetes, heart failure or chronic kidney disease.;Long Term: Maintenance of blood pressure at goal levels.  Lipids Yes    Intervention Provide education and support for participant on nutrition & aerobic/resistive exercise along with prescribed medications to achieve LDL 70mg , HDL >40mg .    Expected Outcomes Short Term: Participant states understanding of desired cholesterol values and is compliant with medications prescribed. Participant is following exercise prescription and nutrition guidelines.;Long Term: Cholesterol controlled with medications as prescribed, with individualized exercise RX and with personalized nutrition plan. Value goals: LDL < 70mg , HDL > 40 mg.    Personal Goal Other Yes    Personal Goal Pt would  like to increase stamina and get into shape and travel to [place] with his family    Intervention Will continue to monitor pt and progress workloads as tolerated without sign or symptom    Expected Outcomes Pt will achieve his goals and gain strength             Core Components/Risk Factors/Patient Goals Review:   Goals and Risk Factor Review     Row Name 10/28/23 1141 11/16/23 1722           Core Components/Risk Factors/Patient Goals Review   Personal Goals Review Weight Management/Obesity;Lipids;Hypertension;Diabetes Weight Management/Obesity;Lipids;Hypertension;Diabetes      Review Georgie started cardiac rehab on 10/27/23. Irbin did well with exercise. Vital signs and CBg's were stable. Jarrad is somewhat deconditioned. Hakiem is off to a good start  with exercise. Vital signs and CBg's remain stable. Branndon has lost 4.3 kg since starting cardiac rehab.      Expected Outcomes Tejay will continue to participate in cardiac rehab for exercise, nutrtion and lifestyle modifications. Lynwood will continue to participate in cardiac rehab for exercise, nutrtion and lifestyle modifications.               Core Components/Risk Factors/Patient Goals at Discharge (Final Review):   Goals and Risk Factor Review - 11/16/23 1722       Core Components/Risk Factors/Patient Goals Review   Personal Goals Review Weight Management/Obesity;Lipids;Hypertension;Diabetes    Review Jelani is off to a good start  with exercise. Vital signs and CBg's remain stable. Chett has lost 4.3 kg since starting cardiac rehab.    Expected Outcomes Deen will continue to participate in cardiac rehab for exercise, nutrtion and lifestyle modifications.             ITP Comments:  ITP Comments     Row Name 10/21/23 1310 10/27/23 0848 10/28/23 1107 11/16/23 1719     ITP Comments Dr. Gaylyn Keas medical director. Introduction to [facility]in education program/intensive cardiac rehab. Initial orientation packet reviewed  with patient 30 Day ITP Review. Aydrien is scheduled to exercise at cardiac rehab on 10/27/23 30 Day ITP Review. Nyjah started cardiac rehab on 10/28/23. Jahseh did well with exercise for his fitness level 30 Day ITP Review. Aseem has good attendance and participation with exercise at cardiac rehab             Comments: See ITP comments.Monte Antonio RN BSN

## 2023-11-17 ENCOUNTER — Encounter (HOSPITAL_COMMUNITY)
Admission: RE | Admit: 2023-11-17 | Discharge: 2023-11-17 | Disposition: A | Discharge status: Home / Self Care | Source: Ambulatory Visit | Attending: Cardiovascular Disease | Admitting: Cardiovascular Disease

## 2023-11-17 DIAGNOSIS — Z952 Presence of prosthetic heart valve: Secondary | ICD-10-CM

## 2023-11-17 DIAGNOSIS — Z951 Presence of aortocoronary bypass graft: Secondary | ICD-10-CM

## 2023-11-19 ENCOUNTER — Encounter (HOSPITAL_COMMUNITY)
Admission: RE | Admit: 2023-11-19 | Discharge: 2023-11-19 | Disposition: A | Discharge status: Home / Self Care | Source: Ambulatory Visit | Attending: Cardiovascular Disease | Admitting: Cardiovascular Disease

## 2023-11-19 DIAGNOSIS — Z951 Presence of aortocoronary bypass graft: Secondary | ICD-10-CM

## 2023-11-19 DIAGNOSIS — Z952 Presence of prosthetic heart valve: Secondary | ICD-10-CM

## 2023-11-21 DIAGNOSIS — I4891 Unspecified atrial fibrillation: Secondary | ICD-10-CM

## 2023-11-22 ENCOUNTER — Encounter (HOSPITAL_COMMUNITY)
Admission: RE | Admit: 2023-11-22 | Discharge: 2023-11-22 | Disposition: A | Discharge status: Home / Self Care | Source: Ambulatory Visit | Attending: Cardiovascular Disease | Admitting: Cardiovascular Disease

## 2023-11-22 DIAGNOSIS — Z952 Presence of prosthetic heart valve: Secondary | ICD-10-CM

## 2023-11-22 DIAGNOSIS — L570 Actinic keratosis: Secondary | ICD-10-CM | POA: Diagnosis not present

## 2023-11-22 DIAGNOSIS — D225 Melanocytic nevi of trunk: Secondary | ICD-10-CM | POA: Diagnosis not present

## 2023-11-22 DIAGNOSIS — Z951 Presence of aortocoronary bypass graft: Secondary | ICD-10-CM

## 2023-11-22 DIAGNOSIS — N184 Chronic kidney disease, stage 4 (severe): Secondary | ICD-10-CM | POA: Diagnosis not present

## 2023-11-22 DIAGNOSIS — D492 Neoplasm of unspecified behavior of bone, soft tissue, and skin: Secondary | ICD-10-CM | POA: Diagnosis not present

## 2023-11-24 ENCOUNTER — Encounter (HOSPITAL_COMMUNITY)
Admission: RE | Admit: 2023-11-24 | Discharge: 2023-11-24 | Disposition: A | Discharge status: Home / Self Care | Source: Ambulatory Visit | Attending: Cardiovascular Disease | Admitting: Cardiovascular Disease

## 2023-11-24 ENCOUNTER — Encounter: Payer: Self-pay | Admitting: Cardiology

## 2023-11-24 DIAGNOSIS — E1122 Type 2 diabetes mellitus with diabetic chronic kidney disease: Secondary | ICD-10-CM | POA: Diagnosis not present

## 2023-11-24 DIAGNOSIS — N184 Chronic kidney disease, stage 4 (severe): Secondary | ICD-10-CM | POA: Diagnosis not present

## 2023-11-24 DIAGNOSIS — E039 Hypothyroidism, unspecified: Secondary | ICD-10-CM | POA: Diagnosis not present

## 2023-11-24 DIAGNOSIS — Z951 Presence of aortocoronary bypass graft: Secondary | ICD-10-CM | POA: Diagnosis not present

## 2023-11-24 DIAGNOSIS — N2889 Other specified disorders of kidney and ureter: Secondary | ICD-10-CM | POA: Diagnosis not present

## 2023-11-24 DIAGNOSIS — I350 Nonrheumatic aortic (valve) stenosis: Secondary | ICD-10-CM | POA: Diagnosis not present

## 2023-11-24 DIAGNOSIS — E785 Hyperlipidemia, unspecified: Secondary | ICD-10-CM | POA: Diagnosis not present

## 2023-11-24 DIAGNOSIS — Z952 Presence of prosthetic heart valve: Secondary | ICD-10-CM

## 2023-11-24 DIAGNOSIS — I129 Hypertensive chronic kidney disease with stage 1 through stage 4 chronic kidney disease, or unspecified chronic kidney disease: Secondary | ICD-10-CM | POA: Diagnosis not present

## 2023-11-24 NOTE — Progress Notes (Shared)
 [facility] - Clinic Note  12/07/2023    CHIEF COMPLAINT Patient presents for No chief complaint on file.  HISTORY OF PRESENT ILLNESS: Chad [name] is a 78 y.o. male who presents to the clinic today for:    Pt is delayed to follow up from 12 weeks to 14 weeks due to having open heart sx on January 14th, he had an aortic valve replaced and 2 bypasses  Referring physician: Merl Star, MD [address] [place],  [place] [address]  HISTORICAL INFORMATION:   Selected notes from the MEDICAL RECORD NUMBER Referred by Dr. Joanne Muckle for diabetic retinopathy with DME OU [name]:  Ocular Hx- PMH-    CURRENT MEDICATIONS: No current outpatient medications on file. (Ophthalmic Drugs)   No current facility-administered medications for this visit. (Ophthalmic Drugs)   Current Outpatient Medications (Other)  Medication Sig   amLODipine  (NORVASC ) 5 MG tablet Take 1 tablet (5 mg total) by mouth daily.   aspirin  81 MG chewable tablet Chew 81 mg by mouth daily.   B-D ULTRAFINE III SHORT PEN 31G X 8 MM MISC SMARTSIG:1 Each SUB-Q Daily   furosemide  (LASIX ) 20 MG tablet Take 3 tablets (60 mg total) by mouth daily.   gabapentin  (NEURONTIN ) 300 MG capsule TAKE 2 CAPSULES BY MOUTH TWICE A DAY   insulin  glargine (LANTUS ) 100 UNIT/ML injection Inject 14 Units into the skin at bedtime.   JARDIANCE  10 MG TABS tablet Take 10 mg by mouth daily.   levothyroxine  (SYNTHROID ) 100 MCG tablet Take 1 tablet (100 mcg total) by mouth daily before breakfast.   metoprolol  succinate (TOPROL  XL) 25 MG 24 hr tablet Take 0.5 tablets (12.5 mg total) by mouth daily.   ONETOUCH VERIO test strip    polyethylene glycol (MIRALAX  / GLYCOLAX ) 17 g packet Take 17 g by mouth as needed for moderate constipation.   rosuvastatin  (CRESTOR ) 20 MG tablet Take 20 mg by mouth daily.   No current facility-administered medications for this visit. (Other)   REVIEW OF SYSTEMS:    ALLERGIES Allergies   Allergen Reactions   Pork-Derived Products Other (See Comments)    PAST MEDICAL HISTORY Past Medical History:  Diagnosis Date   Cataract    CKD (chronic kidney disease)    Colon polyps    Diabetes (HCC)    Diabetic polyneuropathy associated with type 2 diabetes mellitus (HCC) 01/15/2017   Diabetic retinopathy (HCC)    GERD (gastroesophageal reflux disease)    Hypercholesterolemia    Hypertension    Hypertensive retinopathy    Hypothyroidism    Polyneuropathy    Proteinuria    Severe aortic stenosis    Past Surgical History:  Procedure Laterality Date   CARDIAC SURGERY     IR THORACENTESIS ASP PLEURAL SPACE W/IMG GUIDE  09/29/2023   RIGHT/LEFT HEART CATH AND CORONARY ANGIOGRAPHY N/A 05/21/2023   Procedure: RIGHT/LEFT HEART CATH AND CORONARY ANGIOGRAPHY;  Surgeon: Thukkani, Arun K, MD;  Location: [facility] INVASIVE CV LAB;  Service: Cardiovascular;  Laterality: N/A;    FAMILY HISTORY Family History  Problem Relation Age of Onset   Stroke Father    Glaucoma Father    SOCIAL HISTORY Social History   Tobacco Use   Smoking status: Former   Smokeless tobacco: Never  Advertising account planner   Vaping status: Never Used  Substance Use Topics   Alcohol use: Yes   Drug use: No       OPHTHALMIC EXAM: Not recorded    IMAGING AND  PROCEDURES  Imaging and Procedures for 12/07/2023          ASSESSMENT/PLAN:    ICD-10-CM   1. Moderate nonproliferative diabetic retinopathy of both eyes with macular edema associated with type 2 diabetes mellitus (HCC)  Z61.0960     2. 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 Essential hypertension  I10     4. Hypertensive retinopathy of both eyes  H35.033     5. Combined forms of age-related cataract of both eyes  H25.813        1. Moderate Non-proliferative diabetic retinopathy w/ DME OU  - s/p IVA OD #1 (11.17.22) - s/p IVA OS #1 (11.14.22), #7 (05.12.23), #8 (06.09.23), #12 (02.18.25) - s/p IVA OU #2 (12.15.22), #3 (01.12.23), #4  (02.13.23), #5 (03.16.23), #6 (04.12.23),   **IVA resistance OU**  ============================================================ - s/p IVE OD #1 (05.12.23) -- sample, #2 (06.09.23), #3 (07.07.23), #4 (08.04.23), #5 (09.22.23), #6 (11.07.23), #7 (11.07.23), #8 (01.30.24), #9 (03.12.24), #10 (05.01.24), #11 (06.19.24), #12 (08.23.24), #13 (11.12.24) - s/p IVE OS #1 (07.07.23), #2 (08.04.23), #3 (09.22.23), #4 (11.07.23),#5 (11.07.23), #6 (01.30.24), #7 (03.12.24), #8 (05.01.24), #9 (06.19.24), #10 (08.23.24), #11 (11.12.24) - exam shows scattered MA/DBH OU, +edema and exudates - FA (11.14.22) shows vascular perfusion defects OU, no NV OU, OS with focal macroaneurysm along IT arcades - OCT shows OD: Stable improvement in IRF / IRHM temporal macula and fovea--no fluid; OS: Stable improvement IRF/IRHM centrally, trace persistent cystic changes sup mac at 14 weeks  - BCVA 20/30 OD, 20/25 OS - pt wishes to switch back to IVA OS #13 today, 05.13.25, due to no funding with [employer] -- f/u in 12 weeks again - ok to hold injxn OD -- pt in agreement - pt wishes to proceed with injection OS - RBA of procedure discussed, questions answered - IVA informed consent obtained and re-signed OU, 02.18.25 - see procedure note - f/u in 12 weeks, DFE, OCT, possible injection(s)  2. Retinal macroaneurysm OS  - focal lesion along IT arcades -- regressed  - s/p IVA OS as above  3,4. Hypertensive retinopathy OU - discussed importance of tight BP control - continue to monitor  5.  Mixed Cataract OU  - being monitored by Dr. Joanne Muckle - The symptoms of cataract, surgical options, and treatments and risks were discussed with patient.  - discussed diagnosis and progression - approaching visual significance   Ophthalmic Meds Ordered this visit:  No orders of the defined types were placed in this encounter.    No follow-ups on file.  There are no Patient Instructions on file for this visit.   Explained the  diagnoses, plan, and follow up with the patient and they expressed understanding.  Patient expressed understanding of the importance of proper follow up care.   This document serves as a record of services personally performed by Jeanice Millard, MD, PhD. It was created on their behalf by Olene Berne, COT an ophthalmic technician. The creation of this record is the provider's dictation and/or activities during the visit.    Electronically signed by:  Olene Berne, COT  11/24/23 8:11 AM  Jeanice Millard, M.D., Ph.D. Diseases & Surgery of the Retina and Vitreous [facility]    Abbreviations: M myopia (nearsighted); A astigmatism; H hyperopia (farsighted); P presbyopia; Mrx spectacle prescription;  CTL contact lenses; OD right eye; OS left eye; OU both eyes  XT exotropia; ET esotropia; PEK punctate epithelial keratitis;  PEE punctate epithelial erosions; DES dry eye syndrome; MGD meibomian gland dysfunction; ATs artificial tears; PFAT's preservative free artificial tears; NSC nuclear sclerotic cataract; PSC posterior subcapsular cataract; ERM epi-retinal membrane; PVD posterior vitreous detachment; RD retinal detachment; DM diabetes mellitus; DR diabetic retinopathy; NPDR non-proliferative diabetic retinopathy; PDR proliferative diabetic retinopathy; CSME clinically significant macular edema; DME diabetic macular edema; dbh dot blot hemorrhages; CWS cotton wool spot; POAG primary open angle glaucoma; C/D cup-to-disc ratio; HVF humphrey visual field; GVF goldmann visual field; OCT optical coherence tomography; IOP intraocular pressure; BRVO Branch retinal vein occlusion; CRVO central retinal vein occlusion; CRAO central retinal artery occlusion; BRAO branch retinal artery occlusion; RT retinal tear; SB scleral buckle; PPV pars plana vitrectomy; VH Vitreous hemorrhage; PRP panretinal laser photocoagulation; IVK intravitreal kenalog; VMT vitreomacular traction; MH Macular  hole;  NVD neovascularization of the disc; NVE neovascularization elsewhere; AREDS age related eye disease study; ARMD age related macular degeneration; POAG primary open angle glaucoma; EBMD epithelial/anterior basement membrane dystrophy; ACIOL anterior chamber intraocular lens; IOL intraocular lens; PCIOL posterior chamber intraocular lens; Phaco/IOL phacoemulsification with intraocular lens placement; PRK photorefractive keratectomy; LASIK laser assisted in situ keratomileusis; HTN hypertension; DM diabetes mellitus; COPD chronic obstructive pulmonary disease

## 2023-11-25 DIAGNOSIS — E039 Hypothyroidism, unspecified: Secondary | ICD-10-CM | POA: Diagnosis not present

## 2023-11-26 ENCOUNTER — Encounter (HOSPITAL_COMMUNITY)
Admission: RE | Admit: 2023-11-26 | Discharge: 2023-11-26 | Disposition: A | Discharge status: Home / Self Care | Source: Ambulatory Visit | Attending: Cardiovascular Disease | Admitting: Cardiovascular Disease

## 2023-11-26 DIAGNOSIS — Z951 Presence of aortocoronary bypass graft: Secondary | ICD-10-CM | POA: Diagnosis not present

## 2023-11-26 DIAGNOSIS — Z952 Presence of prosthetic heart valve: Secondary | ICD-10-CM | POA: Insufficient documentation

## 2023-11-26 DIAGNOSIS — J90 Pleural effusion, not elsewhere classified: Secondary | ICD-10-CM | POA: Diagnosis not present

## 2023-11-29 ENCOUNTER — Ambulatory Visit: Payer: PPO | Attending: Cardiovascular Disease | Admitting: Cardiovascular Disease

## 2023-11-29 ENCOUNTER — Encounter (HOSPITAL_COMMUNITY)
Admission: RE | Admit: 2023-11-29 | Discharge: 2023-11-29 | Disposition: A | Discharge status: Home / Self Care | Source: Ambulatory Visit | Attending: Cardiovascular Disease | Admitting: Cardiovascular Disease

## 2023-11-29 ENCOUNTER — Encounter: Payer: Self-pay | Admitting: Cardiovascular Disease

## 2023-11-29 VITALS — BP 126/64 | HR 59 | Ht 68.0 in | Wt 177.0 lb

## 2023-11-29 DIAGNOSIS — Z952 Presence of prosthetic heart valve: Secondary | ICD-10-CM

## 2023-11-29 DIAGNOSIS — Z794 Long term (current) use of insulin: Secondary | ICD-10-CM | POA: Diagnosis not present

## 2023-11-29 DIAGNOSIS — J90 Pleural effusion, not elsewhere classified: Secondary | ICD-10-CM

## 2023-11-29 DIAGNOSIS — I4891 Unspecified atrial fibrillation: Secondary | ICD-10-CM | POA: Diagnosis not present

## 2023-11-29 DIAGNOSIS — E1159 Type 2 diabetes mellitus with other circulatory complications: Secondary | ICD-10-CM

## 2023-11-29 DIAGNOSIS — E118 Type 2 diabetes mellitus with unspecified complications: Secondary | ICD-10-CM

## 2023-11-29 DIAGNOSIS — N184 Chronic kidney disease, stage 4 (severe): Secondary | ICD-10-CM | POA: Diagnosis not present

## 2023-11-29 DIAGNOSIS — Z951 Presence of aortocoronary bypass graft: Secondary | ICD-10-CM | POA: Diagnosis not present

## 2023-11-29 DIAGNOSIS — I152 Hypertension secondary to endocrine disorders: Secondary | ICD-10-CM | POA: Diagnosis not present

## 2023-11-29 LAB — GLUCOSE, CAPILLARY: Glucose-Capillary: 168 mg/dL — ABNORMAL HIGH (ref 70–99)

## 2023-11-29 NOTE — Patient Instructions (Addendum)
 Medication Instructions:  Your physician recommends that you continue on your current medications as directed. Please refer to the Current Medication list given to you today.  *If you need a refill on your cardiac medications before your next appointment, please call your pharmacy*  Lab Work: If you have labs (blood work) drawn today and your tests are completely normal, you will receive your results only by: MyChart Message (if you have MyChart) OR A paper copy in the mail If you have any lab test that is abnormal or we need to change your treatment, we will call you to review the results.  Testing/Procedures: A chest x-ray takes a picture of the organs and structures inside the chest, including the heart, lungs, and blood vessels. This test can show several things, including, whether the heart is enlarges; whether fluid is building up in the lungs; and whether pacemaker / defibrillator leads are still in place.  Follow-Up: At [facility], you and your health needs are our priority.  As part of our continuing mission to provide you with exceptional heart care, our providers are all part of one team.  This team includes your primary Cardiologist (physician) and Advanced Practice Providers or APPs (Physician Assistants and Nurse Practitioners) who all work together to provide you with the care you need, when you need it.  Your next appointment:   3 month(s)  Provider:   Janelle Mediate, MD    We recommend signing up for the patient portal called "MyChart".  Sign up information is provided on this After Visit Summary.  MyChart is used to connect with patients for Virtual Visits (Telemedicine).  Patients are able to view lab/test results, encounter notes, upcoming appointments, etc.  Non-urgent messages can be sent to your provider as well.   To learn more about what you can do with MyChart, go to [email or web address].

## 2023-11-30 ENCOUNTER — Ambulatory Visit (HOSPITAL_COMMUNITY)
Admission: RE | Admit: 2023-11-30 | Discharge: 2023-11-30 | Disposition: A | Discharge status: Home / Self Care | Source: Ambulatory Visit | Attending: Cardiology | Admitting: Cardiology

## 2023-11-30 DIAGNOSIS — J9811 Atelectasis: Secondary | ICD-10-CM | POA: Diagnosis not present

## 2023-11-30 DIAGNOSIS — J90 Pleural effusion, not elsewhere classified: Secondary | ICD-10-CM | POA: Insufficient documentation

## 2023-12-01 ENCOUNTER — Encounter (HOSPITAL_COMMUNITY)
Admission: RE | Admit: 2023-12-01 | Discharge: 2023-12-01 | Disposition: A | Discharge status: Home / Self Care | Source: Ambulatory Visit | Attending: Cardiovascular Disease | Admitting: Cardiovascular Disease

## 2023-12-01 DIAGNOSIS — Z951 Presence of aortocoronary bypass graft: Secondary | ICD-10-CM | POA: Diagnosis not present

## 2023-12-01 DIAGNOSIS — Z952 Presence of prosthetic heart valve: Secondary | ICD-10-CM

## 2023-12-02 NOTE — Progress Notes (Signed)
 [facility] - Clinic Note  12/14/2023    CHIEF COMPLAINT Patient presents for Retina Follow Up  HISTORY OF PRESENT ILLNESS: Chad [name] is a 78 y.o. male who presents to the clinic today for:   HPI     Retina Follow Up   Patient presents with  Diabetic Retinopathy.  In both eyes.  This started 12 weeks ago.  I, the attending physician,  performed the HPI with the patient and updated documentation appropriately.        Comments   Patient feels the vision is the same. When reading he feels the vision is a little fuzzy. He is not using eye drops. His blood sugar was 110.       Last edited by Ronelle Coffee, MD on 12/14/2023  5:28 PM.    Pt is still recovering from heart surgery he had in February, he has not noticed any change in vision  Referring physician: Merl Star, MD [address] [place],  [place] [address]  HISTORICAL INFORMATION:   Selected notes from the MEDICAL RECORD NUMBER Referred by Dr. Joanne Muckle for diabetic retinopathy with DME OU [name]:  Ocular Hx- PMH-    CURRENT MEDICATIONS: No current outpatient medications on file. (Ophthalmic Drugs)   No current facility-administered medications for this visit. (Ophthalmic Drugs)   Current Outpatient Medications (Other)  Medication Sig   amLODipine  (NORVASC ) 5 MG tablet Take 1 tablet (5 mg total) by mouth daily.   aspirin  81 MG chewable tablet Chew 81 mg by mouth daily.   B-D ULTRAFINE III SHORT PEN 31G X 8 MM MISC SMARTSIG:1 Each SUB-Q Daily   furosemide  (LASIX ) 20 MG tablet Take 3 tablets (60 mg total) by mouth daily. (Patient not taking: Reported on 11/29/2023)   gabapentin  (NEURONTIN ) 300 MG capsule TAKE 2 CAPSULES BY MOUTH TWICE A DAY   insulin  glargine (LANTUS ) 100 UNIT/ML injection Inject 14 Units into the skin at bedtime.   JARDIANCE  10 MG TABS tablet Take 10 mg by mouth daily.   levothyroxine  (SYNTHROID ) 100 MCG tablet Take 1 tablet (100 mcg total) by mouth daily before  breakfast.   metoprolol  succinate (TOPROL  XL) 25 MG 24 hr tablet Take 0.5 tablets (12.5 mg total) by mouth daily.   ONETOUCH VERIO test strip    polyethylene glycol (MIRALAX  / GLYCOLAX ) 17 g packet Take 17 g by mouth as needed for moderate constipation.   rosuvastatin  (CRESTOR ) 20 MG tablet Take 20 mg by mouth daily.   No current facility-administered medications for this visit. (Other)   REVIEW OF SYSTEMS: ROS   Positive for: Endocrine, Cardiovascular, Eyes Negative for: Constitutional, Gastrointestinal, Neurological, Skin, Genitourinary, Musculoskeletal, HENT, Respiratory, Psychiatric, Allergic/Imm, Heme/Lymph Last edited by Olene Berne, COT on 12/14/2023  1:51 PM.       ALLERGIES Allergies  Allergen Reactions   Pork-Derived Products Other (See Comments)    PAST MEDICAL HISTORY Past Medical History:  Diagnosis Date   Cataract    CKD (chronic kidney disease)    Colon polyps    Diabetes (HCC)    Diabetic polyneuropathy associated with type 2 diabetes mellitus (HCC) 01/15/2017   Diabetic retinopathy (HCC)    GERD (gastroesophageal reflux disease)    Hypercholesterolemia    Hypertension    Hypertensive retinopathy    Hypothyroidism    Polyneuropathy    Proteinuria    Severe aortic stenosis    Past Surgical History:  Procedure Laterality Date   CARDIAC SURGERY  IR THORACENTESIS ASP PLEURAL SPACE W/IMG GUIDE  09/29/2023   RIGHT/LEFT HEART CATH AND CORONARY ANGIOGRAPHY N/A 05/21/2023   Procedure: RIGHT/LEFT HEART CATH AND CORONARY ANGIOGRAPHY;  Surgeon: Kyra Phy, MD;  Location: [facility];  Service: Cardiovascular;  Laterality: N/A;    FAMILY HISTORY Family History  Problem Relation Age of Onset   Stroke Father    Glaucoma Father    SOCIAL HISTORY Social History   Tobacco Use   Smoking status: Former   Smokeless tobacco: Never  Advertising account planner   Vaping status: Never Used  Substance Use Topics   Alcohol use: Yes   Drug use: No        OPHTHALMIC EXAM: Base Eye Exam     Visual Acuity (Snellen - Linear)       Right Left   Dist [place] 20/40 20/30   Dist ph [place] 20/30 NI         Tonometry (Tonopen, 1:57 PM)       Right Left   Pressure 15 18         Pupils       Dark Light Shape React APD   Right 3 2 Round Brisk None   Left 3 2 Round Brisk None         Visual Fields       Left Right    Full Full         Extraocular Movement       Right Left    Full, Ortho Full, Ortho         Neuro/Psych     Oriented x3: Yes   Mood/Affect: Normal         Dilation     Both eyes: 1.0% Mydriacyl, 2.5% Phenylephrine @ 1:51 PM           Slit Lamp and Fundus Exam     External Exam       Right Left   External Normal Normal         Slit Lamp Exam       Right Left   Lids/Lashes Dermatochalasis - upper lid, mild Telangiectasia Dermatochalasis - upper lid, Meibomian gland dysfunction   Conjunctiva/Sclera White and quiet White and quiet   Cornea 2+ fine, inferior Punctate epithelial erosions 1+ Punctate epithelial erosions, mild tear film debris   Anterior Chamber deep, clear, narrow angles deep, clear, narrow angles   Iris Round and dilated, mild anterior bowing, No NVI Round and dilated, mild anterior bowing, No NVI   Lens 2-3+ Nuclear sclerosis, 2+ Cortical cataract 2-3+ Nuclear sclerosis, 2+ Cortical cataract   Anterior Vitreous Vitreous syneresis Vitreous syneresis         Fundus Exam       Right Left   Disc Pink and Sharp, no NVD Pink and Sharp, no NVD   C/D Ratio 0.3 0.3   Macula good foveal reflex, mild ERM, scattered MA and exudates, central cystic changes -- re-developed Flat, good foveal reflex, minimal MA and focal exudates, trace residual cystic changes superior mac, mild interval increase in cystic changes   Vessels attenuated, Tortuous attenuated, Tortuous, Copper  wiring, Macroaneurysm / CWS along IT arcades - improving   Periphery Attached, scattered MA and CWS greatest  posteriorly Attached, scattered MA/DBH greatest posteriorly, focal CWS inferior to IT arcades--fading           IMAGING AND PROCEDURES  Imaging and Procedures for 12/14/2023  OCT, Retina - OU - Both Eyes  Right Eye Quality was good. Central Foveal Thickness: 370. Progression has worsened. Findings include normal foveal contour, no IRF, no SRF, intraretinal hyper-reflective material, epiretinal membrane, macular pucker (Interval re-development of IRF / IRHM temporal macula and fovea ).   Left Eye Quality was good. Central Foveal Thickness: 292. Progression has worsened. Findings include no SRF, abnormal foveal contour, intraretinal hyper-reflective material, intraretinal fluid (Interval increase in IRF/IRHM centrally, trace persistent cystic changes sup mac ).   Notes  *Images captured and stored on drive  Diagnosis / Impression:  +DME OU OD: Interval re-development of IRF / IRHM temporal macula and fovea  OS: Interval increase in IRF/IRHM centrally, trace persistent cystic changes sup mac   Clinical management:  See below  Abbreviations: NFP - Normal foveal profile. CME - cystoid macular edema. PED - pigment epithelial detachment. IRF - intraretinal fluid. SRF - subretinal fluid. EZ - ellipsoid zone. ERM - epiretinal membrane. ORA - outer retinal atrophy. ORT - outer retinal tubulation. SRHM - subretinal hyper-reflective material. IRHM - intraretinal hyper-reflective material      Intravitreal Injection, Pharmacologic Agent - OD - Right Eye       Time Out 12/14/2023. 2:48 PM. Confirmed correct patient, procedure, site, and patient consented.   Anesthesia Topical anesthesia was used. Anesthetic medications included Lidocaine  2%, Proparacaine 0.5%.   Procedure Preparation included 5% betadine to ocular surface, eyelid speculum. A supplied (32g) needle was used.   Injection: 1.25 mg Bevacizumab  1.25mg /0.74ml   Route: Intravitreal, Site: Right Eye   NDC:  [id number], Lot: [id number], Expiration date: 01/14/2024   Post-op Post injection exam found visual acuity of at least counting fingers. The patient tolerated the procedure well. There were no complications. The patient received written and verbal post procedure care education. Post injection medications were not given.      Intravitreal Injection, Pharmacologic Agent - OS - Left Eye       Time Out 12/14/2023. 2:48 PM. Confirmed correct patient, procedure, site, and patient consented.   Anesthesia Topical anesthesia was used. Anesthetic medications included Lidocaine  2%, Proparacaine 0.5%.   Procedure Preparation included 5% betadine to ocular surface, eyelid speculum. A (32g) needle was used.   Injection: 1.25 mg Bevacizumab  1.25mg /0.19ml   Route: Intravitreal, Site: Left Eye   NDC: [id number], Lot: [id number], Expiration date: 02/29/2024   Post-op Post injection exam found visual acuity of at least counting fingers. The patient tolerated the procedure well. There were no complications. The patient received written and verbal post procedure care education. Post injection medications were not given.             ASSESSMENT/PLAN:    ICD-10-CM   1. Moderate nonproliferative diabetic retinopathy of both eyes with macular edema associated with type 2 diabetes mellitus (HCC)  E11.3313 OCT, Retina - OU - Both Eyes    Intravitreal Injection, Pharmacologic Agent - OD - Right Eye    Intravitreal Injection, Pharmacologic Agent - OS - Left Eye    Bevacizumab  (AVASTIN ) SOLN 1.25 mg    Bevacizumab  (AVASTIN ) SOLN 1.25 mg    2. 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 Essential hypertension  I10     4. Hypertensive retinopathy of both eyes  H35.033     5. Combined forms of age-related cataract of both eyes  H25.813      1. Moderate Non-proliferative diabetic retinopathy w/ DME OU  - s/p IVA OD #1 (11.17.22) - s/p IVA OS #1 (11.14.22), #7 (05.12.23), #8 (06.09.23), #12  (02.18.25) -  s/p IVA OU #2 (12.15.22), #3 (01.12.23), #4 (02.13.23), #5 (03.16.23), #6 (04.12.23) ================================ - s/p IVE OD #1 (05.12.23) -- sample, #2 (06.09.23), #3 (07.07.23), #4 (08.04.23), #5 (09.22.23), #6 (11.07.23), #7 (11.07.23), #8 (01.30.24), #9 (03.12.24), #10 (05.01.24), #11 (06.19.24), #12 (08.23.24), #13 (11.12.24) - s/p IVE OS #1 (07.07.23), #2 (08.04.23), #3 (09.22.23), #4 (11.07.23),#5 (11.07.23), #6 (01.30.24), #7 (03.12.24), #8 (05.01.24), #9 (06.19.24), #10 (08.23.24), #11 (11.12.24) ===================================== - exam shows scattered MA/DBH OU, +edema and exudates - FA (11.14.22) shows vascular perfusion defects OU, no NV OU, OS with focal macroaneurysm along IT arcades - OCT shows OD: Interval re-development of IRF / IRHM temporal macula and fovea at 6 mos since last injxn; OS: Interval increase in IRF/IRHM centrally, trace persistent cystic changes sup mac at 12 weeks  - BCVA OD 20/30 - stable, OS 20/30 from 20/25 - recommend IVA OU (OD #7 and OS #10) today, 05.20.25 w/ f/u back to 6-8 weeks - pt wishes to proceed with injection OU - RBA of procedure discussed, questions answered - IVA informed consent obtained and re-signed OU, 02.18.25 - see procedure note  - no funding for Good Days - f/u in 6-8 weeks, DFE, OCT, possible injection(s)  2. Retinal macroaneurysm OS  - focal lesion along IT arcades -- regressed  - s/p IVA OS as above  3,4. Hypertensive retinopathy OU - discussed importance of tight BP control - continue to monitor  5.  Mixed Cataract OU  - being monitored by Dr. Joanne Muckle - The symptoms of cataract, surgical options, and treatments and risks were discussed with patient.  - discussed diagnosis and progression - approaching visual significance   Ophthalmic Meds Ordered this visit:  Meds ordered this encounter  Medications   Bevacizumab  (AVASTIN ) SOLN 1.25 mg   Bevacizumab  (AVASTIN ) SOLN 1.25 mg     Return for f/u  6-8 weels. NPDR OU, DFE, OCT, Possible Injxn.  There are no Patient Instructions on file for this visit.   Explained the diagnoses, plan, and follow up with the patient and they expressed understanding.  Patient expressed understanding of the importance of proper follow up care.   This document serves as a record of services personally performed by Jeanice Millard, MD, PhD. It was created on their behalf by Olene Berne, COT an ophthalmic technician. The creation of this record is the provider's dictation and/or activities during the visit.    Electronically signed by:  Olene Berne, COT  12/14/23 5:29 PM  Jeanice Millard, M.D., Ph.D. Diseases & Surgery of the Retina and Vitreous [facility]  I have reviewed the above documentation for accuracy and completeness, and I agree with the above. Jeanice Millard, M.D., Ph.D. 12/14/23 5:31 PM   Abbreviations: M myopia (nearsighted); A astigmatism; H hyperopia (farsighted); P presbyopia; Mrx spectacle prescription;  CTL contact lenses; OD right eye; OS left eye; OU both eyes  XT exotropia; ET esotropia; PEK punctate epithelial keratitis; PEE punctate epithelial erosions; DES dry eye syndrome; MGD meibomian gland dysfunction; ATs artificial tears; PFAT's preservative free artificial tears; NSC nuclear sclerotic cataract; PSC posterior subcapsular cataract; ERM epi-retinal membrane; PVD posterior vitreous detachment; RD retinal detachment; DM diabetes mellitus; DR diabetic retinopathy; NPDR non-proliferative diabetic retinopathy; PDR proliferative diabetic retinopathy; CSME clinically significant macular edema; DME diabetic macular edema; dbh dot blot hemorrhages; CWS cotton wool spot; POAG primary open angle glaucoma; C/D cup-to-disc ratio; HVF humphrey visual field; GVF goldmann visual field; OCT optical coherence tomography; IOP intraocular pressure;  BRVO Branch retinal vein occlusion; CRVO central retinal vein  occlusion; CRAO central retinal artery occlusion; BRAO branch retinal artery occlusion; RT retinal tear; SB scleral buckle; PPV pars plana vitrectomy; VH Vitreous hemorrhage; PRP panretinal laser photocoagulation; IVK intravitreal kenalog; VMT vitreomacular traction; MH Macular hole;  NVD neovascularization of the disc; NVE neovascularization elsewhere; AREDS age related eye disease study; ARMD age related macular degeneration; POAG primary open angle glaucoma; EBMD epithelial/anterior basement membrane dystrophy; ACIOL anterior chamber intraocular lens; IOL intraocular lens; PCIOL posterior chamber intraocular lens; Phaco/IOL phacoemulsification with intraocular lens placement; PRK photorefractive keratectomy; LASIK laser assisted in situ keratomileusis; HTN hypertension; DM diabetes mellitus; COPD chronic obstructive pulmonary disease

## 2023-12-03 ENCOUNTER — Encounter (HOSPITAL_COMMUNITY)
Admission: RE | Admit: 2023-12-03 | Discharge: 2023-12-03 | Disposition: A | Discharge status: Home / Self Care | Source: Ambulatory Visit | Attending: Cardiovascular Disease

## 2023-12-03 DIAGNOSIS — Z952 Presence of prosthetic heart valve: Secondary | ICD-10-CM

## 2023-12-03 DIAGNOSIS — Z951 Presence of aortocoronary bypass graft: Secondary | ICD-10-CM | POA: Diagnosis not present

## 2023-12-06 ENCOUNTER — Other Ambulatory Visit: Payer: Self-pay | Admitting: Thoracic Surgery (Cardiothoracic Vascular Surgery)

## 2023-12-06 ENCOUNTER — Encounter (HOSPITAL_COMMUNITY)
Admission: RE | Admit: 2023-12-06 | Discharge: 2023-12-06 | Disposition: A | Discharge status: Home / Self Care | Source: Ambulatory Visit | Attending: Cardiovascular Disease

## 2023-12-06 DIAGNOSIS — J90 Pleural effusion, not elsewhere classified: Secondary | ICD-10-CM

## 2023-12-06 DIAGNOSIS — Z952 Presence of prosthetic heart valve: Secondary | ICD-10-CM

## 2023-12-06 DIAGNOSIS — Z951 Presence of aortocoronary bypass graft: Secondary | ICD-10-CM

## 2023-12-07 ENCOUNTER — Other Ambulatory Visit: Payer: Self-pay | Admitting: Thoracic Surgery (Cardiothoracic Vascular Surgery)

## 2023-12-07 ENCOUNTER — Ambulatory Visit
Attending: Thoracic Surgery (Cardiothoracic Vascular Surgery) | Admitting: Thoracic Surgery (Cardiothoracic Vascular Surgery)

## 2023-12-07 ENCOUNTER — Encounter (INDEPENDENT_AMBULATORY_CARE_PROVIDER_SITE_OTHER): Payer: PPO | Admitting: Ophthalmology

## 2023-12-07 ENCOUNTER — Ambulatory Visit (HOSPITAL_COMMUNITY)
Admission: RE | Admit: 2023-12-07 | Source: Ambulatory Visit | Attending: Thoracic Surgery (Cardiothoracic Vascular Surgery) | Admitting: Thoracic Surgery (Cardiothoracic Vascular Surgery)

## 2023-12-07 ENCOUNTER — Encounter: Payer: Self-pay | Admitting: Thoracic Surgery (Cardiothoracic Vascular Surgery)

## 2023-12-07 VITALS — BP 170/71 | HR 60 | Resp 18 | Ht 68.0 in | Wt 176.0 lb

## 2023-12-07 DIAGNOSIS — I10 Essential (primary) hypertension: Secondary | ICD-10-CM

## 2023-12-07 DIAGNOSIS — J90 Pleural effusion, not elsewhere classified: Secondary | ICD-10-CM | POA: Diagnosis not present

## 2023-12-07 DIAGNOSIS — H35033 Hypertensive retinopathy, bilateral: Secondary | ICD-10-CM

## 2023-12-07 DIAGNOSIS — E113313 Type 2 diabetes mellitus with moderate nonproliferative diabetic retinopathy with macular edema, bilateral: Secondary | ICD-10-CM

## 2023-12-07 DIAGNOSIS — H35012 Changes in retinal vascular appearance, left eye: Secondary | ICD-10-CM

## 2023-12-07 DIAGNOSIS — H25813 Combined forms of age-related cataract, bilateral: Secondary | ICD-10-CM

## 2023-12-07 MED ORDER — PREDNISONE 10 MG (21) PO TBPK: ORAL_TABLET | ORAL | 0 refills | Status: AC

## 2023-12-07 NOTE — Progress Notes (Signed)
 PCP is Merl Star, MD Referring Provider is Loyde Rule, MD  Chief Complaint  Patient presents with   Pericardial Effusion    Surgical consult, CXR 11/30/23/ HX of Thoracentesis    HPI: Chad [name] presents send for consultation regarding left pleural effusion.  Chad [name] is a 78 year old man with a past medical history significant for aortic stenosis, CAD, AVR CABG, diabetes, hypertension, hyperlipidemia, polyneuropathy, hypothyroidism, and reflux.  He had AVR CABG by Dr. Frosty Jews at [facility] in January 2025.  Had some postoperative atrial fibrillation and acute on chronic renal dysfunction.  Presented to the emergency room on 09/29/2023 with shortness of breath.  Found to have a large pleural effusion.  He had a CT which showed a large left pleural effusion.  He had a thoracentesis which drained 1.9 L of fluid.  LDH was elevated.He had an echocardiogram on 10/01/2023 which showed normal EF.  There was no pericardial effusion.  He had a follow-up chest x-ray about 3 weeks later which showed a moderate left pleural effusion.  He was started on Lasix .    Currently is not having any issues with shortness of breath or orthopnea.  He had a decubitus chest x-ray last week which showed some residual layering effusion.   Past Medical History:  Diagnosis Date   Cataract    CKD (chronic kidney disease)    Colon polyps    Diabetes (HCC)    Diabetic polyneuropathy associated with type 2 diabetes mellitus (HCC) 01/15/2017   Diabetic retinopathy (HCC)    GERD (gastroesophageal reflux disease)    Hypercholesterolemia    Hypertension    Hypertensive retinopathy    Hypothyroidism    Polyneuropathy    Proteinuria    Severe aortic stenosis     Past Surgical History:  Procedure Laterality Date   CARDIAC SURGERY     IR THORACENTESIS ASP PLEURAL SPACE W/IMG GUIDE  09/29/2023   RIGHT/LEFT HEART CATH AND CORONARY ANGIOGRAPHY N/A 05/21/2023   Procedure: RIGHT/LEFT HEART CATH AND CORONARY ANGIOGRAPHY;   Surgeon: Kyra Phy, MD;  Location: [facility];  Service: Cardiovascular;  Laterality: N/A;    Family History  Problem Relation Age of Onset   Stroke Father    Glaucoma Father     Social History Social History   Tobacco Use   Smoking status: Former   Smokeless tobacco: Never  Advertising account planner   Vaping status: Never Used  Substance Use Topics   Alcohol use: Yes   Drug use: No    Current Outpatient Medications  Medication Sig Dispense Refill   predniSONE (STERAPRED UNI-PAK 21 TAB) 10 MG (21) TBPK tablet Take 6 tablets (60 mg total) by mouth daily for 1 day, THEN 5 tablets (50 mg total) daily for 1 day, THEN 4 tablets (40 mg total) daily for 1 day, THEN 3 tablets (30 mg total) daily for 1 day, THEN 2 tablets (20 mg total) daily for 1 day, THEN 1 tablet (10 mg total) daily for 1 day. 21 tablet 0   amLODipine  (NORVASC ) 5 MG tablet Take 1 tablet (5 mg total) by mouth daily. 30 tablet 1   aspirin  81 MG chewable tablet Chew 81 mg by mouth daily.     B-D ULTRAFINE III SHORT PEN 31G X 8 MM MISC SMARTSIG:1 Each SUB-Q Daily     furosemide  (LASIX ) 20 MG tablet Take 3 tablets (60 mg total) by mouth daily. (Patient not taking: Reported on 11/29/2023) 90 tablet 3   gabapentin  (NEURONTIN ) 300 MG  capsule TAKE 2 CAPSULES BY MOUTH TWICE A DAY 360 capsule 0   insulin  glargine (LANTUS ) 100 UNIT/ML injection Inject 14 Units into the skin at bedtime.     JARDIANCE  10 MG TABS tablet Take 10 mg by mouth daily.     levothyroxine  (SYNTHROID ) 100 MCG tablet Take 1 tablet (100 mcg total) by mouth daily before breakfast. 30 tablet 0   metoprolol  succinate (TOPROL  XL) 25 MG 24 hr tablet Take 0.5 tablets (12.5 mg total) by mouth daily. 45 tablet 1   ONETOUCH VERIO test strip      polyethylene glycol (MIRALAX  / GLYCOLAX ) 17 g packet Take 17 g by mouth as needed for moderate constipation.     rosuvastatin  (CRESTOR ) 20 MG tablet Take 20 mg by mouth daily.     No current facility-administered medications for  this visit.    Allergies  Allergen Reactions   Pork-Derived Products Other (See Comments)    Review of Systems  Respiratory:  Negative for cough and shortness of breath.   Cardiovascular:  Negative for chest pain.    BP (!) 170/71   Pulse 60   Resp 18   Ht 5\' 8"  (1.727 m)   Wt 176 lb (79.8 kg)   SpO2 98% Comment: RA  BMI 26.76 kg/m  Physical Exam Vitals reviewed.  Constitutional:      Appearance: Normal appearance.  HENT:     Head: Normocephalic and atraumatic.  Cardiovascular:     Rate and Rhythm: Normal rate and regular rhythm.     Heart sounds: Murmur heard.  Pulmonary:     Effort: Pulmonary effort is normal.     Breath sounds: No wheezing or rales.     Comments: Diminished breath sounds left base Abdominal:     General: There is no distension.     Palpations: Abdomen is soft.  Musculoskeletal:     Right lower leg: No edema.     Left lower leg: No edema.  Skin:    General: Skin is warm and dry.  Neurological:     General: No focal deficit present.     Mental Status: He is alert and oriented to person, place, and time.     Cranial Nerves: No cranial nerve deficit.     Motor: No weakness.    Diagnostic Tests: I personally reviewed his chest x-ray images and CT images that are available in our PACS system.  Large left pleural effusion prior to thoracentesis.  Good reexpansion of the lung postthoracentesis.  Recurrent effusion about 3 weeks later.  Most recent film was a left lateral decubitus film from last week that showed a residual layering effusion.  Impression: Chad [name] is a 78 year old man with a past medical history significant for aortic stenosis, CAD, AVR CABG, diabetes, hypertension, hyperlipidemia, polyneuropathy, hypothyroidism, and reflux.  Status post AVR CABG-overall doing well.  Does have a postoperative left pleural effusion.  Postoperative atrial fibrillation-resolved.  Postoperative left pleural effusion-I recommended we repeat an  ultrasound-guided thoracentesis and try a steroid taper.  Spoke by telephone with his daughter who is a Development worker, community.  She had questions about both of those.  I went ahead and put in a prescription for prednisone.  They will decide later whether or not he wants to do that.  I cautioned that it can cause some hyperglycemia and also can cause some people to feel jittery or agitated.  He will call us  know if he wants to proceed with a left thoracentesis  Plan: Prednisone taper  prescribed Patient will call if he wants to schedule ultrasound-guided thoracentesis.  Zelphia Higashi, MD [facility] [phone]

## 2023-12-08 ENCOUNTER — Encounter: Payer: Self-pay | Admitting: Thoracic Surgery (Cardiothoracic Vascular Surgery)

## 2023-12-08 ENCOUNTER — Other Ambulatory Visit: Payer: Self-pay | Admitting: Physician Assistant

## 2023-12-08 ENCOUNTER — Encounter (HOSPITAL_COMMUNITY)
Admission: RE | Admit: 2023-12-08 | Discharge: 2023-12-08 | Disposition: A | Discharge status: Home / Self Care | Source: Ambulatory Visit | Attending: Cardiovascular Disease | Admitting: Cardiovascular Disease

## 2023-12-08 ENCOUNTER — Ambulatory Visit (HOSPITAL_COMMUNITY)
Admission: RE | Admit: 2023-12-08 | Discharge: 2023-12-08 | Disposition: A | Discharge status: Home / Self Care | Source: Ambulatory Visit | Attending: Cardiology | Admitting: Cardiology

## 2023-12-08 DIAGNOSIS — J90 Pleural effusion, not elsewhere classified: Secondary | ICD-10-CM | POA: Insufficient documentation

## 2023-12-08 DIAGNOSIS — Z952 Presence of prosthetic heart valve: Secondary | ICD-10-CM

## 2023-12-08 DIAGNOSIS — Z951 Presence of aortocoronary bypass graft: Secondary | ICD-10-CM

## 2023-12-08 DIAGNOSIS — J9811 Atelectasis: Secondary | ICD-10-CM | POA: Diagnosis not present

## 2023-12-09 ENCOUNTER — Other Ambulatory Visit (HOSPITAL_COMMUNITY)

## 2023-12-09 ENCOUNTER — Ambulatory Visit: Payer: Self-pay | Admitting: Cardiovascular Disease

## 2023-12-10 ENCOUNTER — Encounter (HOSPITAL_COMMUNITY)
Admission: RE | Admit: 2023-12-10 | Discharge: 2023-12-10 | Disposition: A | Discharge status: Home / Self Care | Source: Ambulatory Visit | Attending: Cardiovascular Disease | Admitting: Cardiovascular Disease

## 2023-12-10 DIAGNOSIS — Z951 Presence of aortocoronary bypass graft: Secondary | ICD-10-CM | POA: Diagnosis not present

## 2023-12-10 DIAGNOSIS — Z952 Presence of prosthetic heart valve: Secondary | ICD-10-CM

## 2023-12-13 ENCOUNTER — Encounter (HOSPITAL_COMMUNITY)
Admission: RE | Admit: 2023-12-13 | Discharge: 2023-12-13 | Disposition: A | Discharge status: Home / Self Care | Source: Ambulatory Visit | Attending: Cardiovascular Disease | Admitting: Cardiovascular Disease

## 2023-12-13 DIAGNOSIS — Z951 Presence of aortocoronary bypass graft: Secondary | ICD-10-CM | POA: Diagnosis not present

## 2023-12-13 DIAGNOSIS — Z952 Presence of prosthetic heart valve: Secondary | ICD-10-CM

## 2023-12-14 ENCOUNTER — Ambulatory Visit (INDEPENDENT_AMBULATORY_CARE_PROVIDER_SITE_OTHER): Admitting: Ophthalmology

## 2023-12-14 ENCOUNTER — Encounter (INDEPENDENT_AMBULATORY_CARE_PROVIDER_SITE_OTHER): Payer: Self-pay | Admitting: Ophthalmology

## 2023-12-14 DIAGNOSIS — I10 Essential (primary) hypertension: Secondary | ICD-10-CM

## 2023-12-14 DIAGNOSIS — H35033 Hypertensive retinopathy, bilateral: Secondary | ICD-10-CM | POA: Diagnosis not present

## 2023-12-14 DIAGNOSIS — H25813 Combined forms of age-related cataract, bilateral: Secondary | ICD-10-CM

## 2023-12-14 DIAGNOSIS — H35012 Changes in retinal vascular appearance, left eye: Secondary | ICD-10-CM

## 2023-12-14 DIAGNOSIS — E113313 Type 2 diabetes mellitus with moderate nonproliferative diabetic retinopathy with macular edema, bilateral: Secondary | ICD-10-CM

## 2023-12-14 MED ORDER — BEVACIZUMAB CHEMO INJECTION 1.25MG/0.05ML SYRINGE FOR KALEIDOSCOPE
1.2500 mg | INTRAVITREAL | Status: AC | PRN
  Administered 2023-12-14: 1.25 mg via INTRAVITREAL

## 2023-12-15 ENCOUNTER — Encounter (HOSPITAL_COMMUNITY)
Admission: RE | Admit: 2023-12-15 | Discharge: 2023-12-15 | Disposition: A | Discharge status: Home / Self Care | Source: Ambulatory Visit | Attending: Cardiovascular Disease | Admitting: Cardiovascular Disease

## 2023-12-15 DIAGNOSIS — Z952 Presence of prosthetic heart valve: Secondary | ICD-10-CM

## 2023-12-15 DIAGNOSIS — Z951 Presence of aortocoronary bypass graft: Secondary | ICD-10-CM | POA: Diagnosis not present

## 2023-12-15 NOTE — Progress Notes (Signed)
 Cardiac Individual Treatment Plan  Patient Details  Name: Chad [name] MRN: [id number] Date of Birth: 10-08-1945 Referring Provider:   Flowsheet Row INTENSIVE CARDIAC REHAB ORIENT from 10/21/2023 in [facility]  Referring Provider Dr. Janelle Mediate, MD       Initial Encounter Date:  Flowsheet Row INTENSIVE CARDIAC REHAB ORIENT from 10/21/2023 in [facility]  Date 10/21/23       Visit Diagnosis: S/P CABG x 2 at [facility]  S/P AVR (aortic valve replacement) at [facility]  Patient's Home Medications on Admission:  Current Outpatient Medications:    amLODipine  (NORVASC ) 5 MG tablet, Take 1 tablet (5 mg total) by mouth daily., Disp: 30 tablet, Rfl: 1   aspirin  81 MG chewable tablet, Chew 81 mg by mouth daily., Disp: , Rfl:    B-D ULTRAFINE III SHORT PEN 31G X 8 MM MISC, SMARTSIG:1 Each SUB-Q Daily, Disp: , Rfl:    furosemide  (LASIX ) 20 MG tablet, Take 3 tablets (60 mg total) by mouth daily. (Patient not taking: Reported on 11/29/2023), Disp: 90 tablet, Rfl: 3   gabapentin  (NEURONTIN ) 300 MG capsule, TAKE 2 CAPSULES BY MOUTH TWICE A DAY, Disp: 360 capsule, Rfl: 0   insulin  glargine (LANTUS ) 100 UNIT/ML injection, Inject 14 Units into the skin at bedtime., Disp: , Rfl:    JARDIANCE  10 MG TABS tablet, Take 10 mg by mouth daily., Disp: , Rfl:    levothyroxine  (SYNTHROID ) 100 MCG tablet, Take 1 tablet (100 mcg total) by mouth daily before breakfast., Disp: 30 tablet, Rfl: 0   metoprolol  succinate (TOPROL  XL) 25 MG 24 hr tablet, Take 0.5 tablets (12.5 mg total) by mouth daily., Disp: 45 tablet, Rfl: 1   ONETOUCH VERIO test strip, , Disp: , Rfl:    polyethylene glycol (MIRALAX  / GLYCOLAX ) 17 g packet, Take 17 g by mouth as needed for moderate constipation., Disp: , Rfl:    rosuvastatin  (CRESTOR ) 20 MG tablet, Take 20 mg by mouth daily., Disp: , Rfl:   Past Medical History: Past Medical  History:  Diagnosis Date   Cataract    CKD (chronic kidney disease)    Colon polyps    Diabetes (HCC)    Diabetic polyneuropathy associated with type 2 diabetes mellitus (HCC) 01/15/2017   Diabetic retinopathy (HCC)    GERD (gastroesophageal reflux disease)    Hypercholesterolemia    Hypertension    Hypertensive retinopathy    Hypothyroidism    Polyneuropathy    Proteinuria    Severe aortic stenosis     Tobacco Use: Social History   Tobacco Use  Smoking Status Former  Smokeless Tobacco Never    Labs: Review Flowsheet       Latest Ref Rng & Units 05/21/2023  Labs for ITP Cardiac and Pulmonary Rehab  PH, Arterial 7.35 - 7.45 7.316   PCO2 arterial 32 - 48 mmHg 38.9   Bicarbonate 20.0 - 28.0 mmol/L 20.5  21.0  19.9   TCO2 22 - 32 mmol/L 22  22  21    Acid-base deficit 0.0 - 2.0 mmol/L 6.0  6.0  6.0   O2 Saturation % 61  65  91     Details       Multiple values from one day are sorted in reverse-chronological order         Capillary Blood Glucose: Lab Results  Component Value Date   GLUCAP 168 (H) 11/26/2023   GLUCAP  158 (H) 11/12/2023   GLUCAP 129 (H) 11/05/2023   GLUCAP 160 (H) 11/05/2023   GLUCAP 157 (H) 11/01/2023     Exercise Target Goals: Exercise Program Goal: Individual exercise prescription set using results from initial 6 min walk test and THRR while considering  patient's activity barriers and safety.   Exercise Prescription Goal: Initial exercise prescription builds to 30-45 minutes a day of aerobic activity, 2-3 days per week.  Home exercise guidelines will be given to patient during program as part of exercise prescription that the participant will acknowledge.  Activity Barriers & Risk Stratification:  Activity Barriers & Cardiac Risk Stratification - 10/21/23 1535       Activity Barriers & Cardiac Risk Stratification   Activity Barriers Assistive Device;Deconditioning;Back Problems;Shortness of Breath;Balance Concerns;History of Falls     Cardiac Risk Stratification High             6 Minute Walk:  6 Minute Walk     Row Name 10/21/23 1533         6 Minute Walk   Phase Initial  Nustep test     Distance 1279 feet     Walk Time 6 minutes     # of Rest Breaks 0     MPH 2.42     METS 2.09     RPE 9     Perceived Dyspnea  1     VO2 Peak 7.31     Symptoms Yes (comment)     Comments No symptoms: 0.39km, 454 total steps, 75Avg spm, 1.6MET, 16 AVG Watts     Resting HR 51 bpm     Resting BP 118/62     Resting Oxygen  Saturation  95 %     Exercise Oxygen  Saturation  during 6 min walk 98 %     Max Ex. HR 53 bpm     Max Ex. BP 138/72     2 Minute Post BP 130/60              Oxygen  Initial Assessment:   Oxygen  Re-Evaluation:   Oxygen  Discharge (Final Oxygen  Re-Evaluation):   Initial Exercise Prescription:  Initial Exercise Prescription - 10/21/23 1300       Date of Initial Exercise RX and Referring Provider   Date 10/21/23    Referring Provider Dr. Janelle Mediate, MD    Expected Discharge Date 01/12/24      NuStep   Level 1    SPM 60    Minutes 20    METs 1.5      Prescription Details   Frequency (times per week) 3    Duration Progress to 30 minutes of continuous aerobic without signs/symptoms of physical distress      Intensity   THRR 40-80% of Max Heartrate 58-115    Ratings of Perceived Exertion 11-13    Perceived Dyspnea 0-4      Progression   Progression Continue progressive overload as per policy without signs/symptoms or physical distress.      Resistance Training   Training Prescription Yes    Weight 3    Reps 10-15             Perform Capillary Blood Glucose checks as needed.  Exercise Prescription Changes:   Exercise Prescription Changes     Row Name 10/27/23 1623 11/12/23 1625 12/10/23 1658         Response to Exercise   Blood Pressure (Admit) 122/60 122/70 136/60     Blood Pressure (Exercise) 142/60 144/76 --  Blood Pressure (Exit) 108/60 128/60  132/64     Heart Rate (Admit) 53 bpm 54 bpm 58 bpm     Heart Rate (Exercise) 61 bpm 93 bpm 94 bpm     Heart Rate (Exit) 55 bpm 51 bpm 67 bpm     Rating of Perceived Exertion (Exercise) 11 11 11      Perceived Dyspnea (Exercise) 0 0 0     Symptoms 0 0 0     Comments Pt first day in the [facility] I[facility] program Reviewed MET's, goals and home ExRx Reviewed MET's and goals     Duration Progress to 30 minutes of  aerobic without signs/symptoms of physical distress Progress to 30 minutes of  aerobic without signs/symptoms of physical distress Progress to 30 minutes of  aerobic without signs/symptoms of physical distress     Intensity THRR unchanged THRR unchanged THRR unchanged       Progression   Progression Continue to progress workloads to maintain intensity without signs/symptoms of physical distress. Continue to progress workloads to maintain intensity without signs/symptoms of physical distress. Continue to progress workloads to maintain intensity without signs/symptoms of physical distress.     Average METs 1.8 2.1 2.35       Resistance Training   Training Prescription No Yes Yes     Weight 3 3 3      Reps 10-15 10-15 10-15     Time 10 Minutes 10 Minutes 10 Minutes       Recumbant Bike   Level -- -- 2     RPM -- -- 74     Watts -- -- 24     Minutes -- -- 15     METs -- -- 2.4       NuStep   Level 1 2 2      SPM 73 83 90     Minutes 20 33 33     METs 1.8 2.1 2.3       Home Exercise Plan   Plans to continue exercise at -- Home (comment) Home (comment)     Frequency -- Add 2 additional days to program exercise sessions. Add 2 additional days to program exercise sessions.     Initial Home Exercises Provided -- 11/12/23 11/12/23              Exercise Comments:   Exercise Comments     Row Name 10/27/23 1628 11/12/23 1629 12/10/23 1704       Exercise Comments Pt first day in the [facility] I[facility] program. Pt tolerated exercise well with an average MET level of 1.8. Pt is learning  his THRR, RPE and ExRx Reviewed MET's, goals and home ExRx. Pt tolerated exercise well with an average MET level of 2.1. Pt is feeling good about his goals and is increasing strength and stamina. He's very happy that hes getting stronger to go on a big family trip to [facility] in Aug. He will add in exercise on his own by walking and using the stepper at [facility] 1-2 days for about 30 mins a session Reviewed MET's and goals. Pt tolerated exercise well with an average MET level of 2.35. He is feeling good about his goals and feels and increase in stamina, he has moved to two modalities to help him get more leg strength for going to [place] with his family in Aug. He's doing great and increasing MET's              Exercise Goals and Review:  Exercise Goals     Row Name 10/21/23 1316             Exercise Goals   Increase Physical Activity Yes       Intervention Provide advice, education, support and counseling about physical activity/exercise needs.;Develop an individualized exercise prescription for aerobic and resistive training based on initial evaluation findings, risk stratification, comorbidities and participant's personal goals.       Expected Outcomes Short Term: Attend rehab on a regular basis to increase amount of physical activity.;Long Term: Exercising regularly at least 3-5 days a week.;Long Term: Add in home exercise to make exercise part of routine and to increase amount of physical activity.       Increase Strength and Stamina Yes       Intervention Provide advice, education, support and counseling about physical activity/exercise needs.;Develop an individualized exercise prescription for aerobic and resistive training based on initial evaluation findings, risk stratification, comorbidities and participant's personal goals.       Expected Outcomes Short Term: Increase workloads from initial exercise prescription for resistance, speed, and METs.;Short Term: Perform resistance  training exercises routinely during rehab and add in resistance training at home;Long Term: Improve cardiorespiratory fitness, muscular endurance and strength as measured by increased METs and functional capacity ( )       Able to understand and use rate of perceived exertion (RPE) scale Yes       Intervention Provide education and explanation on how to use RPE scale       Expected Outcomes Short Term: Able to use RPE daily in rehab to express subjective intensity level;Long Term:  Able to use RPE to guide intensity level when exercising independently       Knowledge and understanding of Target Heart Rate Range (THRR) Yes       Intervention Provide education and explanation of THRR including how the numbers were predicted and where they are located for reference       Expected Outcomes Short Term: Able to state/look up THRR;Long Term: Able to use THRR to govern intensity when exercising independently;Short Term: Able to use daily as guideline for intensity in rehab       Understanding of Exercise Prescription Yes       Intervention Provide education, explanation, and written materials on patient's individual exercise prescription       Expected Outcomes Short Term: Able to explain program exercise prescription;Long Term: Able to explain home exercise prescription to exercise independently                Exercise Goals Re-Evaluation :  Exercise Goals Re-Evaluation     Row Name 10/27/23 1626 11/12/23 1627 12/10/23 1701         Exercise Goal Re-Evaluation   Exercise Goals Review Increase Physical Activity;Understanding of Exercise Prescription;Increase Strength and Stamina;Knowledge and understanding of Target Heart Rate Range (THRR);Able to understand and use rate of perceived exertion (RPE) scale Increase Physical Activity;Understanding of Exercise Prescription;Increase Strength and Stamina;Knowledge and understanding of Target Heart Rate Range (THRR);Able to understand and use rate of  perceived exertion (RPE) scale Increase Physical Activity;Understanding of Exercise Prescription;Increase Strength and Stamina;Knowledge and understanding of Target Heart Rate Range (THRR);Able to understand and use rate of perceived exertion (RPE) scale     Comments Pt first day in the [facility] ICR program. Pt tolerated exercise well with an average MET level of 1.8. Pt is learning his THRR, RPE and ExRx Reviewed MET's, goals and home ExRx. Pt tolerated exercise well with  an average MET level of 2.1. Pt is feeling good about his goals and is increasing strength and stamina. He's very happy that hes getting stronger to go on a big family trip to [facility] in Aug. He will add in exercise on his own by walking and using the stepper at [facility] 1-2 days for about 30 mins a session Reviewed MET's and goals. Pt tolerated exercise well with an average MET level of 2.35. He is feeling good about his goals and feels and increase in stamina, he has moved to two modalities to help him get more leg strength for going to [place] with his family in Aug. He's doing great and increasing MET's     Expected Outcomes Will continue to monitor pt and progress workloads as tolerated without sign or symptoms Will continue to monitor pt and progress workloads as tolerated without sign or symptoms Will continue to monitor pt and progress workloads as tolerated without sign or symptoms              Discharge Exercise Prescription (Final Exercise Prescription Changes):  Exercise Prescription Changes - 12/10/23 1658       Response to Exercise   Blood Pressure (Admit) 136/60    Blood Pressure (Exit) 132/64    Heart Rate (Admit) 58 bpm    Heart Rate (Exercise) 94 bpm    Heart Rate (Exit) 67 bpm    Rating of Perceived Exertion (Exercise) 11    Perceived Dyspnea (Exercise) 0    Symptoms 0    Comments Reviewed MET's and goals    Duration Progress to 30 minutes of  aerobic without signs/symptoms of physical distress     Intensity THRR unchanged      Progression   Progression Continue to progress workloads to maintain intensity without signs/symptoms of physical distress.    Average METs 2.35      Resistance Training   Training Prescription Yes    Weight 3    Reps 10-15    Time 10 Minutes      Recumbant Bike   Level 2    RPM 74    Watts 24    Minutes 15    METs 2.4      NuStep   Level 2    SPM 90    Minutes 33    METs 2.3      Home Exercise Plan   Plans to continue exercise at Home (comment)    Frequency Add 2 additional days to program exercise sessions.    Initial Home Exercises Provided 11/12/23             Nutrition:  Target Goals: Understanding of nutrition guidelines, daily intake of sodium 1500mg , cholesterol 200mg , calories 30% from fat and 7% or less from saturated fats, daily to have 5 or more servings of fruits and vegetables.  Biometrics:  Pre Biometrics - 10/21/23 1309       Pre Biometrics   Waist Circumference 40 inches    Hip Circumference 43 inches    Waist to Hip Ratio 0.93 %    Triceps Skinfold 7 mm    % Body Fat 24.9 %    Grip Strength 26 kg    Flexibility --   unable to reach   Single Leg Stand --   Assist device. No attempt             Nutrition Therapy Plan and Nutrition Goals:  Nutrition Therapy & Goals - 11/26/23 1552  Nutrition Therapy   Diet Heart Healthy Diet    Drug/Food Interactions Statins/Certain Fruits      Personal Nutrition Goals   Nutrition Goal Patient to identify strategies for reducing cardiovascular risk by attending the [facility] education and nutrition series weekly.   goal in progress.   Personal Goal #2 Patient to improve diet quality by using the plate method as a guide for meal planning to include lean protein/plant protein, fruits, vegetables, whole grains, nonfat dairy as part of a well-balanced diet.   goal in progress.   Comments Goals in progress. Akin has medical history of CAD s/p CABG x2, AVR, HTN,  hyperlipidemia, stage IV kidney disease,DM2.  A1c is well controlled. He is down 6.6# since starting with our program. He continues to attend the [facility] education/nutrition series regularly. He continues regular follow-up with nephrology. His wife is a good support.   Patient will benefit from participation in intensive cardiac rehab for nutrition, exercise, and lifestyle modification.      Intervention Plan   Intervention Prescribe, educate and counsel regarding individualized specific dietary modifications aiming towards targeted core components such as weight, hypertension, lipid management, diabetes, heart failure and other comorbidities.;Nutrition handout(s) given to patient.    Expected Outcomes Short Term Goal: Understand basic principles of dietary content, such as calories, fat, sodium, cholesterol and nutrients.;Long Term Goal: Adherence to prescribed nutrition plan.             Nutrition Assessments:  MEDIFICTS Score Key: >=70 Need to make dietary changes  40-70 Heart Healthy Diet <= 40 Therapeutic Level Cholesterol Diet    Picture Your Plate Scores: <14 Unhealthy dietary pattern with much room for improvement. 41-50 Dietary pattern unlikely to meet recommendations for good health and room for improvement. 51-60 More healthful dietary pattern, with some room for improvement.  >60 Healthy dietary pattern, although there may be some specific behaviors that could be improved.    Nutrition Goals Re-Evaluation:  Nutrition Goals Re-Evaluation     Row Name 10/27/23 1543 11/26/23 1552           Goals   Current Weight 184 lb 11.9 oz (83.8 kg) 178 lb 2.1 oz (80.8 kg)      Comment Cr 2.235, GFR 17, A1c 6.0, unable to review most recent lipid panel. no new labs; most recent labs  Cr 2.235, GFR 17, A1c 6.0, unable to review most recent lipid panel.      Expected Outcome Alejos has medical history of CAD s/p CABG x2, AVR, HTN, hyperlipidemia, stage IV kidney disease,DM2. A1c is  well controlled. He continues regular follow-up with nephrology. His wife is a good support. Patient will benefit from participation in intensive cardiac rehab for nutrition, exercise, and lifestyle modification. Goals in progress. Jevante has medical history of CAD s/p CABG x2, AVR, HTN, hyperlipidemia, stage IV kidney disease,DM2. A1c is well controlled. He is down 6.6# since starting with our program. He continues to attend the [facility] education/nutrition series regularly. He continues regular follow-up with nephrology. His wife is a good support. Patient will benefit from participation in intensive cardiac rehab for nutrition, exercise, and lifestyle modification.               Nutrition Goals Re-Evaluation:  Nutrition Goals Re-Evaluation     Row Name 10/27/23 1543 11/26/23 1552           Goals   Current Weight 184 lb 11.9 oz (83.8 kg) 178 lb 2.1 oz (80.8 kg)      Comment  Cr 2.235, GFR 17, A1c 6.0, unable to review most recent lipid panel. no new labs; most recent labs  Cr 2.235, GFR 17, A1c 6.0, unable to review most recent lipid panel.      Expected Outcome Bernon has medical history of CAD s/p CABG x2, AVR, HTN, hyperlipidemia, stage IV kidney disease,DM2. A1c is well controlled. He continues regular follow-up with nephrology. His wife is a good support. Patient will benefit from participation in intensive cardiac rehab for nutrition, exercise, and lifestyle modification. Goals in progress. Saleem has medical history of CAD s/p CABG x2, AVR, HTN, hyperlipidemia, stage IV kidney disease,DM2. A1c is well controlled. He is down 6.6# since starting with our program. He continues to attend the [facility] education/nutrition series regularly. He continues regular follow-up with nephrology. His wife is a good support. Patient will benefit from participation in intensive cardiac rehab for nutrition, exercise, and lifestyle modification.               Nutrition Goals Discharge (Final Nutrition  Goals Re-Evaluation):  Nutrition Goals Re-Evaluation - 11/26/23 1552       Goals   Current Weight 178 lb 2.1 oz (80.8 kg)    Comment no new labs; most recent labs  Cr 2.235, GFR 17, A1c 6.0, unable to review most recent lipid panel.    Expected Outcome Goals in progress. Jeffree has medical history of CAD s/p CABG x2, AVR, HTN, hyperlipidemia, stage IV kidney disease,DM2. A1c is well controlled. He is down 6.6# since starting with our program. He continues to attend the [facility] education/nutrition series regularly. He continues regular follow-up with nephrology. His wife is a good support. Patient will benefit from participation in intensive cardiac rehab for nutrition, exercise, and lifestyle modification.             Psychosocial: Target Goals: Acknowledge presence or absence of significant depression and/or stress, maximize coping skills, provide positive support system. Participant is able to verbalize types and ability to use techniques and skills needed for reducing stress and depression.  Initial Review & Psychosocial Screening:  Initial Psych Review & Screening - 10/21/23 1356       Initial Review   Current issues with None Identified      Family Dynamics   Good Support System? Yes   Cortland has his wife two children and grandchildren for support     Barriers   Psychosocial barriers to participate in program There are no identifiable barriers or psychosocial needs.      Screening Interventions   Interventions Encouraged to exercise             Quality of Life Scores:  Quality of Life - 10/21/23 1538       Quality of Life   Select Quality of Life      Quality of Life Scores   Health/Function Pre 26.8 %    Socioeconomic Pre 28.33 %    Psych/Spiritual Pre 26.93 %    Family Pre 28.8 %    GLOBAL Pre 27.41 %            Scores of 19 and below usually indicate a poorer quality of life in these areas.  A difference of  2-3 points is a clinically meaningful  difference.  A difference of 2-3 points in the total score of the Quality of Life Index has been associated with significant improvement in overall quality of life, self-image, physical symptoms, and general health in studies assessing change in quality of life.  PHQ-9:  Review Flowsheet       10/21/2023  Depression screen PHQ 2/9  Decreased Interest 0  Down, Depressed, Hopeless 0  PHQ - 2 Score 0  Altered sleeping 0  Tired, decreased energy 0  Change in appetite 0  Feeling bad or failure about yourself  0  Trouble concentrating 0  Moving slowly or fidgety/restless 0  Suicidal thoughts 0  PHQ-9 Score 0   Interpretation of Total Score  Total Score Depression Severity:  1-4 = Minimal depression, 5-9 = Mild depression, 10-14 = Moderate depression, 15-19 = Moderately severe depression, 20-27 = Severe depression   Psychosocial Evaluation and Intervention:   Psychosocial Re-Evaluation:  Psychosocial Re-Evaluation     Row Name 10/28/23 1138 11/16/23 1720 12/15/23 0753         Psychosocial Re-Evaluation   Current issues with None Identified None Identified None Identified     Interventions Encouraged to attend Cardiac Rehabilitation for the exercise Encouraged to attend Cardiac Rehabilitation for the exercise Encouraged to attend Cardiac Rehabilitation for the exercise     Continue Psychosocial Services  No Follow up required No Follow up required No Follow up required              Psychosocial Discharge (Final Psychosocial Re-Evaluation):  Psychosocial Re-Evaluation - 12/15/23 0753       Psychosocial Re-Evaluation   Current issues with None Identified    Interventions Encouraged to attend Cardiac Rehabilitation for the exercise    Continue Psychosocial Services  No Follow up required             Vocational Rehabilitation: Provide vocational rehab assistance to qualifying candidates.   Vocational Rehab Evaluation & Intervention:  Vocational Rehab - 10/21/23 1357        Initial Vocational Rehab Evaluation & Intervention   Assessment shows need for Vocational Rehabilitation No   Rayhaan is retired and does not need vocational rehab at this time            Education: Education Goals: Education classes will be provided on a weekly basis, covering required topics. Participant will state understanding/return demonstration of topics presented.    Education     Row Name 10/27/23 1400     Education   Cardiac Education Topics [facility]   Customer service manager   Weekly Topic Fast Evening Meals   Instruction Review Code 1- Verbalizes Understanding   Class Start Time 1400   Class Stop Time 1440   Class Time Calculation (min) 40 min    Row Name 10/29/23 1400     Education   Cardiac Education Topics [facility]   Licensed conveyancer Nutrition   Nutrition Vitamins and Minerals   Instruction Review Code 1- Verbalizes Understanding   Class Start Time 1400   Class Stop Time 1440   Class Time Calculation (min) 40 min    Row Name 11/01/23 1500     Education   Cardiac Education Topics [facility]   Glass blower/designer Nutrition   Nutrition Workshop Fueling a Forensic psychologist   Instruction Review Code 1- Tax inspector   Class Start Time 1400   Class Stop Time 1440   Class Time Calculation (min) 40 min    Row Name 11/05/23 1400     Education   Cardiac Education Topics Pritikin   Select  Core Videos     Core Videos   Educator Exercise Physiologist   Select Exercise Education   Exercise Education Improving Performance   Instruction Review Code 1- Verbalizes Understanding   Class Start Time 1400   Class Stop Time 1435   Class Time Calculation (min) 35 min    Row Name 11/08/23 1700     Education   Cardiac Education Topics Pritikin   Select Workshops     Workshops   Educator Exercise Physiologist    Select Psychosocial   Psychosocial Workshop Healthy Sleep for a Healthy Heart   Instruction Review Code 1- Verbalizes Understanding   Class Start Time 1403   Class Stop Time 1457   Class Time Calculation (min) 54 min    Row Name 11/12/23 1500     Education   Cardiac Education Topics Pritikin   Nurse, children's Exercise Physiologist   Select Psychosocial   Psychosocial How Our Thoughts Can Heal Our Hearts   Instruction Review Code 1- Verbalizes Understanding   Class Start Time 1400   Class Stop Time 1440   Class Time Calculation (min) 40 min    Row Name 11/15/23 1500     Education   Cardiac Education Topics Pritikin   Select Workshops     Workshops   Educator Exercise Physiologist   Select Exercise   Exercise Workshop Managing Heart Disease: Your Path to a Healthier Heart   Instruction Review Code 1- Verbalizes Understanding   Class Start Time 1400   Class Stop Time 1448   Class Time Calculation (min) 48 min    Row Name 11/17/23 1500     Education   Cardiac Education Topics Pritikin   Customer service manager   Weekly Topic Powerhouse Plant-Based Proteins   Instruction Review Code 1- Verbalizes Understanding   Class Start Time 1400   Class Stop Time 1435   Class Time Calculation (min) 35 min    Row Name 11/19/23 1500     Education   Cardiac Education Topics Pritikin   Hospital doctor Education   General Education Hypertension and Heart Disease   Instruction Review Code 1- Verbalizes Understanding   Class Start Time 1350   Class Stop Time 1433   Class Time Calculation (min) 43 min    Row Name 11/22/23 1600     Education   Cardiac Education Topics Pritikin   Geographical information systems officer Psychosocial   Psychosocial Workshop Focused Goals, Sustainable Changes    Instruction Review Code 1- Verbalizes Understanding   Class Start Time 1400   Class Stop Time 1445   Class Time Calculation (min) 45 min    Row Name 11/24/23 1600     Education   Cardiac Education Topics Pritikin   Customer service manager   Weekly Topic Tasty Appetizers and Snacks   Instruction Review Code 1- Verbalizes Understanding   Class Start Time 1400   Class Stop Time 1445   Class Time Calculation (min) 45 min    Row Name 11/26/23 1400     Education   Cardiac Education Topics Pritikin   Nurse, children's Exercise Physiologist  Select General Education   General Education Heart Disease Risk Reduction   Instruction Review Code 1- Verbalizes Understanding   Class Start Time 1400   Class Stop Time 1445   Class Time Calculation (min) 45 min    Row Name 12/01/23 1500     Education   Cardiac Education Topics Pritikin   Customer service manager   Weekly Topic Adding Flavor - Sodium-Free   Instruction Review Code 1- Verbalizes Understanding   Class Start Time 1400   Class Stop Time 1440   Class Time Calculation (min) 40 min    Row Name 12/03/23 1500     Education   Cardiac Education Topics Pritikin   Select Workshops     Workshops   Educator Exercise Physiologist   Select Exercise   Exercise Workshop Location manager and Fall Prevention   Instruction Review Code 1- Verbalizes Understanding   Class Start Time 1407   Class Stop Time 1447   Class Time Calculation (min) 40 min    Row Name 12/06/23 1500     Education   Cardiac Education Topics Pritikin   Glass blower/designer Nutrition   Nutrition Workshop Label Reading   Instruction Review Code 1- Tax inspector   Class Start Time 1400   Class Stop Time 1437   Class Time Calculation (min) 37 min    Row Name 12/08/23 1400     Education    Cardiac Education Topics Pritikin   Nurse, children's Exercise Physiologist   Select Psychosocial   Nutrition Other  Label reading   Instruction Review Code 1- Verbalizes Understanding   Class Start Time 1350   Class Stop Time 1435   Class Time Calculation (min) 45 min    Row Name 12/10/23 1500     Education   Cardiac Education Topics Pritikin   Customer service manager   Weekly Topic Fast and Healthy Breakfasts   Instruction Review Code 1- Verbalizes Understanding   Class Start Time 1400   Class Stop Time 1440   Class Time Calculation (min) 40 min    Row Name 12/13/23 1400     Education   Cardiac Education Topics Pritikin   Hospital doctor Education   General Education Metabolic Syndrome and Belly Fat   Instruction Review Code 1- Verbalizes Understanding   Class Start Time 1355   Class Stop Time 1440   Class Time Calculation (min) 45 min            Core Videos: Exercise    Move It!  Clinical staff conducted group or individual video education with verbal and written material and guidebook.  Patient learns the recommended Pritikin exercise program. Exercise with the goal of living a long, healthy life. Some of the health benefits of exercise include controlled diabetes, healthier blood pressure levels, improved cholesterol levels, improved heart and lung capacity, improved sleep, and better body composition. Everyone should speak with their doctor before starting or changing an exercise routine.  Biomechanical Limitations Clinical staff conducted group or individual video education with verbal and written material and guidebook.  Patient learns how biomechanical limitations can impact exercise and how we can mitigate and possibly overcome limitations to  have an impactful and balanced exercise routine.  Body Composition Clinical  staff conducted group or individual video education with verbal and written material and guidebook.  Patient learns that body composition (ratio of muscle mass to fat mass) is a key component to assessing overall fitness, rather than body weight alone. Increased fat mass, especially visceral belly fat, can put us  at increased risk for metabolic syndrome, type 2 diabetes, heart disease, and even death. It is recommended to combine diet and exercise (cardiovascular and resistance training) to improve your body composition. Seek guidance from your physician and exercise physiologist before implementing an exercise routine.  Exercise Action Plan Clinical staff conducted group or individual video education with verbal and written material and guidebook.  Patient learns the recommended strategies to achieve and enjoy long-term exercise adherence, including variety, self-motivation, self-efficacy, and positive decision making. Benefits of exercise include fitness, good health, weight management, more energy, better sleep, less stress, and overall well-being.  Medical   Heart Disease Risk Reduction Clinical staff conducted group or individual video education with verbal and written material and guidebook.  Patient learns our heart is our most vital organ as it circulates oxygen , nutrients, white blood cells, and hormones throughout the entire body, and carries waste away. Data supports a plant-based eating plan like the Pritikin Program for its effectiveness in slowing progression of and reversing heart disease. The video provides a number of recommendations to address heart disease.   Metabolic Syndrome and Belly Fat  Clinical staff conducted group or individual video education with verbal and written material and guidebook.  Patient learns what metabolic syndrome is, how it leads to heart disease, and how one can reverse it and keep it from coming back. You have metabolic syndrome if you have 3 of the  following 5 criteria: abdominal obesity, high blood pressure, high triglycerides, low HDL cholesterol, and high blood sugar.  Hypertension and Heart Disease Clinical staff conducted group or individual video education with verbal and written material and guidebook.  Patient learns that high blood pressure, or hypertension, is very common in the [place] . Hypertension is largely due to excessive salt intake, but other important risk factors include being overweight, physical inactivity, drinking too much alcohol, smoking, and not eating enough potassium from fruits and vegetables. High blood pressure is a leading risk factor for heart attack, stroke, congestive heart failure, dementia, kidney failure, and premature death. Long-term effects of excessive salt intake include stiffening of the arteries and thickening of heart muscle and organ damage. Recommendations include ways to reduce hypertension and the risk of heart disease.  Diseases of Our Time - Focusing on Diabetes Clinical staff conducted group or individual video education with verbal and written material and guidebook.  Patient learns why the best way to stop diseases of our time is prevention, through food and other lifestyle changes. Medicine (such as prescription pills and surgeries) is often only a Band-Aid on the problem, not a long-term solution. Most common diseases of our time include obesity, type 2 diabetes, hypertension, heart disease, and cancer. The Pritikin Program is recommended and has been proven to help reduce, reverse, and/or prevent the damaging effects of metabolic syndrome.  Nutrition   Overview of the Pritikin Eating Plan  Clinical staff conducted group or individual video education with verbal and written material and guidebook.  Patient learns about the Pritikin Eating Plan for disease risk reduction. The Pritikin Eating Plan emphasizes a wide variety of unrefined, minimally-processed carbohydrates, like fruits,  vegetables, whole grains,  and legumes. Go, Caution, and Stop food choices are explained. Plant-based and lean animal proteins are emphasized. Rationale provided for low sodium intake for blood pressure control, low added sugars for blood sugar stabilization, and low added fats and oils for coronary artery disease risk reduction and weight management.  Calorie Density  Clinical staff conducted group or individual video education with verbal and written material and guidebook.  Patient learns about calorie density and how it impacts the Pritikin Eating Plan. Knowing the characteristics of the food you choose will help you decide whether those foods will lead to weight gain or weight loss, and whether you want to consume more or less of them. Weight loss is usually a side effect of the Pritikin Eating Plan because of its focus on low calorie-dense foods.  Label Reading  Clinical staff conducted group or individual video education with verbal and written material and guidebook.  Patient learns about the Pritikin recommended label reading guidelines and corresponding recommendations regarding calorie density, added sugars, sodium content, and whole grains.  Dining Out - Part 1  Clinical staff conducted group or individual video education with verbal and written material and guidebook.  Patient learns that restaurant meals can be sabotaging because they can be so high in calories, fat, sodium, and/or sugar. Patient learns recommended strategies on how to positively address this and avoid unhealthy pitfalls.  Facts on Fats  Clinical staff conducted group or individual video education with verbal and written material and guidebook.  Patient learns that lifestyle modifications can be just as effective, if not more so, as many medications for lowering your risk of heart disease. A Pritikin lifestyle can help to reduce your risk of inflammation and atherosclerosis (cholesterol build-up, or plaque, in the artery  walls). Lifestyle interventions such as dietary choices and physical activity address the cause of atherosclerosis. A review of the types of fats and their impact on blood cholesterol levels, along with dietary recommendations to reduce fat intake is also included.  Nutrition Action Plan  Clinical staff conducted group or individual video education with verbal and written material and guidebook.  Patient learns how to incorporate Pritikin recommendations into their lifestyle. Recommendations include planning and keeping personal health goals in mind as an important part of their success.  Healthy Mind-Set    Healthy Minds, Bodies, Hearts  Clinical staff conducted group or individual video education with verbal and written material and guidebook.  Patient learns how to identify when they are stressed. Video will discuss the impact of that stress, as well as the many benefits of stress management. Patient will also be introduced to stress management techniques. The way we think, act, and feel has an impact on our hearts.  How Our Thoughts Can Heal Our Hearts  Clinical staff conducted group or individual video education with verbal and written material and guidebook.  Patient learns that negative thoughts can cause depression and anxiety. This can result in negative lifestyle behavior and serious health problems. Cognitive behavioral therapy is an effective method to help control our thoughts in order to change and improve our emotional outlook.  Additional Videos:  Exercise    Improving Performance  Clinical staff conducted group or individual video education with verbal and written material and guidebook.  Patient learns to use a non-linear approach by alternating intensity levels and lengths of time spent exercising to help burn more calories and lose more body fat. Cardiovascular exercise helps improve heart health, metabolism, hormonal balance, blood sugar control, and recovery from  fatigue.  Resistance training improves strength, endurance, balance, coordination, reaction time, metabolism, and muscle mass. Flexibility exercise improves circulation, posture, and balance. Seek guidance from your physician and exercise physiologist before implementing an exercise routine and learn your capabilities and proper form for all exercise.  Introduction to Yoga  Clinical staff conducted group or individual video education with verbal and written material and guidebook.  Patient learns about yoga, a discipline of the coming together of mind, breath, and body. The benefits of yoga include improved flexibility, improved range of motion, better posture and core strength, increased lung function, weight loss, and positive self-image. Yoga's heart health benefits include lowered blood pressure, healthier heart rate, decreased cholesterol and triglyceride levels, improved immune function, and reduced stress. Seek guidance from your physician and exercise physiologist before implementing an exercise routine and learn your capabilities and proper form for all exercise.  Medical   Aging: Enhancing Your Quality of Life  Clinical staff conducted group or individual video education with verbal and written material and guidebook.  Patient learns key strategies and recommendations to stay in good physical health and enhance quality of life, such as prevention strategies, having an advocate, securing a Health Care Proxy and Power of Attorney, and keeping a list of medications and system for tracking them. It also discusses how to avoid risk for bone loss.  Biology of Weight Control  Clinical staff conducted group or individual video education with verbal and written material and guidebook.  Patient learns that weight gain occurs because we consume more calories than we burn (eating more, moving less). Even if your body weight is normal, you may have higher ratios of fat compared to muscle mass. Too much body fat puts  you at increased risk for cardiovascular disease, heart attack, stroke, type 2 diabetes, and obesity-related cancers. In addition to exercise, following the Pritikin Eating Plan can help reduce your risk.  Decoding Lab Results  Clinical staff conducted group or individual video education with verbal and written material and guidebook.  Patient learns that lab test reflects one measurement whose values change over time and are influenced by many factors, including medication, stress, sleep, exercise, food, hydration, pre-existing medical conditions, and more. It is recommended to use the knowledge from this video to become more involved with your lab results and evaluate your numbers to speak with your doctor.   Diseases of Our Time - Overview  Clinical staff conducted group or individual video education with verbal and written material and guidebook.  Patient learns that according to the CDC, 50% to 70% of chronic diseases (such as obesity, type 2 diabetes, elevated lipids, hypertension, and heart disease) are avoidable through lifestyle improvements including healthier food choices, listening to satiety cues, and increased physical activity.  Sleep Disorders Clinical staff conducted group or individual video education with verbal and written material and guidebook.  Patient learns how good quality and duration of sleep are important to overall health and well-being. Patient also learns about sleep disorders and how they impact health along with recommendations to address them, including discussing with a physician.  Nutrition  Dining Out - Part 2 Clinical staff conducted group or individual video education with verbal and written material and guidebook.  Patient learns how to plan ahead and communicate in order to maximize their dining experience in a healthy and nutritious manner. Included are recommended food choices based on the type of restaurant the patient is visiting.   Fueling a Publishing rights manager conducted group  or individual video education with verbal and written material and guidebook.  There is a strong connection between our food choices and our health. Diseases like obesity and type 2 diabetes are very prevalent and are in large-part due to lifestyle choices. The Pritikin Eating Plan provides plenty of food and hunger-curbing satisfaction. It is easy to follow, affordable, and helps reduce health risks.  Menu Workshop  Clinical staff conducted group or individual video education with verbal and written material and guidebook.  Patient learns that restaurant meals can sabotage health goals because they are often packed with calories, fat, sodium, and sugar. Recommendations include strategies to plan ahead and to communicate with the manager, chef, or server to help order a healthier meal.  Planning Your Eating Strategy  Clinical staff conducted group or individual video education with verbal and written material and guidebook.  Patient learns about the Pritikin Eating Plan and its benefit of reducing the risk of disease. The Pritikin Eating Plan does not focus on calories. Instead, it emphasizes high-quality, nutrient-rich foods. By knowing the characteristics of the foods, we choose, we can determine their calorie density and make informed decisions.  Targeting Your Nutrition Priorities  Clinical staff conducted group or individual video education with verbal and written material and guidebook.  Patient learns that lifestyle habits have a tremendous impact on disease risk and progression. This video provides eating and physical activity recommendations based on your personal health goals, such as reducing LDL cholesterol, losing weight, preventing or controlling type 2 diabetes, and reducing high blood pressure.  Vitamins and Minerals  Clinical staff conducted group or individual video education with verbal and written material and guidebook.  Patient learns  different ways to obtain key vitamins and minerals, including through a recommended healthy diet. It is important to discuss all supplements you take with your doctor.   Healthy Mind-Set    Smoking Cessation  Clinical staff conducted group or individual video education with verbal and written material and guidebook.  Patient learns that cigarette smoking and tobacco addiction pose a serious health risk which affects millions of people. Stopping smoking will significantly reduce the risk of heart disease, lung disease, and many forms of cancer. Recommended strategies for quitting are covered, including working with your doctor to develop a successful plan.  Culinary   Becoming a Set designer conducted group or individual video education with verbal and written material and guidebook.  Patient learns that cooking at home can be healthy, cost-effective, quick, and puts them in control. Keys to cooking healthy recipes will include looking at your recipe, assessing your equipment needs, planning ahead, making it simple, choosing cost-effective seasonal ingredients, and limiting the use of added fats, salts, and sugars.  Cooking - Breakfast and Snacks  Clinical staff conducted group or individual video education with verbal and written material and guidebook.  Patient learns how important breakfast is to satiety and nutrition through the entire day. Recommendations include key foods to eat during breakfast to help stabilize blood sugar levels and to prevent overeating at meals later in the day. Planning ahead is also a key component.  Cooking - Educational psychologist conducted group or individual video education with verbal and written material and guidebook.  Patient learns eating strategies to improve overall health, including an approach to cook more at home. Recommendations include thinking of animal protein as a side on your plate rather than center stage and focusing  instead on lower calorie dense options like  vegetables, fruits, whole grains, and plant-based proteins, such as beans. Making sauces in large quantities to freeze for later and leaving the skin on your vegetables are also recommended to maximize your experience.  Cooking - Healthy Salads and Dressing Clinical staff conducted group or individual video education with verbal and written material and guidebook.  Patient learns that vegetables, fruits, whole grains, and legumes are the foundations of the Pritikin Eating Plan. Recommendations include how to incorporate each of these in flavorful and healthy salads, and how to create homemade salad dressings. Proper handling of ingredients is also covered. Cooking - Soups and [employer] - Soups and Desserts Clinical staff conducted group or individual video education with verbal and written material and guidebook.  Patient learns that Pritikin soups and desserts make for easy, nutritious, and delicious snacks and meal components that are low in sodium, fat, sugar, and calorie density, while high in vitamins, minerals, and filling fiber. Recommendations include simple and healthy ideas for soups and desserts.   Overview     The Pritikin Solution Program Overview Clinical staff conducted group or individual video education with verbal and written material and guidebook.  Patient learns that the results of the Pritikin Program have been documented in more than 100 articles published in peer-reviewed journals, and the benefits include reducing risk factors for (and, in some cases, even reversing) high cholesterol, high blood pressure, type 2 diabetes, obesity, and more! An overview of the three key pillars of the Pritikin Program will be covered: eating well, doing regular exercise, and having a healthy mind-set.  WORKSHOPS  Exercise: Exercise Basics: Building Your Action Plan Clinical staff led group instruction and group discussion with PowerPoint  presentation and patient guidebook. To enhance the learning environment the use of posters, models and videos may be added. At the conclusion of this workshop, patients will comprehend the difference between physical activity and exercise, as well as the benefits of incorporating both, into their routine. Patients will understand the FITT (Frequency, Intensity, Time, and Type) principle and how to use it to build an exercise action plan. In addition, safety concerns and other considerations for exercise and cardiac rehab will be addressed by the presenter. The purpose of this lesson is to promote a comprehensive and effective weekly exercise routine in order to improve patients' overall level of fitness.   Managing Heart Disease: Your Path to a Healthier Heart Clinical staff led group instruction and group discussion with PowerPoint presentation and patient guidebook. To enhance the learning environment the use of posters, models and videos may be added.At the conclusion of this workshop, patients will understand the anatomy and physiology of the heart. Additionally, they will understand how Pritikin's three pillars impact the risk factors, the progression, and the management of heart disease.  The purpose of this lesson is to provide a high-level overview of the heart, heart disease, and how the Pritikin lifestyle positively impacts risk factors.  Exercise Biomechanics Clinical staff led group instruction and group discussion with PowerPoint presentation and patient guidebook. To enhance the learning environment the use of posters, models and videos may be added. Patients will learn how the structural parts of their bodies function and how these functions impact their daily activities, movement, and exercise. Patients will learn how to promote a neutral spine, learn how to manage pain, and identify ways to improve their physical movement in order to promote healthy living. The purpose of this  lesson is to expose patients to common physical limitations that impact  physical activity. Participants will learn practical ways to adapt and manage aches and pains, and to minimize their effect on regular exercise. Patients will learn how to maintain good posture while sitting, walking, and lifting.  Balance Training and Fall Prevention  Clinical staff led group instruction and group discussion with PowerPoint presentation and patient guidebook. To enhance the learning environment the use of posters, models and videos may be added. At the conclusion of this workshop, patients will understand the importance of their sensorimotor skills (vision, proprioception, and the vestibular system) in maintaining their ability to balance as they age. Patients will apply a variety of balancing exercises that are appropriate for their current level of function. Patients will understand the common causes for poor balance, possible solutions to these problems, and ways to modify their physical environment in order to minimize their fall risk. The purpose of this lesson is to teach patients about the importance of maintaining balance as they age and ways to minimize their risk of falling.  WORKSHOPS   Nutrition:  Fueling a Ship broker led group instruction and group discussion with PowerPoint presentation and patient guidebook. To enhance the learning environment the use of posters, models and videos may be added. Patients will review the foundational principles of the Pritikin Eating Plan and understand what constitutes a serving size in each of the food groups. Patients will also learn Pritikin-friendly foods that are better choices when away from home and review make-ahead meal and snack options. Calorie density will be reviewed and applied to three nutrition priorities: weight maintenance, weight loss, and weight gain. The purpose of this lesson is to reinforce (in a group setting) the key  concepts around what patients are recommended to eat and how to apply these guidelines when away from home by planning and selecting Pritikin-friendly options. Patients will understand how calorie density may be adjusted for different weight management goals.  Mindful Eating  Clinical staff led group instruction and group discussion with PowerPoint presentation and patient guidebook. To enhance the learning environment the use of posters, models and videos may be added. Patients will briefly review the concepts of the Pritikin Eating Plan and the importance of low-calorie dense foods. The concept of mindful eating will be introduced as well as the importance of paying attention to internal hunger signals. Triggers for non-hunger eating and techniques for dealing with triggers will be explored. The purpose of this lesson is to provide patients with the opportunity to review the basic principles of the Pritikin Eating Plan, discuss the value of eating mindfully and how to measure internal cues of hunger and fullness using the Hunger Scale. Patients will also discuss reasons for non-hunger eating and learn strategies to use for controlling emotional eating.  Targeting Your Nutrition Priorities Clinical staff led group instruction and group discussion with PowerPoint presentation and patient guidebook. To enhance the learning environment the use of posters, models and videos may be added. Patients will learn how to determine their genetic susceptibility to disease by reviewing their family history. Patients will gain insight into the importance of diet as part of an overall healthy lifestyle in mitigating the impact of genetics and other environmental insults. The purpose of this lesson is to provide patients with the opportunity to assess their personal nutrition priorities by looking at their family history, their own health history and current risk factors. Patients will also be able to discuss ways of  prioritizing and modifying the Pritikin Eating Plan for their highest risk areas  Menu  Clinical staff led group instruction and group discussion with PowerPoint presentation and patient guidebook. To enhance the learning environment the use of posters, models and videos may be added. Using menus brought in from [employer], or printed from [employer], patients will apply the Pritikin dining out guidelines that were presented in the [employer] video. Patients will also be able to practice these guidelines in a variety of provided scenarios. The purpose of this lesson is to provide patients with the opportunity to practice hands-on learning of the Pritikin Dining Out guidelines with actual menus and practice scenarios.  Label Reading Clinical staff led group instruction and group discussion with PowerPoint presentation and patient guidebook. To enhance the learning environment the use of posters, models and videos may be added. Patients will review and discuss the Pritikin label reading guidelines presented in Pritikin's Label Reading Educational series video. Using fool labels brought in from local grocery stores and markets, patients will apply the label reading guidelines and determine if the packaged food meet the Pritikin guidelines. The purpose of this lesson is to provide patients with the opportunity to review, discuss, and practice hands-on learning of the Pritikin Label Reading guidelines with actual packaged food labels. Cooking School  Pritikin's [employer] are designed to teach patients ways to prepare quick, simple, and affordable recipes at home. The importance of nutrition's role in chronic disease risk reduction is reflected in its emphasis in the overall Pritikin program. By learning how to prepare essential core Pritikin Eating Plan recipes, patients will increase control over what they eat; be able to customize the flavor of foods without the use  of added salt, sugar, or fat; and improve the quality of the food they consume. By learning a set of core recipes which are easily assembled, quickly prepared, and affordable, patients are more likely to prepare more healthy foods at home. These workshops focus on convenient breakfasts, simple entres, side dishes, and desserts which can be prepared with minimal effort and are consistent with nutrition recommendations for cardiovascular risk reduction. Cooking [employer] are taught by a Armed forces logistics/support/administrative officer (RD) who has been trained by the [employer]. The chef or RD has a clear understanding of the importance of minimizing - if not completely eliminating - added fat, sugar, and sodium in recipes. Throughout the series of Cooking School Workshop sessions, patients will learn about healthy ingredients and efficient methods of cooking to build confidence in their capability to prepare    Cooking School weekly topics:  Adding Flavor- Sodium-Free  Fast and Healthy Breakfasts  Powerhouse Plant-Based Proteins  Satisfying Salads and Dressings  Simple Sides and Sauces  International Cuisine-Spotlight on the [employer] Zones  Delicious Desserts  Savory Soups  [employer] - Meals in a Astronomer Appetizers and Snacks  Comforting Weekend Breakfasts  One-Pot Wonders   Fast Evening Meals  Landscape architect Your Pritikin Plate  WORKSHOPS   Healthy Mindset (Psychosocial):  Focused Goals, Sustainable Changes Clinical staff led group instruction and group discussion with PowerPoint presentation and patient guidebook. To enhance the learning environment the use of posters, models and videos may be added. Patients will be able to apply effective goal setting strategies to establish at least one personal goal, and then take consistent, meaningful action toward that goal. They will learn to identify common barriers to achieving personal goals and develop strategies  to overcome them. Patients will also gain an understanding of how our  mind-set can impact our ability to achieve goals and the importance of cultivating a positive and growth-oriented mind-set. The purpose of this lesson is to provide patients with a deeper understanding of how to set and achieve personal goals, as well as the tools and strategies needed to overcome common obstacles which may arise along the way.  From Head to Heart: The Power of a Healthy Outlook  Clinical staff led group instruction and group discussion with PowerPoint presentation and patient guidebook. To enhance the learning environment the use of posters, models and videos may be added. Patients will be able to recognize and describe the impact of emotions and mood on physical health. They will discover the importance of self-care and explore self-care practices which may work for them. Patients will also learn how to utilize the 4 C's to cultivate a healthier outlook and better manage stress and challenges. The purpose of this lesson is to demonstrate to patients how a healthy outlook is an essential part of maintaining good health, especially as they continue their cardiac rehab journey.  Healthy Sleep for a Healthy Heart Clinical staff led group instruction and group discussion with PowerPoint presentation and patient guidebook. To enhance the learning environment the use of posters, models and videos may be added. At the conclusion of this workshop, patients will be able to demonstrate knowledge of the importance of sleep to overall health, well-being, and quality of life. They will understand the symptoms of, and treatments for, common sleep disorders. Patients will also be able to identify daytime and nighttime behaviors which impact sleep, and they will be able to apply these tools to help manage sleep-related challenges. The purpose of this lesson is to provide patients with a general overview of sleep and outline the importance  of quality sleep. Patients will learn about a few of the most common sleep disorders. Patients will also be introduced to the concept of "sleep hygiene," and discover ways to self-manage certain sleeping problems through simple daily behavior changes. Finally, the workshop will motivate patients by clarifying the links between quality sleep and their goals of heart-healthy living.   Recognizing and Reducing Stress Clinical staff led group instruction and group discussion with PowerPoint presentation and patient guidebook. To enhance the learning environment the use of posters, models and videos may be added. At the conclusion of this workshop, patients will be able to understand the types of stress reactions, differentiate between acute and chronic stress, and recognize the impact that chronic stress has on their health. They will also be able to apply different coping mechanisms, such as reframing negative self-talk. Patients will have the opportunity to practice a variety of stress management techniques, such as deep abdominal breathing, progressive muscle relaxation, and/or guided imagery.  The purpose of this lesson is to educate patients on the role of stress in their lives and to provide healthy techniques for coping with it.  Learning Barriers/Preferences:  Learning Barriers/Preferences - 10/21/23 1400       Learning Barriers/Preferences   Learning Barriers Sight;Hearing;Exercise Concerns    Learning Preferences Group Instruction;Individual Instruction;Skilled Demonstration             Education Topics:  Knowledge Questionnaire Score:  Knowledge Questionnaire Score - 10/21/23 1400       Knowledge Questionnaire Score   Pre Score 24/24             Core Components/Risk Factors/Patient Goals at Admission:  Personal Goals and Risk Factors at Admission - 10/21/23 1317  Core Components/Risk Factors/Patient Goals on Admission   Hypertension Yes    Intervention Provide  education on lifestyle modifcations including regular physical activity/exercise, weight management, moderate sodium restriction and increased consumption of fresh fruit, vegetables, and low fat dairy, alcohol moderation, and smoking cessation.;Monitor prescription use compliance.    Expected Outcomes Short Term: Continued assessment and intervention until BP is < 140/19mm HG in hypertensive participants. < 130/70mm HG in hypertensive participants with diabetes, heart failure or chronic kidney disease.;Long Term: Maintenance of blood pressure at goal levels.    Lipids Yes    Intervention Provide education and support for participant on nutrition & aerobic/resistive exercise along with prescribed medications to achieve LDL 70mg , HDL >40mg .    Expected Outcomes Short Term: Participant states understanding of desired cholesterol values and is compliant with medications prescribed. Participant is following exercise prescription and nutrition guidelines.;Long Term: Cholesterol controlled with medications as prescribed, with individualized exercise RX and with personalized nutrition plan. Value goals: LDL < 70mg , HDL > 40 mg.    Personal Goal Other Yes    Personal Goal Pt would like to increase stamina and get into shape and travel to [place] with his family    Intervention Will continue to monitor pt and progress workloads as tolerated without sign or symptom    Expected Outcomes Pt will achieve his goals and gain strength             Core Components/Risk Factors/Patient Goals Review:   Goals and Risk Factor Review     Row Name 10/28/23 1141 11/16/23 1722 12/15/23 0754         Core Components/Risk Factors/Patient Goals Review   Personal Goals Review Weight Management/Obesity;Lipids;Hypertension;Diabetes Weight Management/Obesity;Lipids;Hypertension;Diabetes Weight Management/Obesity;Lipids;Hypertension;Diabetes     Review Harpreet started cardiac rehab on 10/27/23. Reinaldo did well with exercise.  Vital signs and CBg's were stable. Eliazar is somewhat deconditioned. Niquan is off to a good start  with exercise. Vital signs and CBg's remain stable. Kaeden has lost 4.3 kg since starting cardiac rehab. Ledon continues to do well  with exercise. Vital signs and CBg's remain stable. Olie has lost 3.6 kg since starting cardiac rehab.     Expected Outcomes Bodi will continue to participate in cardiac rehab for exercise, nutrtion and lifestyle modifications. Kartel will continue to participate in cardiac rehab for exercise, nutrtion and lifestyle modifications. Stancil will continue to participate in cardiac rehab for exercise, nutrtion and lifestyle modifications.              Core Components/Risk Factors/Patient Goals at Discharge (Final Review):   Goals and Risk Factor Review - 12/15/23 0754       Core Components/Risk Factors/Patient Goals Review   Personal Goals Review Weight Management/Obesity;Lipids;Hypertension;Diabetes    Review Anthem continues to do well  with exercise. Vital signs and CBg's remain stable. Lavi has lost 3.6 kg since starting cardiac rehab.    Expected Outcomes Dierks will continue to participate in cardiac rehab for exercise, nutrtion and lifestyle modifications.             ITP Comments:  ITP Comments     Row Name 10/21/23 1310 10/27/23 0848 10/28/23 1107 11/16/23 1719 12/15/23 0753   ITP Comments Dr. Gaylyn Keas medical director. Introduction to Pritikin education program/intensive cardiac rehab. Initial orientation packet reviewed with patient 30 Day ITP Review. Romy is scheduled to exercise at cardiac rehab on 10/27/23 30 Day ITP Review. Zekiah started cardiac rehab on 10/28/23. Esaw did well with exercise for his fitness level 30  Day ITP Review. Keian has good attendance and participation with exercise at cardiac rehab 30 Day ITP Review. Kwamaine continues to have good attendance and participation with exercise at cardiac rehab            Comments: See ITP  comments.Monte Antonio RN BSN

## 2023-12-16 ENCOUNTER — Other Ambulatory Visit

## 2023-12-16 ENCOUNTER — Encounter: Admitting: Hematology and Oncology

## 2023-12-16 ENCOUNTER — Ambulatory Visit (HOSPITAL_COMMUNITY)
Admission: RE | Admit: 2023-12-16 | Discharge: 2023-12-16 | Disposition: A | Discharge status: Home / Self Care | Source: Ambulatory Visit | Attending: Radiology | Admitting: Radiology

## 2023-12-16 ENCOUNTER — Encounter (HOSPITAL_COMMUNITY): Payer: Self-pay | Admitting: Radiology

## 2023-12-16 ENCOUNTER — Ambulatory Visit (HOSPITAL_COMMUNITY)
Admission: RE | Admit: 2023-12-16 | Discharge: 2023-12-16 | Disposition: A | Discharge status: Home / Self Care | Source: Ambulatory Visit | Attending: Thoracic Surgery (Cardiothoracic Vascular Surgery) | Admitting: Thoracic Surgery (Cardiothoracic Vascular Surgery)

## 2023-12-16 ENCOUNTER — Other Ambulatory Visit (HOSPITAL_COMMUNITY): Payer: Self-pay | Admitting: Radiology

## 2023-12-16 DIAGNOSIS — J90 Pleural effusion, not elsewhere classified: Secondary | ICD-10-CM

## 2023-12-16 DIAGNOSIS — Z48813 Encounter for surgical aftercare following surgery on the respiratory system: Secondary | ICD-10-CM | POA: Diagnosis not present

## 2023-12-16 HISTORY — PX: IR THORACENTESIS ASP PLEURAL SPACE W/IMG GUIDE: IMG5380

## 2023-12-16 NOTE — Procedures (Signed)
 PROCEDURE SUMMARY:  Successful US  guided left thoracentesis. Yielded 1 Liter of pale amber fluid. Procedure discontinued early due to patient reporting pain in the chest, which resolved prior to discharge.  No immediate complications. EBL = trace  No specimens requested   Post procedure chest X-ray reviewed by Dr. Harrison Lin. There is no evidence of post procedure pneumothorax.  Neomia Banner Dorothy Landgrebe PA-C 12/16/2023 9:52 AM

## 2023-12-17 ENCOUNTER — Encounter (HOSPITAL_COMMUNITY)
Admission: RE | Admit: 2023-12-17 | Discharge: 2023-12-17 | Disposition: A | Discharge status: Home / Self Care | Source: Ambulatory Visit | Attending: Cardiovascular Disease | Admitting: Cardiovascular Disease

## 2023-12-17 DIAGNOSIS — Z952 Presence of prosthetic heart valve: Secondary | ICD-10-CM

## 2023-12-17 DIAGNOSIS — Z951 Presence of aortocoronary bypass graft: Secondary | ICD-10-CM

## 2023-12-17 NOTE — Telephone Encounter (Signed)
 Left message for patient to call back on Tuesday.

## 2023-12-17 NOTE — Progress Notes (Addendum)
 Mj had a thoracentesis yesterday. Asymptomatic today. Blood pressure 120/60. Heart rate 59. Oxygen  saturation 97% on room air. Discussed with onsite provider Rejeana Card NP. Okay to resume exercise as vital signs are stable.Monte Antonio RN BSN

## 2023-12-22 ENCOUNTER — Encounter (HOSPITAL_COMMUNITY)
Admission: RE | Admit: 2023-12-22 | Discharge: 2023-12-22 | Disposition: A | Discharge status: Home / Self Care | Source: Ambulatory Visit | Attending: Cardiovascular Disease | Admitting: Cardiovascular Disease

## 2023-12-22 DIAGNOSIS — Z952 Presence of prosthetic heart valve: Secondary | ICD-10-CM

## 2023-12-22 DIAGNOSIS — Z951 Presence of aortocoronary bypass graft: Secondary | ICD-10-CM

## 2023-12-24 ENCOUNTER — Encounter (HOSPITAL_COMMUNITY)
Admission: RE | Admit: 2023-12-24 | Discharge: 2023-12-24 | Disposition: A | Discharge status: Home / Self Care | Source: Ambulatory Visit | Attending: Cardiovascular Disease | Admitting: Cardiovascular Disease

## 2023-12-24 DIAGNOSIS — Z951 Presence of aortocoronary bypass graft: Secondary | ICD-10-CM

## 2023-12-24 DIAGNOSIS — Z952 Presence of prosthetic heart valve: Secondary | ICD-10-CM

## 2023-12-27 ENCOUNTER — Encounter (HOSPITAL_COMMUNITY)
Admission: RE | Admit: 2023-12-27 | Discharge: 2023-12-27 | Disposition: A | Discharge status: Home / Self Care | Source: Ambulatory Visit | Attending: Cardiovascular Disease | Admitting: Cardiovascular Disease

## 2023-12-27 DIAGNOSIS — Z951 Presence of aortocoronary bypass graft: Secondary | ICD-10-CM | POA: Diagnosis not present

## 2023-12-27 DIAGNOSIS — Z952 Presence of prosthetic heart valve: Secondary | ICD-10-CM

## 2023-12-27 DIAGNOSIS — Z9889 Other specified postprocedural states: Secondary | ICD-10-CM | POA: Insufficient documentation

## 2023-12-29 ENCOUNTER — Encounter (HOSPITAL_COMMUNITY)
Admission: RE | Admit: 2023-12-29 | Discharge: 2023-12-29 | Disposition: A | Discharge status: Home / Self Care | Source: Ambulatory Visit | Attending: Cardiovascular Disease | Admitting: Cardiovascular Disease

## 2023-12-29 DIAGNOSIS — Z951 Presence of aortocoronary bypass graft: Secondary | ICD-10-CM

## 2023-12-29 DIAGNOSIS — Z952 Presence of prosthetic heart valve: Secondary | ICD-10-CM

## 2023-12-29 LAB — GLUCOSE, CAPILLARY: Glucose-Capillary: 201 mg/dL — ABNORMAL HIGH (ref 70–99)

## 2023-12-31 ENCOUNTER — Encounter (HOSPITAL_COMMUNITY)
Admission: RE | Admit: 2023-12-31 | Discharge: 2023-12-31 | Disposition: A | Discharge status: Home / Self Care | Source: Ambulatory Visit | Attending: Cardiovascular Disease | Admitting: Cardiovascular Disease

## 2023-12-31 DIAGNOSIS — Z951 Presence of aortocoronary bypass graft: Secondary | ICD-10-CM | POA: Diagnosis not present

## 2023-12-31 DIAGNOSIS — Z952 Presence of prosthetic heart valve: Secondary | ICD-10-CM

## 2024-01-03 ENCOUNTER — Encounter (HOSPITAL_COMMUNITY)
Admission: RE | Admit: 2024-01-03 | Discharge: 2024-01-03 | Disposition: A | Discharge status: Home / Self Care | Source: Ambulatory Visit | Attending: Cardiovascular Disease | Admitting: Cardiovascular Disease

## 2024-01-03 DIAGNOSIS — Z951 Presence of aortocoronary bypass graft: Secondary | ICD-10-CM

## 2024-01-03 DIAGNOSIS — Z952 Presence of prosthetic heart valve: Secondary | ICD-10-CM

## 2024-01-03 LAB — GLUCOSE, CAPILLARY: Glucose-Capillary: 207 mg/dL — ABNORMAL HIGH (ref 70–99)

## 2024-01-05 ENCOUNTER — Encounter (HOSPITAL_COMMUNITY)
Admission: RE | Admit: 2024-01-05 | Discharge: 2024-01-05 | Disposition: A | Discharge status: Home / Self Care | Source: Ambulatory Visit | Attending: Cardiovascular Disease | Admitting: Cardiovascular Disease

## 2024-01-05 DIAGNOSIS — Z951 Presence of aortocoronary bypass graft: Secondary | ICD-10-CM | POA: Diagnosis not present

## 2024-01-05 DIAGNOSIS — Z952 Presence of prosthetic heart valve: Secondary | ICD-10-CM

## 2024-01-06 ENCOUNTER — Other Ambulatory Visit: Payer: Self-pay | Admitting: Neurology

## 2024-01-06 ENCOUNTER — Inpatient Hospital Stay: Attending: Hematology and Oncology | Admitting: Hematology and Oncology

## 2024-01-06 ENCOUNTER — Inpatient Hospital Stay

## 2024-01-06 VITALS — BP 122/78 | HR 56 | Temp 97.8°F | Resp 13 | Wt 179.7 lb

## 2024-01-06 DIAGNOSIS — D696 Thrombocytopenia, unspecified: Secondary | ICD-10-CM | POA: Diagnosis not present

## 2024-01-06 DIAGNOSIS — D500 Iron deficiency anemia secondary to blood loss (chronic): Secondary | ICD-10-CM

## 2024-01-06 DIAGNOSIS — D649 Anemia, unspecified: Secondary | ICD-10-CM | POA: Insufficient documentation

## 2024-01-06 DIAGNOSIS — Z87891 Personal history of nicotine dependence: Secondary | ICD-10-CM | POA: Diagnosis not present

## 2024-01-06 LAB — CBC WITH DIFFERENTIAL (CANCER CENTER ONLY)
Abs Immature Granulocytes: 0.01 10*3/uL (ref 0.00–0.07)
Basophils Absolute: 0 10*3/uL (ref 0.0–0.1)
Basophils Relative: 0 %
Eosinophils Absolute: 0.2 10*3/uL (ref 0.0–0.5)
Eosinophils Relative: 3 %
HCT: 29.3 % — ABNORMAL LOW (ref 39.0–52.0)
Hemoglobin: 10.1 g/dL — ABNORMAL LOW (ref 13.0–17.0)
Immature Granulocytes: 0 %
Lymphocytes Relative: 26 %
Lymphs Abs: 1.2 10*3/uL (ref 0.7–4.0)
MCH: 30.9 pg (ref 26.0–34.0)
MCHC: 34.5 g/dL (ref 30.0–36.0)
MCV: 89.6 fL (ref 80.0–100.0)
Monocytes Absolute: 0.4 10*3/uL (ref 0.1–1.0)
Monocytes Relative: 8 %
Neutro Abs: 2.9 10*3/uL (ref 1.7–7.7)
Neutrophils Relative %: 63 %
Platelet Count: 96 10*3/uL — ABNORMAL LOW (ref 150–400)
RBC: 3.27 MIL/uL — ABNORMAL LOW (ref 4.22–5.81)
RDW: 13.5 % (ref 11.5–15.5)
Smear Review: NORMAL
WBC Count: 4.7 10*3/uL (ref 4.0–10.5)
nRBC: 0 % (ref 0.0–0.2)

## 2024-01-06 LAB — CMP (CANCER CENTER ONLY)
ALT: 16 U/L (ref 0–44)
AST: 17 U/L (ref 15–41)
Albumin: 3.8 g/dL (ref 3.5–5.0)
Alkaline Phosphatase: 50 U/L (ref 38–126)
Anion gap: 11 (ref 5–15)
BUN: 47 mg/dL — ABNORMAL HIGH (ref 8–23)
CO2: 23 mmol/L (ref 22–32)
Calcium: 9 mg/dL (ref 8.9–10.3)
Chloride: 102 mmol/L (ref 98–111)
Creatinine: 2.81 mg/dL — ABNORMAL HIGH (ref 0.61–1.24)
GFR, Estimated: 23 mL/min — ABNORMAL LOW (ref >60)
Glucose, Bld: 187 mg/dL — ABNORMAL HIGH (ref 70–99)
Potassium: 4.5 mmol/L (ref 3.5–5.1)
Sodium: 136 mmol/L (ref 135–145)
Total Bilirubin: 1.1 mg/dL (ref 0.0–1.2)
Total Protein: 6.3 g/dL — ABNORMAL LOW (ref 6.5–8.1)

## 2024-01-06 LAB — IRON AND IRON BINDING CAPACITY (CC-WL,HP ONLY)
Iron: 75 ug/dL (ref 45–182)
Saturation Ratios: 23 % (ref 17.9–39.5)
TIBC: 333 ug/dL (ref 250–450)
UIBC: 258 ug/dL

## 2024-01-06 LAB — RETIC PANEL
Immature Retic Fract: 15.8 % (ref 2.3–15.9)
RBC.: 3.26 MIL/uL — ABNORMAL LOW (ref 4.22–5.81)
Retic Count, Absolute: 100.1 10*3/uL (ref 19.0–186.0)
Retic Ct Pct: 3.1 % (ref 0.4–3.1)
Reticulocyte Hemoglobin: 34.8 pg (ref >27.9)

## 2024-01-06 LAB — WBC/PLT IN CITRATE

## 2024-01-06 LAB — VITAMIN B12: Vitamin B-12: 428 pg/mL (ref 180–914)

## 2024-01-06 LAB — IMMATURE PLATELET FRACTION: Immature Platelet Fraction: 2.7 % (ref 1.2–8.6)

## 2024-01-06 LAB — FOLATE: Folate: 11.9 ng/mL (ref >5.9)

## 2024-01-06 LAB — LACTATE DEHYDROGENASE: LDH: 226 U/L — ABNORMAL HIGH (ref 98–192)

## 2024-01-06 NOTE — Progress Notes (Signed)
 [facility] [phone]) [phone]   [phone]  INITIAL CONSULT NOTE  Patient Care Team: [name], MD as PCP - General (Internal Medicine) Delford Maude BROCKS, MD as PCP - Cardiology (Cardiology) Kennyth Chew, MD as PCP - Electrophysiology (Cardiology) Tobie Tonita POUR, DO as Consulting Physician (Neurology)  Hematological/Oncological History # Thrombocytopenia  # Normocytic Anemia  08/23/2023: WBC 13.2, Hgb 8.8, MCV 93, Plt 234  10/20/2023: WBC 6.4, Hgb 10.4, MCV 95, Plt 101  01/06/2024: establish care with Dr. Federico   CHIEF COMPLAINTS/PURPOSE OF CONSULTATION:  Thrombocytopenia    HISTORY OF PRESENTING ILLNESS:  Chad [name] 78 y.o. male with medical history significant for hypothyroidism, hypertension, GERD, type 2 diabetes, and CKD who presents for evaluation of thrombocytopenia.  On review of the previous records Chad [name] had labs drawn on 08/23/2023 which showed white blood cells 13.2, Hgb 8.8, MCV 93, Plt 234.  Subsequently on 10/20/2018 5 repeat labs were drawn and showed WBC 6.4, Hgb 10.4, MCV 95, Plt 101.  Due to concern for his anemia and thrombocytopenia he was referred to hematology for further evaluation and management.  On exam today Chad [name] is accompanied by his wife of 55 years.  He reports that he had open heart surgery in January 2025 and has had difficulties with his blood counts since then.  He reports he does not normally have any issues with bleeding, bruising, or dark stools.  He reports he does enjoy eating a hamburger and steak and other iron  rich foods.  He reports his energy level is good, currently ranking his energy as an 8.5 out of 10.  He has not had any recent illnesses with runny nose, sore throat, cough or fevers, chills, sweats.  On further discussion he reports that his mother died watching television with no autopsy performed.  His father died of heart attack and his paternal grandfather died of heart attack.  His maternal  grandmother had kidney cancer.  He has 2 healthy children.  He reports that he is a former smoker having quit in 1994.  He does not drink any alcohol.  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  Otherwise he has been his baseline level of health with no other major changes.  A full 10 point ROS is otherwise negative.  MEDICAL HISTORY:  Past Medical History:  Diagnosis Date   Cataract    CKD (chronic kidney disease)    Colon polyps    Diabetes (HCC)    Diabetic polyneuropathy associated with type 2 diabetes mellitus (HCC) 01/15/2017   Diabetic retinopathy (HCC)    GERD (gastroesophageal reflux disease)    Hypercholesterolemia    Hypertension    Hypertensive retinopathy    Hypothyroidism    Polyneuropathy    Proteinuria    Severe aortic stenosis     SURGICAL HISTORY: Past Surgical History:  Procedure Laterality Date   CARDIAC SURGERY     IR THORACENTESIS ASP PLEURAL SPACE W/IMG GUIDE  09/29/2023   IR THORACENTESIS ASP PLEURAL SPACE W/IMG GUIDE  12/16/2023   RIGHT/LEFT HEART CATH AND CORONARY ANGIOGRAPHY N/A 05/21/2023   Procedure: RIGHT/LEFT HEART CATH AND CORONARY ANGIOGRAPHY;  Surgeon: Thukkani, Arun K, MD;  Location: [facility];  Service: Cardiovascular;  Laterality: N/A;    SOCIAL HISTORY: Social History   Socioeconomic History   Marital status: Married    Spouse name: Not on file   Number of children: 2   Years of education:  college   Highest education level: Not on file  Occupational History   Occupation: Health visitor  Tobacco Use   Smoking status: Former   Smokeless tobacco: Never  Advertising account planner   Vaping status: Never Used  Substance and Sexual Activity   Alcohol use: Yes   Drug use: No   Sexual activity: Not Currently  Other Topics Concern   Not on file  Social History Narrative   Patient lives with wife in a 2 story home.  Has 2 children.  Works as a Health visitor.  Education:  college.       Right handed   Social Drivers of Health   Financial Resource Strain: Low Risk  (08/11/2023)   Received from [facility]   Overall Financial Resource Strain (CARDIA)    Difficulty of Paying Living Expenses: Not hard at all  Food Insecurity: No Food Insecurity (01/06/2024)   Hunger Vital Sign    Worried About Running Out of Food in the Last Year: Never true    Ran Out of Food in the Last Year: Never true  Transportation Needs: No Transportation Needs (09/29/2023)   PRAPARE - Administrator, Civil Service (Medical): No    Lack of Transportation (Non-Medical): No  Physical Activity: Not on file  Stress: Not on file  Social Connections: Socially Integrated (09/29/2023)   Social Connection and Isolation Panel    Frequency of Communication with Friends and Family: More than three times a week    Frequency of Social Gatherings with Friends and Family: More than three times a week    Attends Religious Services: More than 4 times per year    Active Member of [employer] or Organizations: No    Attends Banker Meetings: 1 to 4 times per year    Marital Status: Married  Catering manager Violence: Not At Risk (01/06/2024)   Humiliation, Afraid, Rape, and Kick questionnaire    Fear of Current or Ex-Partner: No    Emotionally Abused: No    Physically Abused: No    Sexually Abused: No    FAMILY HISTORY: Family History  Problem Relation Age of Onset   Stroke Father    Glaucoma Father     ALLERGIES:  is allergic to pork-derived products.  MEDICATIONS:  Current Outpatient Medications  Medication Sig Dispense Refill   aspirin  EC 81 MG tablet Take 81 mg by mouth daily. Swallow whole.     cyanocobalamin (VITAMIN B12) 1000 MCG tablet Take 1 tablet (1,000 mcg total) by mouth daily. 90 tablet 1   amLODipine  (NORVASC ) 5 MG tablet Take 1 tablet (5 mg total) by mouth daily. 30 tablet 1   B-D ULTRAFINE III SHORT PEN 31G X 8 MM MISC SMARTSIG:1 Each  SUB-Q Daily     gabapentin  (NEURONTIN ) 300 MG capsule TAKE 2 CAPSULES BY MOUTH TWICE A DAY 360 capsule 0   insulin  glargine (LANTUS ) 100 UNIT/ML injection Inject 14 Units into the skin at bedtime.     JARDIANCE  10 MG TABS tablet Take 10 mg by mouth daily.     levothyroxine  (SYNTHROID ) 100 MCG tablet Take 1 tablet (100 mcg total) by mouth daily before breakfast. 30 tablet 0   metoprolol  succinate (TOPROL  XL) 25 MG 24 hr tablet Take 0.5 tablets (12.5 mg total) by mouth daily. 45 tablet 3   ONETOUCH VERIO test strip      polyethylene glycol (MIRALAX  / GLYCOLAX ) 17 g packet Take 17 g by mouth as needed  for moderate constipation.     rosuvastatin  (CRESTOR ) 20 MG tablet Take 20 mg by mouth daily.     No current facility-administered medications for this visit.    REVIEW OF SYSTEMS:   Constitutional: ( - ) fevers, ( - )  chills , ( - ) night sweats Eyes: ( - ) blurriness of vision, ( - ) double vision, ( - ) watery eyes Ears, nose, mouth, throat, and face: ( - ) mucositis, ( - ) sore throat Respiratory: ( - ) cough, ( - ) dyspnea, ( - ) wheezes Cardiovascular: ( - ) palpitation, ( - ) chest discomfort, ( - ) lower extremity swelling Gastrointestinal:  ( - ) nausea, ( - ) heartburn, ( - ) change in bowel habits Skin: ( - ) abnormal skin rashes Lymphatics: ( - ) new lymphadenopathy, ( - ) easy bruising Neurological: ( - ) numbness, ( - ) tingling, ( - ) new weaknesses Behavioral/Psych: ( - ) mood change, ( - ) new changes  All other systems were reviewed with the patient and are negative.  PHYSICAL EXAMINATION:  Vitals:   01/06/24 1407  BP: 122/78  Pulse: (!) 56  Resp: 13  Temp: 97.8 F (36.6 C)  SpO2: 99%   Filed Weights   01/06/24 1407  Weight: 179 lb 11.2 oz (81.5 kg)    GENERAL: well appearing elderly Caucasian male in NAD  SKIN: skin color, texture, turgor are normal, no rashes or significant lesions EYES: conjunctiva are pink and non-injected, sclera clear LUNGS: clear to  auscultation and percussion with normal breathing effort HEART: regular rate & rhythm and no murmurs and no lower extremity edema Musculoskeletal: no cyanosis of digits and no clubbing  PSYCH: alert & oriented x 3, fluent speech NEURO: no focal motor/sensory deficits  LABORATORY DATA:  I have reviewed the data as listed    Latest Ref Rng & Units 01/06/2024    2:46 PM 10/20/2023    2:09 PM 10/01/2023    7:34 AM  CBC  WBC 4.0 - 10.5 K/uL 4.7  6.4  6.7   Hemoglobin 13.0 - 17.0 g/dL 89.8  89.5  8.8   Hematocrit 39.0 - 52.0 % 29.3  32.5  26.5   Platelets 150 - 400 K/uL 96  101  90        Latest Ref Rng & Units 01/06/2024    2:46 PM 10/20/2023    2:09 PM 10/01/2023    7:34 AM  CMP  Glucose 70 - 99 mg/dL 812  839  863   BUN 8 - 23 mg/dL 47  41  52   Creatinine 0.61 - 1.24 mg/dL 7.18  6.44  6.38   Sodium 135 - 145 mmol/L 136  136  131   Potassium 3.5 - 5.1 mmol/L 4.5  4.7  3.7   Chloride 98 - 111 mmol/L 102  104  104   CO2 22 - 32 mmol/L 23  19  19    Calcium  8.9 - 10.3 mg/dL 9.0  8.8  8.4   Total Protein 6.5 - 8.1 g/dL 6.3  6.1  4.8   Total Bilirubin 0.0 - 1.2 mg/dL 1.1  0.5  0.4   Alkaline Phos 38 - 126 U/L 50  74  63   AST 15 - 41 U/L 17  20  47   ALT 0 - 44 U/L 16  17  44      ASSESSMENT & PLAN Chad [name] 78 y.o. male with medical  history significant for hypothyroidism, hypertension, GERD, type 2 diabetes, and CKD who presents for evaluation of thrombocytopenia.  After review of the labs, review of the records, and discussion with the patient the patients findings are most consistent with anemia from CKD with thrombocytopenia of unclear etiology.   Thrombocytopenia is a common condition with a broad differential. The possible etiologies of thrombocytopenia include liver disease, splenomegaly, infectious process, nutritional deficiency, consumption/autoimmune destruction, pseudothrombocytopenia, and bone marrow disorders. Cirrhosis and liver disease the most common causes of  moderate thrombocytopenia. Evaluation should include full hepatitis serologies (Hep B and C) as well as HIV. Imaging of the liver spleen should be performed with an abdominal US  ( if prior imaging is no readily available). Nutritional etiologies should be ruled out with Vitamin b12 and folate testing.  A peripheral blood smear can help determine if there is clumping leading to pseudothrombocytopenia. If no clear etiology can be found would need to consider immune thrombocytopenia (ITP) with consideration of bone marrow biopsy.   #Thrombocytopenia- Mild  --will order CBC, CMP, Vitamin b12 and folate.   --will order immature platelet fraction and platelet by citrate --viral serologies with Hepatitis B, Hepatitis C, and HIV will be ordered if the rest of the workup is inconclusive. --evaluate for liver disease/splenomegaly with abdominal US . If evidence of liver disease will make referral to gastroenterology.   --RTC in 6 months or sooner pending the results of the above studies.    # Normocytic Anemia -- Will order full nutritional studies to include iron  panel, ferritin, vitamin B12, folate, and methylmalonic acid. -- Will order hemolysis labs with LDH and reticulocytes.  If evidence of hemolysis we will also order haptoglobin and DAT -- Rule out multiple myeloma with SPEP and serum free light chains -- If no clear etiology can be found we will need to pursue a bone marrow biopsy. --likely component of CKD causing the low Hgb.  -- Return to clinic pending the results of the above studies.   Orders Placed This Encounter  Procedures   US  Abdomen Complete    Standing Status:   Future    Expected Date:   01/25/2024    Expiration Date:   01/17/2025    Reason for Exam (SYMPTOM  OR DIAGNOSIS REQUIRED):   thrombocytopenia, assess for liver disease/splenomegaly.    Preferred imaging location?:   [facility]   CBC with Differential (Cancer Center Only)    Standing Status:   Future    Number of  Occurrences:   1    Expiration Date:   01/05/2025   CMP (Cancer Center only)    Standing Status:   Future    Number of Occurrences:   1    Expiration Date:   01/05/2025   Immature Platelet Fraction    Standing Status:   Future    Number of Occurrences:   1    Expiration Date:   01/05/2025   WBC/PLT in Citrate   Lactate dehydrogenase (LDH)    Standing Status:   Future    Number of Occurrences:   1    Expiration Date:   01/05/2025   Ferritin    Standing Status:   Future    Number of Occurrences:   1    Expiration Date:   01/05/2025   Iron  and Iron  Binding Capacity ([facility] only)    Standing Status:   Future    Number of Occurrences:   1    Expiration Date:   01/05/2025  Retic Panel    Standing Status:   Future    Number of Occurrences:   1    Expiration Date:   01/05/2025   Vitamin B12    Standing Status:   Future    Number of Occurrences:   1    Expiration Date:   01/05/2025   Methylmalonic acid, serum    Standing Status:   Future    Number of Occurrences:   1    Expiration Date:   01/05/2025   Folate, Serum    Standing Status:   Future    Number of Occurrences:   1    Expiration Date:   01/05/2025   Multiple Myeloma Panel (SPEP&IFE w/QIG)    Standing Status:   Future    Number of Occurrences:   1    Expiration Date:   01/05/2025   Kappa/lambda light chains    Standing Status:   Future    Number of Occurrences:   1    Expiration Date:   01/05/2025   Erythropoietin     Standing Status:   Future    Number of Occurrences:   1    Expiration Date:   01/05/2025    All questions were answered. The patient knows to call the clinic with any problems, questions or concerns.  A total of more than 60 minutes were spent on this encounter with face-to-face time and non-face-to-face time, including preparing to see the patient, ordering tests and/or medications, counseling the patient and coordination of care as outlined above.   Norleen IVAR Kidney, MD Department of  Hematology/Oncology [facility] Phone: [phone] Pager: [phone] Email: [email or web address]  01/18/2024 4:43 PM

## 2024-01-07 ENCOUNTER — Encounter (HOSPITAL_COMMUNITY)
Admission: RE | Admit: 2024-01-07 | Discharge: 2024-01-07 | Disposition: A | Discharge status: Home / Self Care | Source: Ambulatory Visit | Attending: Cardiovascular Disease | Admitting: Cardiovascular Disease

## 2024-01-07 DIAGNOSIS — Z952 Presence of prosthetic heart valve: Secondary | ICD-10-CM

## 2024-01-07 DIAGNOSIS — Z951 Presence of aortocoronary bypass graft: Secondary | ICD-10-CM

## 2024-01-07 LAB — FERRITIN: Ferritin: 260 ng/mL (ref 24–336)

## 2024-01-07 LAB — KAPPA/LAMBDA LIGHT CHAINS
Kappa free light chain: 32.6 mg/L — ABNORMAL HIGH (ref 3.3–19.4)
Kappa, lambda light chain ratio: 1.28 (ref 0.26–1.65)
Lambda free light chains: 25.4 mg/L (ref 5.7–26.3)

## 2024-01-07 LAB — ERYTHROPOIETIN: Erythropoietin: 10.2 m[IU]/mL (ref 2.6–18.5)

## 2024-01-09 LAB — MULTIPLE MYELOMA PANEL, SERUM
Albumin SerPl Elph-Mcnc: 3.2 g/dL (ref 2.9–4.4)
Albumin/Glob SerPl: 1.2 (ref 0.7–1.7)
Alpha 1: 0.2 g/dL (ref 0.0–0.4)
Alpha2 Glob SerPl Elph-Mcnc: 0.8 g/dL (ref 0.4–1.0)
B-Globulin SerPl Elph-Mcnc: 1 g/dL (ref 0.7–1.3)
Gamma Glob SerPl Elph-Mcnc: 0.8 g/dL (ref 0.4–1.8)
Globulin, Total: 2.8 g/dL (ref 2.2–3.9)
IgA: 119 mg/dL (ref 61–437)
IgG (Immunoglobin G), Serum: 784 mg/dL (ref 603–1613)
IgM (Immunoglobulin M), Srm: 67 mg/dL (ref 15–143)
Total Protein ELP: 6 g/dL (ref 6.0–8.5)

## 2024-01-10 ENCOUNTER — Encounter (HOSPITAL_COMMUNITY)
Admission: RE | Admit: 2024-01-10 | Discharge: 2024-01-10 | Disposition: A | Discharge status: Home / Self Care | Source: Ambulatory Visit | Attending: Cardiovascular Disease | Admitting: Cardiovascular Disease

## 2024-01-10 ENCOUNTER — Other Ambulatory Visit: Payer: Self-pay | Admitting: Thoracic Surgery (Cardiothoracic Vascular Surgery)

## 2024-01-10 VITALS — Ht 68.0 in | Wt 177.2 lb

## 2024-01-10 DIAGNOSIS — Z951 Presence of aortocoronary bypass graft: Secondary | ICD-10-CM

## 2024-01-10 DIAGNOSIS — Z952 Presence of prosthetic heart valve: Secondary | ICD-10-CM

## 2024-01-10 DIAGNOSIS — J90 Pleural effusion, not elsewhere classified: Secondary | ICD-10-CM

## 2024-01-10 LAB — METHYLMALONIC ACID, SERUM: Methylmalonic Acid, Quantitative: 387 nmol/L — ABNORMAL HIGH (ref 0–378)

## 2024-01-10 LAB — GLUCOSE, CAPILLARY
Glucose-Capillary: 172 mg/dL — ABNORMAL HIGH (ref 70–99)
Glucose-Capillary: 212 mg/dL — ABNORMAL HIGH (ref 70–99)

## 2024-01-11 ENCOUNTER — Ambulatory Visit: Admitting: Thoracic Surgery (Cardiothoracic Vascular Surgery)

## 2024-01-11 ENCOUNTER — Ambulatory Visit
Admission: RE | Admit: 2024-01-11 | Discharge: 2024-01-11 | Disposition: A | Discharge status: Home / Self Care | Source: Ambulatory Visit | Attending: Cardiovascular Disease | Admitting: Cardiovascular Disease

## 2024-01-11 ENCOUNTER — Encounter: Payer: Self-pay | Admitting: Thoracic Surgery (Cardiothoracic Vascular Surgery)

## 2024-01-11 VITALS — BP 160/80 | HR 55 | Resp 18 | Ht 68.0 in | Wt 178.0 lb

## 2024-01-11 DIAGNOSIS — E1142 Type 2 diabetes mellitus with diabetic polyneuropathy: Secondary | ICD-10-CM | POA: Insufficient documentation

## 2024-01-11 DIAGNOSIS — I129 Hypertensive chronic kidney disease with stage 1 through stage 4 chronic kidney disease, or unspecified chronic kidney disease: Secondary | ICD-10-CM | POA: Diagnosis not present

## 2024-01-11 DIAGNOSIS — I2510 Atherosclerotic heart disease of native coronary artery without angina pectoris: Secondary | ICD-10-CM | POA: Insufficient documentation

## 2024-01-11 DIAGNOSIS — E785 Hyperlipidemia, unspecified: Secondary | ICD-10-CM | POA: Diagnosis not present

## 2024-01-11 DIAGNOSIS — J90 Pleural effusion, not elsewhere classified: Secondary | ICD-10-CM | POA: Insufficient documentation

## 2024-01-11 DIAGNOSIS — E039 Hypothyroidism, unspecified: Secondary | ICD-10-CM | POA: Diagnosis not present

## 2024-01-11 DIAGNOSIS — Z7984 Long term (current) use of oral hypoglycemic drugs: Secondary | ICD-10-CM | POA: Insufficient documentation

## 2024-01-11 DIAGNOSIS — K219 Gastro-esophageal reflux disease without esophagitis: Secondary | ICD-10-CM | POA: Diagnosis not present

## 2024-01-11 DIAGNOSIS — E1122 Type 2 diabetes mellitus with diabetic chronic kidney disease: Secondary | ICD-10-CM | POA: Insufficient documentation

## 2024-01-11 DIAGNOSIS — Z951 Presence of aortocoronary bypass graft: Secondary | ICD-10-CM | POA: Insufficient documentation

## 2024-01-11 DIAGNOSIS — R918 Other nonspecific abnormal finding of lung field: Secondary | ICD-10-CM | POA: Diagnosis not present

## 2024-01-11 DIAGNOSIS — Z9889 Other specified postprocedural states: Secondary | ICD-10-CM | POA: Insufficient documentation

## 2024-01-11 DIAGNOSIS — N189 Chronic kidney disease, unspecified: Secondary | ICD-10-CM | POA: Diagnosis not present

## 2024-01-11 DIAGNOSIS — J984 Other disorders of lung: Secondary | ICD-10-CM | POA: Diagnosis not present

## 2024-01-11 DIAGNOSIS — Z794 Long term (current) use of insulin: Secondary | ICD-10-CM | POA: Diagnosis not present

## 2024-01-11 DIAGNOSIS — Z471 Aftercare following joint replacement surgery: Secondary | ICD-10-CM | POA: Diagnosis not present

## 2024-01-11 DIAGNOSIS — I350 Nonrheumatic aortic (valve) stenosis: Secondary | ICD-10-CM | POA: Diagnosis not present

## 2024-01-11 MED ORDER — PREDNISONE 10 MG (21) PO TBPK: ORAL_TABLET | ORAL | 0 refills | Status: AC

## 2024-01-11 MED ORDER — METOPROLOL SUCCINATE ER 25 MG PO TB24
12.5000 mg | ORAL_TABLET | Freq: Every day | ORAL | 3 refills | Status: AC
Start: 2024-01-11 — End: ?

## 2024-01-11 NOTE — Progress Notes (Signed)
 [address]       Chad [name] [address]             [phone]      HPI: Chad [name] returns for follow-up of his pleural effusion.  Chad [name] is a 78 year old man with a history of aortic stenosis, CAD, AVR CABG, diabetes, hypertension, hyperlipidemia, polyneuropathy, hypothyroidism, and reflux.  In AVR CABG by Dr. Frosty Jews at [facility] in January 2025.  Postoperatively he had acute on chronic renal dysfunction and atrial fibrillation.  Presented to the emergency room in March with a large pleural effusion.  He had a thoracentesis which drained 1.9 L of fluid.  LDH was elevated.  A follow-up chest x-ray 3 weeks later showed a moderate effusion and he was started on Lasix .  Referred to me in May when a chest x-ray showed some residual effusion.  We had another thoracentesis performed.  That drained a liter of fluid.  He was also treated with a prednisone  taper.  Denies any respiratory issues.  No shortness of breath orthopnea.  Past Medical History:  Diagnosis Date   Cataract    CKD (chronic kidney disease)    Colon polyps    Diabetes (HCC)    Diabetic polyneuropathy associated with type 2 diabetes mellitus (HCC) 01/15/2017   Diabetic retinopathy (HCC)    GERD (gastroesophageal reflux disease)    Hypercholesterolemia    Hypertension    Hypertensive retinopathy    Hypothyroidism    Polyneuropathy    Proteinuria    Severe aortic stenosis     Current Outpatient Medications  Medication Sig Dispense Refill   amLODipine  (NORVASC ) 5 MG tablet Take 1 tablet (5 mg total) by mouth daily. 30 tablet 1   aspirin  EC 81 MG tablet Take 81 mg by mouth daily. Swallow whole.     B-D ULTRAFINE III SHORT PEN 31G X 8 MM MISC SMARTSIG:1 Each SUB-Q Daily     gabapentin  (NEURONTIN ) 300 MG capsule TAKE 2 CAPSULES BY MOUTH TWICE A DAY 360 capsule 0   insulin  glargine (LANTUS ) 100 UNIT/ML injection Inject 14 Units into the skin at bedtime.     JARDIANCE  10 MG TABS tablet Take 10 mg by  mouth daily.     levothyroxine  (SYNTHROID ) 100 MCG tablet Take 1 tablet (100 mcg total) by mouth daily before breakfast. 30 tablet 0   metoprolol  succinate (TOPROL  XL) 25 MG 24 hr tablet Take 0.5 tablets (12.5 mg total) by mouth daily. 45 tablet 3   ONETOUCH VERIO test strip      polyethylene glycol (MIRALAX  / GLYCOLAX ) 17 g packet Take 17 g by mouth as needed for moderate constipation.     predniSONE  (STERAPRED UNI-PAK 21 TAB) 10 MG (21) TBPK tablet Take 6 tablets (60 mg total) by mouth daily for 1 day, THEN 5 tablets (50 mg total) daily for 1 day, THEN 4 tablets (40 mg total) daily for 1 day, THEN 3 tablets (30 mg total) daily for 1 day, THEN 2 tablets (20 mg total) daily for 1 day, THEN 1 tablet (10 mg total) daily for 1 day. 21 tablet 0   rosuvastatin  (CRESTOR ) 20 MG tablet Take 20 mg by mouth daily.     No current facility-administered medications for this visit.    Physical Exam BP (!) 160/80   Pulse (!) 55   Resp 18   Ht 5' 8 (1.727 m)   Wt 178 lb (80.7 kg)   SpO2 97%   BMI  27.55 kg/m  78 year old man in no acute distress Alert and oriented x 3 with no focal deficits Lungs diminished breath sounds at left base but otherwise clear Cardiac regular rate and rhythm, systolic murmur No peripheral edema  Diagnostic Tests: I personally reviewed his chest x-ray images.  Left pleural effusion improved compared to chest x-ray from 12/08/2023, slightly increased compared to postthoracentesis film on 12/16/2023.  Impression: Chad [name] is a 78 year old man with a history of aortic stenosis, CAD, AVR CABG, diabetes, hypertension, hyperlipidemia, polyneuropathy, hypothyroidism, and reflux. Referred for management of a postoperative left pleural effusion.  Left pleural effusion-near complete resolution after thoracentesis about 3 weeks ago.  Now has some reaccumulation.  Still better than it was prior to thoracentesis.  He has not been on Lasix  and has renal insufficiency with his most recent  creatinine being 2.9.  Will give another prednisone  taper.  He tolerated the last one without any significant issues.  Plan: Prednisone  taper Return in 1 month with PA and lateral chest x-ray  I spent 20 minutes in review of records, images, and a consultation with Chad [name] today Zelphia Higashi, MD [facility] [phone]

## 2024-01-11 NOTE — Telephone Encounter (Signed)
Sent to patient's pharmacy as requested

## 2024-01-12 ENCOUNTER — Encounter (HOSPITAL_COMMUNITY)
Admission: RE | Admit: 2024-01-12 | Discharge: 2024-01-12 | Disposition: A | Discharge status: Home / Self Care | Source: Ambulatory Visit | Attending: Cardiovascular Disease | Admitting: Cardiovascular Disease

## 2024-01-12 DIAGNOSIS — Z951 Presence of aortocoronary bypass graft: Secondary | ICD-10-CM | POA: Diagnosis not present

## 2024-01-12 DIAGNOSIS — Z952 Presence of prosthetic heart valve: Secondary | ICD-10-CM

## 2024-01-12 NOTE — Progress Notes (Signed)
 Discharge Progress Report  Patient Details  Name: LAVONTAY KIRK MRN: [id number] Date of Birth: [birth year] Referring Provider:   Flowsheet Row INTENSIVE CARDIAC REHAB ORIENT from 10/21/2023 in [facility]  Referring Provider Dr. Maude Emmer, MD     Number of Visits: 58  Reason for Discharge:  Patient reached a stable level of exercise. Patient independent in their exercise. Patient has met program and personal goals.  Smoking History:  Social History   Tobacco Use  Smoking Status Former  Smokeless Tobacco Never    Diagnosis:  S/P CABG x 2 at [facility]  S/P AVR (aortic valve replacement) at [facility]  ADL [facility]:   Initial Exercise Prescription:  Initial Exercise Prescription - 10/21/23 1300       Date of Initial Exercise RX and Referring Provider   Date 10/21/23    Referring Provider Dr. Maude Emmer, MD    Expected Discharge Date 01/12/24      NuStep   Level 1    SPM 60    Minutes 20    METs 1.5      Prescription Details   Frequency (times per week) 3    Duration Progress to 30 minutes of continuous aerobic without signs/symptoms of physical distress      Intensity   THRR 40-80% of Max Heartrate 58-115    Ratings of Perceived Exertion 11-13    Perceived Dyspnea 0-4      Progression   Progression Continue progressive overload as per policy without signs/symptoms or physical distress.      Resistance Training   Training Prescription Yes    Weight 3    Reps 10-15          Discharge Exercise Prescription (Final Exercise Prescription Changes):  Exercise Prescription Changes - 01/12/24 1645       Response to Exercise   Blood Pressure (Admit) 122/66    Blood Pressure (Exit) 122/74    Heart Rate (Admit) 67 bpm    Heart Rate (Exercise) 81 bpm    Heart Rate (Exit) 62 bpm    Rating of Perceived Exertion (Exercise) 11    Perceived Dyspnea (Exercise) 0    Symptoms 0    Comments Pt graduated teh [facility]  ICR program    Duration Progress to 30 minutes of  aerobic without signs/symptoms of physical distress    Intensity THRR unchanged      Progression   Progression Continue to progress workloads to maintain intensity without signs/symptoms of physical distress.    Average METs 2.45      Resistance Training   Training Prescription No    Weight 3    Reps 10-15    Time 10 Minutes      Recumbant Bike   Level 2    RPM 74    Watts 20    Minutes 15    METs 2.4      NuStep   Level 3    SPM 95    Minutes 15    METs 2.5      Home Exercise Plan   Plans to continue exercise at Home (comment)    Frequency Add 2 additional days to program exercise sessions.    Initial Home Exercises Provided 11/12/23          Functional Capacity:  6 Minute Walk     Row Name 10/21/23 1533 01/10/24 1620       6 Minute Walk   Phase Initial  Nustep test Discharge    Distance 1279 feet 1837 feet    Distance % Change -- 43.63 %    Distance Feet Change -- 558 ft    Walk Time 6 minutes 6 minutes    # of Rest Breaks 0 0    MPH 2.42 3.48    METS 2.09 3.44    RPE 9 11    Perceived Dyspnea  1 1    VO2 Peak 7.31 12.03    Symptoms Yes (comment) No    Comments No symptoms: 0.39km, 454 total steps, 75Avg spm, 1.6MET, 16 AVG Watts No symptoms: 0.56km, 651 total steps, 106Avg spm, 3. , 48 AVG Watts    Resting HR 51 bpm 57 bpm    Resting BP 118/62 124/68    Resting Oxygen  Saturation  95 % --    Exercise Oxygen  Saturation  during 6 min walk 98 % --    Max Ex. HR 53 bpm 82 bpm    Max Ex. BP 138/72 140/62    2 Minute Post BP 130/60 --       Psychological, QOL, Others - Outcomes: PHQ 2/9:    01/12/2024    4:15 PM 01/06/2024    2:18 PM 10/21/2023    3:39 PM  Depression screen PHQ 2/9  Decreased Interest 0 0 0  Down, Depressed, Hopeless 0 0 0  PHQ - 2 Score 0 0 0  Altered sleeping 1  0  Tired, decreased energy 0  0  Change in appetite 0  0  Feeling bad or failure about yourself  0  0  Trouble  concentrating 0  0  Moving slowly or fidgety/restless 0  0  Suicidal thoughts 0  0  PHQ-9 Score 1  0  Difficult doing work/chores Not difficult at all      Quality of Life:  Quality of Life - 01/10/24 1631       Quality of Life   Select Quality of Life      Quality of Life Scores   Health/Function Post 26.3 %    Socioeconomic Post 27.57 %    Psych/Spiritual Post 27.93 %    Family Post 28.8 %    GLOBAL Post 27.26 %          Personal Goals: Goals established at orientation with interventions provided to work toward goal.  Personal Goals and Risk Factors at Admission - 10/21/23 1317       Core Components/Risk Factors/Patient Goals on Admission   Hypertension Yes    Intervention Provide education on lifestyle modifcations including regular physical activity/exercise, weight management, moderate sodium restriction and increased consumption of fresh fruit, vegetables, and low fat dairy, alcohol moderation, and smoking cessation.;Monitor prescription use compliance.    Expected Outcomes Short Term: Continued assessment and intervention until BP is < 140/86mm HG in hypertensive participants. < 130/79mm HG in hypertensive participants with diabetes, heart failure or chronic kidney disease.;Long Term: Maintenance of blood pressure at goal levels.    Lipids Yes    Intervention Provide education and support for participant on nutrition & aerobic/resistive exercise along with prescribed medications to achieve LDL 70mg , HDL >40mg .    Expected Outcomes Short Term: Participant states understanding of desired cholesterol values and is compliant with medications prescribed. Participant is following exercise prescription and nutrition guidelines.;Long Term: Cholesterol controlled with medications as prescribed, with individualized exercise RX and with personalized nutrition plan. Value goals: LDL < 70mg , HDL > 40 mg.    Personal Goal Other Yes  Personal Goal Pt would like to increase stamina  and get into shape and travel to [place] with his family    Intervention Will continue to monitor pt and progress workloads as tolerated without sign or symptom    Expected Outcomes Pt will achieve his goals and gain strength           Personal Goals Discharge:  Goals and Risk Factor Review     Row Name 10/28/23 1141 11/16/23 1722 12/15/23 0754 01/12/24 0753       Core Components/Risk Factors/Patient Goals Review   Personal Goals Review Weight Management/Obesity;Lipids;Hypertension;Diabetes Weight Management/Obesity;Lipids;Hypertension;Diabetes Weight Management/Obesity;Lipids;Hypertension;Diabetes Weight Management/Obesity;Lipids;Hypertension;Diabetes    Review Jondavid started cardiac rehab on 10/27/23. Daman did well with exercise. Vital signs and CBg's were stable. Devarius is somewhat deconditioned. Billie is off to a good start  with exercise. Vital signs and CBg's remain stable. Kamani has lost 4.3 kg since starting cardiac rehab. Lawernce continues to do well  with exercise. Vital signs and CBg's remain stable. Jahleel has lost 3.6 kg since starting cardiac rehab. Harve continues to do well  with exercise. Vital signs and CBg's remain stable. Larin has lost 3.4 kg since starting cardiac rehab. Flay will complete cardiac rehab on 01/12/24.    Expected Outcomes Riley will continue to participate in cardiac rehab for exercise, nutrtion and lifestyle modifications. Mykel will continue to participate in cardiac rehab for exercise, nutrtion and lifestyle modifications. Daemon will continue to participate in cardiac rehab for exercise, nutrtion and lifestyle modifications. Jamel will continue to  exercise,  follow nutrtion and lifestyle modifications after completion of cardiac rehab.       Exercise Goals and Review:  Exercise Goals     Row Name 10/21/23 1316             Exercise Goals   Increase Physical Activity Yes       Intervention Provide advice, education, support and counseling about  physical activity/exercise needs.;Develop an individualized exercise prescription for aerobic and resistive training based on initial evaluation findings, risk stratification, comorbidities and participant's personal goals.       Expected Outcomes Short Term: Attend rehab on a regular basis to increase amount of physical activity.;Long Term: Exercising regularly at least 3-5 days a week.;Long Term: Add in home exercise to make exercise part of routine and to increase amount of physical activity.       Increase Strength and Stamina Yes       Intervention Provide advice, education, support and counseling about physical activity/exercise needs.;Develop an individualized exercise prescription for aerobic and resistive training based on initial evaluation findings, risk stratification, comorbidities and participant's personal goals.       Expected Outcomes Short Term: Increase workloads from initial exercise prescription for resistance, speed, and METs.;Short Term: Perform resistance training exercises routinely during rehab and add in resistance training at home;Long Term: Improve cardiorespiratory fitness, muscular endurance and strength as measured by increased METs and functional capacity ( )       Able to understand and use rate of perceived exertion (RPE) scale Yes       Intervention Provide education and explanation on how to use RPE scale       Expected Outcomes Short Term: Able to use RPE daily in rehab to express subjective intensity level;Long Term:  Able to use RPE to guide intensity level when exercising independently       Knowledge and understanding of Target Heart Rate Range (THRR) Yes       Intervention Provide  education and explanation of THRR including how the numbers were predicted and where they are located for reference       Expected Outcomes Short Term: Able to state/look up THRR;Long Term: Able to use THRR to govern intensity when exercising independently;Short Term: Able to use  daily as guideline for intensity in rehab       Understanding of Exercise Prescription Yes       Intervention Provide education, explanation, and written materials on patient's individual exercise prescription       Expected Outcomes Short Term: Able to explain program exercise prescription;Long Term: Able to explain home exercise prescription to exercise independently          Exercise Goals Re-Evaluation:  Exercise Goals Re-Evaluation     Row Name 10/27/23 1626 11/12/23 1627 12/10/23 1701 01/12/24 1647       Exercise Goal Re-Evaluation   Exercise Goals Review Increase Physical Activity;Understanding of Exercise Prescription;Increase Strength and Stamina;Knowledge and understanding of Target Heart Rate Range (THRR);Able to understand and use rate of perceived exertion (RPE) scale Increase Physical Activity;Understanding of Exercise Prescription;Increase Strength and Stamina;Knowledge and understanding of Target Heart Rate Range (THRR);Able to understand and use rate of perceived exertion (RPE) scale Increase Physical Activity;Understanding of Exercise Prescription;Increase Strength and Stamina;Knowledge and understanding of Target Heart Rate Range (THRR);Able to understand and use rate of perceived exertion (RPE) scale Increase Physical Activity;Understanding of Exercise Prescription;Increase Strength and Stamina;Knowledge and understanding of Target Heart Rate Range (THRR);Able to understand and use rate of perceived exertion (RPE) scale    Comments Pt first day in the [facility] ICR program. Pt tolerated exercise well with an average MET level of 1.8. Pt is learning his THRR, RPE and ExRx Reviewed MET's, goals and home ExRx. Pt tolerated exercise well with an average MET level of 2.1. Pt is feeling good about his goals and is increasing strength and stamina. He's very happy that hes getting stronger to go on a big family trip to [facility] in Aug. He will add in exercise on his own by walking and using  the stepper at [facility] 1-2 days for about 30 mins a session Reviewed MET's and goals. Pt tolerated exercise well with an average MET level of 2.35. He is feeling good about his goals and feels and increase in stamina, he has moved to two modalities to help him get more leg strength for going to [place] with his family in Aug. He's doing great and increasing MET's Pt graduated the [employer] today.  Pt tolerated exercise well with an average MET level of 2.45. He will continue to exercise on his own by [facility] and walking 5-7 days for 30-45 mins per session. He did great on his post walk test and increased by 536ft for a total of 1819ft. His big goal was to feel better about his strength so he could travel to [place] with his family in august, he has the tickets booked and he is beaming with excitement. We are so proud of his progress    Expected Outcomes Will continue to monitor pt and progress workloads as tolerated without sign or symptoms Will continue to monitor pt and progress workloads as tolerated without sign or symptoms Will continue to monitor pt and progress workloads as tolerated without sign or symptoms Will continue to monitor pt and progress workloads as tolerated without sign or symptoms       Nutrition & Weight - Outcomes:  Pre Biometrics -  10/21/23 1309       Pre Biometrics   Waist Circumference 40 inches    Hip Circumference 43 inches    Waist to Hip Ratio 0.93 %    Triceps Skinfold 7 mm    % Body Fat 24.9 %    Grip Strength 26 kg    Flexibility --   unable to reach   Single Leg Stand --   Assist device. No attempt         Post Biometrics - 01/10/24 1626        Post  Biometrics   Height 5' 8 (1.727 m)    Weight 80.4 kg    Waist Circumference 39 inches    Hip Circumference 42 inches    Waist to Hip Ratio 0.93 %    BMI (Calculated) 26.96    Triceps Skinfold 7 mm    % Body Fat 24 %    Grip Strength 28 kg    Flexibility  --   Unable to reach   Single Leg Stand 3 seconds   No longer needs assist device, did not attempt orientation day         Nutrition:  Nutrition Therapy & Goals - 01/12/24 1523       Nutrition Therapy   Diet Heart Healthy Diet    Drug/Food Interactions Statins/Certain Fruits      Personal Nutrition Goals   Nutrition Goal Patient to identify strategies for reducing cardiovascular risk by attending the [facility] education and nutrition series weekly.   goal in progress.   Personal Goal #2 Patient to improve diet quality by using the plate method as a guide for meal planning to include lean protein/plant protein, fruits, vegetables, whole grains, nonfat dairy as part of a well-balanced diet.   goal in progress.   Comments Goals in progress. Jakeim has medical history of CAD s/p CABG x2, AVR, HTN, hyperlipidemia,DM2,stage IV kidney disease. A1c is well controlled. He is down 7.5# since starting with our program: BMI remains appropriate for age. He has attend the [facility] education/nutrition series regularly. He continues regular follow-up with nephrology. His wife is a good support. Patient will benefit from adherence to nutrition, exercise, and lifestyle modification.      Intervention Plan   Intervention Prescribe, educate and counsel regarding individualized specific dietary modifications aiming towards targeted core components such as weight, hypertension, lipid management, diabetes, heart failure and other comorbidities.;Nutrition handout(s) given to patient.    Expected Outcomes Short Term Goal: Understand basic principles of dietary content, such as calories, fat, sodium, cholesterol and nutrients.;Long Term Goal: Adherence to prescribed nutrition plan.          Nutrition Discharge:   Education Questionnaire Score:  Knowledge Questionnaire Score - 01/10/24 1634       Knowledge Questionnaire Score   Post Score 22/24          Goals reviewed with patient; copy given to  patient.Pt graduates from  Intensive/Traditional cardiac rehab program on 01/12/24  with completion of  63 exercise and education sessions. Pt maintained good attendance and progressed nicely during their participation in rehab as evidenced by increased MET level. Jonpaul increased his distance on his post exercise walk test by 558 feet. Howell has lost 3.4 kg while enrolled in the program.  Medication list reconciled. Repeat  PHQ score- 0 .  Pt has made significant lifestyle changes and should be commended for their success. Sy achieved his goals during cardiac rehab. We are proud of Tryston's progress and  weight loss.  Pt plans to continue [facility] at [facility] 5-7 days a week.Hadassah Elpidio Quan RN BSN

## 2024-01-12 NOTE — Progress Notes (Incomplete)
 Discharge Progress Report  Patient Details  Name: Chad [name] MRN: [id number] Date of Birth: 09-04-1945 Referring Provider:   Flowsheet Row INTENSIVE CARDIAC REHAB ORIENT from 10/21/2023 in [facility]  Referring Provider Dr. Janelle Mediate, MD     Number of Visits: ***  Reason for Discharge:  Patient reached a stable level of exercise. Patient independent in their exercise. Patient has met program and personal goals.  Smoking History:  Social History   Tobacco Use  Smoking Status Former  Smokeless Tobacco Never    Diagnosis:  S/P CABG x 2 at [facility]  S/P AVR (aortic valve replacement) at [facility]  ADL UCSD:   Initial Exercise Prescription:  Initial Exercise Prescription - 10/21/23 1300       Date of Initial Exercise RX and Referring Provider   Date 10/21/23    Referring Provider Dr. Janelle Mediate, MD    Expected Discharge Date 01/12/24      NuStep   Level 1    SPM 60    Minutes 20    METs 1.5      Prescription Details   Frequency (times per week) 3    Duration Progress to 30 minutes of continuous aerobic without signs/symptoms of physical distress      Intensity   THRR 40-80% of Max Heartrate 58-115    Ratings of Perceived Exertion 11-13    Perceived Dyspnea 0-4      Progression   Progression Continue progressive overload as per policy without signs/symptoms or physical distress.      Resistance Training   Training Prescription Yes    Weight 3    Reps 10-15          Discharge Exercise Prescription (Final Exercise Prescription Changes):  Exercise Prescription Changes - 12/22/23 1630       Response to Exercise   Blood Pressure (Admit) 134/50    Blood Pressure (Exit) 112/70    Heart Rate (Admit) 60 bpm    Heart Rate (Exercise) 72 bpm    Heart Rate (Exit) 65 bpm    Rating of Perceived Exertion (Exercise) 11.5    Perceived Dyspnea (Exercise) 0    Symptoms 0    Comments Reviewed MET's     Duration Progress to 30 minutes of  aerobic without signs/symptoms of physical distress    Intensity THRR unchanged      Progression   Progression Continue to progress workloads to maintain intensity without signs/symptoms of physical distress.    Average METs 2.2      Resistance Training   Training Prescription No    Weight 3    Reps 10-15    Time 10 Minutes      Recumbant Bike   Level 2    RPM 67    Watts 19    Minutes 15    METs 1.7      NuStep   Level 3    SPM 95    Minutes 15    METs 2.7      Home Exercise Plan   Plans to continue exercise at Home (comment)    Frequency Add 2 additional days to program exercise sessions.    Initial Home Exercises Provided 11/12/23          Functional Capacity:  6 Minute Walk     Row Name 10/21/23 1533 01/10/24 1620       6 Minute Walk   Phase Initial  Nustep test Discharge  Distance 1279 feet 1837 feet    Distance % Change -- 43.63 %    Distance Feet Change -- 558 ft    Walk Time 6 minutes 6 minutes    # of Rest Breaks 0 0    MPH 2.42 3.48    METS 2.09 3.44    RPE 9 11    Perceived Dyspnea  1 1    VO2 Peak 7.31 12.03    Symptoms Yes (comment) No    Comments No symptoms: 0.39km, 454 total steps, 75Avg spm, 1.6MET, 16 AVG Watts No symptoms: 0.56km, 651 total steps, 106Avg spm, 3. , 48 AVG Watts    Resting HR 51 bpm 57 bpm    Resting BP 118/62 124/68    Resting Oxygen  Saturation  95 % --    Exercise Oxygen  Saturation  during 6 min walk 98 % --    Max Ex. HR 53 bpm 82 bpm    Max Ex. BP 138/72 140/62    2 Minute Post BP 130/60 --       Psychological, QOL, Others - Outcomes: PHQ 2/9:    01/06/2024    2:18 PM 10/21/2023    3:39 PM  Depression screen PHQ 2/9  Decreased Interest 0 0  Down, Depressed, Hopeless 0 0  PHQ - 2 Score 0 0  Altered sleeping  0  Tired, decreased energy  0  Change in appetite  0  Feeling bad or failure about yourself   0  Trouble concentrating  0  Moving slowly or fidgety/restless   0  Suicidal thoughts  0  PHQ-9 Score  0    Quality of Life:  Quality of Life - 01/10/24 1631       Quality of Life   Select Quality of Life      Quality of Life Scores   Health/Function Post 26.3 %    Socioeconomic Post 27.57 %    Psych/Spiritual Post 27.93 %    Family Post 28.8 %    GLOBAL Post 27.26 %          Personal Goals: Goals established at orientation with interventions provided to work toward goal.  Personal Goals and Risk Factors at Admission - 10/21/23 1317       Core Components/Risk Factors/Patient Goals on Admission   Hypertension Yes    Intervention Provide education on lifestyle modifcations including regular physical activity/exercise, weight management, moderate sodium restriction and increased consumption of fresh fruit, vegetables, and low fat dairy, alcohol moderation, and smoking cessation.;Monitor prescription use compliance.    Expected Outcomes Short Term: Continued assessment and intervention until BP is < 140/44mm HG in hypertensive participants. < 130/64mm HG in hypertensive participants with diabetes, heart failure or chronic kidney disease.;Long Term: Maintenance of blood pressure at goal levels.    Lipids Yes    Intervention Provide education and support for participant on nutrition & aerobic/resistive exercise along with prescribed medications to achieve LDL 70mg , HDL >40mg .    Expected Outcomes Short Term: Participant states understanding of desired cholesterol values and is compliant with medications prescribed. Participant is following exercise prescription and nutrition guidelines.;Long Term: Cholesterol controlled with medications as prescribed, with individualized exercise RX and with personalized nutrition plan. Value goals: LDL < 70mg , HDL > 40 mg.    Personal Goal Other Yes    Personal Goal Pt would like to increase stamina and get into shape and travel to [place] with his family    Intervention Will continue to monitor pt and progress  workloads as tolerated without sign or symptom    Expected Outcomes Pt will achieve his goals and gain strength           Personal Goals Discharge:  Goals and Risk Factor Review     Row Name 10/28/23 1141 11/16/23 1722 12/15/23 0754 01/12/24 0753       Core Components/Risk Factors/Patient Goals Review   Personal Goals Review Weight Management/Obesity;Lipids;Hypertension;Diabetes Weight Management/Obesity;Lipids;Hypertension;Diabetes Weight Management/Obesity;Lipids;Hypertension;Diabetes Weight Management/Obesity;Lipids;Hypertension;Diabetes    Review Dent started cardiac rehab on 10/27/23. Hillery did well with exercise. Vital signs and CBg's were stable. Clevon is somewhat deconditioned. Bengie is off to a good start  with exercise. Vital signs and CBg's remain stable. Sheila has lost 4.3 kg since starting cardiac rehab. Jamarquis continues to do well  with exercise. Vital signs and CBg's remain stable. Ladarrious has lost 3.6 kg since starting cardiac rehab. Yuepheng continues to do well  with exercise. Vital signs and CBg's remain stable. Avedis has lost 3.4 kg since starting cardiac rehab. Khaidyn will complete cardiac rehab on 01/12/24.    Expected Outcomes Toshiro will continue to participate in cardiac rehab for exercise, nutrtion and lifestyle modifications. Compton will continue to participate in cardiac rehab for exercise, nutrtion and lifestyle modifications. Mcguire will continue to participate in cardiac rehab for exercise, nutrtion and lifestyle modifications. Demetrus will continue to  exercise,  follow nutrtion and lifestyle modifications after completion of cardiac rehab.       Exercise Goals and Review:  Exercise Goals     Row Name 10/21/23 1316             Exercise Goals   Increase Physical Activity Yes       Intervention Provide advice, education, support and counseling about physical activity/exercise needs.;Develop an individualized exercise prescription for aerobic and resistive training  based on initial evaluation findings, risk stratification, comorbidities and participant's personal goals.       Expected Outcomes Short Term: Attend rehab on a regular basis to increase amount of physical activity.;Long Term: Exercising regularly at least 3-5 days a week.;Long Term: Add in home exercise to make exercise part of routine and to increase amount of physical activity.       Increase Strength and Stamina Yes       Intervention Provide advice, education, support and counseling about physical activity/exercise needs.;Develop an individualized exercise prescription for aerobic and resistive training based on initial evaluation findings, risk stratification, comorbidities and participant's personal goals.       Expected Outcomes Short Term: Increase workloads from initial exercise prescription for resistance, speed, and METs.;Short Term: Perform resistance training exercises routinely during rehab and add in resistance training at home;Long Term: Improve cardiorespiratory fitness, muscular endurance and strength as measured by increased METs and functional capacity ( )       Able to understand and use rate of perceived exertion (RPE) scale Yes       Intervention Provide education and explanation on how to use RPE scale       Expected Outcomes Short Term: Able to use RPE daily in rehab to express subjective intensity level;Long Term:  Able to use RPE to guide intensity level when exercising independently       Knowledge and understanding of Target Heart Rate Range (THRR) Yes       Intervention Provide education and explanation of THRR including how the numbers were predicted and where they are located for reference       Expected Outcomes Short Term: Able to state/look  up THRR;Long Term: Able to use THRR to govern intensity when exercising independently;Short Term: Able to use daily as guideline for intensity in rehab       Understanding of Exercise Prescription Yes       Intervention Provide  education, explanation, and written materials on patient's individual exercise prescription       Expected Outcomes Short Term: Able to explain program exercise prescription;Long Term: Able to explain home exercise prescription to exercise independently          Exercise Goals Re-Evaluation:  Exercise Goals Re-Evaluation     Row Name 10/27/23 1626 11/12/23 1627 12/10/23 1701         Exercise Goal Re-Evaluation   Exercise Goals Review Increase Physical Activity;Understanding of Exercise Prescription;Increase Strength and Stamina;Knowledge and understanding of Target Heart Rate Range (THRR);Able to understand and use rate of perceived exertion (RPE) scale Increase Physical Activity;Understanding of Exercise Prescription;Increase Strength and Stamina;Knowledge and understanding of Target Heart Rate Range (THRR);Able to understand and use rate of perceived exertion (RPE) scale Increase Physical Activity;Understanding of Exercise Prescription;Increase Strength and Stamina;Knowledge and understanding of Target Heart Rate Range (THRR);Able to understand and use rate of perceived exertion (RPE) scale     Comments Pt first day in the [facility] program. Pt tolerated exercise well with an average MET level of 1.8. Pt is learning his THRR, RPE and ExRx Reviewed MET's, goals and home ExRx. Pt tolerated exercise well with an average MET level of 2.1. Pt is feeling good about his goals and is increasing strength and stamina. He's very happy that hes getting stronger to go on a big family trip to [facility] in Aug. He will add in exercise on his own by walking and using the stepper at [facility] 1-2 days for about 30 mins a session Reviewed MET's and goals. Pt tolerated exercise well with an average MET level of 2.35. He is feeling good about his goals and feels and increase in stamina, he has moved to two modalities to help him get more leg strength for going to [place] with his family in Aug. He's doing great  and increasing MET's     Expected Outcomes Will continue to monitor pt and progress workloads as tolerated without sign or symptoms Will continue to monitor pt and progress workloads as tolerated without sign or symptoms Will continue to monitor pt and progress workloads as tolerated without sign or symptoms        Nutrition & Weight - Outcomes:  Pre Biometrics - 10/21/23 1309       Pre Biometrics   Waist Circumference 40 inches    Hip Circumference 43 inches    Waist to Hip Ratio 0.93 %    Triceps Skinfold 7 mm    % Body Fat 24.9 %    Grip Strength 26 kg    Flexibility --   unable to reach   Single Leg Stand --   Assist device. No attempt         Post Biometrics - 01/10/24 1626        Post  Biometrics   Height 5' 8 (1.727 m)    Weight 80.4 kg    Waist Circumference 39 inches    Hip Circumference 42 inches    Waist to Hip Ratio 0.93 %    BMI (Calculated) 26.96    Triceps Skinfold 7 mm    % Body Fat 24 %    Grip Strength 28 kg    Flexibility --  Unable to reach   Single Leg Stand 3 seconds   No longer needs assist device, did not attempt orientation day         Nutrition:  Nutrition Therapy & Goals - 12/31/23 1425       Nutrition Therapy   Diet Heart Healthy Diet    Drug/Food Interactions Statins/Certain Fruits      Personal Nutrition Goals   Nutrition Goal Patient to identify strategies for reducing cardiovascular risk by attending the [facility] education and nutrition series weekly.   goal in progress.   Personal Goal #2 Patient to improve diet quality by using the plate method as a guide for meal planning to include lean protein/plant protein, fruits, vegetables, whole grains, nonfat dairy as part of a well-balanced diet.   goal in progress.   Comments Goals in progress. Jamin has medical history of CAD s/p CABG x2, AVR, HTN, hyperlipidemia, stage IV kidney disease,DM2.  A1c is well controlled. He is down 5.5# since starting with our program: BMI remains  appropriate for age. He continues to attend the [facility] education/nutrition series regularly. He continues regular follow-up with nephrology. His wife is a good support.   Patient will benefit from participation in intensive cardiac rehab for nutrition, exercise, and lifestyle modification.      Intervention Plan   Intervention Prescribe, educate and counsel regarding individualized specific dietary modifications aiming towards targeted core components such as weight, hypertension, lipid management, diabetes, heart failure and other comorbidities.;Nutrition handout(s) given to patient.    Expected Outcomes Short Term Goal: Understand basic principles of dietary content, such as calories, fat, sodium, cholesterol and nutrients.;Long Term Goal: Adherence to prescribed nutrition plan.          Nutrition Discharge:   Education Questionnaire Score:  Knowledge Questionnaire Score - 01/10/24 1634       Knowledge Questionnaire Score   Post Score 22/24          Goals reviewed with patient; copy given to patient.

## 2024-01-12 NOTE — Progress Notes (Signed)
 Cardiac Individual Treatment Plan  Patient Details  Name: Chad [name] MRN: [id number] Date of Birth: [birth year] Referring Provider:   Flowsheet Row INTENSIVE CARDIAC REHAB ORIENT from 10/21/2023 in [facility]  Referring Provider Dr. Janelle Mediate, MD    Initial Encounter Date:  Flowsheet Row INTENSIVE CARDIAC REHAB ORIENT from 10/21/2023 in [facility]  Date 10/21/23    Visit Diagnosis: S/P CABG x 2 at [facility]  S/P AVR (aortic valve replacement) at [facility]  Patient's Home Medications on Admission:  Current Outpatient Medications:    amLODipine  (NORVASC ) 5 MG tablet, Take 1 tablet (5 mg total) by mouth daily., Disp: 30 tablet, Rfl: 1   aspirin  EC 81 MG tablet, Take 81 mg by mouth daily. Swallow whole., Disp: , Rfl:    B-D ULTRAFINE III SHORT PEN 31G X 8 MM MISC, SMARTSIG:1 Each SUB-Q Daily, Disp: , Rfl:    gabapentin  (NEURONTIN ) 300 MG capsule, TAKE 2 CAPSULES BY MOUTH TWICE A DAY, Disp: 360 capsule, Rfl: 0   insulin  glargine (LANTUS ) 100 UNIT/ML injection, Inject 14 Units into the skin at bedtime., Disp: , Rfl:    JARDIANCE  10 MG TABS tablet, Take 10 mg by mouth daily., Disp: , Rfl:    levothyroxine  (SYNTHROID ) 100 MCG tablet, Take 1 tablet (100 mcg total) by mouth daily before breakfast., Disp: 30 tablet, Rfl: 0   metoprolol  succinate (TOPROL  XL) 25 MG 24 hr tablet, Take 0.5 tablets (12.5 mg total) by mouth daily., Disp: 45 tablet, Rfl: 3   ONETOUCH VERIO test strip, , Disp: , Rfl:    polyethylene glycol (MIRALAX  / GLYCOLAX ) 17 g packet, Take 17 g by mouth as needed for moderate constipation., Disp: , Rfl:    predniSONE  (STERAPRED UNI-PAK 21 TAB) 10 MG (21) TBPK tablet, Take 6 tablets (60 mg total) by mouth daily for 1 day, THEN 5 tablets (50 mg total) daily for 1 day, THEN 4 tablets (40 mg total) daily for 1 day, THEN 3 tablets (30 mg total) daily for 1 day, THEN 2 tablets  (20 mg total) daily for 1 day, THEN 1 tablet (10 mg total) daily for 1 day., Disp: 21 tablet, Rfl: 0   rosuvastatin  (CRESTOR ) 20 MG tablet, Take 20 mg by mouth daily., Disp: , Rfl:   Past Medical History: Past Medical History:  Diagnosis Date   Cataract    CKD (chronic kidney disease)    Colon polyps    Diabetes (HCC)    Diabetic polyneuropathy associated with type 2 diabetes mellitus (HCC) 01/15/2017   Diabetic retinopathy (HCC)    GERD (gastroesophageal reflux disease)    Hypercholesterolemia    Hypertension    Hypertensive retinopathy    Hypothyroidism    Polyneuropathy    Proteinuria    Severe aortic stenosis     Tobacco Use: Social History   Tobacco Use  Smoking Status Former  Smokeless Tobacco Never    Labs: Review Flowsheet       Latest Ref Rng & Units 05/21/2023  Labs for ITP Cardiac and Pulmonary Rehab  PH, Arterial 7.35 - 7.45 7.316   PCO2 arterial 32 - 48 mmHg 38.9   Bicarbonate 20.0 - 28.0 mmol/L 20.5  21.0  19.9   TCO2 22 - 32 mmol/L 22  22  21    Acid-base deficit 0.0 - 2.0 mmol/L 6.0  6.0  6.0   O2 Saturation % 61  65  91     Details       Multiple values from one day are sorted in reverse-chronological order         Capillary Blood Glucose: Lab Results  Component Value Date   GLUCAP 212 (H) 01/10/2024   GLUCAP 172 (H) 01/07/2024   GLUCAP 207 (H) 12/31/2023   GLUCAP 201 (H) 12/29/2023   GLUCAP 168 (H) 11/26/2023     Exercise Target Goals: Exercise Program Goal: Individual exercise prescription set using results from initial 6 min walk test and THRR while considering  patient's activity barriers and safety.   Exercise Prescription Goal: Initial exercise prescription builds to 30-45 minutes a day of aerobic activity, 2-3 days per week.  Home exercise guidelines will be given to patient during program as part of exercise prescription that the participant will acknowledge.  Activity Barriers & Risk Stratification:  Activity Barriers &  Cardiac Risk Stratification - 10/21/23 1535       Activity Barriers & Cardiac Risk Stratification   Activity Barriers Assistive Device;Deconditioning;Back Problems;Shortness of Breath;Balance Concerns;History of Falls    Cardiac Risk Stratification High          6 Minute Walk:  6 Minute Walk     Row Name 10/21/23 1533 01/10/24 1620       6 Minute Walk   Phase Initial  Nustep test Discharge    Distance 1279 feet 1837 feet    Distance % Change -- 43.63 %    Distance Feet Change -- 558 ft    Walk Time 6 minutes 6 minutes    # of Rest Breaks 0 0    MPH 2.42 3.48    METS 2.09 3.44    RPE 9 11    Perceived Dyspnea  1 1    VO2 Peak 7.31 12.03    Symptoms Yes (comment) No    Comments No symptoms: 0.39km, 454 total steps, 75Avg spm, 1.6MET, 16 AVG Watts No symptoms: 0.56km, 651 total steps, 106Avg spm, 3. , 48 AVG Watts    Resting HR 51 bpm 57 bpm    Resting BP 118/62 124/68    Resting Oxygen  Saturation  95 % --    Exercise Oxygen  Saturation  during 6 min walk 98 % --    Max Ex. HR 53 bpm 82 bpm    Max Ex. BP 138/72 140/62    2 Minute Post BP 130/60 --       Oxygen  Initial Assessment:   Oxygen  Re-Evaluation:   Oxygen  Discharge (Final Oxygen  Re-Evaluation):   Initial Exercise Prescription:  Initial Exercise Prescription - 10/21/23 1300       Date of Initial Exercise RX and Referring Provider   Date 10/21/23    Referring Provider Dr. Janelle Mediate, MD    Expected Discharge Date 01/12/24      NuStep   Level 1    SPM 60    Minutes 20    METs 1.5      Prescription Details   Frequency (times per week) 3    Duration Progress to 30 minutes of continuous aerobic without signs/symptoms of physical distress      Intensity   THRR 40-80% of Max Heartrate 58-115    Ratings of Perceived Exertion 11-13    Perceived Dyspnea 0-4      Progression   Progression Continue progressive overload as per policy without signs/symptoms or physical distress.      Resistance  Training   Training Prescription Yes    Weight 3  Reps 10-15          Perform Capillary Blood Glucose checks as needed.  Exercise Prescription Changes:   Exercise Prescription Changes     Row Name 10/27/23 1623 11/12/23 1625 12/10/23 1658 12/22/23 1630       Response to Exercise   Blood Pressure (Admit) 122/60 122/70 136/60 134/50    Blood Pressure (Exercise) 142/60 144/76 -- --    Blood Pressure (Exit) 108/60 128/60 132/64 112/70    Heart Rate (Admit) 53 bpm 54 bpm 58 bpm 60 bpm    Heart Rate (Exercise) 61 bpm 93 bpm 94 bpm 72 bpm    Heart Rate (Exit) 55 bpm 51 bpm 67 bpm 65 bpm    Rating of Perceived Exertion (Exercise) 11 11 11  11.5    Perceived Dyspnea (Exercise) 0 0 0 0    Symptoms 0 0 0 0    Comments Pt first day in the [employer] program Reviewed MET's, goals and home ExRx Reviewed MET's and goals Reviewed MET's    Duration Progress to 30 minutes of  aerobic without signs/symptoms of physical distress Progress to 30 minutes of  aerobic without signs/symptoms of physical distress Progress to 30 minutes of  aerobic without signs/symptoms of physical distress Progress to 30 minutes of  aerobic without signs/symptoms of physical distress    Intensity THRR unchanged THRR unchanged THRR unchanged THRR unchanged      Progression   Progression Continue to progress workloads to maintain intensity without signs/symptoms of physical distress. Continue to progress workloads to maintain intensity without signs/symptoms of physical distress. Continue to progress workloads to maintain intensity without signs/symptoms of physical distress. Continue to progress workloads to maintain intensity without signs/symptoms of physical distress.    Average METs 1.8 2.1 2.35 2.2      Resistance Training   Training Prescription No Yes Yes No    Weight 3 3 3 3     Reps 10-15 10-15 10-15 10-15    Time 10 Minutes 10 Minutes 10 Minutes 10 Minutes      Recumbant Bike   Level -- -- 2 2    RPM --  -- 74 67    Watts -- -- 24 19    Minutes -- -- 15 15    METs -- -- 2.4 1.7      NuStep   Level 1 2 2 3     SPM 73 83 90 95    Minutes 20 33 33 15    METs 1.8 2.1 2.3 2.7      Home Exercise Plan   Plans to continue exercise at -- Home (comment) Home (comment) Home (comment)    Frequency -- Add 2 additional days to program exercise sessions. Add 2 additional days to program exercise sessions. Add 2 additional days to program exercise sessions.    Initial Home Exercises Provided -- 11/12/23 11/12/23 11/12/23       Exercise Comments:   Exercise Comments     Row Name 10/27/23 1628 11/12/23 1629 12/10/23 1704 12/22/23 1630 12/23/23 0751   Exercise Comments Pt first day in the [facility] program. Pt tolerated exercise well with an average MET level of 1.8. Pt is learning his THRR, RPE and ExRx Reviewed MET's, goals and home ExRx. Pt tolerated exercise well with an average MET level of 2.1. Pt is feeling good about his goals and is increasing strength and stamina. He's very happy that hes getting stronger to go on a big family trip to [place] in  Aug. He will add in exercise on his own by walking and using the stepper at [facility] 1-2 days for about 30 mins a session Reviewed MET's and goals. Pt tolerated exercise well with an average MET level of 2.35. He is feeling good about his goals and feels and increase in stamina, he has moved to two modalities to help him get more leg strength for going to [place] with his family in Aug. He's doing great and increasing MET's Reviewed MET's. Pt tolerated exercise well with an average MET level of 2.2. He is feeling much better since his thoracentesis. We have only had a few sessions in the 2 weeks since we talked last due to procedure and holiday, no MET progression this time, but he is feeling good with exercise --      Exercise Goals and Review:   Exercise Goals     Row Name 10/21/23 1316             Exercise Goals   Increase Physical  Activity Yes       Intervention Provide advice, education, support and counseling about physical activity/exercise needs.;Develop an individualized exercise prescription for aerobic and resistive training based on initial evaluation findings, risk stratification, comorbidities and participant's personal goals.       Expected Outcomes Short Term: Attend rehab on a regular basis to increase amount of physical activity.;Long Term: Exercising regularly at least 3-5 days a week.;Long Term: Add in home exercise to make exercise part of routine and to increase amount of physical activity.       Increase Strength and Stamina Yes       Intervention Provide advice, education, support and counseling about physical activity/exercise needs.;Develop an individualized exercise prescription for aerobic and resistive training based on initial evaluation findings, risk stratification, comorbidities and participant's personal goals.       Expected Outcomes Short Term: Increase workloads from initial exercise prescription for resistance, speed, and METs.;Short Term: Perform resistance training exercises routinely during rehab and add in resistance training at home;Long Term: Improve cardiorespiratory fitness, muscular endurance and strength as measured by increased METs and functional capacity ( )       Able to understand and use rate of perceived exertion (RPE) scale Yes       Intervention Provide education and explanation on how to use RPE scale       Expected Outcomes Short Term: Able to use RPE daily in rehab to express subjective intensity level;Long Term:  Able to use RPE to guide intensity level when exercising independently       Knowledge and understanding of Target Heart Rate Range (THRR) Yes       Intervention Provide education and explanation of THRR including how the numbers were predicted and where they are located for reference       Expected Outcomes Short Term: Able to state/look up THRR;Long Term: Able  to use THRR to govern intensity when exercising independently;Short Term: Able to use daily as guideline for intensity in rehab       Understanding of Exercise Prescription Yes       Intervention Provide education, explanation, and written materials on patient's individual exercise prescription       Expected Outcomes Short Term: Able to explain program exercise prescription;Long Term: Able to explain home exercise prescription to exercise independently          Exercise Goals Re-Evaluation :  Exercise Goals Re-Evaluation     Row Name 10/27/23 1626 11/12/23 1627  12/10/23 1701         Exercise Goal Re-Evaluation   Exercise Goals Review Increase Physical Activity;Understanding of Exercise Prescription;Increase Strength and Stamina;Knowledge and understanding of Target Heart Rate Range (THRR);Able to understand and use rate of perceived exertion (RPE) scale Increase Physical Activity;Understanding of Exercise Prescription;Increase Strength and Stamina;Knowledge and understanding of Target Heart Rate Range (THRR);Able to understand and use rate of perceived exertion (RPE) scale Increase Physical Activity;Understanding of Exercise Prescription;Increase Strength and Stamina;Knowledge and understanding of Target Heart Rate Range (THRR);Able to understand and use rate of perceived exertion (RPE) scale     Comments Pt first day in the [facility] program. Pt tolerated exercise well with an average MET level of 1.8. Pt is learning his THRR, RPE and ExRx Reviewed MET's, goals and home ExRx. Pt tolerated exercise well with an average MET level of 2.1. Pt is feeling good about his goals and is increasing strength and stamina. He's very happy that hes getting stronger to go on a big family trip to [facility] in Aug. He will add in exercise on his own by walking and using the stepper at [facility] 1-2 days for about 30 mins a session Reviewed MET's and goals. Pt tolerated exercise well with an average MET level  of 2.35. He is feeling good about his goals and feels and increase in stamina, he has moved to two modalities to help him get more leg strength for going to [place] with his family in Aug. He's doing great and increasing MET's     Expected Outcomes Will continue to monitor pt and progress workloads as tolerated without sign or symptoms Will continue to monitor pt and progress workloads as tolerated without sign or symptoms Will continue to monitor pt and progress workloads as tolerated without sign or symptoms        Discharge Exercise Prescription (Final Exercise Prescription Changes):  Exercise Prescription Changes - 12/22/23 1630       Response to Exercise   Blood Pressure (Admit) 134/50    Blood Pressure (Exit) 112/70    Heart Rate (Admit) 60 bpm    Heart Rate (Exercise) 72 bpm    Heart Rate (Exit) 65 bpm    Rating of Perceived Exertion (Exercise) 11.5    Perceived Dyspnea (Exercise) 0    Symptoms 0    Comments Reviewed MET's    Duration Progress to 30 minutes of  aerobic without signs/symptoms of physical distress    Intensity THRR unchanged      Progression   Progression Continue to progress workloads to maintain intensity without signs/symptoms of physical distress.    Average METs 2.2      Resistance Training   Training Prescription No    Weight 3    Reps 10-15    Time 10 Minutes      Recumbant Bike   Level 2    RPM 67    Watts 19    Minutes 15    METs 1.7      NuStep   Level 3    SPM 95    Minutes 15    METs 2.7      Home Exercise Plan   Plans to continue exercise at Home (comment)    Frequency Add 2 additional days to program exercise sessions.    Initial Home Exercises Provided 11/12/23          Nutrition:  Target Goals: Understanding of nutrition guidelines, daily intake of sodium 1500mg , cholesterol 200mg , calories 30%  from fat and 7% or less from saturated fats, daily to have 5 or more servings of fruits and vegetables.  Biometrics:  Pre  Biometrics - 10/21/23 1309       Pre Biometrics   Waist Circumference 40 inches    Hip Circumference 43 inches    Waist to Hip Ratio 0.93 %    Triceps Skinfold 7 mm    % Body Fat 24.9 %    Grip Strength 26 kg    Flexibility --   unable to reach   Single Leg Stand --   Assist device. No attempt         Post Biometrics - 01/10/24 1626        Post  Biometrics   Height 5' 8 (1.727 m)    Weight 80.4 kg    Waist Circumference 39 inches    Hip Circumference 42 inches    Waist to Hip Ratio 0.93 %    BMI (Calculated) 26.96    Triceps Skinfold 7 mm    % Body Fat 24 %    Grip Strength 28 kg    Flexibility --   Unable to reach   Single Leg Stand 3 seconds   No longer needs assist device, did not attempt orientation day         Nutrition Therapy Plan and Nutrition Goals:  Nutrition Therapy & Goals - 12/31/23 1425       Nutrition Therapy   Diet Heart Healthy Diet    Drug/Food Interactions Statins/Certain Fruits      Personal Nutrition Goals   Nutrition Goal Patient to identify strategies for reducing cardiovascular risk by attending the [facility] education and nutrition series weekly.   goal in progress.   Personal Goal #2 Patient to improve diet quality by using the plate method as a guide for meal planning to include lean protein/plant protein, fruits, vegetables, whole grains, nonfat dairy as part of a well-balanced diet.   goal in progress.   Comments Goals in progress. Lenton has medical history of CAD s/p CABG x2, AVR, HTN, hyperlipidemia, stage IV kidney disease,DM2.  A1c is well controlled. He is down 5.5# since starting with our program: BMI remains appropriate for age. He continues to attend the [facility] education/nutrition series regularly. He continues regular follow-up with nephrology. His wife is a good support.   Patient will benefit from participation in intensive cardiac rehab for nutrition, exercise, and lifestyle modification.      Intervention Plan    Intervention Prescribe, educate and counsel regarding individualized specific dietary modifications aiming towards targeted core components such as weight, hypertension, lipid management, diabetes, heart failure and other comorbidities.;Nutrition handout(s) given to patient.    Expected Outcomes Short Term Goal: Understand basic principles of dietary content, such as calories, fat, sodium, cholesterol and nutrients.;Long Term Goal: Adherence to prescribed nutrition plan.          Nutrition Assessments:  MEDIFICTS Score Key: >=70 Need to make dietary changes  40-70 Heart Healthy Diet <= 40 Therapeutic Level Cholesterol Diet   Flowsheet Row INTENSIVE CARDIAC REHAB from 01/10/2024 in [facility] for [facility], [facility], & Lung [facility]  Picture Your Plate Total Score on Discharge 70   Picture Your Plate Scores: <86 Unhealthy dietary pattern with much room for improvement. 41-50 Dietary pattern unlikely to meet recommendations for good health and room for improvement. 51-60 More healthful dietary pattern, with some room for improvement.  >60 Healthy dietary pattern, although there may be some specific  behaviors that could be improved.    Nutrition Goals Re-Evaluation:  Nutrition Goals Re-Evaluation     Row Name 10/27/23 1543 11/26/23 1552 12/31/23 1425         Goals   Current Weight 184 lb 11.9 oz (83.8 kg) 178 lb 2.1 oz (80.8 kg) 179 lb 3.7 oz (81.3 kg)     Comment Cr 2.235, GFR 17, A1c 6.0, unable to review most recent lipid panel. no new labs; most recent labs  Cr 2.235, GFR 17, A1c 6.0, unable to review most recent lipid panel. no new labs; most recent labs Cr 2.235, GFR 17, A1c 6.0, HDL 48, LDL 84     Expected Outcome Kyri has medical history of CAD s/p CABG x2, AVR, HTN, hyperlipidemia, stage IV kidney disease,DM2. A1c is well controlled. He continues regular follow-up with nephrology. His wife is a good support. Patient will benefit from participation in  intensive cardiac rehab for nutrition, exercise, and lifestyle modification. Goals in progress. Billey has medical history of CAD s/p CABG x2, AVR, HTN, hyperlipidemia, stage IV kidney disease,DM2. A1c is well controlled. He is down 6.6# since starting with our program. He continues to attend the [facility] education/nutrition series regularly. He continues regular follow-up with nephrology. His wife is a good support. Patient will benefit from participation in intensive cardiac rehab for nutrition, exercise, and lifestyle modification. Goals in progress. Calyn has medical history of CAD s/p CABG x2, AVR, HTN, hyperlipidemia, stage IV kidney disease,DM2. A1c is well controlled. He is down 5.5# since starting with our program: BMI remains appropriate for age. He continues to attend the [facility] education/nutrition series regularly. He continues regular follow-up with nephrology. His wife is a good support. Patient will benefit from participation in intensive cardiac rehab for nutrition, exercise, and lifestyle modification.        Nutrition Goals Re-Evaluation:  Nutrition Goals Re-Evaluation     Row Name 10/27/23 1543 11/26/23 1552 12/31/23 1425         Goals   Current Weight 184 lb 11.9 oz (83.8 kg) 178 lb 2.1 oz (80.8 kg) 179 lb 3.7 oz (81.3 kg)     Comment Cr 2.235, GFR 17, A1c 6.0, unable to review most recent lipid panel. no new labs; most recent labs  Cr 2.235, GFR 17, A1c 6.0, unable to review most recent lipid panel. no new labs; most recent labs Cr 2.235, GFR 17, A1c 6.0, HDL 48, LDL 84     Expected Outcome Valen has medical history of CAD s/p CABG x2, AVR, HTN, hyperlipidemia, stage IV kidney disease,DM2. A1c is well controlled. He continues regular follow-up with nephrology. His wife is a good support. Patient will benefit from participation in intensive cardiac rehab for nutrition, exercise, and lifestyle modification. Goals in progress. Younes has medical history of CAD s/p CABG x2, AVR, HTN,  hyperlipidemia, stage IV kidney disease,DM2. A1c is well controlled. He is down 6.6# since starting with our program. He continues to attend the [facility] education/nutrition series regularly. He continues regular follow-up with nephrology. His wife is a good support. Patient will benefit from participation in intensive cardiac rehab for nutrition, exercise, and lifestyle modification. Goals in progress. Webb has medical history of CAD s/p CABG x2, AVR, HTN, hyperlipidemia, stage IV kidney disease,DM2. A1c is well controlled. He is down 5.5# since starting with our program: BMI remains appropriate for age. He continues to attend the [facility] education/nutrition series regularly. He continues regular follow-up with nephrology. His wife is a good support. Patient will  benefit from participation in intensive cardiac rehab for nutrition, exercise, and lifestyle modification.        Nutrition Goals Discharge (Final Nutrition Goals Re-Evaluation):  Nutrition Goals Re-Evaluation - 12/31/23 1425       Goals   Current Weight 179 lb 3.7 oz (81.3 kg)    Comment no new labs; most recent labs Cr 2.235, GFR 17, A1c 6.0, HDL 48, LDL 84    Expected Outcome Goals in progress. Arrick has medical history of CAD s/p CABG x2, AVR, HTN, hyperlipidemia, stage IV kidney disease,DM2. A1c is well controlled. He is down 5.5# since starting with our program: BMI remains appropriate for age. He continues to attend the [facility] education/nutrition series regularly. He continues regular follow-up with nephrology. His wife is a good support. Patient will benefit from participation in intensive cardiac rehab for nutrition, exercise, and lifestyle modification.          Psychosocial: Target Goals: Acknowledge presence or absence of significant depression and/or stress, maximize coping skills, provide positive support system. Participant is able to verbalize types and ability to use techniques and skills needed for reducing stress  and depression.  Initial Review & Psychosocial Screening:  Initial Psych Review & Screening - 10/21/23 1356       Initial Review   Current issues with None Identified      Family Dynamics   Good Support System? Yes   Keghan has his wife two children and grandchildren for support     Barriers   Psychosocial barriers to participate in program There are no identifiable barriers or psychosocial needs.      Screening Interventions   Interventions Encouraged to exercise          Quality of Life Scores:  Quality of Life - 01/10/24 1631       Quality of Life   Select Quality of Life      Quality of Life Scores   Health/Function Post 26.3 %    Socioeconomic Post 27.57 %    Psych/Spiritual Post 27.93 %    Family Post 28.8 %    GLOBAL Post 27.26 %         Scores of 19 and below usually indicate a poorer quality of life in these areas.  A difference of  2-3 points is a clinically meaningful difference.  A difference of 2-3 points in the total score of the Quality of Life Index has been associated with significant improvement in overall quality of life, self-image, physical symptoms, and general health in studies assessing change in quality of life.  PHQ-9: Review Flowsheet       01/06/2024 10/21/2023  Depression screen PHQ 2/9  Decreased Interest 0 0  Down, Depressed, Hopeless 0 0  PHQ - 2 Score 0 0  Altered sleeping - 0  Tired, decreased energy - 0  Change in appetite - 0  Feeling bad or failure about yourself  - 0  Trouble concentrating - 0  Moving slowly or fidgety/restless - 0  Suicidal thoughts - 0  PHQ-9 Score - 0   Interpretation of Total Score  Total Score Depression Severity:  1-4 = Minimal depression, 5-9 = Mild depression, 10-14 = Moderate depression, 15-19 = Moderately severe depression, 20-27 = Severe depression   Psychosocial Evaluation and Intervention:   Psychosocial Re-Evaluation:  Psychosocial Re-Evaluation     Row Name 10/28/23 1138 11/16/23  1720 12/15/23 0753 01/12/24 1610       Psychosocial Re-Evaluation   Current issues with None Identified  None Identified None Identified None Identified    Interventions Encouraged to attend Cardiac Rehabilitation for the exercise Encouraged to attend Cardiac Rehabilitation for the exercise Encouraged to attend Cardiac Rehabilitation for the exercise Encouraged to attend Cardiac Rehabilitation for the exercise    Continue Psychosocial Services  No Follow up required No Follow up required No Follow up required No Follow up required       Psychosocial Discharge (Final Psychosocial Re-Evaluation):  Psychosocial Re-Evaluation - 01/12/24 0752       Psychosocial Re-Evaluation   Current issues with None Identified    Interventions Encouraged to attend Cardiac Rehabilitation for the exercise    Continue Psychosocial Services  No Follow up required          Vocational Rehabilitation: Provide vocational rehab assistance to qualifying candidates.   Vocational Rehab Evaluation & Intervention:  Vocational Rehab - 10/21/23 1357       Initial Vocational Rehab Evaluation & Intervention   Assessment shows need for Vocational Rehabilitation No   Terreon is retired and does not need vocational rehab at this time         Education: Education Goals: Education classes will be provided on a weekly basis, covering required topics. Participant will state understanding/return demonstration of topics presented.    Education     Row Name 10/27/23 1400     Education   Cardiac Education Topics Pritikin   Customer service manager   Weekly Topic Fast Evening Meals   Instruction Review Code 1- Verbalizes Understanding   Class Start Time 1400   Class Stop Time 1440   Class Time Calculation (min) 40 min    Row Name 10/29/23 1400     Education   Cardiac Education Topics Pritikin   Licensed conveyancer Nutrition    Nutrition Vitamins and Minerals   Instruction Review Code 1- Verbalizes Understanding   Class Start Time 1400   Class Stop Time 1440   Class Time Calculation (min) 40 min    Row Name 11/01/23 1500     Education   Cardiac Education Topics Pritikin   Glass blower/designer Nutrition   Nutrition Workshop Fueling a Forensic psychologist   Instruction Review Code 1- Tax inspector   Class Start Time 1400   Class Stop Time 1440   Class Time Calculation (min) 40 min    Row Name 11/05/23 1400     Education   Cardiac Education Topics Pritikin   Psychologist, forensic Exercise Education   Exercise Education Improving Performance   Instruction Review Code 1- Verbalizes Understanding   Class Start Time 1400   Class Stop Time 1435   Class Time Calculation (min) 35 min    Row Name 11/08/23 1700     Education   Cardiac Education Topics Pritikin   Select Workshops     Workshops   Educator Exercise Physiologist   Select Psychosocial   Psychosocial Workshop Healthy Sleep for a Healthy Heart   Instruction Review Code 1- Verbalizes Understanding   Class Start Time 1403   Class Stop Time 1457   Class Time Calculation (min) 54 min    Row Name 11/12/23 1500     Education   Cardiac Education Topics Pritikin  Select Core Videos     Core Videos   Educator Exercise Physiologist   Select Psychosocial   Psychosocial How Our Thoughts Can Heal Our Hearts   Instruction Review Code 1- Verbalizes Understanding   Class Start Time 1400   Class Stop Time 1440   Class Time Calculation (min) 40 min    Row Name 11/15/23 1500     Education   Cardiac Education Topics Pritikin   Select Workshops     Workshops   Educator Exercise Physiologist   Select Exercise   Exercise Workshop Managing Heart Disease: Your Path to a Healthier Heart   Instruction Review Code 1- Verbalizes Understanding    Class Start Time 1400   Class Stop Time 1448   Class Time Calculation (min) 48 min    Row Name 11/17/23 1500     Education   Cardiac Education Topics Pritikin   Customer service manager   Weekly Topic Powerhouse Plant-Based Proteins   Instruction Review Code 1- Verbalizes Understanding   Class Start Time 1400   Class Stop Time 1435   Class Time Calculation (min) 35 min    Row Name 11/19/23 1500     Education   Cardiac Education Topics Pritikin   Hospital doctor Education   General Education Hypertension and Heart Disease   Instruction Review Code 1- Verbalizes Understanding   Class Start Time 1350   Class Stop Time 1433   Class Time Calculation (min) 43 min    Row Name 11/22/23 1600     Education   Cardiac Education Topics Pritikin   Geographical information systems officer Psychosocial   Psychosocial Workshop Focused Goals, Sustainable Changes   Instruction Review Code 1- Verbalizes Understanding   Class Start Time 1400   Class Stop Time 1445   Class Time Calculation (min) 45 min    Row Name 11/24/23 1600     Education   Cardiac Education Topics Pritikin   Customer service manager   Weekly Topic Tasty Appetizers and Snacks   Instruction Review Code 1- Verbalizes Understanding   Class Start Time 1400   Class Stop Time 1445   Class Time Calculation (min) 45 min    Row Name 11/26/23 1400     Education   Cardiac Education Topics Pritikin   Hospital doctor Education   General Education Heart Disease Risk Reduction   Instruction Review Code 1- Verbalizes Understanding   Class Start Time 1400   Class Stop Time 1445   Class Time Calculation (min) 45 min    Row Name 12/01/23 1500     Education   Cardiac  Education Topics Pritikin   Customer service manager   Weekly Topic Adding Flavor - Sodium-Free   Instruction Review Code 1- Verbalizes Understanding   Class Start Time 1400   Class Stop Time 1440   Class Time Calculation (min) 40 min    Row Name 12/03/23 1500     Education   Cardiac Education Topics Pritikin   Geographical information systems officer Exercise  Exercise Workshop Location manager and Fall Prevention   Instruction Review Code 1- Verbalizes Understanding   Class Start Time 1407   Class Stop Time 1447   Class Time Calculation (min) 40 min    Row Name 12/06/23 1500     Education   Cardiac Education Topics Pritikin   Glass blower/designer Nutrition   Nutrition Workshop Label Reading   Instruction Review Code 1- Verbalizes Understanding   Class Start Time 1400   Class Stop Time 1437   Class Time Calculation (min) 37 min    Row Name 12/08/23 1400     Education   Cardiac Education Topics Pritikin   Nurse, children's Exercise Physiologist   Select Psychosocial   Nutrition Other  Label reading   Instruction Review Code 1- Verbalizes Understanding   Class Start Time 1350   Class Stop Time 1435   Class Time Calculation (min) 45 min    Row Name 12/10/23 1500     Education   Cardiac Education Topics Pritikin   Customer service manager   Weekly Topic Fast and Healthy Breakfasts   Instruction Review Code 1- Verbalizes Understanding   Class Start Time 1400   Class Stop Time 1440   Class Time Calculation (min) 40 min    Row Name 12/13/23 1400     Education   Cardiac Education Topics Pritikin   Hospital doctor Education   General Education Metabolic Syndrome and Belly Fat   Instruction Review Code 1-  Verbalizes Understanding   Class Start Time 1355   Class Stop Time 1440   Class Time Calculation (min) 45 min    Row Name 12/15/23 1400     Education   Cardiac Education Topics Pritikin   Customer service manager   Weekly Topic Personalizing Your Pritikin Plate   Instruction Review Code 1- Verbalizes Understanding   Class Start Time 1355   Class Stop Time 1430   Class Time Calculation (min) 35 min    Row Name 12/17/23 1500     Education   Cardiac Education Topics Pritikin   Nurse, children's Exercise Physiologist   Select Nutrition   Nutrition Overview of the Pritikin Eating Plan   Instruction Review Code 1- Verbalizes Understanding   Class Start Time 1350   Class Stop Time 1435   Class Time Calculation (min) 45 min    Row Name 12/22/23 1500     Education   Cardiac Education Topics Pritikin   Orthoptist   Educator Dietitian   Weekly Topic Delicious Desserts   Instruction Review Code 1- Verbalizes Understanding   Class Start Time 1400   Class Stop Time 1437   Class Time Calculation (min) 37 min    Row Name 12/24/23 1500     Education   Cardiac Education Topics Pritikin   Nurse, children's   Educator Dietitian   Select Nutrition   Nutrition Calorie Density   Instruction Review Code 1- Verbalizes Understanding   Class Start Time 1400   Class Stop Time 1440   Class Time  Calculation (min) 40 min    Row Name 12/27/23 1600     Education   Cardiac Education Topics Pritikin   Geographical information systems officer Exercise   Exercise Workshop Exercise Basics: Diplomatic Services operational officer   Instruction Review Code 1- Verbalizes Understanding   Class Start Time 1400   Class Stop Time 1450   Class Time Calculation (min) 50 min    Row Name 12/29/23 1400     Education   Cardiac Education Topics Pritikin    Customer service manager   Weekly Topic Efficiency Cooking - Meals in a Snap   Instruction Review Code 1- Verbalizes Understanding   Class Start Time 1400   Class Stop Time 1442   Class Time Calculation (min) 42 min    Row Name 12/31/23 1400     Education   Cardiac Education Topics Pritikin   Psychologist, forensic Exercise Education   Exercise Education Move It!   Instruction Review Code 1- Verbalizes Understanding   Class Start Time 1402   Class Stop Time 1437   Class Time Calculation (min) 35 min    Row Name 01/03/24 1400     Education   Cardiac Education Topics Pritikin   Psychologist, forensic General Education   General Education Hypertension and Heart Disease   Instruction Review Code 1- Verbalizes Understanding   Class Start Time 1355   Class Stop Time 1443   Class Time Calculation (min) 48 min    Row Name 01/05/24 1500     Education   Cardiac Education Topics Pritikin   Customer service manager   Weekly Topic One-Pot Wonders   Instruction Review Code 1- Verbalizes Understanding   Class Start Time 1400   Class Stop Time 1445   Class Time Calculation (min) 45 min    Row Name 01/07/24 1500     Education   Cardiac Education Topics Pritikin   Glass blower/designer Nutrition   Nutrition Workshop Targeting Your Nutrition Priorities   Instruction Review Code 1- Verbalizes Understanding   Class Start Time 1400   Class Stop Time 1440   Class Time Calculation (min) 40 min    Row Name 01/10/24 1500     Education   Cardiac Education Topics Pritikin   Geographical information systems officer Psychosocial   Psychosocial Workshop Focused Goals, Sustainable Changes   Instruction Review Code 1-  Verbalizes Understanding   Class Start Time 1400   Class Stop Time 1439   Class Time Calculation (min) 39 min      Core Videos: Exercise    Move It!  Clinical staff conducted group or individual video education with verbal and written material and guidebook.  Patient learns the recommended Pritikin exercise program. Exercise with the goal of living a long, healthy life. Some of the health benefits of exercise include controlled diabetes, healthier blood pressure levels, improved cholesterol levels, improved heart and lung capacity, improved sleep, and better body composition. Everyone should speak with their doctor before starting or changing an exercise routine.  Biomechanical  Limitations Clinical staff conducted group or individual video education with verbal and written material and guidebook.  Patient learns how biomechanical limitations can impact exercise and how we can mitigate and possibly overcome limitations to have an impactful and balanced exercise routine.  Body Composition Clinical staff conducted group or individual video education with verbal and written material and guidebook.  Patient learns that body composition (ratio of muscle mass to fat mass) is a key component to assessing overall fitness, rather than body weight alone. Increased fat mass, especially visceral belly fat, can put us  at increased risk for metabolic syndrome, type 2 diabetes, heart disease, and even death. It is recommended to combine diet and exercise (cardiovascular and resistance training) to improve your body composition. Seek guidance from your physician and exercise physiologist before implementing an exercise routine.  Exercise Action Plan Clinical staff conducted group or individual video education with verbal and written material and guidebook.  Patient learns the recommended strategies to achieve and enjoy long-term exercise adherence, including variety, self-motivation, self-efficacy, and  positive decision making. Benefits of exercise include fitness, good health, weight management, more energy, better sleep, less stress, and overall well-being.  Medical   Heart Disease Risk Reduction Clinical staff conducted group or individual video education with verbal and written material and guidebook.  Patient learns our heart is our most vital organ as it circulates oxygen , nutrients, white blood cells, and hormones throughout the entire body, and carries waste away. Data supports a plant-based eating plan like the Pritikin Program for its effectiveness in slowing progression of and reversing heart disease. The video provides a number of recommendations to address heart disease.   Metabolic Syndrome and Belly Fat  Clinical staff conducted group or individual video education with verbal and written material and guidebook.  Patient learns what metabolic syndrome is, how it leads to heart disease, and how one can reverse it and keep it from coming back. You have metabolic syndrome if you have 3 of the following 5 criteria: abdominal obesity, high blood pressure, high triglycerides, low HDL cholesterol, and high blood sugar.  Hypertension and Heart Disease Clinical staff conducted group or individual video education with verbal and written material and guidebook.  Patient learns that high blood pressure, or hypertension, is very common in the [place] . Hypertension is largely due to excessive salt intake, but other important risk factors include being overweight, physical inactivity, drinking too much alcohol, smoking, and not eating enough potassium from fruits and vegetables. High blood pressure is a leading risk factor for heart attack, stroke, congestive heart failure, dementia, kidney failure, and premature death. Long-term effects of excessive salt intake include stiffening of the arteries and thickening of heart muscle and organ damage. Recommendations include ways to reduce hypertension  and the risk of heart disease.  Diseases of Our Time - Focusing on Diabetes Clinical staff conducted group or individual video education with verbal and written material and guidebook.  Patient learns why the best way to stop diseases of our time is prevention, through food and other lifestyle changes. Medicine (such as prescription pills and surgeries) is often only a Band-Aid on the problem, not a long-term solution. Most common diseases of our time include obesity, type 2 diabetes, hypertension, heart disease, and cancer. The Pritikin Program is recommended and has been proven to help reduce, reverse, and/or prevent the damaging effects of metabolic syndrome.  Nutrition   Overview of the Pritikin Eating Plan  Clinical staff conducted group or individual video education with verbal  and written material and guidebook.  Patient learns about the Pritikin Eating Plan for disease risk reduction. The Pritikin Eating Plan emphasizes a wide variety of unrefined, minimally-processed carbohydrates, like fruits, vegetables, whole grains, and legumes. Go, Caution, and Stop food choices are explained. Plant-based and lean animal proteins are emphasized. Rationale provided for low sodium intake for blood pressure control, low added sugars for blood sugar stabilization, and low added fats and oils for coronary artery disease risk reduction and weight management.  Calorie Density  Clinical staff conducted group or individual video education with verbal and written material and guidebook.  Patient learns about calorie density and how it impacts the Pritikin Eating Plan. Knowing the characteristics of the food you choose will help you decide whether those foods will lead to weight gain or weight loss, and whether you want to consume more or less of them. Weight loss is usually a side effect of the Pritikin Eating Plan because of its focus on low calorie-dense foods.  Label Reading  Clinical staff conducted group or  individual video education with verbal and written material and guidebook.  Patient learns about the Pritikin recommended label reading guidelines and corresponding recommendations regarding calorie density, added sugars, sodium content, and whole grains.  Dining Out - Part 1  Clinical staff conducted group or individual video education with verbal and written material and guidebook.  Patient learns that restaurant meals can be sabotaging because they can be so high in calories, fat, sodium, and/or sugar. Patient learns recommended strategies on how to positively address this and avoid unhealthy pitfalls.  Facts on Fats  Clinical staff conducted group or individual video education with verbal and written material and guidebook.  Patient learns that lifestyle modifications can be just as effective, if not more so, as many medications for lowering your risk of heart disease. A Pritikin lifestyle can help to reduce your risk of inflammation and atherosclerosis (cholesterol build-up, or plaque, in the artery walls). Lifestyle interventions such as dietary choices and physical activity address the cause of atherosclerosis. A review of the types of fats and their impact on blood cholesterol levels, along with dietary recommendations to reduce fat intake is also included.  Nutrition Action Plan  Clinical staff conducted group or individual video education with verbal and written material and guidebook.  Patient learns how to incorporate Pritikin recommendations into their lifestyle. Recommendations include planning and keeping personal health goals in mind as an important part of their success.  Healthy Mind-Set    Healthy Minds, Bodies, Hearts  Clinical staff conducted group or individual video education with verbal and written material and guidebook.  Patient learns how to identify when they are stressed. Video will discuss the impact of that stress, as well as the many benefits of stress management.  Patient will also be introduced to stress management techniques. The way we think, act, and feel has an impact on our hearts.  How Our Thoughts Can Heal Our Hearts  Clinical staff conducted group or individual video education with verbal and written material and guidebook.  Patient learns that negative thoughts can cause depression and anxiety. This can result in negative lifestyle behavior and serious health problems. Cognitive behavioral therapy is an effective method to help control our thoughts in order to change and improve our emotional outlook.  Additional Videos:  Exercise    Improving Performance  Clinical staff conducted group or individual video education with verbal and written material and guidebook.  Patient learns to use a non-linear approach  by alternating intensity levels and lengths of time spent exercising to help burn more calories and lose more body fat. Cardiovascular exercise helps improve heart health, metabolism, hormonal balance, blood sugar control, and recovery from fatigue. Resistance training improves strength, endurance, balance, coordination, reaction time, metabolism, and muscle mass. Flexibility exercise improves circulation, posture, and balance. Seek guidance from your physician and exercise physiologist before implementing an exercise routine and learn your capabilities and proper form for all exercise.  Introduction to Yoga  Clinical staff conducted group or individual video education with verbal and written material and guidebook.  Patient learns about yoga, a discipline of the coming together of mind, breath, and body. The benefits of yoga include improved flexibility, improved range of motion, better posture and core strength, increased lung function, weight loss, and positive self-image. Yoga's heart health benefits include lowered blood pressure, healthier heart rate, decreased cholesterol and triglyceride levels, improved immune function, and reduced stress.  Seek guidance from your physician and exercise physiologist before implementing an exercise routine and learn your capabilities and proper form for all exercise.  Medical   Aging: Enhancing Your Quality of Life  Clinical staff conducted group or individual video education with verbal and written material and guidebook.  Patient learns key strategies and recommendations to stay in good physical health and enhance quality of life, such as prevention strategies, having an advocate, securing a Health Care Proxy and Power of Attorney, and keeping a list of medications and system for tracking them. It also discusses how to avoid risk for bone loss.  Biology of Weight Control  Clinical staff conducted group or individual video education with verbal and written material and guidebook.  Patient learns that weight gain occurs because we consume more calories than we burn (eating more, moving less). Even if your body weight is normal, you may have higher ratios of fat compared to muscle mass. Too much body fat puts you at increased risk for cardiovascular disease, heart attack, stroke, type 2 diabetes, and obesity-related cancers. In addition to exercise, following the Pritikin Eating Plan can help reduce your risk.  Decoding Lab Results  Clinical staff conducted group or individual video education with verbal and written material and guidebook.  Patient learns that lab test reflects one measurement whose values change over time and are influenced by many factors, including medication, stress, sleep, exercise, food, hydration, pre-existing medical conditions, and more. It is recommended to use the knowledge from this video to become more involved with your lab results and evaluate your numbers to speak with your doctor.   Diseases of Our Time - Overview  Clinical staff conducted group or individual video education with verbal and written material and guidebook.  Patient learns that according to the CDC, 50%  to 70% of chronic diseases (such as obesity, type 2 diabetes, elevated lipids, hypertension, and heart disease) are avoidable through lifestyle improvements including healthier food choices, listening to satiety cues, and increased physical activity.  Sleep Disorders Clinical staff conducted group or individual video education with verbal and written material and guidebook.  Patient learns how good quality and duration of sleep are important to overall health and well-being. Patient also learns about sleep disorders and how they impact health along with recommendations to address them, including discussing with a physician.  Nutrition  Dining Out - Part 2 Clinical staff conducted group or individual video education with verbal and written material and guidebook.  Patient learns how to plan ahead and communicate in order to maximize their  dining experience in a healthy and nutritious manner. Included are recommended food choices based on the type of restaurant the patient is visiting.   Fueling a Banker conducted group or individual video education with verbal and written material and guidebook.  There is a strong connection between our food choices and our health. Diseases like obesity and type 2 diabetes are very prevalent and are in large-part due to lifestyle choices. The Pritikin Eating Plan provides plenty of food and hunger-curbing satisfaction. It is easy to follow, affordable, and helps reduce health risks.  Menu Workshop  Clinical staff conducted group or individual video education with verbal and written material and guidebook.  Patient learns that restaurant meals can sabotage health goals because they are often packed with calories, fat, sodium, and sugar. Recommendations include strategies to plan ahead and to communicate with the manager, chef, or server to help order a healthier meal.  Planning Your Eating Strategy  Clinical staff conducted group or individual  video education with verbal and written material and guidebook.  Patient learns about the Pritikin Eating Plan and its benefit of reducing the risk of disease. The Pritikin Eating Plan does not focus on calories. Instead, it emphasizes high-quality, nutrient-rich foods. By knowing the characteristics of the foods, we choose, we can determine their calorie density and make informed decisions.  Targeting Your Nutrition Priorities  Clinical staff conducted group or individual video education with verbal and written material and guidebook.  Patient learns that lifestyle habits have a tremendous impact on disease risk and progression. This video provides eating and physical activity recommendations based on your personal health goals, such as reducing LDL cholesterol, losing weight, preventing or controlling type 2 diabetes, and reducing high blood pressure.  Vitamins and Minerals  Clinical staff conducted group or individual video education with verbal and written material and guidebook.  Patient learns different ways to obtain key vitamins and minerals, including through a recommended healthy diet. It is important to discuss all supplements you take with your doctor.   Healthy Mind-Set    Smoking Cessation  Clinical staff conducted group or individual video education with verbal and written material and guidebook.  Patient learns that cigarette smoking and tobacco addiction pose a serious health risk which affects millions of people. Stopping smoking will significantly reduce the risk of heart disease, lung disease, and many forms of cancer. Recommended strategies for quitting are covered, including working with your doctor to develop a successful plan.  Culinary   Becoming a Set designer conducted group or individual video education with verbal and written material and guidebook.  Patient learns that cooking at home can be healthy, cost-effective, quick, and puts them in control.  Keys to cooking healthy recipes will include looking at your recipe, assessing your equipment needs, planning ahead, making it simple, choosing cost-effective seasonal ingredients, and limiting the use of added fats, salts, and sugars.  Cooking - Breakfast and Snacks  Clinical staff conducted group or individual video education with verbal and written material and guidebook.  Patient learns how important breakfast is to satiety and nutrition through the entire day. Recommendations include key foods to eat during breakfast to help stabilize blood sugar levels and to prevent overeating at meals later in the day. Planning ahead is also a key component.  Cooking - Educational psychologist conducted group or individual video education with verbal and written material and guidebook.  Patient learns eating strategies to improve overall  health, including an approach to cook more at home. Recommendations include thinking of animal protein as a side on your plate rather than center stage and focusing instead on lower calorie dense options like vegetables, fruits, whole grains, and plant-based proteins, such as beans. Making sauces in large quantities to freeze for later and leaving the skin on your vegetables are also recommended to maximize your experience.  Cooking - Healthy Salads and Dressing Clinical staff conducted group or individual video education with verbal and written material and guidebook.  Patient learns that vegetables, fruits, whole grains, and legumes are the foundations of the Pritikin Eating Plan. Recommendations include how to incorporate each of these in flavorful and healthy salads, and how to create homemade salad dressings. Proper handling of ingredients is also covered. Cooking - Soups and [employer] - Soups and Desserts Clinical staff conducted group or individual video education with verbal and written material and guidebook.  Patient learns that Pritikin soups and  desserts make for easy, nutritious, and delicious snacks and meal components that are low in sodium, fat, sugar, and calorie density, while high in vitamins, minerals, and filling fiber. Recommendations include simple and healthy ideas for soups and desserts.   Overview     The Pritikin Solution Program Overview Clinical staff conducted group or individual video education with verbal and written material and guidebook.  Patient learns that the results of the Pritikin Program have been documented in more than 100 articles published in peer-reviewed journals, and the benefits include reducing risk factors for (and, in some cases, even reversing) high cholesterol, high blood pressure, type 2 diabetes, obesity, and more! An overview of the three key pillars of the Pritikin Program will be covered: eating well, doing regular exercise, and having a healthy mind-set.  WORKSHOPS  Exercise: Exercise Basics: Building Your Action Plan Clinical staff led group instruction and group discussion with PowerPoint presentation and patient guidebook. To enhance the learning environment the use of posters, models and videos may be added. At the conclusion of this workshop, patients will comprehend the difference between physical activity and exercise, as well as the benefits of incorporating both, into their routine. Patients will understand the FITT (Frequency, Intensity, Time, and Type) principle and how to use it to build an exercise action plan. In addition, safety concerns and other considerations for exercise and cardiac rehab will be addressed by the presenter. The purpose of this lesson is to promote a comprehensive and effective weekly exercise routine in order to improve patients' overall level of fitness.   Managing Heart Disease: Your Path to a Healthier Heart Clinical staff led group instruction and group discussion with PowerPoint presentation and patient guidebook. To enhance the learning environment  the use of posters, models and videos may be added.At the conclusion of this workshop, patients will understand the anatomy and physiology of the heart. Additionally, they will understand how Pritikin's three pillars impact the risk factors, the progression, and the management of heart disease.  The purpose of this lesson is to provide a high-level overview of the heart, heart disease, and how the Pritikin lifestyle positively impacts risk factors.  Exercise Biomechanics Clinical staff led group instruction and group discussion with PowerPoint presentation and patient guidebook. To enhance the learning environment the use of posters, models and videos may be added. Patients will learn how the structural parts of their bodies function and how these functions impact their daily activities, movement, and exercise. Patients will learn how to promote a neutral spine,  learn how to manage pain, and identify ways to improve their physical movement in order to promote healthy living. The purpose of this lesson is to expose patients to common physical limitations that impact physical activity. Participants will learn practical ways to adapt and manage aches and pains, and to minimize their effect on regular exercise. Patients will learn how to maintain good posture while sitting, walking, and lifting.  Balance Training and Fall Prevention  Clinical staff led group instruction and group discussion with PowerPoint presentation and patient guidebook. To enhance the learning environment the use of posters, models and videos may be added. At the conclusion of this workshop, patients will understand the importance of their sensorimotor skills (vision, proprioception, and the vestibular system) in maintaining their ability to balance as they age. Patients will apply a variety of balancing exercises that are appropriate for their current level of function. Patients will understand the common causes for  poor balance, possible solutions to these problems, and ways to modify their physical environment in order to minimize their fall risk. The purpose of this lesson is to teach patients about the importance of maintaining balance as they age and ways to minimize their risk of falling.  WORKSHOPS   Nutrition:  Fueling a Ship broker led group instruction and group discussion with PowerPoint presentation and patient guidebook. To enhance the learning environment the use of posters, models and videos may be added. Patients will review the foundational principles of the Pritikin Eating Plan and understand what constitutes a serving size in each of the food groups. Patients will also learn Pritikin-friendly foods that are better choices when away from home and review make-ahead meal and snack options. Calorie density will be reviewed and applied to three nutrition priorities: weight maintenance, weight loss, and weight gain. The purpose of this lesson is to reinforce (in a group setting) the key concepts around what patients are recommended to eat and how to apply these guidelines when away from home by planning and selecting Pritikin-friendly options. Patients will understand how calorie density may be adjusted for different weight management goals.  Mindful Eating  Clinical staff led group instruction and group discussion with PowerPoint presentation and patient guidebook. To enhance the learning environment the use of posters, models and videos may be added. Patients will briefly review the concepts of the Pritikin Eating Plan and the importance of low-calorie dense foods. The concept of mindful eating will be introduced as well as the importance of paying attention to internal hunger signals. Triggers for non-hunger eating and techniques for dealing with triggers will be explored. The purpose of this lesson is to provide patients with the opportunity to review the basic principles of the  Pritikin Eating Plan, discuss the value of eating mindfully and how to measure internal cues of hunger and fullness using the Hunger Scale. Patients will also discuss reasons for non-hunger eating and learn strategies to use for controlling emotional eating.  Targeting Your Nutrition Priorities Clinical staff led group instruction and group discussion with PowerPoint presentation and patient guidebook. To enhance the learning environment the use of posters, models and videos may be added. Patients will learn how to determine their genetic susceptibility to disease by reviewing their family history. Patients will gain insight into the importance of diet as part of an overall healthy lifestyle in mitigating the impact of genetics and other environmental insults. The purpose of this lesson is to provide patients with the opportunity to assess their personal nutrition priorities by  looking at their family history, their own health history and current risk factors. Patients will also be able to discuss ways of prioritizing and modifying the Pritikin Eating Plan for their highest risk areas  Menu  Clinical staff led group instruction and group discussion with PowerPoint presentation and patient guidebook. To enhance the learning environment the use of posters, models and videos may be added. Using menus brought in from [employer], or printed from [employer], patients will apply the Pritikin dining out guidelines that were presented in the [employer] video. Patients will also be able to practice these guidelines in a variety of provided scenarios. The purpose of this lesson is to provide patients with the opportunity to practice hands-on learning of the Pritikin Dining Out guidelines with actual menus and practice scenarios.  Label Reading Clinical staff led group instruction and group discussion with PowerPoint presentation and patient guidebook. To enhance the learning environment  the use of posters, models and videos may be added. Patients will review and discuss the Pritikin label reading guidelines presented in Pritikin's Label Reading Educational series video. Using fool labels brought in from local grocery stores and markets, patients will apply the label reading guidelines and determine if the packaged food meet the Pritikin guidelines. The purpose of this lesson is to provide patients with the opportunity to review, discuss, and practice hands-on learning of the Pritikin Label Reading guidelines with actual packaged food labels. Cooking School  Pritikin's [employer] are designed to teach patients ways to prepare quick, simple, and affordable recipes at home. The importance of nutrition's role in chronic disease risk reduction is reflected in its emphasis in the overall Pritikin program. By learning how to prepare essential core Pritikin Eating Plan recipes, patients will increase control over what they eat; be able to customize the flavor of foods without the use of added salt, sugar, or fat; and improve the quality of the food they consume. By learning a set of core recipes which are easily assembled, quickly prepared, and affordable, patients are more likely to prepare more healthy foods at home. These workshops focus on convenient breakfasts, simple entres, side dishes, and desserts which can be prepared with minimal effort and are consistent with nutrition recommendations for cardiovascular risk reduction. Cooking [employer] are taught by a Armed forces logistics/support/administrative officer (RD) who has been trained by the [employer]. The chef or RD has a clear understanding of the importance of minimizing - if not completely eliminating - added fat, sugar, and sodium in recipes. Throughout the series of Cooking School Workshop sessions, patients will learn about healthy ingredients and efficient methods of cooking to build confidence in their capability to  prepare    Cooking School weekly topics:  Adding Flavor- Sodium-Free  Fast and Healthy Breakfasts  Powerhouse Plant-Based Proteins  Satisfying Salads and Dressings  Simple Sides and Sauces  International Cuisine-Spotlight on the [employer] Zones  Delicious Desserts  Savory Soups  [employer] - Meals in a Astronomer Appetizers and Snacks  Comforting Weekend Breakfasts  One-Pot Wonders   Fast Evening Meals  Landscape architect Your Pritikin Plate  WORKSHOPS   Healthy Mindset (Psychosocial):  Focused Goals, Sustainable Changes Clinical staff led group instruction and group discussion with PowerPoint presentation and patient guidebook. To enhance the learning environment the use of posters, models and videos may be added. Patients will be able to apply effective goal setting strategies to establish at least one personal goal,  and then take consistent, meaningful action toward that goal. They will learn to identify common barriers to achieving personal goals and develop strategies to overcome them. Patients will also gain an understanding of how our mind-set can impact our ability to achieve goals and the importance of cultivating a positive and growth-oriented mind-set. The purpose of this lesson is to provide patients with a deeper understanding of how to set and achieve personal goals, as well as the tools and strategies needed to overcome common obstacles which may arise along the way.  From Head to Heart: The Power of a Healthy Outlook  Clinical staff led group instruction and group discussion with PowerPoint presentation and patient guidebook. To enhance the learning environment the use of posters, models and videos may be added. Patients will be able to recognize and describe the impact of emotions and mood on physical health. They will discover the importance of self-care and explore self-care practices which may work for them. Patients will also learn how to utilize  the 4 C's to cultivate a healthier outlook and better manage stress and challenges. The purpose of this lesson is to demonstrate to patients how a healthy outlook is an essential part of maintaining good health, especially as they continue their cardiac rehab journey.  Healthy Sleep for a Healthy Heart Clinical staff led group instruction and group discussion with PowerPoint presentation and patient guidebook. To enhance the learning environment the use of posters, models and videos may be added. At the conclusion of this workshop, patients will be able to demonstrate knowledge of the importance of sleep to overall health, well-being, and quality of life. They will understand the symptoms of, and treatments for, common sleep disorders. Patients will also be able to identify daytime and nighttime behaviors which impact sleep, and they will be able to apply these tools to help manage sleep-related challenges. The purpose of this lesson is to provide patients with a general overview of sleep and outline the importance of quality sleep. Patients will learn about a few of the most common sleep disorders. Patients will also be introduced to the concept of "sleep hygiene," and discover ways to self-manage certain sleeping problems through simple daily behavior changes. Finally, the workshop will motivate patients by clarifying the links between quality sleep and their goals of heart-healthy living.   Recognizing and Reducing Stress Clinical staff led group instruction and group discussion with PowerPoint presentation and patient guidebook. To enhance the learning environment the use of posters, models and videos may be added. At the conclusion of this workshop, patients will be able to understand the types of stress reactions, differentiate between acute and chronic stress, and recognize the impact that chronic stress has on their health. They will also be able to apply different coping mechanisms, such as reframing  negative self-talk. Patients will have the opportunity to practice a variety of stress management techniques, such as deep abdominal breathing, progressive muscle relaxation, and/or guided imagery.  The purpose of this lesson is to educate patients on the role of stress in their lives and to provide healthy techniques for coping with it.  Learning Barriers/Preferences:  Learning Barriers/Preferences - 10/21/23 1400       Learning Barriers/Preferences   Learning Barriers Sight;Hearing;Exercise Concerns    Learning Preferences Group Instruction;Individual Instruction;Skilled Demonstration          Education Topics:  Knowledge Questionnaire Score:  Knowledge Questionnaire Score - 01/10/24 1634       Knowledge Questionnaire Score   Post Score  22/24          Core Components/Risk Factors/Patient Goals at Admission:  Personal Goals and Risk Factors at Admission - 10/21/23 1317       Core Components/Risk Factors/Patient Goals on Admission   Hypertension Yes    Intervention Provide education on lifestyle modifcations including regular physical activity/exercise, weight management, moderate sodium restriction and increased consumption of fresh fruit, vegetables, and low fat dairy, alcohol moderation, and smoking cessation.;Monitor prescription use compliance.    Expected Outcomes Short Term: Continued assessment and intervention until BP is < 140/31mm HG in hypertensive participants. < 130/28mm HG in hypertensive participants with diabetes, heart failure or chronic kidney disease.;Long Term: Maintenance of blood pressure at goal levels.    Lipids Yes    Intervention Provide education and support for participant on nutrition & aerobic/resistive exercise along with prescribed medications to achieve LDL 70mg , HDL >40mg .    Expected Outcomes Short Term: Participant states understanding of desired cholesterol values and is compliant with medications prescribed. Participant is following  exercise prescription and nutrition guidelines.;Long Term: Cholesterol controlled with medications as prescribed, with individualized exercise RX and with personalized nutrition plan. Value goals: LDL < 70mg , HDL > 40 mg.    Personal Goal Other Yes    Personal Goal Pt would like to increase stamina and get into shape and travel to [place] with his family    Intervention Will continue to monitor pt and progress workloads as tolerated without sign or symptom    Expected Outcomes Pt will achieve his goals and gain strength          Core Components/Risk Factors/Patient Goals Review:   Goals and Risk Factor Review     Row Name 10/28/23 1141 11/16/23 1722 12/15/23 0754 01/12/24 0753       Core Components/Risk Factors/Patient Goals Review   Personal Goals Review Weight Management/Obesity;Lipids;Hypertension;Diabetes Weight Management/Obesity;Lipids;Hypertension;Diabetes Weight Management/Obesity;Lipids;Hypertension;Diabetes Weight Management/Obesity;Lipids;Hypertension;Diabetes    Review Wynne started cardiac rehab on 10/27/23. Araceli did well with exercise. Vital signs and CBg's were stable. Jayron is somewhat deconditioned. Kyriakos is off to a good start  with exercise. Vital signs and CBg's remain stable. Zoey has lost 4.3 kg since starting cardiac rehab. Jlon continues to do well  with exercise. Vital signs and CBg's remain stable. Kaige has lost 3.6 kg since starting cardiac rehab. Yousof continues to do well  with exercise. Vital signs and CBg's remain stable. Breyden has lost 3.4 kg since starting cardiac rehab. Oisin will complete cardiac rehab on 01/12/24.    Expected Outcomes Darrik will continue to participate in cardiac rehab for exercise, nutrtion and lifestyle modifications. Prather will continue to participate in cardiac rehab for exercise, nutrtion and lifestyle modifications. Sun will continue to participate in cardiac rehab for exercise, nutrtion and lifestyle modifications. Larry will  continue to  exercise,  follow nutrtion and lifestyle modifications after completion of cardiac rehab.       Core Components/Risk Factors/Patient Goals at Discharge (Final Review):   Goals and Risk Factor Review - 01/12/24 0753       Core Components/Risk Factors/Patient Goals Review   Personal Goals Review Weight Management/Obesity;Lipids;Hypertension;Diabetes    Review Elward continues to do well  with exercise. Vital signs and CBg's remain stable. Jayln has lost 3.4 kg since starting cardiac rehab. Firmin will complete cardiac rehab on 01/12/24.    Expected Outcomes Felix will continue to  exercise,  follow nutrtion and lifestyle modifications after completion of cardiac rehab.  ITP Comments:  ITP Comments     Row Name 10/21/23 1310 10/27/23 0848 10/28/23 1107 11/16/23 1719 12/15/23 0753   ITP Comments Dr. Gaylyn Keas medical director. Introduction to Pritikin education program/intensive cardiac rehab. Initial orientation packet reviewed with patient 30 Day ITP Review. Virgilio is scheduled to exercise at cardiac rehab on 10/27/23 30 Day ITP Review. Roby started cardiac rehab on 10/28/23. Lemuel did well with exercise for his fitness level 30 Day ITP Review. Torence has good attendance and participation with exercise at cardiac rehab 30 Day ITP Review. Davonne continues to have good attendance and participation with exercise at cardiac rehab    Row Name 01/12/24 0751           ITP Comments 30 Day ITP Review. Delshawn continues to have good attendance and participation with exercise at cardiac rehab. Issaiah will complete cardiac rehab on 01/12/24          Comments: See ITP comments.Monte Antonio RN BSN

## 2024-01-13 ENCOUNTER — Encounter: Payer: Self-pay | Admitting: Hematology and Oncology

## 2024-01-13 ENCOUNTER — Other Ambulatory Visit: Payer: Self-pay | Admitting: Neurology

## 2024-01-14 DIAGNOSIS — N184 Chronic kidney disease, stage 4 (severe): Secondary | ICD-10-CM | POA: Diagnosis not present

## 2024-01-14 DIAGNOSIS — E113313 Type 2 diabetes mellitus with moderate nonproliferative diabetic retinopathy with macular edema, bilateral: Secondary | ICD-10-CM | POA: Diagnosis not present

## 2024-01-14 DIAGNOSIS — E0840 Diabetes mellitus due to underlying condition with diabetic neuropathy, unspecified: Secondary | ICD-10-CM | POA: Diagnosis not present

## 2024-01-14 DIAGNOSIS — I10 Essential (primary) hypertension: Secondary | ICD-10-CM | POA: Diagnosis not present

## 2024-01-18 ENCOUNTER — Other Ambulatory Visit (HOSPITAL_BASED_OUTPATIENT_CLINIC_OR_DEPARTMENT_OTHER): Payer: Self-pay

## 2024-01-18 MED ORDER — VITAMIN B-12 1000 MCG PO TABS: 1000.0000 ug | ORAL_TABLET | Freq: Every day | ORAL | 1 refills | Status: AC

## 2024-01-24 DIAGNOSIS — N184 Chronic kidney disease, stage 4 (severe): Secondary | ICD-10-CM | POA: Diagnosis not present

## 2024-01-24 DIAGNOSIS — I10 Essential (primary) hypertension: Secondary | ICD-10-CM | POA: Diagnosis not present

## 2024-01-24 DIAGNOSIS — E113313 Type 2 diabetes mellitus with moderate nonproliferative diabetic retinopathy with macular edema, bilateral: Secondary | ICD-10-CM | POA: Diagnosis not present

## 2024-01-24 DIAGNOSIS — E1140 Type 2 diabetes mellitus with diabetic neuropathy, unspecified: Secondary | ICD-10-CM | POA: Diagnosis not present

## 2024-01-24 DIAGNOSIS — E0840 Diabetes mellitus due to underlying condition with diabetic neuropathy, unspecified: Secondary | ICD-10-CM | POA: Diagnosis not present

## 2024-01-24 DIAGNOSIS — E1122 Type 2 diabetes mellitus with diabetic chronic kidney disease: Secondary | ICD-10-CM | POA: Diagnosis not present

## 2024-01-24 NOTE — Progress Notes (Signed)
 [facility] - Clinic Note  01/25/2024    CHIEF COMPLAINT Patient presents for Retina Follow Up  HISTORY OF PRESENT ILLNESS: Chad [name] is a 78 y.o. male who presents to the clinic today for:   HPI     Retina Follow Up   Patient presents with  Diabetic Retinopathy.  In both eyes.  This started 8 weeks ago.  Duration of weeks.  Since onset it is stable.  I, the attending physician,  performed the HPI with the patient and updated documentation appropriately.        Comments   8 week retina follow up NPDR and IVA OU pt is reporting no vision changes noticed he denies any flashes or floaters his last reading was 130       Last edited by Valdemar Rogue, MD on 01/25/2024  4:15 PM.     Pt states seems VA the same.   Referring physician: [name], MD [address] [place],  [place] [address]  HISTORICAL INFORMATION:   Selected notes from the MEDICAL RECORD NUMBER Referred by Dr. [name] for diabetic retinopathy with DME OU [name]:  Ocular Hx- PMH-    CURRENT MEDICATIONS: No current outpatient medications on file. (Ophthalmic Drugs)   No current facility-administered medications for this visit. (Ophthalmic Drugs)   Current Outpatient Medications (Other)  Medication Sig   amLODipine  (NORVASC ) 5 MG tablet Take 1 tablet (5 mg total) by mouth daily.   aspirin  EC 81 MG tablet Take 81 mg by mouth daily. Swallow whole.   B-D ULTRAFINE III SHORT PEN 31G X 8 MM MISC SMARTSIG:1 Each SUB-Q Daily   cyanocobalamin  (VITAMIN B12) 1000 MCG tablet Take 1 tablet (1,000 mcg total) by mouth daily.   gabapentin  (NEURONTIN ) 300 MG capsule TAKE 2 CAPSULES BY MOUTH TWICE A DAY   insulin  glargine (LANTUS ) 100 UNIT/ML injection Inject 14 Units into the skin at bedtime.   JARDIANCE  10 MG TABS tablet Take 10 mg by mouth daily.   levothyroxine  (SYNTHROID ) 100 MCG tablet Take 1 tablet (100 mcg total) by mouth daily before breakfast.   metoprolol  succinate (TOPROL  XL) 25  MG 24 hr tablet Take 0.5 tablets (12.5 mg total) by mouth daily.   ONETOUCH VERIO test strip    polyethylene glycol (MIRALAX  / GLYCOLAX ) 17 g packet Take 17 g by mouth as needed for moderate constipation.   rosuvastatin  (CRESTOR ) 20 MG tablet Take 20 mg by mouth daily.   No current facility-administered medications for this visit. (Other)   REVIEW OF SYSTEMS: ROS   Positive for: Endocrine, Cardiovascular, Eyes Negative for: Constitutional, Gastrointestinal, Neurological, Skin, Genitourinary, Musculoskeletal, HENT, Respiratory, Psychiatric, Allergic/Imm, Heme/Lymph Last edited by Resa Delon ORN, COT on 01/25/2024  1:50 PM.     ALLERGIES Allergies  Allergen Reactions   Pork-Derived Products Other (See Comments)    PAST MEDICAL HISTORY Past Medical History:  Diagnosis Date   Cataract    CKD (chronic kidney disease)    Colon polyps    Diabetes (HCC)    Diabetic polyneuropathy associated with type 2 diabetes mellitus (HCC) 01/15/2017   Diabetic retinopathy (HCC)    GERD (gastroesophageal reflux disease)    Hypercholesterolemia    Hypertension    Hypertensive retinopathy    Hypothyroidism    Polyneuropathy    Proteinuria    Severe aortic stenosis    Past Surgical History:  Procedure Laterality Date   CARDIAC SURGERY     IR THORACENTESIS ASP PLEURAL SPACE W/IMG  GUIDE  09/29/2023   IR THORACENTESIS ASP PLEURAL SPACE W/IMG GUIDE  12/16/2023   RIGHT/LEFT HEART CATH AND CORONARY ANGIOGRAPHY N/A 05/21/2023   Procedure: RIGHT/LEFT HEART CATH AND CORONARY ANGIOGRAPHY;  Surgeon: [name], MD;  Location: [facility];  Service: Cardiovascular;  Laterality: N/A;    FAMILY HISTORY Family History  Problem Relation Age of Onset   Stroke Father    Glaucoma Father    SOCIAL HISTORY Social History   Tobacco Use   Smoking status: Former   Smokeless tobacco: Never  Advertising account planner   Vaping status: Never Used  Substance Use Topics   Alcohol use: Yes   Drug use: No        OPHTHALMIC EXAM: Base Eye Exam     Visual Acuity (Snellen - Linear)       Right Left   Dist [place] 20/40 20/40 -1   Dist ph [place] NI NI         Tonometry (Tonopen, 1:55 PM)       Right Left   Pressure 18 18         Pupils       Pupils Dark Light Shape React APD   Right PERRL 3 2 Round Brisk None   Left PERRL 3 2 Round Brisk None         Visual Fields       Left Right    Full Full         Extraocular Movement       Right Left    Full, Ortho Full, Ortho         Neuro/Psych     Oriented x3: Yes   Mood/Affect: Normal         Dilation     Both eyes: 2.5% Phenylephrine @ 1:56 PM           Slit Lamp and Fundus Exam     External Exam       Right Left   External Normal Normal         Slit Lamp Exam       Right Left   Lids/Lashes Dermatochalasis - upper lid, mild Telangiectasia Dermatochalasis - upper lid, Meibomian gland dysfunction   Conjunctiva/Sclera White and quiet White and quiet   Cornea 2+ fine, inferior Punctate epithelial erosions 1+ Punctate epithelial erosions, mild tear film debris   Anterior Chamber deep, clear, narrow angles deep, clear, narrow angles   Iris Round and dilated, mild anterior bowing, No NVI Round and dilated, mild anterior bowing, No NVI   Lens 3+ Nuclear sclerosis w/ brunescence, 3+ Cortical cataract 3+ Nuclear sclerosis w/ brunescence, 3+ Cortical cataract   Anterior Vitreous Vitreous syneresis Vitreous syneresis         Fundus Exam       Right Left   Disc Pink and Sharp, no NVD Pink and Sharp, no NVD   C/D Ratio 0.3 0.3   Macula good foveal reflex, mild ERM, scattered MA and exudates, central cystic changes improved. Flat, good foveal reflex, minimal MA and focal exudates, trace residual cystic changes superior mac, scattered cystic changes improved.   Vessels attenuated, Tortuous attenuated, Tortuous, Copper  wiring, Macroaneurysm / CWS along IT arcades - improving   Periphery Attached, scattered MA  and CWS greatest posteriorly Attached, scattered MA/DBH greatest posteriorly, focal CWS inferior to IT arcades--fading           IMAGING AND PROCEDURES  Imaging and Procedures for 01/25/2024  OCT, Retina - OU -  Both Eyes       Right Eye Quality was good. Central Foveal Thickness: 316. Progression has improved. Findings include normal foveal contour, no SRF, intraretinal hyper-reflective material, epiretinal membrane, intraretinal fluid, macular pucker (Interval improvement in IRF / IRHM temporal macula and fovea ).   Left Eye Quality was good. Central Foveal Thickness: 263. Progression has improved. Findings include no SRF, abnormal foveal contour, intraretinal hyper-reflective material, intraretinal fluid (Interval improvement in IRF/IRHM --trace residual cystic changes sup mac. ).   Notes *Images captured and stored on drive  Diagnosis / Impression:  +DME OU OD: Interval improvement in IRF / IRHM temporal macula and fovea  OS: Interval improvement in IRF/IRHM --trace residual cystic changes sup mac.   Clinical management:  See below  Abbreviations: NFP - Normal foveal profile. CME - cystoid macular edema. PED - pigment epithelial detachment. IRF - intraretinal fluid. SRF - subretinal fluid. EZ - ellipsoid zone. ERM - epiretinal membrane. ORA - outer retinal atrophy. ORT - outer retinal tubulation. SRHM - subretinal hyper-reflective material. IRHM - intraretinal hyper-reflective material      Intravitreal Injection, Pharmacologic Agent - OD - Right Eye       Time Out 01/25/2024. 2:58 PM. Confirmed correct patient, procedure, site, and patient consented.   Anesthesia Topical anesthesia was used. Anesthetic medications included Lidocaine  2%, Proparacaine 0.5%.   Procedure Preparation included 5% betadine to ocular surface, eyelid speculum. A supplied (32g) needle was used.   Injection: 1.25 mg Bevacizumab  1.25mg /0.20ml   Route: Intravitreal, Site: Right Eye   NDC:  [id number], Lot: [id number], Expiration date: 02/21/2024   Post-op Post injection exam found visual acuity of at least counting fingers. The patient tolerated the procedure well. There were no complications. The patient received written and verbal post procedure care education. Post injection medications were not given.      Intravitreal Injection, Pharmacologic Agent - OS - Left Eye       Time Out 01/25/2024. 2:59 PM. Confirmed correct patient, procedure, site, and patient consented.   Anesthesia Topical anesthesia was used. Anesthetic medications included Lidocaine  2%, Proparacaine 0.5%.   Procedure Preparation included 5% betadine to ocular surface, eyelid speculum. A supplied (32g) needle was used.   Injection: 1.25 mg Bevacizumab  1.25mg /0.39ml   Route: Intravitreal, Site: Left Eye   NDC: H525437, Lot: [id number], Expiration date: 03/12/2024   Post-op Post injection exam found visual acuity of at least counting fingers. The patient tolerated the procedure well. There were no complications. The patient received written and verbal post procedure care education. Post injection medications were not given.            ASSESSMENT/PLAN:    ICD-10-CM   1. Moderate nonproliferative diabetic retinopathy of both eyes with macular edema associated with type 2 diabetes mellitus (HCC)  E11.3313 OCT, Retina - OU - Both Eyes    Intravitreal Injection, Pharmacologic Agent - OD - Right Eye    Intravitreal Injection, Pharmacologic Agent - OS - Left Eye    Bevacizumab  (AVASTIN ) SOLN 1.25 mg    Bevacizumab  (AVASTIN ) SOLN 1.25 mg    2. 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 Essential hypertension  I10     4. Hypertensive retinopathy of both eyes  H35.033     5. Combined forms of age-related cataract of both eyes  H25.813      1. Moderate Non-proliferative diabetic retinopathy w/ DME OU  - s/p IVA OD #1 (11.17.22) - s/p IVA OS #1 (11.14.22), #7 (05.12.23), #  8 (06.09.23), #12  (02.18.25) - s/p IVA OU #2 (12.15.22), #3 (01.12.23), #4 (02.13.23), #5 (03.16.23), #6 (04.12.23) ================================ - s/p IVE OD #1 (05.12.23) -- sample, #2 (06.09.23), #3 (07.07.23), #4 (08.04.23), #5 (09.22.23), #6 (11.07.23), #7 (11.07.23), #8 (01.30.24), #9 (03.12.24), #10 (05.01.24), #11 (06.19.24), #12 (08.23.24), #13 (11.12.24) - s/p IVE OS #1 (07.07.23), #2 (08.04.23), #3 (09.22.23), #4 (11.07.23),#5 (11.07.23), #6 (01.30.24), #7 (03.12.24), #8 (05.01.24), #9 (06.19.24), #10 (08.23.24), #11 (11.12.24) ===================================== - exam shows scattered MA/DBH OU, +edema and exudates - FA (11.14.22) shows vascular perfusion defects OU, no NV OU, OS with focal macroaneurysm along IT arcades - OCT shows OD: IInterval improvement in IRF / IRHM temporal macula and fovea ; OS: Interval improvement in IRF/IRHM --trace residual cystic changes sup mac @ 6wks.  - BCVA OD 20/40 from  20/30, OS 20/40 from 20/30 -- suspect cataract progression - recommend IVA OU (OD #7 and OS #10) today, 05.20.25 w/ f/u in 6 weeks - pt wishes to proceed with injection OU - RBA of procedure discussed, questions answered - IVA informed consent obtained and re-signed OU, 02.18.25 - see procedure note  - no funding for Good Days - f/u in 6 weeks, DFE, OCT, possible injection(s)  2. Retinal macroaneurysm OS  - focal lesion along IT arcades -- regressed  - s/p IVA OS as above  3,4. Hypertensive retinopathy OU - discussed importance of tight BP control - continue to monitor  5.  Mixed Cataract OU  - being monitored by Dr. [name] - The symptoms of cataract, surgical options, and treatments and risks were discussed with patient.  - discussed diagnosis and progression - approaching visual significance, refer to Dr. [name] for cataract surgical eval.   Ophthalmic Meds Ordered this visit:  Meds ordered this encounter  Medications   Bevacizumab  (AVASTIN ) SOLN 1.25 mg   Bevacizumab  (AVASTIN )  SOLN 1.25 mg     Return in about 6 weeks (around 03/07/2024) for NPDR OU, DFE, OCT.  There are no Patient Instructions on file for this visit.  Explained the diagnoses, plan, and follow up with the patient and they expressed understanding.  Patient expressed understanding of the importance of proper follow up care.   This document serves as a record of services personally performed by Redell JUDITHANN Hans, MD, PhD. It was created on their behalf by Auston Muzzy, COMT. The creation of this record is the provider's dictation and/or activities during the visit.  Electronically signed by: Auston Muzzy, COMT 01/25/24 4:17 PM  This document serves as a record of services personally performed by Redell JUDITHANN Hans, MD, PhD. It was created on their behalf by Almetta Pesa, an ophthalmic technician. The creation of this record is the provider's dictation and/or activities during the visit.    Electronically signed by: Almetta Pesa, OA, 01/25/24  4:17 PM   Redell JUDITHANN Hans, M.D., Ph.D. Diseases & Surgery of the Retina and Vitreous [facility]  I have reviewed the above documentation for accuracy and completeness, and I agree with the above. Redell JUDITHANN Hans, M.D., Ph.D. 01/25/24 4:19 PM   Abbreviations: M myopia (nearsighted); A astigmatism; H hyperopia (farsighted); P presbyopia; Mrx spectacle prescription;  CTL contact lenses; OD right eye; OS left eye; OU both eyes  XT exotropia; ET esotropia; PEK punctate epithelial keratitis; PEE punctate epithelial erosions; DES dry eye syndrome; MGD meibomian gland dysfunction; ATs artificial tears; PFAT's preservative free artificial tears; NSC nuclear sclerotic cataract; PSC posterior subcapsular cataract; ERM epi-retinal membrane; PVD posterior  vitreous detachment; RD retinal detachment; DM diabetes mellitus; DR diabetic retinopathy; NPDR non-proliferative diabetic retinopathy; PDR proliferative diabetic retinopathy; CSME clinically  significant macular edema; DME diabetic macular edema; dbh dot blot hemorrhages; CWS cotton wool spot; POAG primary open angle glaucoma; C/D cup-to-disc ratio; HVF humphrey visual field; GVF goldmann visual field; OCT optical coherence tomography; IOP intraocular pressure; BRVO Branch retinal vein occlusion; CRVO central retinal vein occlusion; CRAO central retinal artery occlusion; BRAO branch retinal artery occlusion; RT retinal tear; SB scleral buckle; PPV pars plana vitrectomy; VH Vitreous hemorrhage; PRP panretinal laser photocoagulation; IVK intravitreal kenalog; VMT vitreomacular traction; MH Macular hole;  NVD neovascularization of the disc; NVE neovascularization elsewhere; AREDS age related eye disease study; ARMD age related macular degeneration; POAG primary open angle glaucoma; EBMD epithelial/anterior basement membrane dystrophy; ACIOL anterior chamber intraocular lens; IOL intraocular lens; PCIOL posterior chamber intraocular lens; Phaco/IOL phacoemulsification with intraocular lens placement; PRK photorefractive keratectomy; LASIK laser assisted in situ keratomileusis; HTN hypertension; DM diabetes mellitus; COPD chronic obstructive pulmonary disease

## 2024-01-25 ENCOUNTER — Encounter (INDEPENDENT_AMBULATORY_CARE_PROVIDER_SITE_OTHER): Payer: Self-pay | Admitting: Ophthalmology

## 2024-01-25 ENCOUNTER — Ambulatory Visit (INDEPENDENT_AMBULATORY_CARE_PROVIDER_SITE_OTHER): Admitting: Ophthalmology

## 2024-01-25 DIAGNOSIS — H35012 Changes in retinal vascular appearance, left eye: Secondary | ICD-10-CM

## 2024-01-25 DIAGNOSIS — E113313 Type 2 diabetes mellitus with moderate nonproliferative diabetic retinopathy with macular edema, bilateral: Secondary | ICD-10-CM

## 2024-01-25 DIAGNOSIS — I10 Essential (primary) hypertension: Secondary | ICD-10-CM

## 2024-01-25 DIAGNOSIS — H35033 Hypertensive retinopathy, bilateral: Secondary | ICD-10-CM | POA: Diagnosis not present

## 2024-01-25 DIAGNOSIS — H25813 Combined forms of age-related cataract, bilateral: Secondary | ICD-10-CM

## 2024-01-25 MED ORDER — BEVACIZUMAB CHEMO INJECTION 1.25MG/0.05ML SYRINGE FOR KALEIDOSCOPE
1.2500 mg | INTRAVITREAL | Status: AC | PRN
  Administered 2024-01-25: 1.25 mg via INTRAVITREAL

## 2024-01-26 ENCOUNTER — Ambulatory Visit (HOSPITAL_COMMUNITY)
Admission: RE | Admit: 2024-01-26 | Discharge: 2024-01-26 | Disposition: A | Discharge status: Home / Self Care | Source: Ambulatory Visit | Attending: Hematology and Oncology | Admitting: Hematology and Oncology

## 2024-01-26 DIAGNOSIS — D696 Thrombocytopenia, unspecified: Secondary | ICD-10-CM | POA: Insufficient documentation

## 2024-01-26 DIAGNOSIS — K838 Other specified diseases of biliary tract: Secondary | ICD-10-CM | POA: Diagnosis not present

## 2024-01-27 DIAGNOSIS — I10 Essential (primary) hypertension: Secondary | ICD-10-CM | POA: Diagnosis not present

## 2024-01-27 DIAGNOSIS — E1165 Type 2 diabetes mellitus with hyperglycemia: Secondary | ICD-10-CM | POA: Diagnosis not present

## 2024-01-27 DIAGNOSIS — E039 Hypothyroidism, unspecified: Secondary | ICD-10-CM | POA: Diagnosis not present

## 2024-02-12 DIAGNOSIS — I10 Essential (primary) hypertension: Secondary | ICD-10-CM | POA: Diagnosis not present

## 2024-02-12 DIAGNOSIS — E0840 Diabetes mellitus due to underlying condition with diabetic neuropathy, unspecified: Secondary | ICD-10-CM | POA: Diagnosis not present

## 2024-02-12 DIAGNOSIS — E113313 Type 2 diabetes mellitus with moderate nonproliferative diabetic retinopathy with macular edema, bilateral: Secondary | ICD-10-CM | POA: Diagnosis not present

## 2024-02-12 DIAGNOSIS — N184 Chronic kidney disease, stage 4 (severe): Secondary | ICD-10-CM | POA: Diagnosis not present

## 2024-02-15 DIAGNOSIS — N184 Chronic kidney disease, stage 4 (severe): Secondary | ICD-10-CM | POA: Diagnosis not present

## 2024-02-17 ENCOUNTER — Encounter: Payer: Self-pay | Admitting: Hematology and Oncology

## 2024-02-17 ENCOUNTER — Other Ambulatory Visit: Payer: Self-pay | Admitting: Thoracic Surgery (Cardiothoracic Vascular Surgery)

## 2024-02-17 DIAGNOSIS — J90 Pleural effusion, not elsewhere classified: Secondary | ICD-10-CM

## 2024-02-18 ENCOUNTER — Telehealth: Payer: Self-pay | Admitting: *Deleted

## 2024-02-18 ENCOUNTER — Encounter: Payer: Self-pay | Admitting: Neurology

## 2024-02-18 ENCOUNTER — Other Ambulatory Visit: Payer: Self-pay | Admitting: Hematology and Oncology

## 2024-02-18 DIAGNOSIS — K839 Disease of biliary tract, unspecified: Secondary | ICD-10-CM

## 2024-02-18 NOTE — Telephone Encounter (Signed)
 TCT patient regarding his recent US  of his abdomen. Advised that Dr. Federico has reviewed the results. Per Dr. Federico:  Please let the patient know that the Liver MRI did show some concern for dilation of the bile duct and possible lesion in the gallbladder. An MRI is recommended (order has been placed). Additionally please recommend he continue his Vitamin b12 PO supplementation. We will plan to see him back after approximately 3 months on the B12 unless there are concerning findings on the MRI liver, in which case we'll bring him back sooner. If the b12 is not improving his counts we'll consider the bone marrow biopsy.   Pt advised of the above. He voiced understanding. Scheduling message sent for 3 month lab/clinic visit though he understands we may need to see him sooner depending on his liver MRI

## 2024-02-22 ENCOUNTER — Ambulatory Visit: Admitting: Thoracic Surgery (Cardiothoracic Vascular Surgery)

## 2024-02-22 ENCOUNTER — Other Ambulatory Visit: Payer: Self-pay

## 2024-02-22 ENCOUNTER — Ambulatory Visit (HOSPITAL_COMMUNITY)
Admission: RE | Admit: 2024-02-22 | Discharge: 2024-02-22 | Disposition: A | Discharge status: Home / Self Care | Source: Ambulatory Visit | Attending: Cardiology

## 2024-02-22 ENCOUNTER — Ambulatory Visit
Attending: Thoracic Surgery (Cardiothoracic Vascular Surgery) | Admitting: Thoracic Surgery (Cardiothoracic Vascular Surgery)

## 2024-02-22 VITALS — BP 139/70 | HR 55 | Resp 20 | Ht 68.0 in | Wt 181.3 lb

## 2024-02-22 DIAGNOSIS — I350 Nonrheumatic aortic (valve) stenosis: Secondary | ICD-10-CM | POA: Diagnosis not present

## 2024-02-22 DIAGNOSIS — I129 Hypertensive chronic kidney disease with stage 1 through stage 4 chronic kidney disease, or unspecified chronic kidney disease: Secondary | ICD-10-CM | POA: Diagnosis not present

## 2024-02-22 DIAGNOSIS — N2889 Other specified disorders of kidney and ureter: Secondary | ICD-10-CM | POA: Diagnosis not present

## 2024-02-22 DIAGNOSIS — R918 Other nonspecific abnormal finding of lung field: Secondary | ICD-10-CM | POA: Diagnosis not present

## 2024-02-22 DIAGNOSIS — E1122 Type 2 diabetes mellitus with diabetic chronic kidney disease: Secondary | ICD-10-CM | POA: Diagnosis not present

## 2024-02-22 DIAGNOSIS — J90 Pleural effusion, not elsewhere classified: Secondary | ICD-10-CM

## 2024-02-22 DIAGNOSIS — E039 Hypothyroidism, unspecified: Secondary | ICD-10-CM | POA: Diagnosis not present

## 2024-02-22 DIAGNOSIS — E785 Hyperlipidemia, unspecified: Secondary | ICD-10-CM | POA: Diagnosis not present

## 2024-02-22 DIAGNOSIS — N184 Chronic kidney disease, stage 4 (severe): Secondary | ICD-10-CM | POA: Diagnosis not present

## 2024-02-22 MED ORDER — NITROGLYCERIN 0.4 MG SL SUBL: 0.4000 mg | SUBLINGUAL_TABLET | SUBLINGUAL | 3 refills | Status: AC | PRN

## 2024-02-22 NOTE — Progress Notes (Signed)
 [address] [name] [address]             [phone]    HPI: [name] returns for follow-up of his postoperative left pleural effusion.  Chad [name] is a 78 year old gentleman with a history of aortic stenosis, CAD, diabetes, hypertension, hyperlipidemia, polyneuropathy, hypothyroidism, and reflux.  He had AVR CABG by Dr. V[place]a at [facility] in January 2025.  Postoperatively he had a renal dysfunction and atrial fibrillation.  In March he presented to the emergency room shortness of breath and had a large left pleural effusion.  Thoracentesis drained 1.9 L of fluid.  Had a second thoracentesis in May which drained about a liter of fluid.  Treated with a prednisone  taper.  I saw him in June about 6 weeks ago.  He had a residual effusion.  Gave him another prednisone  taper.  For the past 6 weeks been feeling well.  He says he is occasionally having some tightness in the left side of his chest.  He is not sure if that is related to the incision or not.  No problems with shortness of breath.  Past Medical History:  Diagnosis Date   Cataract    CKD (chronic kidney disease)    Colon polyps    Diabetes (HCC)    Diabetic polyneuropathy associated with type 2 diabetes mellitus (HCC) 01/15/2017   Diabetic retinopathy (HCC)    GERD (gastroesophageal reflux disease)    Hypercholesterolemia    Hypertension    Hypertensive retinopathy    Hypothyroidism    Polyneuropathy    Proteinuria    Severe aortic stenosis     Current Outpatient Medications  Medication Sig Dispense Refill   amLODipine  (NORVASC ) 5 MG tablet Take 1 tablet (5 mg total) by mouth daily. 30 tablet 1   aspirin  EC 81 MG tablet Take 81 mg by mouth daily. Swallow whole.     B-D ULTRAFINE III SHORT PEN 31G X 8 MM MISC SMARTSIG:1 Each SUB-Q Daily     cyanocobalamin  (VITAMIN B12) 1000 MCG tablet Take 1 tablet (1,000 mcg total) by mouth daily. 90 tablet 1   gabapentin  (NEURONTIN ) 300 MG capsule TAKE 2 CAPSULES  BY MOUTH TWICE A DAY 360 capsule 0   insulin  glargine (LANTUS ) 100 UNIT/ML injection Inject 14 Units into the skin at bedtime.     JARDIANCE  10 MG TABS tablet Take 10 mg by mouth daily.     levothyroxine  (SYNTHROID ) 100 MCG tablet Take 1 tablet (100 mcg total) by mouth daily before breakfast. 30 tablet 0   metoprolol  succinate (TOPROL  XL) 25 MG 24 hr tablet Take 0.5 tablets (12.5 mg total) by mouth daily. 45 tablet 3   ONETOUCH VERIO test strip      polyethylene glycol (MIRALAX  / GLYCOLAX ) 17 g packet Take 17 g by mouth as needed for moderate constipation.     rosuvastatin  (CRESTOR ) 20 MG tablet Take 20 mg by mouth daily.     spironolactone (ALDACTONE) 25 MG tablet Take 12.5 mg by mouth daily.     No current facility-administered medications for this visit.    Physical Exam BP 139/70 (BP Location: Left Arm, Patient Position: Sitting, Cuff Size: Normal)   Pulse (!) 55   Resp 20   Ht 5' 8 (1.727 m)   Wt 181 lb 4.8 oz (82.2 kg)   SpO2 96% Comment: RA  BMI 27.18 kg/m  78 year old man in no acute distress Alert and oriented x 3 with  no focal deficits Lungs slight diminished at left base  Diagnostic Tests: I personally reviewed his chest x-ray images.  Small residual left pleural effusion, improved from prior chest x-ray.  Impression: Chad [name] is a 78 year old gentleman who had an aortic valve replacement and coronary bypass grafting x 1 at [facility] back in January.  Complicated by acute on chronic renal insufficiency, atrial fibrillation, and a left pleural effusion.  Significant improvement in his pleural effusion after 2 thoracenteses and couple of prednisone  tapers.  Does have a small residual effusion but not enough to warrant intervention.  Has complained of some episodes of chest tightness.  He is really nonspecific about when they occur.  Does not seem to be exertion related.  Might be related to his incision but obviously there is always concern for graft issues.  Recommended that  he follow-up with Dr. Nishan if those were to become more common or more troublesome.  Plan: Follow-up with Dr. Nishan   Mckaylee Dimalanta C Yizel Canby, MD [facility] [phone]

## 2024-02-22 NOTE — Progress Notes (Unsigned)
 Nishan, Peter C, MD  Kerrin Elspeth BROCKS, MD; Drena Noel Males, RN Pam can you make sure he has good SL nitroglycerin  and have him call us  if using frequently. He has had orthostatic hypotension although BP ok at CVTS office Can consider imdur if taking nitroglycerin  a lot. His LIMA/SVG OM are not that old and with all his renal failure issues It would take a lot of clinical angina for me to recommend cath Marcey. For this reason I would not do myovue as I would only cath for crescendo clinical angina   Will send in nitroglycerin  for patient to take.    Left message for patient to call back to see if nitroglycerin  if helping, and see how frequently he is using it.

## 2024-02-22 NOTE — Progress Notes (Signed)
 [facility] - Clinic Note  03/07/2024    CHIEF COMPLAINT Patient presents for Retina Follow Up  HISTORY OF PRESENT ILLNESS: Chad [name] is a 78 y.o. male who presents to the clinic today for:   HPI     Retina Follow Up   Patient presents with  Diabetic Retinopathy.  In both eyes.  Severity is moderate.  Duration of 6 weeks.  Since onset it is stable.  I, the attending physician,  performed the HPI with the patient and updated documentation appropriately.        Comments   6 week Retina eval. Patient states no changes in vision      Last edited by Valdemar Rogue, MD on 03/07/2024  5:30 PM.    Pt states seems VA the same. Had a spike in BP. Going Friday for a cataract eval w/ Dr. [name].  Referring physician: [name], MD [address] [place],  [place] [address]  HISTORICAL INFORMATION:   Selected notes from the MEDICAL RECORD NUMBER Referred by Dr. [name] for diabetic retinopathy with DME OU [name]:  Ocular Hx- PMH-    CURRENT MEDICATIONS: No current outpatient medications on file. (Ophthalmic Drugs)   No current facility-administered medications for this visit. (Ophthalmic Drugs)   Current Outpatient Medications (Other)  Medication Sig   amLODipine  (NORVASC ) 5 MG tablet Take 1 tablet (5 mg total) by mouth daily.   aspirin  EC 81 MG tablet Take 81 mg by mouth daily. Swallow whole.   B-D ULTRAFINE III SHORT PEN 31G X 8 MM MISC SMARTSIG:1 Each SUB-Q Daily   cyanocobalamin  (VITAMIN B12) 1000 MCG tablet Take 1 tablet (1,000 mcg total) by mouth daily.   gabapentin  (NEURONTIN ) 300 MG capsule TAKE 2 CAPSULES BY MOUTH TWICE A DAY   insulin  glargine (LANTUS ) 100 UNIT/ML injection Inject 14 Units into the skin at bedtime.   JARDIANCE  10 MG TABS tablet Take 10 mg by mouth daily.   levothyroxine  (SYNTHROID ) 100 MCG tablet Take 1 tablet (100 mcg total) by mouth daily before breakfast.   metoprolol  succinate (TOPROL  XL) 25 MG 24 hr tablet Take  0.5 tablets (12.5 mg total) by mouth daily.   nitroGLYCERIN  (NITROSTAT ) 0.4 MG SL tablet Place 1 tablet (0.4 mg total) under the tongue every 5 (five) minutes as needed for chest pain.   ONETOUCH VERIO test strip    polyethylene glycol (MIRALAX  / GLYCOLAX ) 17 g packet Take 17 g by mouth as needed for moderate constipation.   rosuvastatin  (CRESTOR ) 20 MG tablet Take 20 mg by mouth daily.   spironolactone (ALDACTONE) 25 MG tablet Take 12.5 mg by mouth daily.   No current facility-administered medications for this visit. (Other)   REVIEW OF SYSTEMS: ROS   Positive for: Endocrine, Cardiovascular, Eyes Negative for: Constitutional, Gastrointestinal, Neurological, Skin, Genitourinary, Musculoskeletal, HENT, Respiratory, Psychiatric, Allergic/Imm, Heme/Lymph Last edited by German Olam BRAVO, COT on 03/07/2024  1:59 PM.      ALLERGIES Allergies  Allergen Reactions   Pork-Derived Products Other (See Comments)    PAST MEDICAL HISTORY Past Medical History:  Diagnosis Date   Cataract    CKD (chronic kidney disease)    Colon polyps    Diabetes (HCC)    Diabetic polyneuropathy associated with type 2 diabetes mellitus (HCC) 01/15/2017   Diabetic retinopathy (HCC)    GERD (gastroesophageal reflux disease)    Hypercholesterolemia    Hypertension    Hypertensive retinopathy    Hypothyroidism    Polyneuropathy  Proteinuria    Severe aortic stenosis    Past Surgical History:  Procedure Laterality Date   CARDIAC SURGERY     IR THORACENTESIS ASP PLEURAL SPACE W/IMG GUIDE  09/29/2023   IR THORACENTESIS ASP PLEURAL SPACE W/IMG GUIDE  12/16/2023   RIGHT/LEFT HEART CATH AND CORONARY ANGIOGRAPHY N/A 05/21/2023   Procedure: RIGHT/LEFT HEART CATH AND CORONARY ANGIOGRAPHY;  Surgeon: [name], MD;  Location: [facility];  Service: Cardiovascular;  Laterality: N/A;    FAMILY HISTORY Family History  Problem Relation Age of Onset   Stroke Father    Glaucoma Father    SOCIAL  HISTORY Social History   Tobacco Use   Smoking status: Former   Smokeless tobacco: Never  Advertising account planner   Vaping status: Never Used  Substance Use Topics   Alcohol use: Yes   Drug use: No       OPHTHALMIC EXAM: Base Eye Exam     Visual Acuity (Snellen - Linear)       Right Left   Dist [place] 20/40 -2 20/40 +1   Dist ph [place] 20/NI 20/NI         Tonometry (Tonopen, 2:00 PM)       Right Left   Pressure 15 18         Pupils       Dark Light Shape React APD   Right 3 2 Round Brisk None   Left 3 2 Round Brisk None         Visual Fields (Counting fingers)       Left Right    Full Full         Extraocular Movement       Right Left    Full Full         Neuro/Psych     Oriented x3: Yes   Mood/Affect: Normal         Dilation     Both eyes: 2.5% Phenylephrine @ 2:00 PM           Slit Lamp and Fundus Exam     External Exam       Right Left   External Normal Normal         Slit Lamp Exam       Right Left   Lids/Lashes Dermatochalasis - upper lid, mild Telangiectasia Dermatochalasis - upper lid, Meibomian gland dysfunction   Conjunctiva/Sclera White and quiet White and quiet   Cornea 2+ fine, inferior Punctate epithelial erosions 1+ Punctate epithelial erosions, mild tear film debris   Anterior Chamber deep, clear, narrow angles deep, clear, narrow angles   Iris Round and dilated, mild anterior bowing, No NVI Round and dilated, mild anterior bowing, No NVI   Lens 3+ Nuclear sclerosis w/ brunescence, 3+ Cortical cataract 3+ Nuclear sclerosis w/ brunescence, 3+ Cortical cataract   Anterior Vitreous Vitreous syneresis Vitreous syneresis         Fundus Exam       Right Left   Disc Pink and Sharp, no NVD Pink and Sharp, no NVD   C/D Ratio 0.3 0.3   Macula good foveal reflex, mild ERM, scattered MA and exudates, central cystic changes slightly increased. Flat, good foveal reflex, minimal MA and focal exudates, trace residual cystic changes  superior mac, scattered cystic changes improved.   Vessels attenuated, Tortuous attenuated, Tortuous, Copper  wiring, Macroaneurysm / CWS along IT arcades - improving   Periphery Attached, scattered MA and CWS greatest posteriorly Attached, scattered MA/DBH greatest posteriorly,  focal CWS inferior to IT arcades--fading           IMAGING AND PROCEDURES  Imaging and Procedures for 03/07/2024  OCT, Retina - OU - Both Eyes       Right Eye Quality was good. Central Foveal Thickness: 326. Progression has worsened. Findings include normal foveal contour, no SRF, intraretinal hyper-reflective material, epiretinal membrane, intraretinal fluid, macular pucker (Mild interval increase in IRF / IRHM temporal macula and fovea ).   Left Eye Quality was good. Central Foveal Thickness: 262. Progression has been stable. Findings include normal foveal contour, no SRF, intraretinal hyper-reflective material, intraretinal fluid (Stable improvement in IRF/IRHM centrally--residual cystic changes sup mac. ).   Notes *Images captured and stored on drive  Diagnosis / Impression:  +DME OU OD: Mild interval increase in IRF / IRHM temporal macula and fovea  OS: Stable improvement in IRF/IRHM centrally--residual cystic changes sup mac.    Clinical management:  See below  Abbreviations: NFP - Normal foveal profile. CME - cystoid macular edema. PED - pigment epithelial detachment. IRF - intraretinal fluid. SRF - subretinal fluid. EZ - ellipsoid zone. ERM - epiretinal membrane. ORA - outer retinal atrophy. ORT - outer retinal tubulation. SRHM - subretinal hyper-reflective material. IRHM - intraretinal hyper-reflective material      Intravitreal Injection, Pharmacologic Agent - OD - Right Eye       Time Out 03/07/2024. 3:11 PM. Confirmed correct patient, procedure, site, and patient consented.   Anesthesia Topical anesthesia was used. Anesthetic medications included Lidocaine  2%, Proparacaine 0.5%.    Procedure Preparation included 5% betadine to ocular surface, eyelid speculum. A supplied (32g) needle was used.   Injection: 1.25 mg Bevacizumab  1.25mg /0.25ml   Route: Intravitreal, Site: Right Eye   NDC: [id number], Lot: [id number]$$MzfnczAzqnmzIZPI_ctVsBvLNZpSlVpFfPUbPDlmzTBxmjSlR$ , Expiration date: 04/08/2024   Post-op Post injection exam found visual acuity of at least counting fingers. The patient tolerated the procedure well. There were no complications. The patient received written and verbal post procedure care education. Post injection medications were not given.            ASSESSMENT/PLAN:    ICD-10-CM   1. Moderate nonproliferative diabetic retinopathy of both eyes with macular edema associated with type 2 diabetes mellitus (HCC)  E11.3313 OCT, Retina - OU - Both Eyes    Intravitreal Injection, Pharmacologic Agent - OD - Right Eye    Bevacizumab  (AVASTIN ) SOLN 1.25 mg    2. 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 Essential hypertension  I10     4. Hypertensive retinopathy of both eyes  H35.033     5. Combined forms of age-related cataract of both eyes  H25.813       1. Moderate Non-proliferative diabetic retinopathy w/ DME OU  - s/p IVA OD #1 (11.17.22), #2 (12.15.22), #3 (01.12.23), #4 (02.13.23), #5 (03.16.23), #6 (04.12.23), #7 (05.20.25) - s/p IVA OS #1 (11.14.22), #2 (12.15.22), #3 (01.12.23), #4 (02.13.23), #5 (03.16.23), #6 (04.12.23), #7 (05.12.23), #8 (06.09.23), #9 (02.18.25), #10 (05.20.25)  ================================ - s/p IVE OD #1 (05.12.23) -- sample, #2 (06.09.23), #3 (07.07.23), #4 (08.04.23), #5 (09.22.23), #6 (11.07.23), #7 (11.07.23), #8 (01.30.24), #9 (03.12.24), #10 (05.01.24), #11 (06.19.24), #12 (08.23.24), #13 (11.12.24) - s/p IVE OS #1 (07.07.23), #2 (08.04.23), #3 (09.22.23), #4 (11.07.23),#5 (11.07.23), #6 (01.30.24), #7 (03.12.24), #8 (05.01.24), #9 (06.19.24), #10 (08.23.24), #11 (11.12.24) ===================================== - exam shows scattered MA/DBH OU,  +edema and exudates - FA (11.14.22) shows vascular perfusion defects OU, no NV OU, OS with focal macroaneurysm along  IT arcades - OCT shows OD: Mild interval increase in IRF / IRHM temporal macula and fovea  ; OS: Stable improvement in IRF/IRHM centrally--residual cystic changes sup mac @ 6wks.  - BCVA OD 20/40, OS 20/40 -- stable OU - recommend IVA OD #8 ONLY, hold OS today(08.12.25) today w/ f/u  back to 5 weeks - pt wishes to proceed with injection OU - RBA of procedure discussed, questions answered - IVA informed consent obtained and re-signed OU, 02.18.25 - see procedure note  - no funding for Good Days - f/u in 5 weeks, DFE, OCT, possible injection(s)  2. Retinal macroaneurysm OS  - focal lesion along IT arcades -- regressed  - s/p IVA OS as above  3,4. Hypertensive retinopathy OU - discussed importance of tight BP control - continue to monitor  5.  Mixed Cataract OU  - being monitored by Dr. [name] - The symptoms of cataract, surgical options, and treatments and risks were discussed with patient.  - discussed diagnosis and progression - seeing Dr. [name] for cataract surgical evaluation on 08.15.25  Ophthalmic Meds Ordered this visit:  Meds ordered this encounter  Medications   Bevacizumab  (AVASTIN ) SOLN 1.25 mg     Return in about 5 weeks (around 04/11/2024) for NPDR w/ DME, Dilated Exam, OCT, Possible Injxn.  There are no Patient Instructions on file for this visit.  Explained the diagnoses, plan, and follow up with the patient and they expressed understanding.  Patient expressed understanding of the importance of proper follow up care.   This document serves as a record of services personally performed by Redell JUDITHANN Hans, MD, PhD. It was created on their behalf by Avelina Pereyra, COA an ophthalmic technician. The creation of this record is the provider's dictation and/or activities during the visit.   Electronically signed by: Avelina GORMAN Pereyra, COT  03/12/24  1:09 AM     Redell JUDITHANN Hans, M.D., Ph.D. Diseases & Surgery of the Retina and Vitreous [facility]  I have reviewed the above documentation for accuracy and completeness, and I agree with the above. Redell JUDITHANN Hans, M.D., Ph.D. 03/12/24 1:10 AM   Abbreviations: M myopia (nearsighted); A astigmatism; H hyperopia (farsighted); P presbyopia; Mrx spectacle prescription;  CTL contact lenses; OD right eye; OS left eye; OU both eyes  XT exotropia; ET esotropia; PEK punctate epithelial keratitis; PEE punctate epithelial erosions; DES dry eye syndrome; MGD meibomian gland dysfunction; ATs artificial tears; PFAT's preservative free artificial tears; NSC nuclear sclerotic cataract; PSC posterior subcapsular cataract; ERM epi-retinal membrane; PVD posterior vitreous detachment; RD retinal detachment; DM diabetes mellitus; DR diabetic retinopathy; NPDR non-proliferative diabetic retinopathy; PDR proliferative diabetic retinopathy; CSME clinically significant macular edema; DME diabetic macular edema; dbh dot blot hemorrhages; CWS cotton wool spot; POAG primary open angle glaucoma; C/D cup-to-disc ratio; HVF humphrey visual field; GVF goldmann visual field; OCT optical coherence tomography; IOP intraocular pressure; BRVO Branch retinal vein occlusion; CRVO central retinal vein occlusion; CRAO central retinal artery occlusion; BRAO branch retinal artery occlusion; RT retinal tear; SB scleral buckle; PPV pars plana vitrectomy; VH Vitreous hemorrhage; PRP panretinal laser photocoagulation; IVK intravitreal kenalog; VMT vitreomacular traction; MH Macular hole;  NVD neovascularization of the disc; NVE neovascularization elsewhere; AREDS age related eye disease study; ARMD age related macular degeneration; POAG primary open angle glaucoma; EBMD epithelial/anterior basement membrane dystrophy; ACIOL anterior chamber intraocular lens; IOL intraocular lens; PCIOL posterior chamber intraocular lens; Phaco/IOL  phacoemulsification with intraocular lens placement; PRK photorefractive keratectomy; LASIK laser assisted  in situ keratomileusis; HTN hypertension; DM diabetes mellitus; COPD chronic obstructive pulmonary disease

## 2024-02-23 ENCOUNTER — Ambulatory Visit (HOSPITAL_COMMUNITY)
Admission: RE | Admit: 2024-02-23 | Discharge: 2024-02-23 | Disposition: A | Discharge status: Home / Self Care | Source: Ambulatory Visit | Attending: Hematology and Oncology | Admitting: Hematology and Oncology

## 2024-02-23 DIAGNOSIS — K839 Disease of biliary tract, unspecified: Secondary | ICD-10-CM | POA: Diagnosis not present

## 2024-02-23 DIAGNOSIS — R918 Other nonspecific abnormal finding of lung field: Secondary | ICD-10-CM | POA: Diagnosis not present

## 2024-02-23 DIAGNOSIS — I700 Atherosclerosis of aorta: Secondary | ICD-10-CM | POA: Diagnosis not present

## 2024-02-23 DIAGNOSIS — K8020 Calculus of gallbladder without cholecystitis without obstruction: Secondary | ICD-10-CM | POA: Diagnosis not present

## 2024-02-23 MED ORDER — GADOBUTROL 1 MMOL/ML IV SOLN
7.5000 mL | Freq: Once | INTRAVENOUS | Status: AC | PRN
  Administered 2024-02-23: 7.5 mL via INTRAVENOUS

## 2024-02-24 DIAGNOSIS — N184 Chronic kidney disease, stage 4 (severe): Secondary | ICD-10-CM | POA: Diagnosis not present

## 2024-02-24 DIAGNOSIS — E1122 Type 2 diabetes mellitus with diabetic chronic kidney disease: Secondary | ICD-10-CM | POA: Diagnosis not present

## 2024-02-24 DIAGNOSIS — I10 Essential (primary) hypertension: Secondary | ICD-10-CM | POA: Diagnosis not present

## 2024-02-24 DIAGNOSIS — E0840 Diabetes mellitus due to underlying condition with diabetic neuropathy, unspecified: Secondary | ICD-10-CM | POA: Diagnosis not present

## 2024-02-24 DIAGNOSIS — E113313 Type 2 diabetes mellitus with moderate nonproliferative diabetic retinopathy with macular edema, bilateral: Secondary | ICD-10-CM | POA: Diagnosis not present

## 2024-02-24 DIAGNOSIS — E1140 Type 2 diabetes mellitus with diabetic neuropathy, unspecified: Secondary | ICD-10-CM | POA: Diagnosis not present

## 2024-02-29 ENCOUNTER — Encounter: Payer: Self-pay | Admitting: Cardiovascular Disease

## 2024-02-29 NOTE — Telephone Encounter (Signed)
 Nishan, Peter C, MD  Kerrin Elspeth BROCKS, MD; Drena Noel Males, RN Pam can you make sure he has good SL nitroglycerin  and have him call us  if using frequently. He has had orthostatic hypotension although BP ok at CVTS office Can consider imdur if taking nitroglycerin  a lot. His LIMA/SVG OM are not that old and with all his renal failure issues It would take a lot of clinical angina for me to recommend cath Marcey. For this reason I would not do myovue as I would only cath for crescendo clinical angina.  Patient sent mychart message back, instead of returning phone call, after leaving patient a message.

## 2024-03-06 ENCOUNTER — Ambulatory Visit: Payer: Self-pay

## 2024-03-07 ENCOUNTER — Ambulatory Visit (INDEPENDENT_AMBULATORY_CARE_PROVIDER_SITE_OTHER): Admitting: Ophthalmology

## 2024-03-07 ENCOUNTER — Encounter (INDEPENDENT_AMBULATORY_CARE_PROVIDER_SITE_OTHER): Payer: Self-pay | Admitting: Ophthalmology

## 2024-03-07 VITALS — BP 171/71

## 2024-03-07 DIAGNOSIS — H35012 Changes in retinal vascular appearance, left eye: Secondary | ICD-10-CM | POA: Diagnosis not present

## 2024-03-07 DIAGNOSIS — H35033 Hypertensive retinopathy, bilateral: Secondary | ICD-10-CM | POA: Diagnosis not present

## 2024-03-07 DIAGNOSIS — H25813 Combined forms of age-related cataract, bilateral: Secondary | ICD-10-CM | POA: Diagnosis not present

## 2024-03-07 DIAGNOSIS — I10 Essential (primary) hypertension: Secondary | ICD-10-CM

## 2024-03-07 DIAGNOSIS — E113313 Type 2 diabetes mellitus with moderate nonproliferative diabetic retinopathy with macular edema, bilateral: Secondary | ICD-10-CM

## 2024-03-07 MED ORDER — BEVACIZUMAB CHEMO INJECTION 1.25MG/0.05ML SYRINGE FOR KALEIDOSCOPE
1.2500 mg | INTRAVITREAL | Status: AC | PRN
  Administered 2024-03-07 (×2): 1.25 mg via INTRAVITREAL

## 2024-03-10 DIAGNOSIS — H2513 Age-related nuclear cataract, bilateral: Secondary | ICD-10-CM | POA: Diagnosis not present

## 2024-03-10 DIAGNOSIS — E113313 Type 2 diabetes mellitus with moderate nonproliferative diabetic retinopathy with macular edema, bilateral: Secondary | ICD-10-CM | POA: Diagnosis not present

## 2024-03-13 ENCOUNTER — Telehealth: Payer: Self-pay | Admitting: Neurology

## 2024-03-13 NOTE — Telephone Encounter (Signed)
 Needs Rx refill gabapentin  (NEURONTIN ) 300 MG capsule [[id number]]

## 2024-03-13 NOTE — Telephone Encounter (Signed)
 Left message on machine for patient to call back.

## 2024-03-14 NOTE — Telephone Encounter (Signed)
 Pt needs a refill on the Gabapentin   called into the [employer] [employer] patient back to sch a follow up with him and had to leave a VM

## 2024-03-14 NOTE — Telephone Encounter (Signed)
 He is overdue for follow-up visit.  Please see if patient is available for follow-up on 8/26, 8/27, or 9/23.  We will send refills to get him to his next follow-up visit, or he can request from PCP who may have seen him more recently.

## 2024-03-15 NOTE — Telephone Encounter (Signed)
 Called patient and left a message per DPR that we received his refill request and before we can fill that we need him to contact our office to get a follow up scheduled. Left our office contact number for patient to call.

## 2024-03-17 ENCOUNTER — Encounter: Payer: Self-pay | Admitting: Neurology

## 2024-03-23 ENCOUNTER — Telehealth: Payer: Self-pay | Admitting: Neurology

## 2024-03-23 DIAGNOSIS — E0840 Diabetes mellitus due to underlying condition with diabetic neuropathy, unspecified: Secondary | ICD-10-CM | POA: Diagnosis not present

## 2024-03-23 DIAGNOSIS — N184 Chronic kidney disease, stage 4 (severe): Secondary | ICD-10-CM | POA: Diagnosis not present

## 2024-03-23 DIAGNOSIS — I10 Essential (primary) hypertension: Secondary | ICD-10-CM | POA: Diagnosis not present

## 2024-03-23 DIAGNOSIS — E113313 Type 2 diabetes mellitus with moderate nonproliferative diabetic retinopathy with macular edema, bilateral: Secondary | ICD-10-CM | POA: Diagnosis not present

## 2024-03-23 MED ORDER — GABAPENTIN 300 MG PO CAPS: 600.0000 mg | ORAL_CAPSULE | Freq: Two times a day (BID) | ORAL | 0 refills | Status: DC

## 2024-03-23 NOTE — Telephone Encounter (Signed)
 Patient notified via Mychart.

## 2024-03-23 NOTE — Telephone Encounter (Signed)
 Gabapentin refilled

## 2024-03-23 NOTE — Telephone Encounter (Signed)
 Pt has appt with Tobie on 06-12-24 at 9:30 and would like to get a refill on the Gabapentin  called into [facility] on [address]

## 2024-03-26 DIAGNOSIS — E1140 Type 2 diabetes mellitus with diabetic neuropathy, unspecified: Secondary | ICD-10-CM | POA: Diagnosis not present

## 2024-03-26 DIAGNOSIS — E1122 Type 2 diabetes mellitus with diabetic chronic kidney disease: Secondary | ICD-10-CM | POA: Diagnosis not present

## 2024-03-26 DIAGNOSIS — N184 Chronic kidney disease, stage 4 (severe): Secondary | ICD-10-CM | POA: Diagnosis not present

## 2024-03-26 DIAGNOSIS — E0840 Diabetes mellitus due to underlying condition with diabetic neuropathy, unspecified: Secondary | ICD-10-CM | POA: Diagnosis not present

## 2024-03-28 NOTE — Progress Notes (Signed)
 [facility] - Clinic Note  04/11/2024    CHIEF COMPLAINT Patient presents for Retina Follow Up  HISTORY OF PRESENT ILLNESS: Chad [name] is a 78 y.o. male who presents to the clinic today for:   HPI     Retina Follow Up   Patient presents with  Diabetic Retinopathy.  In both eyes.  This started 5 weeks ago.  Duration of 5 weeks.  Since onset it is stable.  I, the attending physician,  performed the HPI with the patient and updated documentation appropriately.        Comments   5 week retina follow up NPDR ou and IVA OD pt is reporting no vision changes noticed he denies any flashes or floaters he is having cat surgery in October on OD  at [facility]  last reading 112      Last edited by Valdemar Rogue, MD on 04/18/2024 10:05 AM.     Pt states he feels VA is stable.   Referring physician: [name], MD [address] [place],  [place] [address]  HISTORICAL INFORMATION:   Selected notes from the MEDICAL RECORD NUMBER Referred by Dr. [name] for diabetic retinopathy with DME OU [name]:  Ocular Hx- PMH-    CURRENT MEDICATIONS: No current outpatient medications on file. (Ophthalmic Drugs)   No current facility-administered medications for this visit. (Ophthalmic Drugs)   Current Outpatient Medications (Other)  Medication Sig   amLODipine  (NORVASC ) 5 MG tablet Take 1 tablet (5 mg total) by mouth daily.   aspirin  EC 81 MG tablet Take 81 mg by mouth daily. Swallow whole.   B-D ULTRAFINE III SHORT PEN 31G X 8 MM MISC SMARTSIG:1 Each SUB-Q Daily   cyanocobalamin  (VITAMIN B12) 1000 MCG tablet Take 1 tablet (1,000 mcg total) by mouth daily.   gabapentin  (NEURONTIN ) 300 MG capsule Take 2 capsules (600 mg total) by mouth 2 (two) times daily.   insulin  glargine (LANTUS ) 100 UNIT/ML injection Inject 14 Units into the skin at bedtime.   JARDIANCE  10 MG TABS tablet Take 10 mg by mouth daily.   levothyroxine  (SYNTHROID ) 100 MCG tablet Take  1 tablet (100 mcg total) by mouth daily before breakfast.   metoprolol  succinate (TOPROL  XL) 25 MG 24 hr tablet Take 0.5 tablets (12.5 mg total) by mouth daily.   nitroGLYCERIN  (NITROSTAT ) 0.4 MG SL tablet Place 1 tablet (0.4 mg total) under the tongue every 5 (five) minutes as needed for chest pain.   ONETOUCH VERIO test strip    polyethylene glycol (MIRALAX  / GLYCOLAX ) 17 g packet Take 17 g by mouth as needed for moderate constipation.   rosuvastatin  (CRESTOR ) 20 MG tablet Take 20 mg by mouth daily.   spironolactone (ALDACTONE) 25 MG tablet Take 12.5 mg by mouth daily.   No current facility-administered medications for this visit. (Other)   REVIEW OF SYSTEMS: ROS   Positive for: Endocrine, Cardiovascular, Eyes Negative for: Constitutional, Gastrointestinal, Neurological, Skin, Genitourinary, Musculoskeletal, HENT, Respiratory, Psychiatric, Allergic/Imm, Heme/Lymph Last edited by Resa Delon ORN, COT on 04/11/2024  2:02 PM.     ALLERGIES Allergies  Allergen Reactions   Pork-Derived Products Other (See Comments)    PAST MEDICAL HISTORY Past Medical History:  Diagnosis Date   Cataract    CKD (chronic kidney disease)    Colon polyps    Diabetes (HCC)    Diabetic polyneuropathy associated with type 2 diabetes mellitus (HCC) 01/15/2017   Diabetic retinopathy (HCC)    GERD (gastroesophageal reflux  disease)    Hypercholesterolemia    Hypertension    Hypertensive retinopathy    Hypothyroidism    Polyneuropathy    Proteinuria    Severe aortic stenosis    Past Surgical History:  Procedure Laterality Date   CARDIAC SURGERY     IR THORACENTESIS ASP PLEURAL SPACE W/IMG GUIDE  09/29/2023   IR THORACENTESIS ASP PLEURAL SPACE W/IMG GUIDE  12/16/2023   RIGHT/LEFT HEART CATH AND CORONARY ANGIOGRAPHY N/A 05/21/2023   Procedure: RIGHT/LEFT HEART CATH AND CORONARY ANGIOGRAPHY;  Surgeon: [name], MD;  Location: [facility];  Service: Cardiovascular;  Laterality: N/A;     FAMILY HISTORY Family History  Problem Relation Age of Onset   Stroke Father    Glaucoma Father    SOCIAL HISTORY Social History   Tobacco Use   Smoking status: Former   Smokeless tobacco: Never  Advertising account planner   Vaping status: Never Used  Substance Use Topics   Alcohol use: Yes   Drug use: No       OPHTHALMIC EXAM: Base Eye Exam     Visual Acuity (Snellen - Linear)       Right Left   Dist [place] 20/40 20/40   Dist ph [place] 20/30 -3 20/30 -2         Tonometry (Tonopen, 2:07 PM)       Right Left   Pressure 15 19         Pupils       Pupils Dark Light Shape React APD   Right PERRL 3 2 Round Brisk None   Left PERRL 3 2 Round Brisk None         Visual Fields       Left Right    Full Full         Extraocular Movement       Right Left    Full, Ortho Full, Ortho         Neuro/Psych     Oriented x3: Yes   Mood/Affect: Normal         Dilation     Both eyes: 2.5% Phenylephrine @ 2:07 PM           Slit Lamp and Fundus Exam     External Exam       Right Left   External Normal Normal         Slit Lamp Exam       Right Left   Lids/Lashes Dermatochalasis - upper lid, mild Telangiectasia Dermatochalasis - upper lid, Meibomian gland dysfunction   Conjunctiva/Sclera White and quiet White and quiet   Cornea 2+ fine, inferior Punctate epithelial erosions 1+ Punctate epithelial erosions, mild tear film debris   Anterior Chamber deep, clear, narrow angles deep, clear, narrow angles   Iris Round and dilated, mild anterior bowing, No NVI Round and dilated, mild anterior bowing, No NVI   Lens 3+ Nuclear sclerosis w/ brunescence, 3+ Cortical cataract 3+ Nuclear sclerosis w/ brunescence, 3+ Cortical cataract   Anterior Vitreous Vitreous syneresis Vitreous syneresis         Fundus Exam       Right Left   Disc Pink and Sharp, no NVD Pink and Sharp, no NVD   C/D Ratio 0.3 0.3   Macula good foveal reflex, mild ERM, scattered MA and exudates--  stably improved, central cystic changes--improved. Flat, good foveal reflex, minimal MA and focal exudates, trace cystic changes--slightly increased   Vessels attenuated, Tortuous attenuated, Tortuous, Copper  wiring, Macroaneurysm /  CWS along IT arcades - improving   Periphery Attached, scattered MA and CWS greatest posteriorly Attached, scattered MA/DBH greatest posteriorly, focal CWS inferior to IT arcades--fading           IMAGING AND PROCEDURES  Imaging and Procedures for 04/11/2024  OCT, Retina - OU - Both Eyes       Right Eye Quality was good. Central Foveal Thickness: 322. Progression has improved. Findings include normal foveal contour, no SRF, intraretinal hyper-reflective material, epiretinal membrane, intraretinal fluid, macular pucker (Mild interval improvement in IRF / IRHM temporal macula and fovea ).   Left Eye Quality was good. Central Foveal Thickness: 276. Progression has worsened. Findings include normal foveal contour, no SRF, intraretinal hyper-reflective material, intraretinal fluid (Mild interval increase in IRF/IRHM nasal fovea and mac. ).   Notes *Images captured and stored on drive  Diagnosis / Impression:  +DME OU OD: Mild interval improvement in IRF / IRHM temporal macula and fovea  OS: Mild interval increase in IRF/IRHM nasal fovea and mac.    Clinical management:  See below  Abbreviations: NFP - Normal foveal profile. CME - cystoid macular edema. PED - pigment epithelial detachment. IRF - intraretinal fluid. SRF - subretinal fluid. EZ - ellipsoid zone. ERM - epiretinal membrane. ORA - outer retinal atrophy. ORT - outer retinal tubulation. SRHM - subretinal hyper-reflective material. IRHM - intraretinal hyper-reflective material      Intravitreal Injection, Pharmacologic Agent - OD - Right Eye       Time Out 04/11/2024. 3:33 PM. Confirmed correct patient, procedure, site, and patient consented.   Anesthesia Topical anesthesia was used. Anesthetic  medications included Lidocaine  2%, Proparacaine 0.5%.   Procedure Preparation included 5% betadine to ocular surface, eyelid speculum. A supplied (32g) needle was used.   Injection: 1.25 mg Bevacizumab  1.25mg /0.78ml   Route: Intravitreal, Site: Right Eye   NDC: H525437, Lot: [id number]$ , Expiration date: 04/28/2024   Post-op Post injection exam found visual acuity of at least counting fingers. The patient tolerated the procedure well. There were no complications. The patient received written and verbal post procedure care education. Post injection medications were not given.      Intravitreal Injection, Pharmacologic Agent - OS - Left Eye       Time Out 04/11/2024. 3:34 PM. Confirmed correct patient, procedure, site, and patient consented.   Anesthesia Topical anesthesia was used. Anesthetic medications included Lidocaine  2%, Proparacaine 0.5%.   Procedure Preparation included 5% betadine to ocular surface, eyelid speculum. A (32g) needle was used.   Injection: 1.25 mg Bevacizumab  1.25mg /0.28ml   Route: Intravitreal, Site: Left Eye   NDC: H525437, Lot: [id number], Expiration date: 06/22/2024   Post-op Post injection exam found visual acuity of at least counting fingers. The patient tolerated the procedure well. There were no complications. The patient received written and verbal post procedure care education. Post injection medications were not given.             ASSESSMENT/PLAN:    ICD-10-CM   1. Moderate nonproliferative diabetic retinopathy of both eyes with macular edema associated with type 2 diabetes mellitus (HCC)  E11.3313 OCT, Retina - OU - Both Eyes    Intravitreal Injection, Pharmacologic Agent - OD - Right Eye    Intravitreal Injection, Pharmacologic Agent - OS - Left Eye    Bevacizumab  (AVASTIN ) SOLN 1.25 mg    Bevacizumab  (AVASTIN ) SOLN 1.25 mg    2. 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 Essential hypertension  I10  4.  Hypertensive retinopathy of both eyes  H35.033     5. Combined forms of age-related cataract of both eyes  H25.813      1. Moderate Non-proliferative diabetic retinopathy w/ DME OU  - s/p IVA OD #1 (11.17.22), #2 (12.15.22), #3 (01.12.23), #4 (02.13.23), #5 (03.16.23), #6 (04.12.23), #7 (05.20.25), #8 (07.01.25), #9 (08.12.25) - s/p IVA OS #1 (11.14.22), #2 (12.15.22), #3 (01.12.23), #4 (02.13.23), #5 (03.16.23), #6 (04.12.23), #7 (05.12.23), #8 (06.09.23), #9 (02.18.25), #10 (05.20.25), #11 (07.01.25) ================================ - s/p IVE OD #1 (05.12.23) -- sample, #2 (06.09.23), #3 (07.07.23), #4 (08.04.23), #5 (09.22.23), #6 (11.07.23), #7 (11.07.23), #8 (01.30.24), #9 (03.12.24), #10 (05.01.24), #11 (06.19.24), #12 (08.23.24), #13 (11.12.24) - s/p IVE OS #1 (07.07.23), #2 (08.04.23), #3 (09.22.23), #4 (11.07.23),#5 (11.07.23), #6 (01.30.24), #7 (03.12.24), #8 (05.01.24), #9 (06.19.24), #10 (08.23.24), #11 (11.12.24) ===================================== - exam shows scattered MA/DBH OU, +edema and exudates - FA (11.14.22) shows vascular perfusion defects OU, no NV OU, OS with focal macroaneurysm along IT arcades - OCT shows OD: Mild interval improvement in IRF / IRHM temporal macula and fovea at 5 wks; OS: Mild interval increase in IRF/IRHM nasal fovea and mac @ 11 wks since last injxn.  - BCVA OU 20/30-improved from 20/40 - recommend IVA OU (OD #10 and OS #12) today 09.16.25 w/ f/u at 5 weeks - pt wishes to proceed with injection OU--working plan will be IVA OS ~every other visit - RBA of procedure discussed, questions answered - IVA informed consent obtained and re-signed OU, 02.18.25 - see procedure note  - no funding for Good Days - f/u in 5 weeks, DFE, OCT, possible injection(s)  2. Retinal macroaneurysm OS  - focal lesion along IT arcades -- regressed  - s/p IVA OS as above  3,4. Hypertensive retinopathy OU - discussed importance of tight BP control - continue to  monitor  5.  Mixed Cataract OU  - being monitored by Dr. [name] - The symptoms of cataract, surgical options, and treatments and risks were discussed with patient.  - discussed diagnosis and progression - seeing Dr. [name] for cataract surgery on 10.09.25 then 10.16.25 (OD then OS)  Ophthalmic Meds Ordered this visit:  Meds ordered this encounter  Medications   Bevacizumab  (AVASTIN ) SOLN 1.25 mg   Bevacizumab  (AVASTIN ) SOLN 1.25 mg     Return in about 5 weeks (around 05/16/2024) for NPDR OU, DFE, OCT, Possible Injxn.  There are no Patient Instructions on file for this visit.  Explained the diagnoses, plan, and follow up with the patient and they expressed understanding.  Patient expressed understanding of the importance of proper follow up care.   This document serves as a record of services personally performed by Redell JUDITHANN Hans, MD, PhD. It was created on their behalf by Wanda GEANNIE Keens, COT an ophthalmic technician. The creation of this record is the provider's dictation and/or activities during the visit.    Electronically signed by:  Wanda GEANNIE Keens, COT  04/18/24 10:25 AM  This document serves as a record of services personally performed by Redell JUDITHANN Hans, MD, PhD. It was created on their behalf by Almetta Pesa, an ophthalmic technician. The creation of this record is the provider's dictation and/or activities during the visit.    Electronically signed by: Almetta Pesa, OA, 04/18/24  10:25 AM  Redell JUDITHANN Hans, M.D., Ph.D. Diseases & Surgery of the Retina and Vitreous [facility]  I have reviewed the above documentation for accuracy and completeness, and I  agree with the above. Redell JUDITHANN Hans, M.D., Ph.D. 04/18/24 10:31 AM   Abbreviations: M myopia (nearsighted); A astigmatism; H hyperopia (farsighted); P presbyopia; Mrx spectacle prescription;  CTL contact lenses; OD right eye; OS left eye; OU both eyes  XT exotropia; ET esotropia;  PEK punctate epithelial keratitis; PEE punctate epithelial erosions; DES dry eye syndrome; MGD meibomian gland dysfunction; ATs artificial tears; PFAT's preservative free artificial tears; NSC nuclear sclerotic cataract; PSC posterior subcapsular cataract; ERM epi-retinal membrane; PVD posterior vitreous detachment; RD retinal detachment; DM diabetes mellitus; DR diabetic retinopathy; NPDR non-proliferative diabetic retinopathy; PDR proliferative diabetic retinopathy; CSME clinically significant macular edema; DME diabetic macular edema; dbh dot blot hemorrhages; CWS cotton wool spot; POAG primary open angle glaucoma; C/D cup-to-disc ratio; HVF humphrey visual field; GVF goldmann visual field; OCT optical coherence tomography; IOP intraocular pressure; BRVO Branch retinal vein occlusion; CRVO central retinal vein occlusion; CRAO central retinal artery occlusion; BRAO branch retinal artery occlusion; RT retinal tear; SB scleral buckle; PPV pars plana vitrectomy; VH Vitreous hemorrhage; PRP panretinal laser photocoagulation; IVK intravitreal kenalog; VMT vitreomacular traction; MH Macular hole;  NVD neovascularization of the disc; NVE neovascularization elsewhere; AREDS age related eye disease study; ARMD age related macular degeneration; POAG primary open angle glaucoma; EBMD epithelial/anterior basement membrane dystrophy; ACIOL anterior chamber intraocular lens; IOL intraocular lens; PCIOL posterior chamber intraocular lens; Phaco/IOL phacoemulsification with intraocular lens placement; PRK photorefractive keratectomy; LASIK laser assisted in situ keratomileusis; HTN hypertension; DM diabetes mellitus; COPD chronic obstructive pulmonary disease

## 2024-04-03 NOTE — Progress Notes (Deleted)
 Cardiology Office Note:    Date:  04/03/2024   ID:  Chad [name], DOB July 20, 1946, MRN [id number]  PCP:  [name], MD   [facility] Providers Cardiologist:  Maude Emmer, MD Electrophysiologist:  Fonda Kitty, MD     Referring MD: [name], MD   No chief complaint on file.  History of Present Illness:    Chad [name] is a 78 y.o. male with a hx of HTN, HLD, CKD IV (baseline Cr 1.8), and DM2 who was recently found to have severe AS and 3V CAD who presents for post-CABG/AVR (Dr. Vinita at [facility]) follow up.   Chad [name]  was referred to Dr. Wendel for possible TAVR given new, symptomatic severe aortic stenosis. Unfortunately pre TAVR cardiac cath showed significant CAD with LM equivalent. I felt that CABG/AVR would be more suitable than TAVR. However CRF and risk of dialysis post pump run raised concerns Ultimately he ended up having CABG/AVR at [facility]  Surgical details 25 mm Perceval rapid deployment bovine pericardial valve and CABG with LIMA to LAD, SVG OM Hospitalized for 11 days due to ARF/CRF PAF and urinary retention   Course was complicated by significant AKI with a peak Cr >5.0, urinary retention requiring catheter placement followed by urology, and new post-op atrial fibrillation with significant ventricular ectopy treated with amiodarone  and mexiletine with plans for EP referral. Not anticoagulated.   Son is a GI physician in [place] . Patient sees Dr Dalene from [facility]. Cr improved to 2.8 with K 3.5 and BUN 43 on 08/31/23  Urinating a lot. Sternum healing Has referral to cardiac rehab at [facility] Beta blocker reduced for bradycardia only 12.5 mg Toprol  at night   He has had left pleural effusion drained twice. Last thoracentesis was 09/29/23  Last CXR 10/25/23 with moderate recurrence Was to be referred to CVTS for further management and possible Pleur X catheter he has been on lasix  60 mg daily and hesitant to increase further for effusion  given his CRF.   Echo done 10/01/23 post AVR with EF 60-65% Normal RV AVR with mean gradient 10 peak 17.5 mmhg DVI 0.64 AVA 2.7 cm2  Monitor 11/15/23: average HR 55 bpm range 46-74 bpm PAC/PVC < 1% and no PAF   Sees Dr Kerrin next week for effusion will update CXR with left lateral decubitus  Past Medical History:  Diagnosis Date   Cataract    CKD (chronic kidney disease)    Colon polyps    Diabetes (HCC)    Diabetic polyneuropathy associated with type 2 diabetes mellitus (HCC) 01/15/2017   Diabetic retinopathy (HCC)    GERD (gastroesophageal reflux disease)    Hypercholesterolemia    Hypertension    Hypertensive retinopathy    Hypothyroidism    Polyneuropathy    Proteinuria    Severe aortic stenosis     Past Surgical History:  Procedure Laterality Date   CARDIAC SURGERY     IR THORACENTESIS ASP PLEURAL SPACE W/IMG GUIDE  09/29/2023   IR THORACENTESIS ASP PLEURAL SPACE W/IMG GUIDE  12/16/2023   RIGHT/LEFT HEART CATH AND CORONARY ANGIOGRAPHY N/A 05/21/2023   Procedure: RIGHT/LEFT HEART CATH AND CORONARY ANGIOGRAPHY;  Surgeon: [name], MD;  Location: [facility]VASIVE [facility] LAB;  Service: Cardiovascular;  Laterality: N/A;    Current Medications: No outpatient medications have been marked as taking for the 04/14/24 encounter (Appointment) with Dymir Neeson C, MD.     Allergies:   Pork-derived products   Social History  Socioeconomic History   Marital status: Married    Spouse name: Not on file   Number of children: 2   Years of education: college   Highest education level: Not on file  Occupational History   Occupation: Health visitor  Tobacco Use   Smoking status: Former   Smokeless tobacco: Never  Advertising account planner   Vaping status: Never Used  Substance and Sexual Activity   Alcohol use: Yes   Drug use: No   Sexual activity: Not Currently  Other Topics Concern   Not on file  Social History Narrative   Patient lives with wife in a 2 story home.  Has 2  children.  Works as a Health visitor.  Education: college.       Right handed   Social Drivers of Health   Financial Resource Strain: Low Risk  (08/11/2023)   Received from [facility]   Overall Financial Resource Strain (CARDIA)    Difficulty of Paying Living Expenses: Not hard at all  Food Insecurity: No Food Insecurity (01/06/2024)   Hunger Vital Sign    Worried About Running Out of Food in the Last Year: Never true    Ran Out of Food in the Last Year: Never true  Transportation Needs: No Transportation Needs (09/29/2023)   PRAPARE - Administrator, Civil Service (Medical): No    Lack of Transportation (Non-Medical): No  Physical Activity: Not on file  Stress: Not on file  Social Connections: Socially Integrated (09/29/2023)   Social Connection and Isolation Panel    Frequency of Communication with Friends and Family: More than three times a week    Frequency of Social Gatherings with Friends and Family: More than three times a week    Attends Religious Services: More than 4 times per year    Active Member of [employer] or Organizations: No    Attends Engineer, structural: 1 to 4 times per year    Marital Status: Married    Family History: The patient's family history includes Glaucoma in his father; Stroke in his father.  ROS:   Please see the history of present illness.     All other systems reviewed and are negative.  EKGs/Labs/Other Studies Reviewed:    Monitor 11/15/23 Study Highlights    Patch Wear Time:  13 days and 23 hours (2025-03-28T16:10:11-398 to 2025-04-11T16:09:57-0400)   Patient had a min HR of 46 bpm, max HR of 74 bpm, and avg HR of 55 bpm. Predominant underlying rhythm was Sinus Rhythm. Isolated SVEs were rare (<1.0%), SVE Couplets were rare (<1.0%), and no SVE Triplets were present. Isolated VEs were occasional (1.1%,  [id number]), VE Couplets were rare (<1.0%, 2), and no VE Triplets were present. Ventricular  Bigeminy was present.   Echocardiogram 10/01/23 IMPRESSIONS     1. Left ventricular ejection fraction, by estimation, is 60 to 65%. The  left ventricle has normal function. The left ventricle has no regional  wall motion abnormalities. Left ventricular diastolic parameters were  normal.   2. Right ventricular systolic function is normal. The right ventricular  size is normal.   3. The mitral valve is abnormal. No evidence of mitral valve  regurgitation. No evidence of mitral stenosis.   4. Post AVR with hybrid 25 mm Liva Nova Perceval rapid deployment valve  normal gradients and no PVL. The aortic valve has been repaired/replaced.  Aortic valve regurgitation is not visualized. No aortic stenosis is  present. There is a 25  mm Liva Nova  Perceval rapid deployment/sutureless bovine pericardial valve valve  present in the aortic position. Procedure Date: 08/10/23.   5. The inferior vena cava is normal in size with greater than 50%  respiratory variability, suggesting right atrial pressure of 3 mmHg.    AORTIC VALVE  AV Area (Vmax):    2.17 cm  AV Area (Vmean):   2.33 cm  AV Area (VTI):     2.67 cm  AV Vmax:           209.00 cm/s  AV Vmean:          151.000 cm/s  AV VTI:            0.407 m  AV Peak Grad:      17.5 mmHg  AV Mean Grad:      10.0 mmHg  LVOT Vmax:         109.00 cm/s  LVOT Vmean:        84.700 cm/s  LVOT VTI:          0.262 m  LVOT/AV VTI ratio: 0.64   Recent Labs: 09/30/2023: TSH 21.123 10/01/2023: B Natriuretic Peptide 354.5; Magnesium 2.0 01/06/2024: ALT 16; BUN 47; Creatinine 2.81; Hemoglobin 10.1; Platelet Count 96; Potassium 4.5; Sodium 136  Recent Lipid Panel No results found for: CHOL, TRIG, HDL, CHOLHDL, VLDL, LDLCALC, LDLDIRECT Risk Assessment/Calculations:    CHA2DS2-VASc Score = 5    This indicates a 7.2% annual risk of stroke. The patient's score is based upon: CHF History: 0 HTN History: 1 Diabetes History: 1 Stroke History:  0 Vascular Disease History: 1 Age Score: 2 Gender Score: 0   Physical Exam:    VS:  There were no vitals taken for this visit.    Wt Readings from Last 3 Encounters:  02/22/24 181 lb 4.8 oz (82.2 kg)  01/11/24 178 lb (80.7 kg)  01/10/24 177 lb 4 oz (80.4 kg)    Elderly male Post sternotomy SEM through AVR Abdomen benign Lungs decreased BS/effusion left base/mid Plus one LE edema  ASSESSMENT/PLAN:    CAD s/p CABG/AVR: 25 mm Percael rapid deployment valve and CABG with LIMA to LAD and SVG OM. Continue ASA, lopressor  Has had some orthostasis so no further Rx  TTE 10/01/23 with EF 60-65% AVR with normal mean gradient 10 mmHg , Peak 17.5 mmHg no PVL  Post op atrial fibrillation/ventricular ectopy: Dr Waddell suggested weaning amiodarone  and d/c mexiletine due to renal issues Had monitor  Placed at [facility] 11/21/23  In NSR and not anticoagulated due to brief post op nature of PAF Off both now  Discussed taking pulse and using pulse oximeter to check for recurrence as well Beta blocker reduced for bradycardia Toprol  12.5 mg once/day now   CKD stage IV complicated by post-op AKI: Peak Cr during hospital course >5 with a discharge Cr 1/25 at 4.7. Most recent 01/06/24 2.8   Urinary retention: Requiring urinary catheter however removed after able to void. No further issues and son reports no follow up required.   HTN:  post op orthostatism only on beta blocker now    DM2: Continue insulin , jardiance . Follows with Dr. Rexanne this week.   Pleural Effusion:  thoracentesis x 2 with recurrence by CXR 10/25/23 Last IR thoracentesis was 09/29/23  CXR 02/22/24 no change small left pleural effusion    F/U Nephrology F/U Kerrin   F/U with me in 6 months   Signed, Maude Emmer, MD  04/03/2024 11:49 AM    [facility]

## 2024-04-05 DIAGNOSIS — K432 Incisional hernia without obstruction or gangrene: Secondary | ICD-10-CM | POA: Diagnosis not present

## 2024-04-05 DIAGNOSIS — E039 Hypothyroidism, unspecified: Secondary | ICD-10-CM | POA: Diagnosis not present

## 2024-04-05 DIAGNOSIS — Z8709 Personal history of other diseases of the respiratory system: Secondary | ICD-10-CM | POA: Diagnosis not present

## 2024-04-11 ENCOUNTER — Encounter (INDEPENDENT_AMBULATORY_CARE_PROVIDER_SITE_OTHER): Payer: Self-pay | Admitting: Ophthalmology

## 2024-04-11 ENCOUNTER — Ambulatory Visit (INDEPENDENT_AMBULATORY_CARE_PROVIDER_SITE_OTHER): Admitting: Ophthalmology

## 2024-04-11 DIAGNOSIS — I10 Essential (primary) hypertension: Secondary | ICD-10-CM | POA: Diagnosis not present

## 2024-04-11 DIAGNOSIS — H35033 Hypertensive retinopathy, bilateral: Secondary | ICD-10-CM

## 2024-04-11 DIAGNOSIS — H25813 Combined forms of age-related cataract, bilateral: Secondary | ICD-10-CM

## 2024-04-11 DIAGNOSIS — E113313 Type 2 diabetes mellitus with moderate nonproliferative diabetic retinopathy with macular edema, bilateral: Secondary | ICD-10-CM

## 2024-04-11 DIAGNOSIS — H35012 Changes in retinal vascular appearance, left eye: Secondary | ICD-10-CM

## 2024-04-12 ENCOUNTER — Encounter (HOSPITAL_BASED_OUTPATIENT_CLINIC_OR_DEPARTMENT_OTHER): Payer: Self-pay

## 2024-04-14 ENCOUNTER — Ambulatory Visit: Admitting: Cardiovascular Disease

## 2024-04-18 ENCOUNTER — Encounter (INDEPENDENT_AMBULATORY_CARE_PROVIDER_SITE_OTHER): Payer: Self-pay | Admitting: Ophthalmology

## 2024-04-18 MED ORDER — BEVACIZUMAB CHEMO INJECTION 1.25MG/0.05ML SYRINGE FOR KALEIDOSCOPE
1.2500 mg | INTRAVITREAL | Status: AC | PRN
  Administered 2024-04-18: 1.25 mg via INTRAVITREAL

## 2024-04-22 DIAGNOSIS — I10 Essential (primary) hypertension: Secondary | ICD-10-CM | POA: Diagnosis not present

## 2024-04-22 DIAGNOSIS — N184 Chronic kidney disease, stage 4 (severe): Secondary | ICD-10-CM | POA: Diagnosis not present

## 2024-04-22 DIAGNOSIS — E113313 Type 2 diabetes mellitus with moderate nonproliferative diabetic retinopathy with macular edema, bilateral: Secondary | ICD-10-CM | POA: Diagnosis not present

## 2024-04-25 DIAGNOSIS — E113313 Type 2 diabetes mellitus with moderate nonproliferative diabetic retinopathy with macular edema, bilateral: Secondary | ICD-10-CM | POA: Diagnosis not present

## 2024-04-25 DIAGNOSIS — E1122 Type 2 diabetes mellitus with diabetic chronic kidney disease: Secondary | ICD-10-CM | POA: Diagnosis not present

## 2024-04-25 DIAGNOSIS — E1140 Type 2 diabetes mellitus with diabetic neuropathy, unspecified: Secondary | ICD-10-CM | POA: Diagnosis not present

## 2024-04-25 DIAGNOSIS — I10 Essential (primary) hypertension: Secondary | ICD-10-CM | POA: Diagnosis not present

## 2024-04-25 DIAGNOSIS — N184 Chronic kidney disease, stage 4 (severe): Secondary | ICD-10-CM | POA: Diagnosis not present

## 2024-05-04 DIAGNOSIS — H2512 Age-related nuclear cataract, left eye: Secondary | ICD-10-CM | POA: Diagnosis not present

## 2024-05-04 DIAGNOSIS — Z961 Presence of intraocular lens: Secondary | ICD-10-CM | POA: Diagnosis not present

## 2024-05-04 DIAGNOSIS — H25812 Combined forms of age-related cataract, left eye: Secondary | ICD-10-CM | POA: Diagnosis not present

## 2024-05-11 DIAGNOSIS — H2511 Age-related nuclear cataract, right eye: Secondary | ICD-10-CM | POA: Diagnosis not present

## 2024-05-11 DIAGNOSIS — Z961 Presence of intraocular lens: Secondary | ICD-10-CM | POA: Diagnosis not present

## 2024-05-11 DIAGNOSIS — H25811 Combined forms of age-related cataract, right eye: Secondary | ICD-10-CM | POA: Diagnosis not present

## 2024-05-15 NOTE — Progress Notes (Shared)
 [facility] - Clinic Note  05/17/2024    CHIEF COMPLAINT Patient presents for No chief complaint on file.  HISTORY OF PRESENT ILLNESS: Chad [name] is a 78 y.o. male who presents to the clinic today for:     Pt states   Referring physician: [name], MD [address] [place],  [place] [address]  HISTORICAL INFORMATION:   Selected notes from the MEDICAL RECORD NUMBER Referred by Dr. [name] for diabetic retinopathy with DME OU [name]:  Ocular Hx- PMH-    CURRENT MEDICATIONS: No current outpatient medications on file. (Ophthalmic Drugs)   No current facility-administered medications for this visit. (Ophthalmic Drugs)   Current Outpatient Medications (Other)  Medication Sig   amLODipine  (NORVASC ) 5 MG tablet Take 1 tablet (5 mg total) by mouth daily.   aspirin  EC 81 MG tablet Take 81 mg by mouth daily. Swallow whole.   B-D ULTRAFINE III SHORT PEN 31G X 8 MM MISC SMARTSIG:1 Each SUB-Q Daily   cyanocobalamin  (VITAMIN B12) 1000 MCG tablet Take 1 tablet (1,000 mcg total) by mouth daily.   gabapentin  (NEURONTIN ) 300 MG capsule Take 2 capsules (600 mg total) by mouth 2 (two) times daily.   insulin  glargine (LANTUS ) 100 UNIT/ML injection Inject 14 Units into the skin at bedtime.   JARDIANCE  10 MG TABS tablet Take 10 mg by mouth daily.   levothyroxine  (SYNTHROID ) 100 MCG tablet Take 1 tablet (100 mcg total) by mouth daily before breakfast.   metoprolol  succinate (TOPROL  XL) 25 MG 24 hr tablet Take 0.5 tablets (12.5 mg total) by mouth daily.   nitroGLYCERIN  (NITROSTAT ) 0.4 MG SL tablet Place 1 tablet (0.4 mg total) under the tongue every 5 (five) minutes as needed for chest pain.   ONETOUCH VERIO test strip    polyethylene glycol (MIRALAX  / GLYCOLAX ) 17 g packet Take 17 g by mouth as needed for moderate constipation.   rosuvastatin  (CRESTOR ) 20 MG tablet Take 20 mg by mouth daily.   spironolactone (ALDACTONE) 25 MG tablet Take 12.5 mg by mouth  daily.   No current facility-administered medications for this visit. (Other)   REVIEW OF SYSTEMS:   ALLERGIES Allergies  Allergen Reactions   Porcine (Pork) Protein-Containing Drug Products Other (See Comments)    PAST MEDICAL HISTORY Past Medical History:  Diagnosis Date   Cataract    CKD (chronic kidney disease)    Colon polyps    Diabetes (HCC)    Diabetic polyneuropathy associated with type 2 diabetes mellitus (HCC) 01/15/2017   Diabetic retinopathy (HCC)    GERD (gastroesophageal reflux disease)    Hypercholesterolemia    Hypertension    Hypertensive retinopathy    Hypothyroidism    Polyneuropathy    Proteinuria    Severe aortic stenosis    Past Surgical History:  Procedure Laterality Date   CARDIAC SURGERY     IR THORACENTESIS ASP PLEURAL SPACE W/IMG GUIDE  09/29/2023   IR THORACENTESIS ASP PLEURAL SPACE W/IMG GUIDE  12/16/2023   RIGHT/LEFT HEART CATH AND CORONARY ANGIOGRAPHY N/A 05/21/2023   Procedure: RIGHT/LEFT HEART CATH AND CORONARY ANGIOGRAPHY;  Surgeon: Thukkani, Arun K, MD;  Location: [facility];  Service: Cardiovascular;  Laterality: N/A;    FAMILY HISTORY Family History  Problem Relation Age of Onset   Stroke Father    Glaucoma Father    SOCIAL HISTORY Social History   Tobacco Use   Smoking status: Former   Smokeless tobacco: Never  Advertising account planner  Vaping status: Never Used  Substance Use Topics   Alcohol use: Yes   Drug use: No       OPHTHALMIC EXAM: Not recorded    IMAGING AND PROCEDURES  Imaging and Procedures for 05/17/2024           ASSESSMENT/PLAN:  No diagnosis found.  1. Moderate Non-proliferative diabetic retinopathy w/ DME OU  - s/p IVA OD #1 (11.17.22), #2 (12.15.22), #3 (01.12.23), #4 (02.13.23), #5 (03.16.23), #6 (04.12.23), #7 (05.20.25), #8 (07.01.25), #9 (08.12.25), #10 (09.16.25) - s/p IVA OS #1 (11.14.22), #2 (12.15.22), #3 (01.12.23), #4 (02.13.23), #5 (03.16.23), #6 (04.12.23), #7 (05.12.23), #8  (06.09.23), #9 (02.18.25), #10 (05.20.25), #11 (07.01.25), #12 (09.16.25) ================================ - s/p IVE OD #1 (05.12.23) -- sample, #2 (06.09.23), #3 (07.07.23), #4 (08.04.23), #5 (09.22.23), #6 (11.07.23), #7 (11.07.23), #8 (01.30.24), #9 (03.12.24), #10 (05.01.24), #11 (06.19.24), #12 (08.23.24), #13 (11.12.24) - s/p IVE OS #1 (07.07.23), #2 (08.04.23), #3 (09.22.23), #4 (11.07.23),#5 (11.07.23), #6 (01.30.24), #7 (03.12.24), #8 (05.01.24), #9 (06.19.24), #10 (08.23.24), #11 (11.12.24) ===================================== - exam shows scattered MA/DBH OU, +edema and exudates - FA (11.14.22) shows vascular perfusion defects OU, no NV OU, OS with focal macroaneurysm along IT arcades - OCT shows OD: Mild interval improvement in IRF / IRHM temporal macula and fovea at 5 wks; OS: Mild interval increase in IRF/IRHM nasal fovea and mac @ 11 wks since last injxn.  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 Hypertensive retinopathy OU - discussed importance of tight BP control - continue to monitor  5.  Mixed Cataract OU  - being monitored by Dr. [name] - The symptoms of cataract, surgical options, and treatments and risks were discussed with patient.  - discussed diagnosis and progression - seeing Dr. [name] for cataract surgery on 10.09.25 then 10.16.25 (OD then OS)  Ophthalmic Meds Ordered this visit:  No orders of the defined types were placed in this encounter.    No follow-ups on file.  There are no Patient  Instructions on file for this visit.  Explained the diagnoses, plan, and follow up with the patient and they expressed understanding.  Patient expressed understanding of the importance of proper follow up care.    This document serves as a record of services personally performed by Redell JUDITHANN Hans, MD, PhD. It was created on their behalf by Almetta Pesa, an ophthalmic technician. The creation of this record is the provider's dictation and/or activities during the visit.    Electronically signed by: Almetta Pesa, OA, 05/15/24  12:33 PM  Redell JUDITHANN Hans, M.D., Ph.D. Diseases & Surgery of the Retina and Vitreous [facility]   Abbreviations: M myopia (nearsighted); A astigmatism; H hyperopia (farsighted); P presbyopia; Mrx spectacle prescription;  CTL contact lenses; OD right eye; OS left eye; OU both eyes  XT exotropia; ET esotropia; PEK punctate epithelial keratitis; PEE punctate epithelial erosions; DES dry eye syndrome; MGD meibomian gland dysfunction; ATs artificial tears; PFAT's preservative free artificial tears; NSC nuclear  sclerotic cataract; PSC posterior subcapsular cataract; ERM epi-retinal membrane; PVD posterior vitreous detachment; RD retinal detachment; DM diabetes mellitus; DR diabetic retinopathy; NPDR non-proliferative diabetic retinopathy; PDR proliferative diabetic retinopathy; CSME clinically significant macular edema; DME diabetic macular edema; dbh dot blot hemorrhages; CWS cotton wool spot; POAG primary open angle glaucoma; C/D cup-to-disc ratio; HVF humphrey visual field; GVF goldmann visual field; OCT optical coherence tomography; IOP intraocular pressure; BRVO Branch retinal vein occlusion; CRVO central retinal vein occlusion; CRAO central retinal artery occlusion; BRAO branch retinal artery occlusion; RT retinal tear; SB scleral buckle; PPV pars plana vitrectomy; VH Vitreous hemorrhage; PRP panretinal laser photocoagulation; IVK intravitreal  kenalog; VMT vitreomacular traction; MH Macular hole;  NVD neovascularization of the disc; NVE neovascularization elsewhere; AREDS age related eye disease study; ARMD age related macular degeneration; POAG primary open angle glaucoma; EBMD epithelial/anterior basement membrane dystrophy; ACIOL anterior chamber intraocular lens; IOL intraocular lens; PCIOL posterior chamber intraocular lens; Phaco/IOL phacoemulsification with intraocular lens placement; PRK photorefractive keratectomy; LASIK laser assisted in situ keratomileusis; HTN hypertension; DM diabetes mellitus; COPD chronic obstructive pulmonary disease

## 2024-05-16 ENCOUNTER — Encounter (INDEPENDENT_AMBULATORY_CARE_PROVIDER_SITE_OTHER): Admitting: Ophthalmology

## 2024-05-17 ENCOUNTER — Encounter (INDEPENDENT_AMBULATORY_CARE_PROVIDER_SITE_OTHER): Admitting: Ophthalmology

## 2024-05-17 ENCOUNTER — Ambulatory Visit (INDEPENDENT_AMBULATORY_CARE_PROVIDER_SITE_OTHER): Admitting: Ophthalmology

## 2024-05-17 ENCOUNTER — Encounter (INDEPENDENT_AMBULATORY_CARE_PROVIDER_SITE_OTHER): Payer: Self-pay | Admitting: Ophthalmology

## 2024-05-17 DIAGNOSIS — H25813 Combined forms of age-related cataract, bilateral: Secondary | ICD-10-CM

## 2024-05-17 DIAGNOSIS — H35033 Hypertensive retinopathy, bilateral: Secondary | ICD-10-CM

## 2024-05-17 DIAGNOSIS — H35012 Changes in retinal vascular appearance, left eye: Secondary | ICD-10-CM

## 2024-05-17 DIAGNOSIS — E113313 Type 2 diabetes mellitus with moderate nonproliferative diabetic retinopathy with macular edema, bilateral: Secondary | ICD-10-CM | POA: Diagnosis not present

## 2024-05-17 DIAGNOSIS — I10 Essential (primary) hypertension: Secondary | ICD-10-CM

## 2024-05-17 DIAGNOSIS — Z961 Presence of intraocular lens: Secondary | ICD-10-CM

## 2024-05-17 NOTE — Progress Notes (Shared)
 [facility] - Clinic Note  05/17/2024    CHIEF COMPLAINT Patient presents for Retina Follow Up  HISTORY OF PRESENT ILLNESS: Chad [name] is a 78 y.o. male who presents to the clinic today for:   HPI     Retina Follow Up   Patient presents with  Diabetic Retinopathy.  In both eyes.  This started 5 weeks ago.        Comments   Patient here for 5 weeks retina follow up for NPDR OU. Patient states had cataract surgery OS 2 weeks ago and OD last week. Using drops. 2 different drops OS and 4 different drops OD. No eye pain.       Last edited by [name] RAMAN, COA on 05/17/2024  2:40 PM.      Pt states he just had cataract surgery OU in October w/ Dr. [name]. VA has improved.   Referring physician: [name], MD [address] [place],  [place] [address]  HISTORICAL INFORMATION:   Selected notes from the MEDICAL RECORD NUMBER Referred by Dr. [name] for diabetic retinopathy with DME OU [name]:  Ocular Hx- PMH-    CURRENT MEDICATIONS: No current outpatient medications on file. (Ophthalmic Drugs)   No current facility-administered medications for this visit. (Ophthalmic Drugs)   Current Outpatient Medications (Other)  Medication Sig   amLODipine  (NORVASC ) 5 MG tablet Take 1 tablet (5 mg total) by mouth daily.   aspirin  EC 81 MG tablet Take 81 mg by mouth daily. Swallow whole.   B-D ULTRAFINE III SHORT PEN 31G X 8 MM MISC SMARTSIG:1 Each SUB-Q Daily   cyanocobalamin  (VITAMIN B12) 1000 MCG tablet Take 1 tablet (1,000 mcg total) by mouth daily.   gabapentin  (NEURONTIN ) 300 MG capsule Take 2 capsules (600 mg total) by mouth 2 (two) times daily.   insulin  glargine (LANTUS ) 100 UNIT/ML injection Inject 14 Units into the skin at bedtime.   JARDIANCE  10 MG TABS tablet Take 10 mg by mouth daily.   levothyroxine  (SYNTHROID ) 100 MCG tablet Take 1 tablet (100 mcg total) by mouth daily before breakfast.   metoprolol  succinate (TOPROL  XL) 25 MG 24  hr tablet Take 0.5 tablets (12.5 mg total) by mouth daily.   nitroGLYCERIN  (NITROSTAT ) 0.4 MG SL tablet Place 1 tablet (0.4 mg total) under the tongue every 5 (five) minutes as needed for chest pain.   ONETOUCH VERIO test strip    polyethylene glycol (MIRALAX  / GLYCOLAX ) 17 g packet Take 17 g by mouth as needed for moderate constipation.   rosuvastatin  (CRESTOR ) 20 MG tablet Take 20 mg by mouth daily.   spironolactone (ALDACTONE) 25 MG tablet Take 12.5 mg by mouth daily.   No current facility-administered medications for this visit. (Other)   REVIEW OF SYSTEMS: ROS   Positive for: Endocrine, Cardiovascular, Eyes Negative for: Constitutional, Gastrointestinal, Neurological, Skin, Genitourinary, Musculoskeletal, HENT, Respiratory, Psychiatric, Allergic/Imm, Heme/Lymph Last edited by [name] RAMAN, COA on 05/17/2024  2:40 PM.      ALLERGIES Allergies  Allergen Reactions   Porcine (Pork) Protein-Containing Drug Products Other (See Comments)    PAST MEDICAL HISTORY Past Medical History:  Diagnosis Date   Cataract    CKD (chronic kidney disease)    Colon polyps    Diabetes (HCC)    Diabetic polyneuropathy associated with type 2 diabetes mellitus (HCC) 01/15/2017   Diabetic retinopathy (HCC)    GERD (gastroesophageal reflux disease)    Hypercholesterolemia    Hypertension  Hypertensive retinopathy    Hypothyroidism    Polyneuropathy    Proteinuria    Severe aortic stenosis    Past Surgical History:  Procedure Laterality Date   CARDIAC SURGERY     IR THORACENTESIS ASP PLEURAL SPACE W/IMG GUIDE  09/29/2023   IR THORACENTESIS ASP PLEURAL SPACE W/IMG GUIDE  12/16/2023   RIGHT/LEFT HEART CATH AND CORONARY ANGIOGRAPHY N/A 05/21/2023   Procedure: RIGHT/LEFT HEART CATH AND CORONARY ANGIOGRAPHY;  Surgeon: [name], MD;  Location: [facility];  Service: Cardiovascular;  Laterality: N/A;    FAMILY HISTORY Family History  Problem Relation Age of Onset   Stroke  Father    Glaucoma Father    SOCIAL HISTORY Social History   Tobacco Use   Smoking status: Former   Smokeless tobacco: Never  Advertising account planner   Vaping status: Never Used  Substance Use Topics   Alcohol use: Yes   Drug use: No       OPHTHALMIC EXAM: Base Eye Exam     Visual Acuity (Snellen - Linear)       Right Left   Dist [place] 20/30 20/20 -1   Dist ph [place] 20/20          Tonometry (Tonopen, 2:38 PM)       Right Left   Pressure 12 13         Pupils       Dark Light Shape React APD   Right 3 2 Round Brisk None   Left 3 2 Round Brisk None         Visual Fields (Counting fingers)       Left Right    Full Full         Extraocular Movement       Right Left    Full, Ortho Full, Ortho         Neuro/Psych     Oriented x3: Yes   Mood/Affect: Normal         Dilation     Both eyes: 1.0% Mydriacyl, 2.5% Phenylephrine @ 2:38 PM           Slit Lamp and Fundus Exam     External Exam       Right Left   External Normal Normal         Slit Lamp Exam       Right Left   Lids/Lashes Dermatochalasis - upper lid, mild Telangiectasia Dermatochalasis - upper lid, Meibomian gland dysfunction   Conjunctiva/Sclera White and quiet White and quiet   Cornea 2+ fine, inferior Punctate epithelial erosions 1+ Punctate epithelial erosions, mild tear film debris   Anterior Chamber deep, clear, narrow angles deep, clear, narrow angles   Iris Round and dilated, mild anterior bowing, No NVI Round and dilated, mild anterior bowing, No NVI   Lens 3+ Nuclear sclerosis w/ brunescence, 3+ Cortical cataract 3+ Nuclear sclerosis w/ brunescence, 3+ Cortical cataract   Anterior Vitreous Vitreous syneresis Vitreous syneresis         Fundus Exam       Right Left   Disc Pink and Sharp, no NVD Pink and Sharp, no NVD   C/D Ratio 0.3 0.3   Macula good foveal reflex, mild ERM, scattered MA and exudates-- stably improved, central cystic changes--improved. Flat, good foveal  reflex, minimal MA and focal exudates, trace cystic changes--slightly increased   Vessels attenuated, Tortuous attenuated, Tortuous, Copper  wiring, Macroaneurysm / CWS along IT arcades - improving   Periphery Attached, scattered  MA and CWS greatest posteriorly Attached, scattered MA/DBH greatest posteriorly, focal CWS inferior to IT arcades--fading           IMAGING AND PROCEDURES  Imaging and Procedures for 05/17/2024  OCT, Retina - OU - Both Eyes       Right Eye Quality was good. Central Foveal Thickness: 361. Progression has worsened. Findings include normal foveal contour, no SRF, intraretinal hyper-reflective material, epiretinal membrane, intraretinal fluid, macular pucker (Mild interval increase in IRF / IRHM temporal macula and fovea ).   Left Eye Quality was good. Central Foveal Thickness: 271. Progression has improved. Findings include normal foveal contour, no SRF, intraretinal hyper-reflective material, intraretinal fluid (Mild interval improvement in IRF/IRHM nasal fovea and mac. ).   Notes *Images captured and stored on drive  Diagnosis / Impression:  +DME OU OD: Mild interval increase in IRF / IRHM temporal macula and fovea  OS: Mild interval improvement in IRF/IRHM nasal fovea and mac.    Clinical management:  See below  Abbreviations: NFP - Normal foveal profile. CME - cystoid macular edema. PED - pigment epithelial detachment. IRF - intraretinal fluid. SRF - subretinal fluid. EZ - ellipsoid zone. ERM - epiretinal membrane. ORA - outer retinal atrophy. ORT - outer retinal tubulation. SRHM - subretinal hyper-reflective material. IRHM - intraretinal hyper-reflective material      Intravitreal Injection, Pharmacologic Agent - OD - Right Eye       Time Out 05/17/2024. 3:48 PM. Confirmed correct patient, procedure, site, and patient consented.   Anesthesia Topical anesthesia was used. Anesthetic medications included Lidocaine  2%, Proparacaine 0.5%.    Procedure Preparation included 5% betadine to ocular surface, eyelid speculum. A supplied (32g) needle was used.   Injection: 1.25 mg Bevacizumab  1.25mg /0.66ml   Route: Intravitreal, Site: Right Eye   NDC: [id number], Lot: [id number], Expiration date: 05/28/2024   Post-op Post injection exam found visual acuity of at least counting fingers. The patient tolerated the procedure well. There were no complications. The patient received written and verbal post procedure care education. Post injection medications were not given.              ASSESSMENT/PLAN:    ICD-10-CM   1. Moderate nonproliferative diabetic retinopathy of both eyes with macular edema associated with type 2 diabetes mellitus (HCC)  E11.3313 OCT, Retina - OU - Both Eyes    Intravitreal Injection, Pharmacologic Agent - OD - Right Eye    2. 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 Essential hypertension  I10     4. Hypertensive retinopathy of both eyes  H35.033     5. Combined forms of age-related cataract of both eyes  H25.813       1. Moderate Non-proliferative diabetic retinopathy w/ DME OU - s/p IVA OD #1 (11.17.22), #2 (12.15.22), #3 (01.12.23), #4 (02.13.23), #5 (03.16.23), #6 (04.12.23), #7 (05.20.25), #8 (07.01.25), #9 (08.12.25), #10 (09.16.25) - s/p IVA OS #1 (11.14.22), #2 (12.15.22), #3 (01.12.23), #4 (02.13.23), #5 (03.16.23), #6 (04.12.23), #7 (05.12.23), #8 (06.09.23), #9 (02.18.25), #10 (05.20.25), #11 (07.01.25), #12 (09.16.25) ================================ - s/p IVE OD #1 (05.12.23) -- sample, #2 (06.09.23), #3 (07.07.23), #4 (08.04.23), #5 (09.22.23), #6 (11.07.23), #7 (11.07.23), #8 (01.30.24), #9 (03.12.24), #10 (05.01.24), #11 (06.19.24), #12 (08.23.24), #13 (11.12.24) - s/p IVE OS #1 (07.07.23), #2 (08.04.23), #3 (09.22.23), #4 (11.07.23),#5 (11.07.23), #6 (01.30.24), #7 (03.12.24), #8 (05.01.24), #9 (06.19.24), #10 (08.23.24), #11 (11.12.24) ===================================== -  exam shows scattered MA/DBH OU, +edema and exudates - FA (11.14.22) shows vascular  perfusion defects OU, no NV OU, OS with focal macroaneurysm along IT arcades - OCT shows OD: Mild interval increase in IRF / IRHM temporal macula and fovea; OS: Mild interval improvement in IRF/IRHM nasal fovea and mac @ 5 weeks  - BCVA OU 20/20-improved from 20/30 - recommend IVA OD #11 today 10.22.25 w/ f/u at 4-5 weeks--holding IVA OS today.  - pt wishes to proceed with injection OD--working plan will be IVA OS ~every other visit - RBA of procedure discussed, questions answered - IVA informed consent obtained and re-signed OU, 02.18.25 - see procedure note  - no funding for Good Days - f/u in 5 weeks, DFE, OCT, possible injection(s)  2. Retinal macroaneurysm OS  - focal lesion along IT arcades -- regressed  - s/p IVA OS as above  3,4. Hypertensive retinopathy OU - discussed importance of tight BP control - continue to monitor  5. Pseudophakia OU  - s/p CE/IOL 10.09.25- OD; 10.16.25- OS w/ Dr. [name]   - IOL in good position, doing well  - monitor      Ophthalmic Meds Ordered this visit:  No orders of the defined types were placed in this encounter.    Return for 4-5 weeks NPDR OU, DFE, OCT.  There are no Patient Instructions on file for this visit.  Explained the diagnoses, plan, and follow up with the patient and they expressed understanding.  Patient expressed understanding of the importance of proper follow up care.    This document serves as a record of services personally performed by Redell JUDITHANN Hans, MD, PhD. It was created on their behalf by Almetta Pesa, an ophthalmic technician. The creation of this record is the provider's dictation and/or activities during the visit.    Electronically signed by: Almetta Pesa, OA, 05/17/24  3:53 PM  This document serves as a record of services personally performed by Redell JUDITHANN Hans, MD, PhD. It was created on their behalf by Wanda GEANNIE Keens, COT an ophthalmic technician. The creation of this record is the provider's dictation and/or activities during the visit.    Electronically signed by:  Wanda GEANNIE Keens, COT  05/17/24 3:53 PM  Redell JUDITHANN Hans, M.D., Ph.D. Diseases & Surgery of the Retina and Vitreous [facility]   Abbreviations: M myopia (nearsighted); A astigmatism; H hyperopia (farsighted); P presbyopia; Mrx spectacle prescription;  CTL contact lenses; OD right eye; OS left eye; OU both eyes  XT exotropia; ET esotropia; PEK punctate epithelial keratitis; PEE punctate epithelial erosions; DES dry eye syndrome; MGD meibomian gland dysfunction; ATs artificial tears; PFAT's preservative free artificial tears; NSC nuclear sclerotic cataract; PSC posterior subcapsular cataract; ERM epi-retinal membrane; PVD posterior vitreous detachment; RD retinal detachment; DM diabetes mellitus; DR diabetic retinopathy; NPDR non-proliferative diabetic retinopathy; PDR proliferative diabetic retinopathy; CSME clinically significant macular edema; DME diabetic macular edema; dbh dot blot hemorrhages; CWS cotton wool spot; POAG primary open angle glaucoma; C/D cup-to-disc ratio; HVF humphrey visual field; GVF goldmann visual field; OCT optical coherence tomography; IOP intraocular pressure; BRVO Branch retinal vein occlusion; CRVO central retinal vein occlusion; CRAO central retinal artery occlusion; BRAO branch retinal artery occlusion; RT retinal tear; SB scleral buckle; PPV pars plana vitrectomy; VH Vitreous hemorrhage; PRP panretinal laser photocoagulation; IVK intravitreal kenalog; VMT vitreomacular traction; MH Macular hole;  NVD neovascularization of the disc; NVE neovascularization elsewhere; AREDS age related eye disease study; ARMD age related macular degeneration; POAG primary open angle glaucoma; EBMD epithelial/anterior basement membrane dystrophy; ACIOL anterior chamber intraocular  lens; IOL intraocular lens;  PCIOL posterior chamber intraocular lens; Phaco/IOL phacoemulsification with intraocular lens placement; PRK photorefractive keratectomy; LASIK laser assisted in situ keratomileusis; HTN hypertension; DM diabetes mellitus; COPD chronic obstructive pulmonary disease

## 2024-05-18 ENCOUNTER — Other Ambulatory Visit: Payer: Self-pay | Admitting: Hematology and Oncology

## 2024-05-18 ENCOUNTER — Encounter (INDEPENDENT_AMBULATORY_CARE_PROVIDER_SITE_OTHER): Payer: Self-pay | Admitting: Ophthalmology

## 2024-05-18 ENCOUNTER — Inpatient Hospital Stay: Admitting: Hematology and Oncology

## 2024-05-18 ENCOUNTER — Inpatient Hospital Stay: Attending: Hematology and Oncology

## 2024-05-18 VITALS — BP 150/86 | HR 54 | Temp 97.9°F | Resp 14 | Wt 182.6 lb

## 2024-05-18 DIAGNOSIS — E039 Hypothyroidism, unspecified: Secondary | ICD-10-CM | POA: Diagnosis not present

## 2024-05-18 DIAGNOSIS — D696 Thrombocytopenia, unspecified: Secondary | ICD-10-CM | POA: Diagnosis not present

## 2024-05-18 DIAGNOSIS — D631 Anemia in chronic kidney disease: Secondary | ICD-10-CM | POA: Diagnosis not present

## 2024-05-18 DIAGNOSIS — Z7982 Long term (current) use of aspirin: Secondary | ICD-10-CM | POA: Insufficient documentation

## 2024-05-18 DIAGNOSIS — D500 Iron deficiency anemia secondary to blood loss (chronic): Secondary | ICD-10-CM | POA: Diagnosis not present

## 2024-05-18 DIAGNOSIS — Z794 Long term (current) use of insulin: Secondary | ICD-10-CM | POA: Diagnosis not present

## 2024-05-18 DIAGNOSIS — E538 Deficiency of other specified B group vitamins: Secondary | ICD-10-CM | POA: Insufficient documentation

## 2024-05-18 DIAGNOSIS — Z79899 Other long term (current) drug therapy: Secondary | ICD-10-CM | POA: Insufficient documentation

## 2024-05-18 DIAGNOSIS — N189 Chronic kidney disease, unspecified: Secondary | ICD-10-CM | POA: Insufficient documentation

## 2024-05-18 DIAGNOSIS — E1122 Type 2 diabetes mellitus with diabetic chronic kidney disease: Secondary | ICD-10-CM | POA: Diagnosis not present

## 2024-05-18 DIAGNOSIS — I129 Hypertensive chronic kidney disease with stage 1 through stage 4 chronic kidney disease, or unspecified chronic kidney disease: Secondary | ICD-10-CM | POA: Diagnosis not present

## 2024-05-18 DIAGNOSIS — Z87891 Personal history of nicotine dependence: Secondary | ICD-10-CM | POA: Insufficient documentation

## 2024-05-18 LAB — CMP (CANCER CENTER ONLY)
ALT: 11 U/L (ref 0–44)
AST: 14 U/L — ABNORMAL LOW (ref 15–41)
Albumin: 4 g/dL (ref 3.5–5.0)
Alkaline Phosphatase: 54 U/L (ref 38–126)
Anion gap: 7 (ref 5–15)
BUN: 50 mg/dL — ABNORMAL HIGH (ref 8–23)
CO2: 23 mmol/L (ref 22–32)
Calcium: 8.9 mg/dL (ref 8.9–10.3)
Chloride: 108 mmol/L (ref 98–111)
Creatinine: 2.74 mg/dL — ABNORMAL HIGH (ref 0.61–1.24)
GFR, Estimated: 23 mL/min — ABNORMAL LOW (ref >60)
Glucose, Bld: 213 mg/dL — ABNORMAL HIGH (ref 70–99)
Potassium: 4.8 mmol/L (ref 3.5–5.1)
Sodium: 138 mmol/L (ref 135–145)
Total Bilirubin: 0.8 mg/dL (ref 0.0–1.2)
Total Protein: 6.6 g/dL (ref 6.5–8.1)

## 2024-05-18 LAB — CBC WITH DIFFERENTIAL (CANCER CENTER ONLY)
Abs Immature Granulocytes: 0.02 K/uL (ref 0.00–0.07)
Basophils Absolute: 0 K/uL (ref 0.0–0.1)
Basophils Relative: 1 %
Eosinophils Absolute: 0.1 K/uL (ref 0.0–0.5)
Eosinophils Relative: 2 %
HCT: 30.2 % — ABNORMAL LOW (ref 39.0–52.0)
Hemoglobin: 10.7 g/dL — ABNORMAL LOW (ref 13.0–17.0)
Immature Granulocytes: 0 %
Lymphocytes Relative: 28 %
Lymphs Abs: 1.8 K/uL (ref 0.7–4.0)
MCH: 31 pg (ref 26.0–34.0)
MCHC: 35.4 g/dL (ref 30.0–36.0)
MCV: 87.5 fL (ref 80.0–100.0)
Monocytes Absolute: 0.5 K/uL (ref 0.1–1.0)
Monocytes Relative: 8 %
Neutro Abs: 3.7 K/uL (ref 1.7–7.7)
Neutrophils Relative %: 61 %
Platelet Count: 116 K/uL — ABNORMAL LOW (ref 150–400)
RBC: 3.45 MIL/uL — ABNORMAL LOW (ref 4.22–5.81)
RDW: 12.9 % (ref 11.5–15.5)
WBC Count: 6.2 K/uL (ref 4.0–10.5)
nRBC: 0 % (ref 0.0–0.2)

## 2024-05-18 LAB — VITAMIN B12: Vitamin B-12: 1540 pg/mL — ABNORMAL HIGH (ref 180–914)

## 2024-05-18 MED ORDER — BEVACIZUMAB CHEMO INJECTION 1.25MG/0.05ML SYRINGE FOR KALEIDOSCOPE
1.2500 mg | INTRAVITREAL | Status: AC | PRN
  Administered 2024-05-18: 1.25 mg via INTRAVITREAL

## 2024-05-18 NOTE — Progress Notes (Signed)
 [facility] [phone]   [phone]  PROGRESS NOTE  Patient Care Team: [name], MD as PCP - General (Internal Medicine) Delford Maude BROCKS, MD as PCP - Cardiology (Cardiology) Kennyth Chew, MD as PCP - Electrophysiology (Cardiology) Tobie Tonita POUR, DO as Consulting Physician (Neurology)  Hematological/Oncological History # Vitamin B12 Deficiency # Normocytic Anemia 2/2 to CKD  08/23/2023: WBC 13.2, Hgb 8.8, MCV 93, Plt 234  10/20/2023: WBC 6.4, Hgb 10.4, MCV 95, Plt 101  01/06/2024: establish care with Dr. Federico   Interval History:  Chad [name] 78 y.o. male with medical history significant for vitamin b12/normocytic anemia presents for a follow up visit. The patient's last visit was on 01/06/2024. In the interim since the last visit he has continued on vitamin B12 supplementation.  On exam today Chad [name] reports he has been well overall in the interim since our last visit approximate 3 months ago.  He reports he is taking his vitamin B12 pills faithfully and has not had any trouble.  He reports he has not noticed much of a boost in his energy though he does continue to recover from the heart surgery.  He is exercising 4 to 5 days/week and reports his energy today is about 8 out of 10.  His appetite is strong and he is eating quite well.  This morning he had a omelette, cheese, grits, and additional breakfast meats.  He has stopped his spironolactone as well as his glucose strips as he has a monitor.  He reports that overall he feels quite well and has no additional questions concerns or complaints today.  Full 10 point ROS otherwise negative.  MEDICAL HISTORY:  Past Medical History:  Diagnosis Date   Cataract    CKD (chronic kidney disease)    Colon polyps    Diabetes (HCC)    Diabetic polyneuropathy associated with type 2 diabetes mellitus (HCC) 01/15/2017   Diabetic retinopathy (HCC)    GERD (gastroesophageal reflux disease)     Hypercholesterolemia    Hypertension    Hypertensive retinopathy    Hypothyroidism    Polyneuropathy    Proteinuria    Severe aortic stenosis     SURGICAL HISTORY: Past Surgical History:  Procedure Laterality Date   CARDIAC SURGERY     IR THORACENTESIS ASP PLEURAL SPACE W/IMG GUIDE  09/29/2023   IR THORACENTESIS ASP PLEURAL SPACE W/IMG GUIDE  12/16/2023   RIGHT/LEFT HEART CATH AND CORONARY ANGIOGRAPHY N/A 05/21/2023   Procedure: RIGHT/LEFT HEART CATH AND CORONARY ANGIOGRAPHY;  Surgeon: Thukkani, Arun K, MD;  Location: [facility];  Service: Cardiovascular;  Laterality: N/A;    SOCIAL HISTORY: Social History   Socioeconomic History   Marital status: Married    Spouse name: Not on file   Number of children: 2   Years of education: college   Highest education level: Not on file  Occupational History   Occupation: Health visitor  Tobacco Use   Smoking status: Former   Smokeless tobacco: Never  Advertising account planner   Vaping status: Never Used  Substance and Sexual Activity   Alcohol use: Yes   Drug use: No   Sexual activity: Not Currently  Other Topics Concern   Not on file  Social History Narrative   Patient lives with wife in a 2 story home.  Has 2 children.  Works as a Health visitor.  Education: college.       Right handed   Social Drivers of Health  Financial Resource Strain: Low Risk  (08/11/2023)   Received from [facility]   Overall Financial Resource Strain (CARDIA)    Difficulty of Paying Living Expenses: Not hard at all  Food Insecurity: No Food Insecurity (01/06/2024)   Hunger Vital Sign    Worried About Running Out of Food in the Last Year: Never true    Ran Out of Food in the Last Year: Never true  Transportation Needs: No Transportation Needs (09/29/2023)   PRAPARE - Administrator, Civil Service (Medical): No    Lack of Transportation (Non-Medical): No  Physical Activity: Not on file  Stress: Not on  file  Social Connections: Socially Integrated (09/29/2023)   Social Connection and Isolation Panel    Frequency of Communication with Friends and Family: More than three times a week    Frequency of Social Gatherings with Friends and Family: More than three times a week    Attends Religious Services: More than 4 times per year    Active Member of [employer] or Organizations: No    Attends Engineer, structural: 1 to 4 times per year    Marital Status: Married  Catering manager Violence: Not At Risk (01/06/2024)   Humiliation, Afraid, Rape, and Kick questionnaire    Fear of Current or Ex-Partner: No    Emotionally Abused: No    Physically Abused: No    Sexually Abused: No    FAMILY HISTORY: Family History  Problem Relation Age of Onset   Stroke Father    Glaucoma Father     ALLERGIES:  is allergic to porcine (pork) protein-containing drug products.  MEDICATIONS:  Current Outpatient Medications  Medication Sig Dispense Refill   amLODipine  (NORVASC ) 5 MG tablet Take 1 tablet (5 mg total) by mouth daily. 30 tablet 1   aspirin  EC 81 MG tablet Take 81 mg by mouth daily. Swallow whole.     B-D ULTRAFINE III SHORT PEN 31G X 8 MM MISC SMARTSIG:1 Each SUB-Q Daily     cyanocobalamin  (VITAMIN B12) 1000 MCG tablet Take 1 tablet (1,000 mcg total) by mouth daily. 90 tablet 1   gabapentin  (NEURONTIN ) 300 MG capsule Take 2 capsules (600 mg total) by mouth 2 (two) times daily. 360 capsule 0   insulin  glargine (LANTUS ) 100 UNIT/ML injection Inject 14 Units into the skin at bedtime.     JARDIANCE  10 MG TABS tablet Take 10 mg by mouth daily.     levothyroxine  (SYNTHROID ) 100 MCG tablet Take 1 tablet (100 mcg total) by mouth daily before breakfast. 30 tablet 0   metoprolol  succinate (TOPROL  XL) 25 MG 24 hr tablet Take 0.5 tablets (12.5 mg total) by mouth daily. 45 tablet 3   nitroGLYCERIN  (NITROSTAT ) 0.4 MG SL tablet Place 1 tablet (0.4 mg total) under the tongue every 5 (five) minutes as needed for  chest pain. 25 tablet 3   ONETOUCH VERIO test strip      polyethylene glycol (MIRALAX  / GLYCOLAX ) 17 g packet Take 17 g by mouth as needed for moderate constipation.     rosuvastatin  (CRESTOR ) 20 MG tablet Take 20 mg by mouth daily.     spironolactone (ALDACTONE) 25 MG tablet Take 12.5 mg by mouth daily.     No current facility-administered medications for this visit.    REVIEW OF SYSTEMS:   Constitutional: ( - ) fevers, ( - )  chills , ( - ) night sweats Eyes: ( - ) blurriness of vision, ( - ) double  vision, ( - ) watery eyes Ears, nose, mouth, throat, and face: ( - ) mucositis, ( - ) sore throat Respiratory: ( - ) cough, ( - ) dyspnea, ( - ) wheezes Cardiovascular: ( - ) palpitation, ( - ) chest discomfort, ( - ) lower extremity swelling Gastrointestinal:  ( - ) nausea, ( - ) heartburn, ( - ) change in bowel habits Skin: ( - ) abnormal skin rashes Lymphatics: ( - ) new lymphadenopathy, ( - ) easy bruising Neurological: ( - ) numbness, ( - ) tingling, ( - ) new weaknesses Behavioral/Psych: ( - ) mood change, ( - ) new changes  All other systems were reviewed with the patient and are negative.  PHYSICAL EXAMINATION:  Vitals:   05/18/24 1452  BP: (!) 150/86  Pulse: (!) 54  Resp: 14  Temp: 97.9 F (36.6 C)  SpO2: 100%   Filed Weights   05/18/24 1452  Weight: 182 lb 9.6 oz (82.8 kg)    GENERAL: Well-appearing elderly Caucasian male, alert, no distress and comfortable SKIN: skin color, texture, turgor are normal, no rashes or significant lesions EYES: conjunctiva are pink and non-injected, sclera clear OROPHARYNX: no exudate, no erythema; lips, buccal mucosa, and tongue normal  HEART: regular rate & rhythm and no murmurs and no lower extremity edema ABDOMEN: soft, non-tender, non-distended, normal bowel sounds Musculoskeletal: no cyanosis of digits and no clubbing  PSYCH: alert & oriented x 3, fluent speech NEURO: no focal motor/sensory deficits  LABORATORY DATA:  I have  reviewed the data as listed    Latest Ref Rng & Units 05/18/2024    2:23 PM 01/06/2024    2:46 PM 10/20/2023    2:09 PM  CBC  WBC 4.0 - 10.5 K/uL 6.2  4.7  6.4   Hemoglobin 13.0 - 17.0 g/dL 89.2  89.8  89.5   Hematocrit 39.0 - 52.0 % 30.2  29.3  32.5   Platelets 150 - 400 K/uL 116  96  101        Latest Ref Rng & Units 05/18/2024    2:23 PM 01/06/2024    2:46 PM 10/20/2023    2:09 PM  CMP  Glucose 70 - 99 mg/dL 786  812  839   BUN 8 - 23 mg/dL 50  47  41   Creatinine 0.61 - 1.24 mg/dL 7.25  7.18  6.44   Sodium 135 - 145 mmol/L 138  136  136   Potassium 3.5 - 5.1 mmol/L 4.8  4.5  4.7   Chloride 98 - 111 mmol/L 108  102  104   CO2 22 - 32 mmol/L 23  23  19    Calcium  8.9 - 10.3 mg/dL 8.9  9.0  8.8   Total Protein 6.5 - 8.1 g/dL 6.6  6.3  6.1   Total Bilirubin 0.0 - 1.2 mg/dL 0.8  1.1  0.5   Alkaline Phos 38 - 126 U/L 54  50  74   AST 15 - 41 U/L 14  17  20    ALT 0 - 44 U/L 11  16  17      Lab Results  Component Value Date   MPROTEIN Not Observed 01/06/2024   Lab Results  Component Value Date   KPAFRELGTCHN 32.6 (H) 01/06/2024   LAMBDASER 25.4 01/06/2024   KAPLAMBRATIO 1.28 01/06/2024   RADIOGRAPHIC STUDIES: Intravitreal Injection, Pharmacologic Agent - OD - Right Eye Result Date: 05/18/2024 Time Out 05/17/2024. 3:48 PM. Confirmed correct patient, procedure, site, and patient consented.  Anesthesia Topical anesthesia was used. Anesthetic medications included Lidocaine  2%, Proparacaine 0.5%. Procedure Preparation included 5% betadine to ocular surface, eyelid speculum. A supplied (32g) needle was used. Injection: 1.25 mg Bevacizumab  1.25mg /0.60ml   Route: Intravitreal, Site: Right Eye   NDC: [id number], Lot: [id number], Expiration date: 05/28/2024 Post-op Post injection exam found visual acuity of at least counting fingers. The patient tolerated the procedure well. There were no complications. The patient received written and verbal post procedure care education. Post injection  medications were not given.   OCT, Retina - OU - Both Eyes Result Date: 05/18/2024 Right Eye Quality was good. Central Foveal Thickness: 361. Progression has worsened. Findings include normal foveal contour, no SRF, intraretinal hyper-reflective material, epiretinal membrane, intraretinal fluid, macular pucker (Mild interval increase in IRF / IRHM temporal macula and fovea ). Left Eye Quality was good. Central Foveal Thickness: 271. Progression has improved. Findings include normal foveal contour, no SRF, intraretinal hyper-reflective material, intraretinal fluid (Mild interval improvement in IRF/IRHM nasal fovea and mac. ). Notes *Images captured and stored on drive Diagnosis / Impression: +DME OU OD: Mild interval increase in IRF / IRHM temporal macula and fovea OS: Mild interval improvement in IRF/IRHM nasal fovea and mac.  Clinical management: See below Abbreviations: NFP - Normal foveal profile. CME - cystoid macular edema. PED - pigment epithelial detachment. IRF - intraretinal fluid. SRF - subretinal fluid. EZ - ellipsoid zone. ERM - epiretinal membrane. ORA - outer retinal atrophy. ORT - outer retinal tubulation. SRHM - subretinal hyper-reflective material. IRHM - intraretinal hyper-reflective material    ASSESSMENT & PLAN Chad [name] 78 y.o. male with medical history significant for vitamin b12/normocytic anemia presents for a follow up visit.   After review of the labs, review of the records, and discussion with the patient the patients findings are most consistent with anemia from CKD with thrombocytopenia of unclear etiology.    Thrombocytopenia is a common condition with a broad differential. The possible etiologies of thrombocytopenia include liver disease, splenomegaly, infectious process, nutritional deficiency, consumption/autoimmune destruction, pseudothrombocytopenia, and bone marrow disorders. Cirrhosis and liver disease the most common causes of moderate thrombocytopenia. Evaluation  should include full hepatitis serologies (Hep B and C) as well as HIV. Imaging of the liver spleen should be performed with an abdominal US  ( if prior imaging is no readily available). Nutritional etiologies should be ruled out with Vitamin b12 and folate testing.  A peripheral blood smear can help determine if there is clumping leading to pseudothrombocytopenia. If no clear etiology can be found would need to consider immune thrombocytopenia (ITP) with consideration of bone marrow biopsy.    #Thrombocytopenia- Mild  # Normocytic Anemia -- Labs today show white blood cell 6.2, hemoglobin 10.7, MCV 87.5, platelets 116. --Vitamin B12 levels currently pending. -- Ruled out multiple myeloma with SPEP and serum free light chains -- If worsening with no clear etiology can be found we will need to pursue a bone marrow biopsy. --likely component of CKD causing the low Hgb.  -- Return to clinic in 6 months or sooner if it appears he requires IM vitamin B12.  No orders of the defined types were placed in this encounter.   All questions were answered. The patient knows to call the clinic with any problems, questions or concerns.  A total of more than 30 minutes were spent on this encounter with face-to-face time and non-face-to-face time, including preparing to see the patient, ordering tests and/or medications, counseling the patient and coordination of care as  outlined above.   Norleen IVAR Kidney, MD Department of Hematology/Oncology [facility] Phone: [phone] Pager: [phone] Email: [email or web address]  05/18/2024 4:30 PM

## 2024-05-22 DIAGNOSIS — I10 Essential (primary) hypertension: Secondary | ICD-10-CM | POA: Diagnosis not present

## 2024-05-22 DIAGNOSIS — N184 Chronic kidney disease, stage 4 (severe): Secondary | ICD-10-CM | POA: Diagnosis not present

## 2024-05-22 DIAGNOSIS — E113313 Type 2 diabetes mellitus with moderate nonproliferative diabetic retinopathy with macular edema, bilateral: Secondary | ICD-10-CM | POA: Diagnosis not present

## 2024-05-23 ENCOUNTER — Ambulatory Visit: Admitting: Cardiology

## 2024-05-23 DIAGNOSIS — N2889 Other specified disorders of kidney and ureter: Secondary | ICD-10-CM | POA: Diagnosis not present

## 2024-05-23 DIAGNOSIS — D225 Melanocytic nevi of trunk: Secondary | ICD-10-CM | POA: Diagnosis not present

## 2024-05-23 DIAGNOSIS — N184 Chronic kidney disease, stage 4 (severe): Secondary | ICD-10-CM | POA: Diagnosis not present

## 2024-05-23 DIAGNOSIS — E1122 Type 2 diabetes mellitus with diabetic chronic kidney disease: Secondary | ICD-10-CM | POA: Diagnosis not present

## 2024-05-23 DIAGNOSIS — L814 Other melanin hyperpigmentation: Secondary | ICD-10-CM | POA: Diagnosis not present

## 2024-05-23 DIAGNOSIS — E785 Hyperlipidemia, unspecified: Secondary | ICD-10-CM | POA: Diagnosis not present

## 2024-05-23 DIAGNOSIS — I129 Hypertensive chronic kidney disease with stage 1 through stage 4 chronic kidney disease, or unspecified chronic kidney disease: Secondary | ICD-10-CM | POA: Diagnosis not present

## 2024-05-23 DIAGNOSIS — L821 Other seborrheic keratosis: Secondary | ICD-10-CM | POA: Diagnosis not present

## 2024-05-25 LAB — METHYLMALONIC ACID, SERUM: Methylmalonic Acid, Quantitative: 285 nmol/L (ref 0–378)

## 2024-05-26 DIAGNOSIS — N184 Chronic kidney disease, stage 4 (severe): Secondary | ICD-10-CM | POA: Diagnosis not present

## 2024-05-26 DIAGNOSIS — E1122 Type 2 diabetes mellitus with diabetic chronic kidney disease: Secondary | ICD-10-CM | POA: Diagnosis not present

## 2024-05-26 DIAGNOSIS — E0840 Diabetes mellitus due to underlying condition with diabetic neuropathy, unspecified: Secondary | ICD-10-CM | POA: Diagnosis not present

## 2024-05-26 DIAGNOSIS — E1140 Type 2 diabetes mellitus with diabetic neuropathy, unspecified: Secondary | ICD-10-CM | POA: Diagnosis not present

## 2024-05-29 NOTE — Progress Notes (Unsigned)
 Electrophysiology Office Note:   Date:  06/01/2024  ID:  Chad [name], DOB 09-20-1945, MRN [id number]  Primary Cardiologist: Maude Emmer, MD Electrophysiologist: Chad Kitty, MD      History of Present Illness:   Chad [name] is a 78 y.o. male with h/o HTN, HLD, CKD IV (baseline Cr 1.8), DM2, AS and CAD s/p CABG and AVR at [facility] on 08/10/23 who is being seen today for evaluation of his atrial fibrillation and ventricular ectopy.   Discussed the use of AI scribe software for clinical note transcription with the patient, who gave verbal consent to proceed. History of Present Illness Chad [name] is a 78 year old male with postoperative atrial fibrillation who presents for a follow-up visit to monitor his heart rhythm. His primary cardiologist is Dr. Emmer.  He underwent surgery in January and subsequently experienced atrial fibrillation while still in the hospital. Initially, he was placed on amiodarone  and a blood thinner, but these medications were discontinued after he showed no further episodes of atrial fibrillation. A two-week heart monitor did not reveal any atrial fibrillation or ventricular tachycardia.  Since the last visit in March, he has been doing well without any symptoms of heart racing, pounding, or fluttering. He monitors his heart rate at home using an electronic blood pressure machine that sends data to his primary care physician. His heart rate typically ranges in the sixties, and he has not observed any significant deviations from this range.  He is currently off amiodarone  and blood thinners but continues to take aspirin  and a low dose of metoprolol  to manage his heart rate.  Review of systems complete and found to be negative unless listed in HPI.   EP Information / Studies Reviewed:    EKG is ordered today. Personal review as below.  EKG Interpretation Date/Time:  Tuesday May 30 2024 10:29:05 EST Ventricular Rate:  49 PR Interval:  166 QRS Duration:  84 QT  Interval:  474 QTC Calculation: 428 R Axis:   90  Text Interpretation: Sinus bradycardia Rightward axis When compared with ECG of 19-Oct-2023 15:47, T wave inversion less evident in Inferior leads Nonspecific T wave abnormality no longer evident in Anterolateral leads Confirmed by [name] Chad [phone]) on 05/30/2024 10:40:12 AM   Zio 11/15/23:  Patch Wear Time:  13 days and 23 hours (2025-03-28T16:10:11-398 to 2025-04-11T16:09:57-0400)   HR 46 - 74, average 55 bpm. There were no sustained or non-sustained arrhythmias.  No atrial fibrillation detected. Rare supraventricular ectopy. Occasional ventricular ectopy, 1.1%. No symptom trigger episodes.  Echo 10/01/23:   1. Left ventricular ejection fraction, by estimation, is 60 to 65%. The  left ventricle has normal function. The left ventricle has no regional  wall motion abnormalities. Left ventricular diastolic parameters were  normal.   2. Right ventricular systolic function is normal. The right ventricular  size is normal.   3. The mitral valve is abnormal. No evidence of mitral valve  regurgitation. No evidence of mitral stenosis.   4. Post AVR with hybrid 25 mm Liva Nova Perceval rapid deployment valve  normal gradients and no PVL. The aortic valve has been repaired/replaced.  Aortic valve regurgitation is not visualized. No aortic stenosis is  present. There is a 25 mm Liva Nova  Perceval rapid deployment/sutureless bovine pericardial valve valve  present in the aortic position. Procedure Date: 08/10/23.   5. The inferior vena cava is normal in size with greater than 50%  respiratory variability, suggesting right atrial pressure of 3 mmHg.  Holter Monitor 08/21/23:  IMPRESSION:  1. The predominant rhythm was Sinus. 2. The Maximum Heart Rate recorded was 232 bpm, 01/26 07:52:53, the Minimum Heart Rate recorded was 47 bpm, 01/29 12:36:49, and the Average Heart Rate was 61 bpm. 3. Rare VE beats with a burden of <1 %. There were 48   occurrences of Ventricular Tachycardia with the Fastest episode 232 bpm, 01/26 07:52:51, and the Longest episode 10 beats, 01/26 07:52:51. 4. Occasional SVE beats with a burden of 2 %. There were 203 occurrences of Supraventricular Tachycardia with the  Fastest episode 147 bpm, 01/25 16:52:37, and the Longest episode 6 beats, 01/26 13:17:39. 5. There was 1 Patient Trigger which was not associated with arrhythmia.   Risk Assessment/Calculations:    CHA2DS2-VASc Score = 5   This indicates a 7.2% annual risk of stroke. The patient's score is based upon: CHF History: 0 HTN History: 1 Diabetes History: 1 Stroke History: 0 Vascular Disease History: 1 Age Score: 2 Gender Score: 0         Physical Exam:   VS:  BP (!) 152/78   Pulse (!) 49   Ht 5' 8 (1.727 m)   Wt 175 lb (79.4 kg)   SpO2 97%   BMI 26.61 kg/m    Wt Readings from Last 3 Encounters:  05/30/24 175 lb (79.4 kg)  05/18/24 182 lb 9.6 oz (82.8 kg)  02/22/24 181 lb 4.8 oz (82.2 kg)     GEN: Well nourished, well developed in no acute distress NECK: No JVD CARDIAC: Bradycardic, regular rhythm.  RESPIRATORY:  Clear to auscultation without rales, wheezing or rhonchi  ABDOMEN: Soft, non-distended EXTREMITIES:  No edema; No deformity   ASSESSMENT AND PLAN:    Assessment & Plan #Post-operative atrial fibrillation: Following cardiac surgery, likely related to inflammation post-surgery but does have risk factors for AF. Symptomatic during episodes. Now off amiodarone . No known recurrences.  #Secondary hypercoagulable state due to atrial fibrillation: CHADSVASC score of 5. He has not been on anti-coagulation since surgery.  -He completed a 2-week Zio monitor earlier this year which showed no atrial fibrillation. -Encouraged patient to find a means to monitor heart rates and rhythm long-term: either wearable device such as a watch or Kardia mobile device. Loop recorder could also be considered if patient cannot find an  alternative.  -Will start oral anti-coagulation if he has any AF.  -Continue metoprolol  XL 12.5mg  daily.   #Ventricular Tachycardia: Reportedly short episodes post cardiac surgery, possibly reperfusion or inflammatory etiology. He had non-sustained episodes on Zio monitor immediately after surgery, longest 10 beats. Now off amiodarone  and mexiletine. No known recurrences.  - No VT episodes on Zio monitor in April. -Encouraged patient to find a means to monitor heart rates and rhythm long-term: either wearable device such as a watch or Kardia mobile device. Loop recorder could also be considered if patient cannot find an alternative.  -Continue metoprolol  XL 12.5mg  daily.   #CAD s/p CABG and AVR at [facility] on 08/10/23: Recovering slowly. Well compensated today. LVEF 60-65%.  Denies chest pain. - Continue aspirin  and rosuvastatin .  Follow-up with primary cardiologist, Dr. Delford  Continue regular follow-up with primary cardiologist, Dr. Delford.  Follow-up with EP as needed.  Signed, Chad Kitty, MD

## 2024-05-30 ENCOUNTER — Ambulatory Visit: Attending: Cardiology | Admitting: Cardiology

## 2024-05-30 ENCOUNTER — Encounter: Payer: Self-pay | Admitting: Cardiology

## 2024-05-30 VITALS — BP 152/78 | HR 49 | Ht 68.0 in | Wt 175.0 lb

## 2024-05-30 DIAGNOSIS — E039 Hypothyroidism, unspecified: Secondary | ICD-10-CM | POA: Diagnosis not present

## 2024-05-30 DIAGNOSIS — D6869 Other thrombophilia: Secondary | ICD-10-CM

## 2024-05-30 DIAGNOSIS — Z952 Presence of prosthetic heart valve: Secondary | ICD-10-CM

## 2024-05-30 DIAGNOSIS — I4891 Unspecified atrial fibrillation: Secondary | ICD-10-CM

## 2024-05-30 DIAGNOSIS — E1122 Type 2 diabetes mellitus with diabetic chronic kidney disease: Secondary | ICD-10-CM | POA: Diagnosis not present

## 2024-05-30 DIAGNOSIS — Z951 Presence of aortocoronary bypass graft: Secondary | ICD-10-CM | POA: Diagnosis not present

## 2024-05-30 DIAGNOSIS — I472 Ventricular tachycardia, unspecified: Secondary | ICD-10-CM

## 2024-05-30 DIAGNOSIS — E1121 Type 2 diabetes mellitus with diabetic nephropathy: Secondary | ICD-10-CM | POA: Diagnosis not present

## 2024-05-30 NOTE — Patient Instructions (Signed)

## 2024-06-01 DIAGNOSIS — E538 Deficiency of other specified B group vitamins: Secondary | ICD-10-CM | POA: Diagnosis not present

## 2024-06-01 DIAGNOSIS — E1121 Type 2 diabetes mellitus with diabetic nephropathy: Secondary | ICD-10-CM | POA: Diagnosis not present

## 2024-06-01 DIAGNOSIS — E039 Hypothyroidism, unspecified: Secondary | ICD-10-CM | POA: Diagnosis not present

## 2024-06-01 DIAGNOSIS — E7800 Pure hypercholesterolemia, unspecified: Secondary | ICD-10-CM | POA: Diagnosis not present

## 2024-06-01 DIAGNOSIS — D696 Thrombocytopenia, unspecified: Secondary | ICD-10-CM | POA: Diagnosis not present

## 2024-06-01 DIAGNOSIS — I10 Essential (primary) hypertension: Secondary | ICD-10-CM | POA: Diagnosis not present

## 2024-06-01 DIAGNOSIS — Z0000 Encounter for general adult medical examination without abnormal findings: Secondary | ICD-10-CM | POA: Diagnosis not present

## 2024-06-01 DIAGNOSIS — E1122 Type 2 diabetes mellitus with diabetic chronic kidney disease: Secondary | ICD-10-CM | POA: Diagnosis not present

## 2024-06-01 DIAGNOSIS — R809 Proteinuria, unspecified: Secondary | ICD-10-CM | POA: Diagnosis not present

## 2024-06-01 DIAGNOSIS — E1142 Type 2 diabetes mellitus with diabetic polyneuropathy: Secondary | ICD-10-CM | POA: Diagnosis not present

## 2024-06-01 DIAGNOSIS — E113313 Type 2 diabetes mellitus with moderate nonproliferative diabetic retinopathy with macular edema, bilateral: Secondary | ICD-10-CM | POA: Diagnosis not present

## 2024-06-01 DIAGNOSIS — N184 Chronic kidney disease, stage 4 (severe): Secondary | ICD-10-CM | POA: Diagnosis not present

## 2024-06-05 NOTE — Progress Notes (Signed)
 [facility] - Clinic Note  06/14/2024    CHIEF COMPLAINT Patient presents for Retina Follow Up  HISTORY OF PRESENT ILLNESS: Chad [name] is a 78 y.o. male who presents to the clinic today for:   HPI     Retina Follow Up   Patient presents with  Diabetic Retinopathy.  In both eyes.  This started 4 weeks ago.  I, the attending physician,  performed the HPI with the patient and updated documentation appropriately.        Comments   Patient here for 4 weeks retina follow up for NPDR OU. Patient states vision doing pretty good. Has had cataract surgery OU. Done with drops. No eye pain.       Last edited by Valdemar Rogue, MD on 06/14/2024 11:51 PM.     Patient feels the vision is doing great.   Referring physician: [name], MD [address] [place],  [place] [address]  HISTORICAL INFORMATION:   Selected notes from the MEDICAL RECORD NUMBER Referred by Dr. [name] for diabetic retinopathy with DME OU [name]:  Ocular Hx- PMH-    CURRENT MEDICATIONS: No current outpatient medications on file. (Ophthalmic Drugs)   No current facility-administered medications for this visit. (Ophthalmic Drugs)   Current Outpatient Medications (Other)  Medication Sig   amLODipine  (NORVASC ) 5 MG tablet Take 1 tablet (5 mg total) by mouth daily.   aspirin  EC 81 MG tablet Take 81 mg by mouth daily. Swallow whole.   B-D ULTRAFINE III SHORT PEN 31G X 8 MM MISC SMARTSIG:1 Each SUB-Q Daily   cyanocobalamin  (VITAMIN B12) 1000 MCG tablet Take 1 tablet (1,000 mcg total) by mouth daily.   gabapentin  (NEURONTIN ) 300 MG capsule Take 1 capsule (300 mg total) by mouth 2 (two) times daily.   insulin  glargine (LANTUS ) 100 UNIT/ML injection Inject 14 Units into the skin at bedtime.   JARDIANCE  10 MG TABS tablet Take 10 mg by mouth daily.   levothyroxine  (SYNTHROID ) 100 MCG tablet Take 1 tablet (100 mcg total) by mouth daily before breakfast.   metoprolol  succinate (TOPROL   XL) 25 MG 24 hr tablet Take 0.5 tablets (12.5 mg total) by mouth daily.   nitroGLYCERIN  (NITROSTAT ) 0.4 MG SL tablet Place 1 tablet (0.4 mg total) under the tongue every 5 (five) minutes as needed for chest pain.   ONETOUCH VERIO test strip    polyethylene glycol (MIRALAX  / GLYCOLAX ) 17 g packet Take 17 g by mouth as needed for moderate constipation.   rosuvastatin  (CRESTOR ) 20 MG tablet Take 20 mg by mouth daily.   No current facility-administered medications for this visit. (Other)   REVIEW OF SYSTEMS: ROS   Positive for: Endocrine, Cardiovascular, Eyes Negative for: Constitutional, Gastrointestinal, Neurological, Skin, Genitourinary, Musculoskeletal, HENT, Respiratory, Psychiatric, Allergic/Imm, Heme/Lymph Last edited by [name] RAMAN, COA on 06/14/2024  1:40 PM.      ALLERGIES Allergies  Allergen Reactions   Porcine (Pork) Protein-Containing Drug Products Other (See Comments)    PAST MEDICAL HISTORY Past Medical History:  Diagnosis Date   Cataract    CKD (chronic kidney disease)    Chad polyps    Diabetes (HCC)    Diabetic polyneuropathy associated with type 2 diabetes mellitus (HCC) 01/15/2017   Diabetic retinopathy (HCC)    GERD (gastroesophageal reflux disease)    Hypercholesterolemia    Hypertension    Hypertensive retinopathy    Hypothyroidism    Polyneuropathy    Proteinuria    Severe aortic  stenosis    Past Surgical History:  Procedure Laterality Date   CARDIAC SURGERY     IR THORACENTESIS ASP PLEURAL SPACE W/IMG GUIDE  09/29/2023   IR THORACENTESIS ASP PLEURAL SPACE W/IMG GUIDE  12/16/2023   RIGHT/LEFT HEART CATH AND CORONARY ANGIOGRAPHY N/A 05/21/2023   Procedure: RIGHT/LEFT HEART CATH AND CORONARY ANGIOGRAPHY;  Surgeon: [name], MD;  Location: [facility];  Service: Cardiovascular;  Laterality: N/A;    FAMILY HISTORY Family History  Problem Relation Age of Onset   Stroke Father    Glaucoma Father    SOCIAL HISTORY Social History    Tobacco Use   Smoking status: Former   Smokeless tobacco: Never  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   Vaping status: Never Used  Substance Use Topics   Alcohol use: Yes   Drug use: No       OPHTHALMIC EXAM: Base Eye Exam     Visual Acuity (Snellen - Linear)       Right Left   Dist [place] 20/25 20/25   Dist ph [place] 20/20 -2 20/20 -2         Tonometry (Tonopen, 1:38 PM)       Right Left   Pressure 18 15         Pupils       Dark Light Shape React APD   Right 3 2 Round Brisk None   Left 3 2 Round Brisk None         Visual Fields (Counting fingers)       Left Right    Full Full         Extraocular Movement       Right Left    Full, Ortho Full, Ortho         Neuro/Psych     Oriented x3: Yes   Mood/Affect: Normal         Dilation     Both eyes: 1.0% Mydriacyl, 2.5% Phenylephrine @ 1:38 PM           Slit Lamp and Fundus Exam     External Exam       Right Left   External Normal Normal         Slit Lamp Exam       Right Left   Lids/Lashes Dermatochalasis - upper lid, mild Telangiectasia Dermatochalasis - upper lid, Meibomian gland dysfunction   Conjunctiva/Sclera White and quiet White and quiet   Cornea Arcus, tear film debris, well healed cataract wound trace tear film debris, Well healed temporal cataract wound   Anterior Chamber deep, clear, narrow angles deep, clear, narrow angles   Iris Round and dilated, mild anterior bowing, No NVI Round and dilated, mild anterior bowing, No NVI   Lens PC IOL in good position PC IOL in good position, trace Posterior capsular opacification   Anterior Vitreous Vitreous syneresis Vitreous syneresis         Fundus Exam       Right Left   Disc Pink and Sharp, no NVD Pink and Sharp, no NVD   C/D Ratio 0.3 0.3   Macula good foveal reflex, mild ERM, scattered MA and exudates-- stably improved, central cystic changes--slightly improved Flat, good foveal reflex, minimal MA and focal exudates, trace cystic changes    Vessels attenuated, Tortuous attenuated, Tortuous, Copper  wiring, Macroaneurysm / CWS along IT arcades - improving   Periphery Attached, scattered MA and CWS greatest posteriorly Attached, scattered MA/DBH greatest posteriorly, focal CWS inferior to IT arcades--fading  IMAGING AND PROCEDURES  Imaging and Procedures for 06/14/2024  OCT, Retina - OU - Both Eyes       Right Eye Quality was good. Central Foveal Thickness: 340. Progression has improved. Findings include normal foveal contour, no SRF, intraretinal hyper-reflective material, epiretinal membrane, intraretinal fluid, macular pucker (Mild interval improvement in IRF / IRHM temporal macula and fovea ).   Left Eye Quality was good. Central Foveal Thickness: 282. Progression has been stable. Findings include normal foveal contour, no SRF, intraretinal hyper-reflective material, intraretinal fluid (Persistent IRF/IRHM nasal fovea and mac - slightly increased ).   Notes *Images captured and stored on drive  Diagnosis / Impression:  +DME OU OD: Mild interval improvement in IRF / IRHM temporal macula and fovea  OS: Persistent IRF/IRHM nasal fovea and mac - slightly increased  Clinical management:  See below  Abbreviations: NFP - Normal foveal profile. CME - cystoid macular edema. PED - pigment epithelial detachment. IRF - intraretinal fluid. SRF - subretinal fluid. EZ - ellipsoid zone. ERM - epiretinal membrane. ORA - outer retinal atrophy. ORT - outer retinal tubulation. SRHM - subretinal hyper-reflective material. IRHM - intraretinal hyper-reflective material      Intravitreal Injection, Pharmacologic Agent - OD - Right Eye       Time Out 06/14/2024. 2:30 PM. Confirmed correct patient, procedure, site, and patient consented.   Anesthesia Topical anesthesia was used. Anesthetic medications included Lidocaine  2%, Proparacaine 0.5%.   Procedure Preparation included 5% betadine to ocular surface, eyelid speculum.  A (32g) needle was used.   Injection: 1.25 mg Bevacizumab  1.25mg /0.21ml   Route: Intravitreal, Site: Right Eye   NDC: H525437, Lot: [id number], Expiration date: 08/31/2024   Post-op Post injection exam found visual acuity of at least counting fingers. The patient tolerated the procedure well. There were no complications. The patient received written and verbal post procedure care education. Post injection medications were not given.      Intravitreal Injection, Pharmacologic Agent - OS - Left Eye       Time Out 06/14/2024. 2:30 PM. Confirmed correct patient, procedure, site, and patient consented.   Anesthesia Topical anesthesia was used. Anesthetic medications included Lidocaine  2%, Proparacaine 0.5%.   Procedure Preparation included 5% betadine to ocular surface, eyelid speculum. A (32g) needle was used.   Injection: 1.25 mg Bevacizumab  1.25mg /0.14ml   Route: Intravitreal, Site: Left Eye   NDC: H525437, Lot: [id number], Expiration date: 09/21/2024   Post-op Post injection exam found visual acuity of at least counting fingers. The patient tolerated the procedure well. There were no complications. The patient received written and verbal post procedure care education. Post injection medications were not given.             ASSESSMENT/PLAN:    ICD-10-CM   1. Moderate nonproliferative diabetic retinopathy of both eyes with macular edema associated with type 2 diabetes mellitus (HCC)  E11.3313 OCT, Retina - OU - Both Eyes    Intravitreal Injection, Pharmacologic Agent - OD - Right Eye    Intravitreal Injection, Pharmacologic Agent - OS - Left Eye    Bevacizumab  (AVASTIN ) SOLN 1.25 mg    Bevacizumab  (AVASTIN ) SOLN 1.25 mg    2. 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 Essential hypertension  I10     4. Hypertensive retinopathy of both eyes  H35.033      1. Moderate Non-proliferative diabetic retinopathy w/ DME OU - s/p IVA OD #1 (11.17.22), #2 (12.15.22), #3  (01.12.23), #4 (02.13.23), #5 (03.16.23), #6 (  04.12.23), #7 (05.20.25), #8 (07.01.25), #9 (08.12.25), #10 (09.16.25), #11 (10.22.25) - s/p IVA OS #1 (11.14.22), #2 (12.15.22), #3 (01.12.23), #4 (02.13.23), #5 (03.16.23), #6 (04.12.23), #7 (05.12.23), #8 (06.09.23), #9 (02.18.25), #10 (05.20.25), #11 (07.01.25), #12 (09.16.25) ================ - s/p IVE OD #1 (05.12.23) -- sample, #2 (06.09.23), #3 (07.07.23), #4 (08.04.23), #5 (09.22.23), #6 (11.07.23), #7 (11.07.23), #8 (01.30.24), #9 (03.12.24), #10 (05.01.24), #11 (06.19.24), #12 (08.23.24), #13 (11.12.24) - s/p IVE OS #1 (07.07.23), #2 (08.04.23), #3 (09.22.23), #4 (11.07.23),#5 (11.07.23), #6 (01.30.24), #7 (03.12.24), #8 (05.01.24), #9 (06.19.24), #10 (08.23.24), #11 (11.12.24) ================ - exam shows scattered MA/DBH OU, +edema and exudates - FA (11.14.22) shows vascular perfusion defects OU, no NV OU, OS with focal macroaneurysm along IT arcades - OCT shows OD: Mild interval improvement in IRF / IRHM temporal macula and fovea at 4 weeks OS: Persistent IRF/IRHM nasal fovea and mac - slightly increased at 8 weeks since last injxn  - BCVA OU 20/20 - stable - recommend IVA OD #12 and OS #13 today 11.19.25 w/ f/u in 4 weeks - pt wishes to proceed with injections OU--working plan is IVA OS ~every other visit - RBA of procedure discussed, questions answered - IVA informed consent obtained and re-signed OU, 02.18.25 - see procedure note  - no funding for [facility] [employer] - f/u in 4 weeks, DFE, OCT, possible injection(s)  2. Retinal macroaneurysm OS  - focal lesion along IT arcades -- regressed  - s/p IVA OS as above  3,4. Hypertensive retinopathy OU - discussed importance of tight BP control - continue to monitor  5. Pseudophakia OU  - s/p CE/IOL OU (Dr. Leslee, OD 10.09.25, OS 10.16.25)   - IOLs in good position, doing well  - monitor      Ophthalmic Meds Ordered this visit:  Meds ordered this encounter  Medications   Bevacizumab   (AVASTIN ) SOLN 1.25 mg   Bevacizumab  (AVASTIN ) SOLN 1.25 mg     Return in about 4 weeks (around 07/12/2024) for f/u, NPDR, DFE, OCT, Possible, IVA.  There are no Patient Instructions on file for this visit.  Explained the diagnoses, plan, and follow up with the patient and they expressed understanding.  Patient expressed understanding of the importance of proper follow up care.    This document serves as a record of services personally performed by Redell JUDITHANN Hans, MD, PhD. It was created on their behalf by Almetta Pesa, an ophthalmic technician. The creation of this record is the provider's dictation and/or activities during the visit.    Electronically signed by: Almetta Pesa, OA, 06/14/24  11:52 PM  This document serves as a record of services personally performed by Redell JUDITHANN Hans, MD, PhD. It was created on their behalf by Wanda GEANNIE Keens, COT an ophthalmic technician. The creation of this record is the provider's dictation and/or activities during the visit.    Electronically signed by:  Wanda GEANNIE Keens, COT  06/14/24 11:52 PM  Redell JUDITHANN Hans, M.D., Ph.D. Diseases & Surgery of the Retina and Vitreous [facility]  I have reviewed the above documentation for accuracy and completeness, and I agree with the above. Redell JUDITHANN Hans, M.D., Ph.D. 06/14/24 11:53 PM   Abbreviations: M myopia (nearsighted); A astigmatism; H hyperopia (farsighted); P presbyopia; Mrx spectacle prescription;  CTL contact lenses; OD right eye; OS left eye; OU both eyes  XT exotropia; ET esotropia; PEK punctate epithelial keratitis; PEE punctate epithelial erosions; DES dry eye syndrome; MGD meibomian gland dysfunction; ATs artificial tears;  PFAT's preservative free artificial tears; NSC nuclear sclerotic cataract; PSC posterior subcapsular cataract; ERM epi-retinal membrane; PVD posterior vitreous detachment; RD retinal detachment; DM diabetes mellitus; DR diabetic  retinopathy; NPDR non-proliferative diabetic retinopathy; PDR proliferative diabetic retinopathy; CSME clinically significant macular edema; DME diabetic macular edema; dbh dot blot hemorrhages; CWS cotton wool spot; POAG primary open angle glaucoma; C/D cup-to-disc ratio; HVF humphrey visual field; GVF goldmann visual field; OCT optical coherence tomography; IOP intraocular pressure; BRVO Branch retinal vein occlusion; CRVO central retinal vein occlusion; CRAO central retinal artery occlusion; BRAO branch retinal artery occlusion; RT retinal tear; SB scleral buckle; PPV pars plana vitrectomy; VH Vitreous hemorrhage; PRP panretinal laser photocoagulation; IVK intravitreal kenalog; VMT vitreomacular traction; MH Macular hole;  NVD neovascularization of the disc; NVE neovascularization elsewhere; AREDS age related eye disease study; ARMD age related macular degeneration; POAG primary open angle glaucoma; EBMD epithelial/anterior basement membrane dystrophy; ACIOL anterior chamber intraocular lens; IOL intraocular lens; PCIOL posterior chamber intraocular lens; Phaco/IOL phacoemulsification with intraocular lens placement; PRK photorefractive keratectomy; LASIK laser assisted in situ keratomileusis; HTN hypertension; DM diabetes mellitus; COPD chronic obstructive pulmonary disease

## 2024-06-12 ENCOUNTER — Encounter: Payer: Self-pay | Admitting: Neurology

## 2024-06-12 ENCOUNTER — Ambulatory Visit: Admitting: Neurology

## 2024-06-12 VITALS — BP 136/68 | HR 59 | Ht 68.0 in | Wt 179.0 lb

## 2024-06-12 DIAGNOSIS — E1142 Type 2 diabetes mellitus with diabetic polyneuropathy: Secondary | ICD-10-CM | POA: Diagnosis not present

## 2024-06-12 MED ORDER — GABAPENTIN 300 MG PO CAPS: 300.0000 mg | ORAL_CAPSULE | Freq: Two times a day (BID) | ORAL | 3 refills | Status: AC

## 2024-06-12 NOTE — Patient Instructions (Signed)
 Reduce gabapentin  300mg  twice daily x 2 weeks.  If your pain does not get worse, you can try skipping the morning dose and continue gabapentin  300mg  at bedtime only.

## 2024-06-12 NOTE — Progress Notes (Signed)
 Follow-up Visit   Date: 06/12/24    MARIOS GAISER MRN: [id number] DOB: Jan 09, 1946   Interim History: Alm DELENA Fish is a 78 y.o. right-handed Caucasian male with well-controlled diabetes mellitus, hypertension, hypothyroidism, and hyperlipidemia returning to the clinic for follow-up of neuropathy.    History of present illness: Initial visit 12/2016:  Starting around 2016, he starting having pins and needle sensation over the soles and toes.  Symptoms are constant and worse with walking. He walks unassisted and has not had any falls. He has NCS/EMG of the legs performed by Dr. Oneita in June 2017 which showed severe sensorimotor polyneuropathy.  He has previously tried gabapentin , but does not recall the dose.  He takes Lyrica  200mg  twice daily and denies any side effects. He was diagnosed with diabetes in 2008 and has been managed with oral medications.  His last HbA1c 6.2.  He has also tried accupuncture.     UPDATE 04/26/2020:    His neuropathy has largely been stable and well-controlled on gabapentin  300mg  TID and nortriptyline  30mg  at bedtime. At his last visit, I increased nortriptyline  back to 30mg  due to worsening paresthesias, which has helped.  He did not experience constipation with medication change.  He continues to have constant low grade tingling, but it is manageable.    He is now on insulin  and tolerating this well, last HbA1c ~ 7.  UPDATE 04/28/2021:  He is here for 1 year follow-up visit.  Neuropathy remains stable and restricted to the feet.  No weakness.   He takes gabapentin  300mg  TID and nortriptyline  30mg  at bedtime.  He no longer hand any pain, only numbness and tingling. He had one mechanical fall while walking hand-in-hand with his wife and she tripped, pulling him down.  Fortunately, they did not injure themselves.  Last HbA1c 5.9.  UPDATE 08/24/2022:  he is here for  year follow-up.  He self- adjusted gabapentin  to 900mg  in the morning and 600mg  at bedtime, but did not  noticed a huge change.  He continues to have numbness/tingling in the feet and sensation that they are asleep.  Wife has noticed mild hand tremor, but patient has not been aware of it. No interval falls.  He walks unassisted.  UPDATE 06/12/2024:  He is here for follow-up visit.  He reports stopping nortriptyline  several months ago, and did not change in pain.  He continues to take gabapentin  600mg  twice daily, but denies any significant change in numbness/tingling.  Neuropathy remains in the feet without extension into the lower legs.  He denies imbalance or falls.  He had open heart surgery in January 2024 and is doing well.    Medications:  Current Outpatient Medications on File Prior to Visit  Medication Sig Dispense Refill   amLODipine  (NORVASC ) 5 MG tablet Take 1 tablet (5 mg total) by mouth daily. 30 tablet 1   aspirin  EC 81 MG tablet Take 81 mg by mouth daily. Swallow whole.     B-D ULTRAFINE III SHORT PEN 31G X 8 MM MISC SMARTSIG:1 Each SUB-Q Daily     cyanocobalamin  (VITAMIN B12) 1000 MCG tablet Take 1 tablet (1,000 mcg total) by mouth daily. 90 tablet 1   gabapentin  (NEURONTIN ) 300 MG capsule Take 2 capsules (600 mg total) by mouth 2 (two) times daily. 360 capsule 0   insulin  glargine (LANTUS ) 100 UNIT/ML injection Inject 14 Units into the skin at bedtime.     JARDIANCE  10 MG TABS tablet Take 10 mg by mouth daily.  levothyroxine  (SYNTHROID ) 100 MCG tablet Take 1 tablet (100 mcg total) by mouth daily before breakfast. 30 tablet 0   metoprolol  succinate (TOPROL  XL) 25 MG 24 hr tablet Take 0.5 tablets (12.5 mg total) by mouth daily. 45 tablet 3   nitroGLYCERIN  (NITROSTAT ) 0.4 MG SL tablet Place 1 tablet (0.4 mg total) under the tongue every 5 (five) minutes as needed for chest pain. 25 tablet 3   ONETOUCH VERIO test strip      polyethylene glycol (MIRALAX  / GLYCOLAX ) 17 g packet Take 17 g by mouth as needed for moderate constipation.     rosuvastatin  (CRESTOR ) 20 MG tablet Take 20 mg by  mouth daily.     No current facility-administered medications on file prior to visit.    Allergies:  Allergies  Allergen Reactions   Porcine (Pork) Protein-Containing Drug Products Other (See Comments)     Vital Signs:  BP 136/68   Pulse (!) 59   Ht 5' 8 (1.727 m)   Wt 179 lb (81.2 kg)   SpO2 97%   BMI 27.22 kg/m   Neurological Exam: MENTAL STATUS including orientation to time, place, person, recent and remote memory, attention span and concentration, language, and fund of knowledge is normal.  Speech is not dysarthric.  CRANIAL NERVES:  Pupils equal round and reactive to light.  Normal conjugate, extra-ocular eye movements in all directions of gaze.      MOTOR:  Motor strength is 5/5 in all extremities.  No pronator drift.  Tone is normal.    MSRs:  Reflexes are 2+/4 throughout, except absent Achilles.  SENSORY:  Reduced vibration distal to ankles bilaterally.   COORDINATION/GAIT: Gait appears stable, stooped, unassisted.   DATA: NCS/EMG of the legs 01/09/2016 performed elsewhere:  Severe sensory and motor polyneuropathy with chronic denervation consistent with patient's known history of diabetes mellitus.    Labs 12/2016:  vitamin B12 298, copper  95, SPEP with IFE A poorly-defined area of restricted protein mobility is detected and is reactive with IgG and Kappa antisera, folate >24.0   IMPRESSION/PLAN: 1.  Peripheral neuropathy contributed by diabetes, stable.   - Previously tried:  Lyrica , nortriptyline   - Reduce gabapentin  to 300mg  twice daily x 2 weeks, if pain is no worse, reduce to gabapentin  300mg    2. 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  If I can answer any additional questions, I would be pleased to do so.    Sincerely,    Izel Eisenhardt K. Tobie, DO

## 2024-06-14 ENCOUNTER — Encounter (INDEPENDENT_AMBULATORY_CARE_PROVIDER_SITE_OTHER): Payer: Self-pay | Admitting: Ophthalmology

## 2024-06-14 ENCOUNTER — Ambulatory Visit (INDEPENDENT_AMBULATORY_CARE_PROVIDER_SITE_OTHER): Admitting: Ophthalmology

## 2024-06-14 DIAGNOSIS — H35012 Changes in retinal vascular appearance, left eye: Secondary | ICD-10-CM | POA: Diagnosis not present

## 2024-06-14 DIAGNOSIS — H35033 Hypertensive retinopathy, bilateral: Secondary | ICD-10-CM

## 2024-06-14 DIAGNOSIS — I10 Essential (primary) hypertension: Secondary | ICD-10-CM | POA: Diagnosis not present

## 2024-06-14 DIAGNOSIS — E113313 Type 2 diabetes mellitus with moderate nonproliferative diabetic retinopathy with macular edema, bilateral: Secondary | ICD-10-CM | POA: Diagnosis not present

## 2024-06-14 MED ORDER — BEVACIZUMAB CHEMO INJECTION 1.25MG/0.05ML SYRINGE FOR KALEIDOSCOPE
1.2500 mg | INTRAVITREAL | Status: AC | PRN
  Administered 2024-06-14: 1.25 mg via INTRAVITREAL

## 2024-06-16 DIAGNOSIS — H6980 Other specified disorders of Eustachian tube, unspecified ear: Secondary | ICD-10-CM | POA: Diagnosis not present

## 2024-06-21 DIAGNOSIS — N184 Chronic kidney disease, stage 4 (severe): Secondary | ICD-10-CM | POA: Diagnosis not present

## 2024-06-21 DIAGNOSIS — E113313 Type 2 diabetes mellitus with moderate nonproliferative diabetic retinopathy with macular edema, bilateral: Secondary | ICD-10-CM | POA: Diagnosis not present

## 2024-06-21 DIAGNOSIS — I10 Essential (primary) hypertension: Secondary | ICD-10-CM | POA: Diagnosis not present

## 2024-06-25 DIAGNOSIS — E1122 Type 2 diabetes mellitus with diabetic chronic kidney disease: Secondary | ICD-10-CM | POA: Diagnosis not present

## 2024-06-25 DIAGNOSIS — N184 Chronic kidney disease, stage 4 (severe): Secondary | ICD-10-CM | POA: Diagnosis not present

## 2024-06-25 DIAGNOSIS — E1140 Type 2 diabetes mellitus with diabetic neuropathy, unspecified: Secondary | ICD-10-CM | POA: Diagnosis not present

## 2024-07-06 NOTE — Progress Notes (Signed)
 [facility] - Clinic Note  07/12/2024    CHIEF COMPLAINT Patient presents for Retina Follow Up  HISTORY OF PRESENT ILLNESS: Chad [name] is a 78 y.o. male who presents to the clinic today for:   HPI     Retina Follow Up   Patient presents with  Diabetic Retinopathy.  In both eyes.  This started 4 weeks ago.  I, the attending physician,  performed the HPI with the patient and updated documentation appropriately.        Comments   Patient here for 4 weeks retina follow up for NPDR OU. Pt denies any changes in [place] since last visit.       Last edited by Valdemar Rogue, MD on 07/12/2024  9:34 PM.     Patient feels the vision is doing great.   Referring physician: [name], MD [address] [place],  [place] [address]  HISTORICAL INFORMATION:   Selected notes from the MEDICAL RECORD NUMBER Referred by Dr. [name] for diabetic retinopathy with DME OU [name]:  Ocular Hx- PMH-    CURRENT MEDICATIONS: No current outpatient medications on file. (Ophthalmic Drugs)   No current facility-administered medications for this visit. (Ophthalmic Drugs)   Current Outpatient Medications (Other)  Medication Sig   amLODipine  (NORVASC ) 5 MG tablet Take 1 tablet (5 mg total) by mouth daily.   aspirin  EC 81 MG tablet Take 81 mg by mouth daily. Swallow whole.   B-D ULTRAFINE III SHORT PEN 31G X 8 MM MISC SMARTSIG:1 Each SUB-Q Daily   cyanocobalamin  (VITAMIN B12) 1000 MCG tablet Take 1 tablet (1,000 mcg total) by mouth daily.   gabapentin  (NEURONTIN ) 300 MG capsule Take 1 capsule (300 mg total) by mouth 2 (two) times daily.   insulin  glargine (LANTUS ) 100 UNIT/ML injection Inject 14 Units into the skin at bedtime.   JARDIANCE  10 MG TABS tablet Take 10 mg by mouth daily.   levothyroxine  (SYNTHROID ) 100 MCG tablet Take 1 tablet (100 mcg total) by mouth daily before breakfast.   metoprolol  succinate (TOPROL  XL) 25 MG 24 hr tablet Take 0.5 tablets (12.5 mg  total) by mouth daily.   nitroGLYCERIN  (NITROSTAT ) 0.4 MG SL tablet Place 1 tablet (0.4 mg total) under the tongue every 5 (five) minutes as needed for chest pain.   ONETOUCH VERIO test strip    polyethylene glycol (MIRALAX  / GLYCOLAX ) 17 g packet Take 17 g by mouth as needed for moderate constipation.   rosuvastatin  (CRESTOR ) 20 MG tablet Take 20 mg by mouth daily.   No current facility-administered medications for this visit. (Other)   REVIEW OF SYSTEMS:    ALLERGIES Allergies  Allergen Reactions   Porcine (Pork) Protein-Containing Drug Products Other (See Comments)    PAST MEDICAL HISTORY Past Medical History:  Diagnosis Date   Cataract    CKD (chronic kidney disease)    Colon polyps    Diabetes (HCC)    Diabetic polyneuropathy associated with type 2 diabetes mellitus (HCC) 01/15/2017   Diabetic retinopathy (HCC)    GERD (gastroesophageal reflux disease)    Hypercholesterolemia    Hypertension    Hypertensive retinopathy    Hypothyroidism    Polyneuropathy    Proteinuria    Severe aortic stenosis    Past Surgical History:  Procedure Laterality Date   CARDIAC SURGERY     IR THORACENTESIS ASP PLEURAL SPACE W/IMG GUIDE  09/29/2023   IR THORACENTESIS ASP PLEURAL SPACE W/IMG GUIDE  12/16/2023   RIGHT/LEFT  HEART CATH AND CORONARY ANGIOGRAPHY N/A 05/21/2023   Procedure: RIGHT/LEFT HEART CATH AND CORONARY ANGIOGRAPHY;  Surgeon: [name], MD;  Location: [facility];  Service: Cardiovascular;  Laterality: N/A;    FAMILY HISTORY Family History  Problem Relation Age of Onset   Stroke Father    Glaucoma Father    SOCIAL HISTORY Social History   Tobacco Use   Smoking status: Former   Smokeless tobacco: Never  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   Vaping status: Never Used  Substance Use Topics   Alcohol use: Yes   Drug use: No       OPHTHALMIC EXAM: Base Eye Exam     Visual Acuity (Snellen - Linear)       Right Left   Dist [place] 20/25 20/20 -3   Dist ph [place] 20/20 -3           Tonometry (Tonopen, 9:21 AM)       Right Left   Pressure 7 8         Pupils       Pupils Dark Light Shape React APD   Right PERRL 3 2 Round Brisk None   Left PERRL 3 2 Round Brisk None         Visual Fields       Left Right    Full Full         Extraocular Movement       Right Left    Full, Ortho Full, Ortho         Neuro/Psych     Oriented x3: Yes   Mood/Affect: Normal         Dilation     Both eyes: 1.0% Mydriacyl, 2.5% Phenylephrine @ 9:21 AM           Slit Lamp and Fundus Exam     External Exam       Right Left   External Normal Normal         Slit Lamp Exam       Right Left   Lids/Lashes Dermatochalasis - upper lid, mild Telangiectasia Dermatochalasis - upper lid, Meibomian gland dysfunction   Conjunctiva/Sclera White and quiet White and quiet   Cornea Arcus, tear film debris, well healed cataract wound trace tear film debris, Well healed temporal cataract wound   Anterior Chamber deep, clear, narrow angles deep, clear, narrow angles   Iris Round and dilated, mild anterior bowing, No NVI Round and dilated, mild anterior bowing, No NVI   Lens PC IOL in good position PC IOL in good position, trace Posterior capsular opacification   Anterior Vitreous Vitreous syneresis Vitreous syneresis         Fundus Exam       Right Left   Disc Pink and Sharp, no NVD Pink and Sharp, no NVD   C/D Ratio 0.3 0.3   Macula good foveal reflex, mild ERM, scattered MA and exudates-- stably improved, central cystic changes--slightly improved Flat, good foveal reflex, minimal MA and focal exudates, trace cystic changes improved centrally   Vessels attenuated, Tortuous attenuated, Tortuous, Copper  wiring, Macroaneurysm / CWS along IT arcades - improving   Periphery Attached, scattered MA and CWS greatest posteriorly Attached, scattered MA/DBH greatest posteriorly, focal CWS inferior to IT arcades--fading           IMAGING AND PROCEDURES   Imaging and Procedures for 07/12/2024  OCT, Retina - OU - Both Eyes       Right Eye Quality was good. Central Foveal  Thickness: 328. Progression has improved. Findings include normal foveal contour, no SRF, intraretinal hyper-reflective material, epiretinal membrane, intraretinal fluid, macular pucker (Mild interval improvement in IRF / IRHM temporal macula and fovea ).   Left Eye Quality was good. Central Foveal Thickness: 272. Progression has improved. Findings include normal foveal contour, no SRF, intraretinal hyper-reflective material, intraretinal fluid (Persistent IRF/IRHM superior mac, improved IN fovea).   Notes *Images captured and stored on drive  Diagnosis / Impression:  +DME OU OD: Mild interval improvement in IRF / IRHM temporal macula and fovea  OS: Persistent IRF/IRHM superior mac, improved IN fovea  Clinical management:  See below  Abbreviations: NFP - Normal foveal profile. CME - cystoid macular edema. PED - pigment epithelial detachment. IRF - intraretinal fluid. SRF - subretinal fluid. EZ - ellipsoid zone. ERM - epiretinal membrane. ORA - outer retinal atrophy. ORT - outer retinal tubulation. SRHM - subretinal hyper-reflective material. IRHM - intraretinal hyper-reflective material      Intravitreal Injection, Pharmacologic Agent - OD - Right Eye       Time Out 07/12/2024. 10:52 AM. Confirmed correct patient, procedure, site, and patient consented.   Anesthesia Topical anesthesia was used. Anesthetic medications included Lidocaine  2%, Proparacaine 0.5%.   Procedure Preparation included 5% betadine to ocular surface, eyelid speculum. A supplied (32g) needle was used.   Injection: 1.25 mg Bevacizumab  1.25mg /0.35ml   Route: Intravitreal, Site: Right Eye   NDC: [id number], Lot: [id number], Expiration date: 08/12/2024   Post-op Post injection exam found visual acuity of at least counting fingers. The patient tolerated the procedure well. There were no  complications. The patient received written and verbal post procedure care education. Post injection medications were not given.            ASSESSMENT/PLAN:    ICD-10-CM   1. Moderate nonproliferative diabetic retinopathy of both eyes with macular edema associated with type 2 diabetes mellitus (HCC)  E11.3313 OCT, Retina - OU - Both Eyes    Intravitreal Injection, Pharmacologic Agent - OD - Right Eye    Bevacizumab  (AVASTIN ) SOLN 1.25 mg    2. 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 Essential hypertension  I10     4. Hypertensive retinopathy of both eyes  H35.033     5. Pseudophakia of both eyes  Z96.1      1. Moderate Non-proliferative diabetic retinopathy w/ DME OU - s/p IVA OD #1 (11.17.22), #2 (12.15.22), #3 (01.12.23), #4 (02.13.23), #5 (03.16.23), #6 (04.12.23), #7 (05.20.25), #8 (07.01.25), #9 (08.12.25), #10 (09.16.25), #11 (10.22.25), #12 (11.19.25) - s/p IVA OS #1 (11.14.22), #2 (12.15.22), #3 (01.12.23), #4 (02.13.23), #5 (03.16.23), #6 (04.12.23), #7 (05.12.23), #8 (06.09.23), #9 (02.18.25), #10 (05.20.25), #11 (07.01.25), #12 (09.16.25), #13 (11.19.25) ================ - s/p IVE OD #1 (05.12.23) -- sample, #2 (06.09.23), #3 (07.07.23), #4 (08.04.23), #5 (09.22.23), #6 (11.07.23), #7 (11.07.23), #8 (01.30.24), #9 (03.12.24), #10 (05.01.24), #11 (06.19.24), #12 (08.23.24), #13 (11.12.24) - s/p IVE OS #1 (07.07.23), #2 (08.04.23), #3 (09.22.23), #4 (11.07.23),#5 (11.07.23), #6 (01.30.24), #7 (03.12.24), #8 (05.01.24), #9 (06.19.24), #10 (08.23.24), #11 (11.12.24) - exam shows scattered MA/DBH OU, +edema and exudates - FA (11.14.22) shows vascular perfusion defects OU, no NV OU, OS with focal macroaneurysm along IT arcades - OCT shows OD: Mild interval improvement in IRF / IRHM temporal macula and fovea at 4 weeks OS: Persistent IRF/IRHM superior mac, improved IN fovea at 4 weeks   - BCVA OU 20/20 - stable - recommend IVA OD #13 and holding OS  today 12.17.25 w/ f/u  in 4-5 weeks - pt wishes to proceed with injection OD--working plan is IVA OS ~every other visit - RBA of procedure discussed, questions answered - IVA informed consent obtained and re-signed OU, 02.18.25 - see procedure note  - no funding for Good Days - f/u in 4-5 weeks, DFE, OCT, possible injection(s)  2. Retinal macroaneurysm OS  - focal lesion along IT arcades -- regressed  - s/p IVA OS as above  3,4. Hypertensive retinopathy OU - discussed importance of tight BP control - continue to monitor  5. Pseudophakia OU  - s/p CE/IOL OU (Dr. Leslee, OD 10.09.25, OS 10.16.25)   - IOLs in good position, doing well  - monitor     Ophthalmic Meds Ordered this visit:  Meds ordered this encounter  Medications   Bevacizumab  (AVASTIN ) SOLN 1.25 mg     Return in about 5 weeks (around 08/16/2024) for f/u, NPDR, DFE, OCT, Possible, IVA, OU.  There are no Patient Instructions on file for this visit.  Explained the diagnoses, plan, and follow up with the patient and they expressed understanding.  Patient expressed understanding of the importance of proper follow up care.    This document serves as a record of services personally performed by Redell JUDITHANN Hans, MD, PhD. It was created on their behalf by Almetta Pesa, an ophthalmic technician. The creation of this record is the provider's dictation and/or activities during the visit.    Electronically signed by: Almetta Pesa, OA, 07/12/2024  9:37 PM  This document serves as a record of services personally performed by Redell JUDITHANN Hans, MD, PhD. It was created on their behalf by Wanda GEANNIE Keens, COT an ophthalmic technician. The creation of this record is the provider's dictation and/or activities during the visit.    Electronically signed by:  Wanda GEANNIE Keens, COT  07/12/2024 9:37 PM  Redell JUDITHANN Hans, M.D., Ph.D. Diseases & Surgery of the Retina and Vitreous [facility]  I have reviewed the above  documentation for accuracy and completeness, and I agree with the above. Redell JUDITHANN Hans, M.D., Ph.D. 07/12/2024 9:39 PM   Abbreviations: M myopia (nearsighted); A astigmatism; H hyperopia (farsighted); P presbyopia; Mrx spectacle prescription;  CTL contact lenses; OD right eye; OS left eye; OU both eyes  XT exotropia; ET esotropia; PEK punctate epithelial keratitis; PEE punctate epithelial erosions; DES dry eye syndrome; MGD meibomian gland dysfunction; ATs artificial tears; PFAT's preservative free artificial tears; NSC nuclear sclerotic cataract; PSC posterior subcapsular cataract; ERM epi-retinal membrane; PVD posterior vitreous detachment; RD retinal detachment; DM diabetes mellitus; DR diabetic retinopathy; NPDR non-proliferative diabetic retinopathy; PDR proliferative diabetic retinopathy; CSME clinically significant macular edema; DME diabetic macular edema; dbh dot blot hemorrhages; CWS cotton wool spot; POAG primary open angle glaucoma; C/D cup-to-disc ratio; HVF humphrey visual field; GVF goldmann visual field; OCT optical coherence tomography; IOP intraocular pressure; BRVO Branch retinal vein occlusion; CRVO central retinal vein occlusion; CRAO central retinal artery occlusion; BRAO branch retinal artery occlusion; RT retinal tear; SB scleral buckle; PPV pars plana vitrectomy; VH Vitreous hemorrhage; PRP panretinal laser photocoagulation; IVK intravitreal kenalog; VMT vitreomacular traction; MH Macular hole;  NVD neovascularization of the disc; NVE neovascularization elsewhere; AREDS age related eye disease study; ARMD age related macular degeneration; POAG primary open angle glaucoma; EBMD epithelial/anterior basement membrane dystrophy; ACIOL anterior chamber intraocular lens; IOL intraocular lens; PCIOL posterior chamber intraocular lens; Phaco/IOL phacoemulsification with intraocular lens placement; PRK photorefractive keratectomy; LASIK laser assisted in situ  keratomileusis; HTN hypertension; DM  diabetes mellitus; COPD chronic obstructive pulmonary disease

## 2024-07-12 ENCOUNTER — Ambulatory Visit (INDEPENDENT_AMBULATORY_CARE_PROVIDER_SITE_OTHER): Admitting: Ophthalmology

## 2024-07-12 ENCOUNTER — Encounter (INDEPENDENT_AMBULATORY_CARE_PROVIDER_SITE_OTHER): Payer: Self-pay | Admitting: Ophthalmology

## 2024-07-12 DIAGNOSIS — H35012 Changes in retinal vascular appearance, left eye: Secondary | ICD-10-CM | POA: Diagnosis not present

## 2024-07-12 DIAGNOSIS — I10 Essential (primary) hypertension: Secondary | ICD-10-CM

## 2024-07-12 DIAGNOSIS — Z961 Presence of intraocular lens: Secondary | ICD-10-CM

## 2024-07-12 DIAGNOSIS — E113313 Type 2 diabetes mellitus with moderate nonproliferative diabetic retinopathy with macular edema, bilateral: Secondary | ICD-10-CM

## 2024-07-12 DIAGNOSIS — H35033 Hypertensive retinopathy, bilateral: Secondary | ICD-10-CM

## 2024-07-12 MED ORDER — BEVACIZUMAB CHEMO INJECTION 1.25MG/0.05ML SYRINGE FOR KALEIDOSCOPE
1.2500 mg | INTRAVITREAL | Status: AC | PRN
  Administered 2024-07-12: 22:00:00 1.25 mg via INTRAVITREAL

## 2024-07-25 ENCOUNTER — Encounter (INDEPENDENT_AMBULATORY_CARE_PROVIDER_SITE_OTHER): Payer: Self-pay | Admitting: Otolaryngology

## 2024-07-25 ENCOUNTER — Ambulatory Visit (INDEPENDENT_AMBULATORY_CARE_PROVIDER_SITE_OTHER): Admitting: Otolaryngology

## 2024-07-25 VITALS — BP 142/68 | HR 60 | Ht 68.0 in | Wt 175.0 lb

## 2024-07-25 DIAGNOSIS — H903 Sensorineural hearing loss, bilateral: Secondary | ICD-10-CM

## 2024-07-25 NOTE — Progress Notes (Addendum)
 Reason for Consult: Hearing loss Referring Physician: Dr. Rexanne Alm Chad [name] is an 78 y.o. male.  HPI: He is here for evaluation of decreased hearing in the right side.  He he said this happened suddenly several months ago.  He went to his audiologist and had a hearing test at the time that the problem started.  He does not have that available today.  He has still decreased in the hearing on the right side.  He did not have an upper respiratory infection and still does not have any nasal symptoms.  He was given Flonase for the problem.  He has had no vertigo.  No tinnitus.  He wears hearing aids and even with the hearing aids still feels like the right side is less than the left.  No neurologic problems.  Past Medical History:  Diagnosis Date   Cataract    CKD (chronic kidney disease)    Colon polyps    Diabetes (HCC)    Diabetic polyneuropathy associated with type 2 diabetes mellitus (HCC) 01/15/2017   Diabetic retinopathy (HCC)    GERD (gastroesophageal reflux disease)    Hypercholesterolemia    Hypertension    Hypertensive retinopathy    Hypothyroidism    Polyneuropathy    Proteinuria    Severe aortic stenosis     Past Surgical History:  Procedure Laterality Date   CARDIAC SURGERY     IR THORACENTESIS ASP PLEURAL SPACE W/IMG GUIDE  09/29/2023   IR THORACENTESIS ASP PLEURAL SPACE W/IMG GUIDE  12/16/2023   RIGHT/LEFT HEART CATH AND CORONARY ANGIOGRAPHY N/A 05/21/2023   Procedure: RIGHT/LEFT HEART CATH AND CORONARY ANGIOGRAPHY;  Surgeon: Chad Lurena POUR, MD;  Location: [facility];  Service: Cardiovascular;  Laterality: N/A;    Family History  Problem Relation Age of Onset   Stroke Father    Glaucoma Father     Social History:  reports that he has quit smoking. He has never used smokeless tobacco. He reports current alcohol use. He reports that he does not use drugs.  Allergies: Allergies[1]   No results found for this or any previous visit (from the past 48  hours).  No results found.  ROS There were no vitals taken for this visit. Physical Exam Constitutional:      Appearance: Normal appearance.  HENT:     Head: Normocephalic and atraumatic.     Right Ear: Tympanic membrane is without lesions and middle ear aerated, ear canal and external ear normal.     Left Ear: Tympanic membrane is without lesions and middle ear aerated, ear canal and external ear normal.     Nose: Nose without deviation of septum.  Turbinates with mild hypertrophy, No significant swelling or masses.     Oral cavity/oropharynx: Mucous membranes are moist. No lesions or masses    Larynx: normal voice. Mirror attempted without success    Eyes:     Extraocular Movements: Extraocular movements intact.     Conjunctiva/sclera: Conjunctivae normal.     Pupils: Pupils are equal, round, and reactive to light.  Cardiovascular:     Rate and Rhythm: Normal rate.  Pulmonary:     Effort: Pulmonary effort is normal.  Musculoskeletal:     Cervical back: Normal range of motion and neck supple. No rigidity.  Lymphadenopathy:     Cervical: No cervical adenopathy or masses.salivary glands without lesions. .     Salivary glands- no mass or swelling Neurological:     Mental Status: He is alert.  CN 2-12 intact. No nystagmus      Assessment/Plan: Right sudden hearing loss-it sounds like he had a sudden sensorineural hearing loss and I need to get the audiogram from his testing at the time of onset.  They will go to the audiologist that they use for the hearing aids and get that sent.  We did discuss steroids.  He is not very interested in proceeding with getting steroids.  He in fact asked if it is fine to just do nothing about this.  He is in agreement to get me the audiogram and then we will further discuss treatment options.  Turns out that the audiogram was actually 2 years ago and shows a right sided mid and high-frequency sensorineural hearing loss at about 50 to 60 dB and the  left ear has the same pattern at about 70 dB.  The left ear was clearly worse back at this time than the right.   I did order another audiogram since we will need that so I am not certain when this hearing loss actually occurred  Norleen Notice 07/25/2024, 1:28 PM         [1]  Allergies Allergen Reactions   Porcine (Pork) Protein-Containing Drug Products Other (See Comments)

## 2024-07-25 NOTE — Addendum Note (Signed)
 Addended by: ROARK NORLEEN HERO on: 07/25/2024 02:20 PM   Modules accepted: Orders

## 2024-08-14 NOTE — Progress Notes (Signed)
 " [facility] - Clinic Note  08/16/2024    CHIEF COMPLAINT Patient presents for Retina Follow Up  HISTORY OF PRESENT ILLNESS: Chad [name] is a 79 y.o. male who presents to the clinic today for:   HPI     Retina Follow Up   Patient presents with  Diabetic Retinopathy.  In both eyes.  This started 5 weeks ago.  Duration of 5 weeks.  Since onset it is stable.  I, the attending physician,  performed the HPI with the patient and updated documentation appropriately.        Comments   5 week retina follow up NPDR OU and IVA OU pt is reporting no vision changes noticed he denies any flashes or floaters his last reading 6.6 this am  A1C 6.8      Last edited by Valdemar Rogue, MD on 08/16/2024 11:47 PM.    Patient feels the vision is doing great.   Referring physician: [name], MD [address] [place],  [place] [address]  HISTORICAL INFORMATION:   Selected notes from the MEDICAL RECORD NUMBER Referred by Dr. [name] for diabetic retinopathy with DME OU [name]:  Ocular Hx- PMH-    CURRENT MEDICATIONS: No current outpatient medications on file. (Ophthalmic Drugs)   No current facility-administered medications for this visit. (Ophthalmic Drugs)   Current Outpatient Medications (Other)  Medication Sig   amLODipine  (NORVASC ) 5 MG tablet Take 1 tablet (5 mg total) by mouth daily.   aspirin  EC 81 MG tablet Take 81 mg by mouth daily. Swallow whole.   B-D ULTRAFINE III SHORT PEN 31G X 8 MM MISC SMARTSIG:1 Each SUB-Q Daily   cyanocobalamin  (VITAMIN B12) 1000 MCG tablet Take 1 tablet (1,000 mcg total) by mouth daily.   gabapentin  (NEURONTIN ) 300 MG capsule Take 1 capsule (300 mg total) by mouth 2 (two) times daily.   insulin  glargine (LANTUS ) 100 UNIT/ML injection Inject 14 Units into the skin at bedtime.   JARDIANCE  10 MG TABS tablet Take 10 mg by mouth daily.   levothyroxine  (SYNTHROID ) 100 MCG tablet Take 1 tablet (100 mcg total) by mouth daily  before breakfast.   metoprolol  succinate (TOPROL  XL) 25 MG 24 hr tablet Take 0.5 tablets (12.5 mg total) by mouth daily.   nitroGLYCERIN  (NITROSTAT ) 0.4 MG SL tablet Place 1 tablet (0.4 mg total) under the tongue every 5 (five) minutes as needed for chest pain.   ONETOUCH VERIO test strip    polyethylene glycol (MIRALAX  / GLYCOLAX ) 17 g packet Take 17 g by mouth as needed for moderate constipation.   rosuvastatin  (CRESTOR ) 20 MG tablet Take 20 mg by mouth daily.   No current facility-administered medications for this visit. (Other)   REVIEW OF SYSTEMS: ROS   Positive for: Endocrine, Cardiovascular, Eyes Negative for: Constitutional, Gastrointestinal, Neurological, Skin, Genitourinary, Musculoskeletal, HENT, Respiratory, Psychiatric, Allergic/Imm, Heme/Lymph Last edited by Resa Delon ORN, COT on 08/16/2024  1:50 PM.     ALLERGIES Allergies  Allergen Reactions   Porcine (Pork) Protein-Containing Drug Products Other (See Comments)   PAST MEDICAL HISTORY Past Medical History:  Diagnosis Date   Cataract    CKD (chronic kidney disease)    Colon polyps    Diabetes (HCC)    Diabetic polyneuropathy associated with type 2 diabetes mellitus (HCC) 01/15/2017   Diabetic retinopathy (HCC)    GERD (gastroesophageal reflux disease)    Hypercholesterolemia    Hypertension    Hypertensive retinopathy    Hypothyroidism  Polyneuropathy    Proteinuria    Severe aortic stenosis    Past Surgical History:  Procedure Laterality Date   CARDIAC SURGERY     IR THORACENTESIS RIGHT ASP PLEURAL SPACE W/IMG GUIDE  09/29/2023   IR THORACENTESIS RIGHT ASP PLEURAL SPACE W/IMG GUIDE  12/16/2023   RIGHT/LEFT HEART CATH AND CORONARY ANGIOGRAPHY N/A 05/21/2023   Procedure: RIGHT/LEFT HEART CATH AND CORONARY ANGIOGRAPHY;  Surgeon: [name], MD;  Location: [facility];  Service: Cardiovascular;  Laterality: N/A;    FAMILY HISTORY Family History  Problem Relation Age of Onset   Stroke  Father    Glaucoma Father    SOCIAL HISTORY Social History   Tobacco Use   Smoking status: Former   Smokeless tobacco: Never  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   Vaping status: Never Used  Substance Use Topics   Alcohol use: Yes   Drug use: No       OPHTHALMIC EXAM: Base Eye Exam     Visual Acuity (Snellen - Linear)       Right Left   Dist [place] 20/25 -2 20/20 -1   Dist ph [place] NI          Tonometry (Tonopen, 1:55 PM)       Right Left   Pressure 17 18         Pupils       Pupils Dark Light Shape React APD   Right PERRL 3 2 Round Brisk None   Left PERRL 3 2 Round Brisk None         Visual Fields       Left Right    Full Full         Extraocular Movement       Right Left    Full, Ortho Full, Ortho         Neuro/Psych     Oriented x3: Yes   Mood/Affect: Normal         Dilation     Both eyes: 2.5% Phenylephrine @ 1:55 PM           Slit Lamp and Fundus Exam     External Exam       Right Left   External Normal Normal         Slit Lamp Exam       Right Left   Lids/Lashes Dermatochalasis - upper lid, mild Telangiectasia Dermatochalasis - upper lid, Meibomian gland dysfunction   Conjunctiva/Sclera White and quiet White and quiet   Cornea Arcus, tear film debris, well healed cataract wound trace tear film debris, Well healed temporal cataract wound   Anterior Chamber deep, clear, narrow angles deep, clear, narrow angles   Iris Round and dilated, mild anterior bowing, No NVI Round and dilated, mild anterior bowing, No NVI   Lens PC IOL in good position PC IOL in good position, trace Posterior capsular opacification   Anterior Vitreous Vitreous syneresis Vitreous syneresis         Fundus Exam       Right Left   Disc Pink and Sharp, no NVD Pink and Sharp, no NVD   C/D Ratio 0.3 0.3   Macula good foveal reflex, mild ERM, scattered MA and exudates-- stably improved, central cystic changes Flat, good foveal reflex, minimal MA and focal exudates, trace  cystic changes slightly increased   Vessels attenuated, Tortuous attenuated, Tortuous, Copper  wiring, Macroaneurysm / CWS along IT arcades - improving   Periphery Attached, scattered MA and CWS greatest posteriorly  Attached, scattered MA/DBH greatest posteriorly, focal CWS inferior to IT arcades--fading           IMAGING AND PROCEDURES  Imaging and Procedures for 08/16/2024  OCT, Retina - OU - Both Eyes       Right Eye Quality was good. Central Foveal Thickness: 338. Progression has been stable. Findings include normal foveal contour, no SRF, intraretinal hyper-reflective material, epiretinal membrane, intraretinal fluid, macular pucker (Persistent IRF / IRHM temporal macula and fovea ).   Left Eye Quality was good. Central Foveal Thickness: 275. Progression has worsened. Findings include normal foveal contour, no SRF, intraretinal hyper-reflective material, intraretinal fluid (Persistent IRF/IRHM superior mac increased IN fovea).   Notes *Images captured and stored on drive  Diagnosis / Impression:  +DME OU OD: Persistent IRF / IRHM temporal macula and fovea  OS: Persistent IRF/IRHM superior mac increased IN fovea  Clinical management:  See below  Abbreviations: NFP - Normal foveal profile. CME - cystoid macular edema. PED - pigment epithelial detachment. IRF - intraretinal fluid. SRF - subretinal fluid. EZ - ellipsoid zone. ERM - epiretinal membrane. ORA - outer retinal atrophy. ORT - outer retinal tubulation. SRHM - subretinal hyper-reflective material. IRHM - intraretinal hyper-reflective material      Intravitreal Injection, Pharmacologic Agent - OD - Right Eye       Time Out 08/16/2024. 2:56 PM. Confirmed correct patient, procedure, site, and patient consented.   Anesthesia Topical anesthesia was used. Anesthetic medications included Lidocaine  2%, Proparacaine 0.5%.   Procedure Preparation included 5% betadine to ocular surface, eyelid speculum. A supplied (32g)  needle was used.   Injection: 1.25 mg Bevacizumab  1.25mg /0.76ml   Route: Intravitreal, Site: Right Eye   NDC: H525437, Lot: [id number]lHYCDniN$$[id number]_wtfDtOwoVtHIBNCDQpzPToaClHYCDniN$ , Expiration date: 09/18/2024   Post-op Post injection exam found visual acuity of at least counting fingers. The patient tolerated the procedure well. There were no complications. The patient received written and verbal post procedure care education. Post injection medications were not given.      Intravitreal Injection, Pharmacologic Agent - OS - Left Eye       Time Out 08/16/2024. 2:56 PM. Confirmed correct patient, procedure, site, and patient consented.   Anesthesia Topical anesthesia was used. Anesthetic medications included Lidocaine  2%, Proparacaine 0.5%.   Procedure Preparation included 5% betadine to ocular surface, eyelid speculum. A (32g) needle was used.   Injection: 1.25 mg Bevacizumab  1.25mg /0.68ml   Route: Intravitreal, Site: Left Eye   NDC: H525437, Lot: [id number], Expiration date: 11/12/2024   Post-op Post injection exam found visual acuity of at least counting fingers. The patient tolerated the procedure well. There were no complications. The patient received written and verbal post procedure care education. Post injection medications were not given.            ASSESSMENT/PLAN:    ICD-10-CM   1. Moderate nonproliferative diabetic retinopathy of both eyes with macular edema associated with type 2 diabetes mellitus (HCC)  E11.3313 OCT, Retina - OU - Both Eyes    Intravitreal Injection, Pharmacologic Agent - OD - Right Eye    Intravitreal Injection, Pharmacologic Agent - OS - Left Eye    Bevacizumab  (AVASTIN ) SOLN 1.25 mg    Bevacizumab  (AVASTIN ) SOLN 1.25 mg    2. 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 Essential hypertension  I10     4. Hypertensive retinopathy of both eyes  H35.033     5. Pseudophakia of both eyes  Z96.1      1. Moderate Non-proliferative  diabetic retinopathy w/ DME OU - s/p IVA  OD #1 (11.17.22), #2 (12.15.22), #3 (01.12.23), #4 (02.13.23), #5 (03.16.23), #6 (04.12.23), #7 (05.20.25), #8 (07.01.25), #9 (08.12.25), #10 (09.16.25), #11 (10.22.25), #12 (11.19.25), #13 (12.17.25) - s/p IVA OS #1 (11.14.22), #2 (12.15.22), #3 (01.12.23), #4 (02.13.23), #5 (03.16.23), #6 (04.12.23), #7 (05.12.23), #8 (06.09.23), #9 (02.18.25), #10 (05.20.25), #11 (07.01.25), #12 (09.16.25), #13 (11.19.25) ================ - s/p IVE OD #1 (05.12.23) -- sample, #2 (06.09.23), #3 (07.07.23), #4 (08.04.23), #5 (09.22.23), #6 (11.07.23), #7 (11.07.23), #8 (01.30.24), #9 (03.12.24), #10 (05.01.24), #11 (06.19.24), #12 (08.23.24), #13 (11.12.24) - s/p IVE OS #1 (07.07.23), #2 (08.04.23), #3 (09.22.23), #4 (11.07.23),#5 (11.07.23), #6 (01.30.24), #7 (03.12.24), #8 (05.01.24), #9 (06.19.24), #10 (08.23.24), #11 (11.12.24) - exam shows scattered MA/DBH OU, +edema and exudates - FA (11.14.22) shows vascular perfusion defects OU, no NV OU, OS with focal macroaneurysm along IT arcades - OCT shows OD: Persistent IRF / IRHM temporal macula and fovea at 5 wks since last IVA OD; OS: Persistent IRF/IRHM superior mac increased IN fovea at 9 weeks since IVA OS   - BCVA OD 20/25, OS 20/20 - stable - recommend IVA OU (OD #14 and OS #14) today 01.21.26 w/ f/u in 4-5 weeks - pt wishes to proceed with injection OU **working plan for OS to be treated every other visit** - RBA of procedure discussed, questions answered - IVA informed consent obtained and re-signed OU, 02.18.25 - see procedure note  - no funding for Good Days - f/u in 4-5 weeks, DFE, OCT, possible injection(s)  2. Retinal macroaneurysm OS  - focal lesion along IT arcades -- regressed  - s/p IVA OS as above  3,4. Hypertensive retinopathy OU - discussed importance of tight BP control - continue to monitor  5. Pseudophakia OU  - s/p CE/IOL OU (Dr. Leslee, OD 10.09.25, OS 10.16.25)   - IOLs in good position, doing well  - monitor     Ophthalmic  Meds Ordered this visit:  Meds ordered this encounter  Medications   Bevacizumab  (AVASTIN ) SOLN 1.25 mg   Bevacizumab  (AVASTIN ) SOLN 1.25 mg     Return for 4-5 weeks NPDR OU, DFE, OCT, likely IVA OD.  There are no Patient Instructions on file for this visit.  Explained the diagnoses, plan, and follow up with the patient and they expressed understanding.  Patient expressed understanding of the importance of proper follow up care.    This document serves as a record of services personally performed by Redell JUDITHANN Hans, MD, PhD. It was created on their behalf by Almetta Pesa, an ophthalmic technician. The creation of this record is the provider's dictation and/or activities during the visit.    Electronically signed by: Almetta Pesa, OA, 08/24/24  3:55 PM  This document serves as a record of services personally performed by Redell JUDITHANN Hans, MD, PhD. It was created on their behalf by Wanda GEANNIE Keens, COT an ophthalmic technician. The creation of this record is the provider's dictation and/or activities during the visit.    Electronically signed by:  Wanda GEANNIE Keens, COT  08/24/24 3:55 PM  Redell JUDITHANN Hans, M.D., Ph.D. Diseases & Surgery of the Retina and Vitreous [facility]  I have reviewed the above documentation for accuracy and completeness, and I agree with the above. Redell JUDITHANN Hans, M.D., Ph.D. 08/24/24 3:55 PM   Abbreviations: M myopia (nearsighted); A astigmatism; H hyperopia (farsighted); P presbyopia; Mrx spectacle prescription;  CTL contact lenses; OD right eye; OS left  eye; OU both eyes  XT exotropia; ET esotropia; PEK punctate epithelial keratitis; PEE punctate epithelial erosions; DES dry eye syndrome; MGD meibomian gland dysfunction; ATs artificial tears; PFAT's preservative free artificial tears; NSC nuclear sclerotic cataract; PSC posterior subcapsular cataract; ERM epi-retinal membrane; PVD posterior vitreous detachment; RD retinal  detachment; DM diabetes mellitus; DR diabetic retinopathy; NPDR non-proliferative diabetic retinopathy; PDR proliferative diabetic retinopathy; CSME clinically significant macular edema; DME diabetic macular edema; dbh dot blot hemorrhages; CWS cotton wool spot; POAG primary open angle glaucoma; C/D cup-to-disc ratio; HVF humphrey visual field; GVF goldmann visual field; OCT optical coherence tomography; IOP intraocular pressure; BRVO Branch retinal vein occlusion; CRVO central retinal vein occlusion; CRAO central retinal artery occlusion; BRAO branch retinal artery occlusion; RT retinal tear; SB scleral buckle; PPV pars plana vitrectomy; VH Vitreous hemorrhage; PRP panretinal laser photocoagulation; IVK intravitreal kenalog; VMT vitreomacular traction; MH Macular hole;  NVD neovascularization of the disc; NVE neovascularization elsewhere; AREDS age related eye disease study; ARMD age related macular degeneration; POAG primary open angle glaucoma; EBMD epithelial/anterior basement membrane dystrophy; ACIOL anterior chamber intraocular lens; IOL intraocular lens; PCIOL posterior chamber intraocular lens; Phaco/IOL phacoemulsification with intraocular lens placement; PRK photorefractive keratectomy; LASIK laser assisted in situ keratomileusis; HTN hypertension; DM diabetes mellitus; COPD chronic obstructive pulmonary disease "

## 2024-08-16 ENCOUNTER — Encounter (INDEPENDENT_AMBULATORY_CARE_PROVIDER_SITE_OTHER): Payer: Self-pay | Admitting: Ophthalmology

## 2024-08-16 ENCOUNTER — Ambulatory Visit (INDEPENDENT_AMBULATORY_CARE_PROVIDER_SITE_OTHER): Admitting: Ophthalmology

## 2024-08-16 DIAGNOSIS — I10 Essential (primary) hypertension: Secondary | ICD-10-CM

## 2024-08-16 DIAGNOSIS — H35012 Changes in retinal vascular appearance, left eye: Secondary | ICD-10-CM | POA: Diagnosis not present

## 2024-08-16 DIAGNOSIS — H35033 Hypertensive retinopathy, bilateral: Secondary | ICD-10-CM | POA: Diagnosis not present

## 2024-08-16 DIAGNOSIS — E113313 Type 2 diabetes mellitus with moderate nonproliferative diabetic retinopathy with macular edema, bilateral: Secondary | ICD-10-CM | POA: Diagnosis not present

## 2024-08-16 DIAGNOSIS — Z961 Presence of intraocular lens: Secondary | ICD-10-CM

## 2024-08-16 MED ORDER — BEVACIZUMAB CHEMO INJECTION 1.25MG/0.05ML SYRINGE FOR KALEIDOSCOPE
1.2500 mg | INTRAVITREAL | Status: AC | PRN
  Administered 2024-08-16: 1.25 mg via INTRAVITREAL

## 2024-09-20 ENCOUNTER — Encounter (INDEPENDENT_AMBULATORY_CARE_PROVIDER_SITE_OTHER): Admitting: Ophthalmology

## 2024-11-16 ENCOUNTER — Inpatient Hospital Stay

## 2024-11-16 ENCOUNTER — Inpatient Hospital Stay: Admitting: Hematology and Oncology

## 2025-06-18 ENCOUNTER — Ambulatory Visit: Admitting: Neurology
